# Patient Record
Sex: Female | Born: 1935
Health system: Southern US, Community
[De-identification: ages and names within clinical notes are randomized; demographics above are authoritative.]

## PROBLEM LIST (undated history)

## (undated) DIAGNOSIS — R011 Cardiac murmur, unspecified: Secondary | ICD-10-CM

## (undated) DIAGNOSIS — B019 Varicella without complication: Secondary | ICD-10-CM

## (undated) DIAGNOSIS — K269 Duodenal ulcer, unspecified as acute or chronic, without hemorrhage or perforation: Secondary | ICD-10-CM

## (undated) DIAGNOSIS — D5912 Cold autoimmune hemolytic anemia: Secondary | ICD-10-CM

## (undated) DIAGNOSIS — M1990 Unspecified osteoarthritis, unspecified site: Secondary | ICD-10-CM

## (undated) DIAGNOSIS — R9389 Abnormal findings on diagnostic imaging of other specified body structures: Secondary | ICD-10-CM

## (undated) DIAGNOSIS — D649 Anemia, unspecified: Secondary | ICD-10-CM

## (undated) DIAGNOSIS — E7800 Pure hypercholesterolemia, unspecified: Secondary | ICD-10-CM

## (undated) DIAGNOSIS — K2981 Duodenitis with bleeding: Secondary | ICD-10-CM

## (undated) DIAGNOSIS — T4145XA Adverse effect of unspecified anesthetic, initial encounter: Secondary | ICD-10-CM

## (undated) DIAGNOSIS — K219 Gastro-esophageal reflux disease without esophagitis: Secondary | ICD-10-CM

## (undated) DIAGNOSIS — J189 Pneumonia, unspecified organism: Secondary | ICD-10-CM

## (undated) DIAGNOSIS — R0600 Dyspnea, unspecified: Secondary | ICD-10-CM

## (undated) DIAGNOSIS — I10 Essential (primary) hypertension: Secondary | ICD-10-CM

## (undated) HISTORY — PX: ABDOMINAL HYSTERECTOMY: SHX81

## (undated) HISTORY — PX: JOINT REPLACEMENT: SHX530

## (undated) HISTORY — DX: Unspecified osteoarthritis, unspecified site: M19.90

## (undated) HISTORY — PX: BACK SURGERY: SHX140

## (undated) HISTORY — DX: Abnormal findings on diagnostic imaging of other specified body structures: R93.89

## (undated) HISTORY — PX: ROTATOR CUFF REPAIR: SHX139

## (undated) HISTORY — DX: Varicella without complication: B01.9

## (undated) HISTORY — PX: TOTAL HIP ARTHROPLASTY: SHX124

## (undated) HISTORY — DX: Cold autoimmune hemolytic anemia: D59.12

## (undated) HISTORY — DX: Pure hypercholesterolemia, unspecified: E78.00

## (undated) HISTORY — DX: Essential (primary) hypertension: I10

---
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
## 2001-10-08 ENCOUNTER — Encounter: Payer: Self-pay | Admitting: Neurosurgery

## 2001-10-08 ENCOUNTER — Ambulatory Visit (HOSPITAL_COMMUNITY): Admission: RE | Admit: 2001-10-08 | Discharge: 2001-10-08 | Payer: Self-pay | Admitting: Neurosurgery

## 2001-11-29 ENCOUNTER — Encounter: Payer: Self-pay | Admitting: Neurosurgery

## 2001-11-29 ENCOUNTER — Encounter: Admission: RE | Admit: 2001-11-29 | Discharge: 2001-11-29 | Payer: Self-pay | Admitting: Neurosurgery

## 2001-12-10 ENCOUNTER — Encounter: Payer: Self-pay | Admitting: Neurosurgery

## 2001-12-15 ENCOUNTER — Ambulatory Visit (HOSPITAL_COMMUNITY): Admission: RE | Admit: 2001-12-15 | Discharge: 2001-12-16 | Payer: Self-pay | Admitting: Neurosurgery

## 2001-12-15 ENCOUNTER — Encounter: Payer: Self-pay | Admitting: Neurosurgery

## 2002-04-05 ENCOUNTER — Encounter: Payer: Self-pay | Admitting: Orthopedic Surgery

## 2002-04-05 ENCOUNTER — Encounter: Admission: RE | Admit: 2002-04-05 | Discharge: 2002-04-05 | Payer: Self-pay | Admitting: Orthopedic Surgery

## 2002-05-26 ENCOUNTER — Encounter: Admission: RE | Admit: 2002-05-26 | Discharge: 2002-05-26 | Payer: Self-pay | Admitting: Internal Medicine

## 2002-05-26 ENCOUNTER — Encounter: Payer: Self-pay | Admitting: Internal Medicine

## 2002-07-12 ENCOUNTER — Encounter: Admission: RE | Admit: 2002-07-12 | Discharge: 2002-07-12 | Payer: Self-pay | Admitting: Orthopedic Surgery

## 2002-07-12 ENCOUNTER — Encounter: Payer: Self-pay | Admitting: Orthopedic Surgery

## 2002-09-05 ENCOUNTER — Encounter: Payer: Self-pay | Admitting: Orthopedic Surgery

## 2002-09-07 ENCOUNTER — Inpatient Hospital Stay (HOSPITAL_COMMUNITY): Admission: RE | Admit: 2002-09-07 | Discharge: 2002-09-10 | Payer: Self-pay | Admitting: Orthopedic Surgery

## 2002-09-08 ENCOUNTER — Encounter: Payer: Self-pay | Admitting: Orthopedic Surgery

## 2003-01-12 ENCOUNTER — Ambulatory Visit (HOSPITAL_BASED_OUTPATIENT_CLINIC_OR_DEPARTMENT_OTHER): Admission: RE | Admit: 2003-01-12 | Discharge: 2003-01-12 | Payer: Self-pay | Admitting: Orthopedic Surgery

## 2003-02-10 ENCOUNTER — Ambulatory Visit (HOSPITAL_BASED_OUTPATIENT_CLINIC_OR_DEPARTMENT_OTHER): Admission: RE | Admit: 2003-02-10 | Discharge: 2003-02-10 | Payer: Self-pay | Admitting: Orthopedic Surgery

## 2004-06-06 ENCOUNTER — Other Ambulatory Visit: Admission: RE | Admit: 2004-06-06 | Discharge: 2004-06-06 | Payer: Self-pay | Admitting: Internal Medicine

## 2004-09-03 ENCOUNTER — Encounter: Admission: RE | Admit: 2004-09-03 | Discharge: 2004-09-03 | Payer: Self-pay | Admitting: Internal Medicine

## 2004-10-14 ENCOUNTER — Inpatient Hospital Stay (HOSPITAL_COMMUNITY): Admission: RE | Admit: 2004-10-14 | Discharge: 2004-10-17 | Payer: Self-pay | Admitting: Orthopedic Surgery

## 2006-07-09 ENCOUNTER — Encounter: Admission: RE | Admit: 2006-07-09 | Discharge: 2006-07-09 | Payer: Self-pay | Admitting: Internal Medicine

## 2008-07-14 ENCOUNTER — Encounter: Admission: RE | Admit: 2008-07-14 | Discharge: 2008-07-14 | Payer: Self-pay | Admitting: Internal Medicine

## 2008-09-01 LAB — HM COLONOSCOPY

## 2009-08-10 ENCOUNTER — Encounter: Admission: RE | Admit: 2009-08-10 | Discharge: 2009-08-10 | Payer: Self-pay | Admitting: Internal Medicine

## 2009-09-01 HISTORY — PX: CATARACT EXTRACTION W/ INTRAOCULAR LENS  IMPLANT, BILATERAL: SHX1307

## 2010-08-01 LAB — HM MAMMOGRAPHY: HM Mammogram: NORMAL

## 2010-09-09 ENCOUNTER — Encounter
Admission: RE | Admit: 2010-09-09 | Discharge: 2010-09-09 | Payer: Self-pay | Source: Home / Self Care | Attending: Internal Medicine | Admitting: Internal Medicine

## 2010-09-26 LAB — HM MAMMOGRAPHY: HM Mammogram: NORMAL

## 2010-11-10 ENCOUNTER — Emergency Department: Payer: Self-pay | Admitting: Emergency Medicine

## 2010-12-19 ENCOUNTER — Other Ambulatory Visit (HOSPITAL_COMMUNITY): Payer: Self-pay

## 2010-12-26 ENCOUNTER — Inpatient Hospital Stay (HOSPITAL_COMMUNITY): Admission: RE | Admit: 2010-12-26 | Payer: Medicare Other | Source: Ambulatory Visit | Admitting: Neurosurgery

## 2011-01-17 NOTE — Op Note (Signed)
Robin Alexander, Robin Alexander                          ACCOUNT NO.:  [id number]   MEDICAL RECORD NO.:  [id number]                   PATIENT TYPE:  INP   LOCATION:  5028                                 FACILITY:  [facility]   PHYSICIAN:  Robert A. Thurston Hole, M.D.              DATE OF BIRTH:  1933/08/08   DATE OF PROCEDURE:  09/07/2002  DATE OF DISCHARGE:  09/10/2002                                 OPERATIVE REPORT   PREOPERATIVE DIAGNOSIS:  Right hip degenerative joint disease.   POSTOPERATIVE DIAGNOSIS:  Right hip degenerative joint disease.   PROCEDURE:  Right total hip replacement using Osteonix total hip system with  48-mm press-fit TSL acetabulum with 10 degree polyethylene liner and 2  locking screws. Femoral component #5 cemented Eon Plus cemented component  with plus 0 x 28 mm femoral head and #3 cement plug.   SURGEON:  Elana Alm. Thurston Hole, M.D.   ASSISTANT:  Julien Girt, P.A.-C.   ANESTHESIA:  General.   OPERATIVE TIME:  1-1/2 hours.   COMPLICATIONS:  None.   ESTIMATED BLOOD LOSS:  250 cc.   DESCRIPTION OF PROCEDURE:  The patient was brought  to the operating room on  September 07, 2002, and placed in the supine position on the operating table.  After an adequate level of general anesthesia was obtained she had a Foley  catheter placed under sterile conditions and received Ancef 1 gm IV  perioperatively for prophylaxis.   Her right hip was examined under anesthesia. She had flexion to 90,  extension to 0, internal and external rotation of 30 degrees. She had  approximately 1-cm shortening of the right leg compared to the left.   She was then turned in the right lateral up decubitus position and secured  on the bed with a Mark frame. Her right hip and leg were prepped using  sterile Duraprep and draped using sterile technique.   Originally through a 20-cm posterolateral greater trochanteric incision  initial exposure was made. The underlying subcutaneous tissues were  incised  in line with the skin incision. The iliotibial band and gluteus maximus  fascia was incised in line with the skin incision as well, revealing the  underlying sciatic nerve which was carefully protected. The short external  rotators of the hip and hip capsule were released off the femoral neck  insertion and tagged and the hip was posteriorly dislocated. The articular  surface on the femoral head was inspected and found to have grade 3 and 4  changes.   A 1.5 to 2-cm saw cut was then made above the lesser trochanter and the  appropriate amount of abduction and anteversion in the femoral head was  removed. Carefully retractors were placed around the acetabulum and  degenerative labrum was removed from around the acetabulum. The acetabulum  showed grade 3 and grade 4 chondromalacia as well.   At this point the end sequential acetabular reamers were used  to ream up to  a 48-mm size, and with a 48-mm trial and the appropriate amount of  anteversion and inclination there was found to be excellent press fit of  this. The trial was then removed and then the actual 48-mm TSL press-fit  acetabulum was hammered into position with the appropriate amount of  abduction and anteversion. It was further secured in place with 2 locking  screws, one 15 mm and one 20 mm in the 11 and 12 o'clock positions. A 10  degree polyethylene liner was then placed with the 10 degree lip in the  posterolateral position and hammered into position.   After this was done the proximal femur was exposed. The  sequential axial  reamers were used to ream up to a #5 size followed by broaches to a #5 size  and with the #5 broach in place and a +0 x 28 mm trial femoral head and  neck, the hip was reduced and taken through a range of motion and found to  be stable in 90 degrees of flexion up to 70 degrees of internal rotation as  well as in 30 degrees of adduction and also stable in abduction and external  rotation and  leg lengths were found to be equal. The trial component was  then removed. The femoral canal was measured for a cement plug and a #3  cement plug was found to be the satisfactory size and this was placed.   The femoral canal was then jet lavaged and irrigated with 3 liters of saline  solution. It was then filled with bone cement and then the #5 Eon stem was  placed down the femoral canal with an excellent fit with excess cement being  removed from around the edges.   After the cement hardened the +0 x 28 mm femoral head was hammered on the  femoral neck with an excellent Morse taper fit and then the hip was reduced.  It was found to be stable, leg length equal.   At this point it was felt that all the components were of excellent size,  fit and stability. The joint was further irrigated with antibiotic solution  and then the short external rotator of the hip and hip capsule were then  reattached to their femoral neck insertion through 2 drill holes in the  greater trochanter.   After this was done then the iliotibial band and gluteus maximus fascia was  closed with #1 Ethibond suture. The subcutaneous tissues were closed with 0  and 2-0 Vicryl. The skin was closed with skin staples. Sterile dressings  were applied.   A hip abduction pillow was placed. The patient was  turned supine, awakened,  extubated and taken to the recovery room in stable condition. Needle and  sponge counts were correct x2 at the end of the case. Leg lengths were  checked and these were found to be equal, rotation equal. Neurovascular  status was normal.                                               Molly Maduro A. Thurston Hole, M.D.    RAW/MEDQ  D:  11/21/2002  T:  11/21/2002  Job:  [id number]

## 2011-01-17 NOTE — Op Note (Signed)
[place]  Patient:    Robin Alexander, Robin Alexander Visit Number: [id number] MRN: [id number]          Service Type: DSU Location: [id number] Attending Physician:  Gerald Dexter Dictated by:   Reinaldo Meeker, M.D. Proc. Date: 12/15/01 Admit Date:  12/15/2001                             Operative Report  PREOPERATIVE DIAGNOSIS:  Spondylosis L4-5, bilateral.  POSTOPERATIVE DIAGNOSIS:  Spondylosis L4-5, bilateral.  OPERATION PERFORMED:  Bilateral L4-5 intralaminar laminotomies for decompression of L5 nerve roots bilaterally with the operating microscope.  SECONDARY PROCEDURE:  Microdissection of 5 nerve roots and L4-5 disk bilaterally.  SURGEON:  Reinaldo Meeker, M.D.  ASSISTANT:  Julio Sicks, M.D.  ANESTHESIA:  General.  DESCRIPTION OF PROCEDURE:  After being placed in the prone position, the patients back was prepped with Betadine and draped in the usual sterile fashion.  A localizing x-ray was taken prior to incision to identify the appropriate level.  A midline incision was made above the spinous process of L4 and L5.  Using Bovie cutting current, the incision was carried down to the spinous processes.  Subperiosteal dissection was then carried out along the spinous processes and lamina and the McCullough self-retaining retractor was placed for exposure.  A second x-ray showed approach to the appropriate level. Starting on the patients left side, a high speed drill was used to perform a laminotomy and by removing the inferior one half of the L4 lamina and medial one third of the facet joint and the superior one half of the L5 lamina. Residual bone and hypertrophic ligamentum flavum were removed in a piecemeal fashion.  Similar procedure was carried out on the patients right side once again removing the inferior one half of the L4 lamina and medial one third of the facet joint and the superior one half of the L5 lamina.  Once  again residual ligamentum flavum were removed in a piecemeal fashion.  The microscope was draped at this time and used for this part of the case.  Using microdissection technique starting on the patients right side, the lateral aspect of the thecal sac and L5 nerve root were identified.  This was identified and found not to be herniated or even bulging enough to be causing any compression.   The nerve root was followed further out its foramen to complete the decompression.  Similar evaluation on the patients left side once gain yielded no evidence of significant disk pathology.  Once again, the nerve root was followed out its foramen until it was thoroughly decompressed. At this point inspection in all directions for any evidence of residual compression and none could be identified.  Large amounts of irrigation were carried out.  Any bleeding was controlled with bipolar coagulation and Gelfoam.  The wound was then closed using interrupted Vicryl on the muscle, fascia, subcutaneous and subcuticular tissues, staples on the skin.  Sterile dressing was then applied and then the patient was extubated and taken to the recovery room in stable condition. Dictated by:   Reinaldo Meeker, M.D. Attending Physician:  Gerald Dexter DD:  12/15/01 TD:  12/15/01 Job: [id number]

## 2011-01-17 NOTE — Op Note (Signed)
[name], Robin Alexander                          ACCOUNT NO.:  [id number]   MEDICAL RECORD NO.:  [id number]                   PATIENT TYPE:  AMB   LOCATION:  DSC                                  FACILITY:  [facility]   PHYSICIAN:  Cindee Salt, M.D.                    DATE OF BIRTH:  [birth year]   DATE OF PROCEDURE:  01/12/2003  DATE OF DISCHARGE:                                 OPERATIVE REPORT   PREOPERATIVE DIAGNOSIS:  Carpal tunnel syndrome right hand.   POSTOPERATIVE DIAGNOSIS:  Carpal tunnel syndrome right hand.   OPERATION:  Decompression right median nerve.   SURGEON:  Cindee Salt, M.D.   ASSISTANT:  Carolyne Fiscal, R.N.   ANESTHESIA:  IV regional general.   INDICATIONS:  The patient is a 77 year old female with a history of carpal  tunnel syndrome.  EMG nerve conduction is positive and has not responded to  conservative treatment.   DESCRIPTION OF PROCEDURE:  The patient was brought to the operating room.  Forearm-based IV regional anesthetic was carried out without difficulty.  She had continued blood flow.  It was decided to proceed with a LMA.  This  was performed with her in the supine position.  She was prepped using  DuraPrep.  A tourniquet on the forearm was removed.  An upper arm tourniquet  was placed.  Petechia had formed.   The limb was exsanguinated with an Esmarch bandage.  Tourniquet placed high  and the arm was inflated to 250 mmHg.  A straight incision was made  longitudinally in the palm and carried down through subcutaneous tissue.  Bleeders were electrocauterized.  Palmar fascia was split.  The superficial  palmar arch was identified.  Flexor tendon to the ring and middle finger  identified to the ulnar side of the median nerve.  Carpal retinaculum was  incised with sharp dissection.  A right angle and Sewell retractor was  placed between the skin and forearm fascia.  The fascia was released.  Full  visualization was not possible as the Sewell retractor was removed  and two  right angle retractors were placed and the volar forearm fascia was released  without difficulty for about 3 cm proximal to the wrist crease.  It was then  explored and was found to be extremely hyperemic.  An hour-glass deformity  was present. No further lesions were identified.  The wound was irrigated.  The skin was closed with interrupted 5-0 nylon sutures.  Sterile compressive  dressing and splint was applied.  The patient tolerated the procedure well.  She was discharged home to return to the [facility] [place] in four  days on Vicodin and Keflex.  Cindee Salt, M.D.    GK/MEDQ  D:  01/12/2003  T:  01/12/2003  Job:  [id number]

## 2011-01-17 NOTE — Op Note (Signed)
NAMEMYRLE, WANEK              ACCOUNT NO.:  [id number]   MEDICAL RECORD NO.:  [id number]          PATIENT TYPE:  INP   LOCATION:  2899                         FACILITY:  [facility]   PHYSICIAN:  Elana Alm. Thurston Hole, M.D. DATE OF BIRTH:  March 29, 1933   DATE OF PROCEDURE:  10/14/2004  DATE OF DISCHARGE:                                 OPERATIVE REPORT   PREOPERATIVE DIAGNOSIS:  Left hip degenerative joint disease.   POSTOPERATIVE DIAGNOSIS:  Left hip degenerative joint disease.   PROCEDURE:  Left total hip replacement using Osteonics total hip set system  with acetabulum 46 mm PSL  Press-Fit acetabular with two locking screws and  10 degree polyethylene liner. Femoral component #5 HFX cemented femoral  component with -3 x 28 mm femoral head with 11 mm centralizer and #2 cement  plug.   SURGEON:  Elana Alm. Thurston Hole, M.D.   ASSISTANT:  Julien Girt, P.A.   ANESTHESIA:  General.   OPERATIVE TIME:  1 hour and 15 minutes.   ESTIMATED BLOOD LOSS:  300 mL.   COMPLICATIONS:  None.   DESCRIPTION OF PROCEDURE:  Ms. Sol was brought to operating room on  October 14, 2004, placed operative table in supine position. She received  Ancef 1 g IV preoperatively for prophylaxis. After being placed under  general anesthesia her left hip was examined. She had flexion to 90,  extension to 0, internal and external rotation of 20 degrees with  approximately one and one and a half inches of shortening in the left leg  compared to the right. She had a Foley catheter placed under sterile  conditions and was then turned in the left lateral decubitus position and  secured on the bed with the MARK frame. Her left hip and leg was then  prepped using sterile DuraPrep and draped using sterile technique.  Originally through a 15 cm posterolateral greater trochanteric incision  initial exposure was made.  The underlying subcutaneous tissues were incised  along with skin incision. The iliotibial band and  gluteus maximus fascia was  incised longitudinally revealing the underlying sciatic nerve which was  carefully protected. The short external rotators of the hip and hip capsule  were carefully released off the femoral neck insertion intact and then the  hip was posteriorly dislocated. She was found have grade III and IV DJD of  the femoral head. Femoral neck cut was made 1.5 cm above the lesser  trochanter and the appropriate amount of abduction and inclination and  anteversion. The acetabulum was then carefully exposed and found to have  grade III and IV DJD as well. Degenerative acetabular labral tissue was  removed and then sequential reaming was used up to a  46 mm size reaming  acetabulum in the appropriate manner anteversion, abduction and inclination.  A 46 mm trial was then placed found to be an excellent fit. It was then  removed and the actual 46 mm PSL acetabulum was then hammered into position  with the appropriate amount of anteversion, abduction and inclination. It  was further secured in place with two separate 20 mm locking screws, one  in  the 12 o'clock and one in the one o'clock position. After this was done,  then a 10 degree polyethylene liner was hammered into position with an  excellent fit with the 10 degree lip in the posterolateral position. At this  point the proximal femur was exposed. Sequential axial reamers were used to  ream up to #5 size followed by broaches to a #5 with a #5 broach in place  and a -3 x 28 mm femoral head. There was found to be excellent restoration  of normal alignment and normal leg lengths and excellent stability. The hip  was stable up to 70 to 80 degrees of internal rotation in both neutral and  30 degrees of adduction and also stable in abduction and external rotation  and leg lengths were equal.. At this point the femoral trials were then  removed. The cement plug was measured.  The #2 was found be the appropriate  size of the #2  cement plug was placed. The femoral canal was then jet lavage  irrigated with saline and then filled with bone cement in then the actual #5  HFX stem with 11 mm centralizer was then placed down the femoral canal and  hammered into position with an excellent fit with excess cement being  removed from around the edges. After the cement hardened, a -3 x 28 mm  femoral head was placed. The hip reduced, taken through a full range of  motion, found to be stable and leg lengths equal. At this point it was felt  that all components were of excellent size, fit and stability. The wound was  further irrigated with saline and then the short external rotators of the  hip and hip capsule reattached to the femoral neck insertion through two  drill holes on the greater trochanter. The iliotibial band and gluteus  maximus fascia was closed with #1 Ethibond sutures. Subcutaneous tissues  closed with 0 and 2-0 Vicryl, skin closed with skin staples. Sterile  dressings were applied. Hip abduction pillow placed. The patient then turned  supine,  checked for leg lengths equal.  Rotation equal pulses 2+ of  symmetric.  She was awakened, extubated, taken to recovery in stable  condition.  Needle and sponge counts correct x2 at the end of the case.      RAW/MEDQ  D:  10/14/2004  T:  10/14/2004  Job:  [id number]

## 2011-01-17 NOTE — Discharge Summary (Signed)
   NAMEHALSEY, Robin Alexander                          ACCOUNT NO.:  [id number]   MEDICAL RECORD NO.:  [id number]                   PATIENT TYPE:  INP   LOCATION:  5028                                 FACILITY:  [facility]   PHYSICIAN:  Robert A. Thurston Hole, M.D.              DATE OF BIRTH:  February 14, 1933   DATE OF ADMISSION:  09/07/2002  DATE OF DISCHARGE:  09/10/2002                                 DISCHARGE SUMMARY   ADMITTING DIAGNOSES:  1. Right hip degenerative joint disease.  2. Chronic anemia.  3. Hypertension.   DISCHARGE DIAGNOSES:  Right hip DJD, chronic anemia, hypertension.   HISTORY OF PRESENT ILLNESS:  The patient is a 76 year old female with a long-  standing history of right hip pain.  She has tried conservative treatment,  all without success.  She has pain at night, pain with rest, pain with  activity, unrelieved by conservative treatments.  She was told the options,  risks, benefits, and possible complications of her right total hip  replacement.   On 09/07/02, the patient underwent a right total hip by Dr. Thurston Hole.  She  tolerated the procedure well.  She was admitted postoperatively for physical  therapy, DVT prophylaxis, and pain control.   On postoperative day #1, the patient had low urinary output and was slightly  hypotensive at 101/41.  Her blood pressure medication was held.  She was  given a 500 cc bolus of normal saline.  Her hemoglobin was 8.2.  She was  transfused two units of packed red blood cells.   On postoperative day #2, she had some difficulty with spasm.  Temperature  was 101.2.  The surgical wound was well approximated.  Her hemoglobin was up  to 10.1.  She progressed better with physical therapy.   On postoperative day #3, the patient has done very well.  She has no  complaints of pain.  Occasional spasm.  The T-max was 98.4.  The surgical  wound was well approximated.  Her hemoglobin was 9.8.  Her INR was 1.4, and  she was metabolically stable.  She was  discharged to home in stable  condition, touchdown weightbearing.  Will see her back in the office in 7-10  days.   DIET:  Regular.   DISCHARGE MEDICATIONS:  1. Percocet 5/325, 1-2 q.4-6h. p.r.n. pain.  2. Robaxin 500 mg one q.4-6h. p.r.n. spasm.  3. Coumadin 2.5 mg one tablet daily.  4. Tequin 400 mg one p.o. daily x5 days.    FOLLOW UP:  She will follow up in the office in 7-10 days.  Call us with  temperature greater than 101, increased pain, increased drainage.     Kirstin Shepperson, P.A.                  Robert A. Thurston Hole, M.D.    KS/MEDQ  D:  11/16/2002  T:  11/18/2002  Job:  [id number]

## 2011-01-17 NOTE — Op Note (Signed)
   NAMELINDIA, GARMS                          ACCOUNT NO.:  [id number]   MEDICAL RECORD NO.:  [id number]                   PATIENT TYPE:  HAMB   LOCATION:                                       FACILITY:  [facility]   PHYSICIAN:  Cindee Salt, M.D.                    DATE OF BIRTH:  [birth year]   DATE OF PROCEDURE:  02/10/2003  DATE OF DISCHARGE:  02/10/2003                                 OPERATIVE REPORT   PREOPERATIVE DIAGNOSIS:  Left carpal tunnel.   POSTOPERATIVE DIAGNOSIS:  Left carpal tunnel.   PROCEDURE:  Decompression left median nerve.   SURGEON:  Cindee Salt, M.D.   ANESTHESIA:  Forearm based IV regional.   HISTORY:  The patient is a 77 year old female with a history of carpal  tunnel syndrome.  EMG nerve conduction is positive to the left arm not  responsive to conservative treatments.   DESCRIPTION OF PROCEDURE:  The patient was brought to the operating room and  forearm based IV regional anesthetic was carried out without difficulty.  She was prepped and draped using duraprep in the supine position with the  left arm free.  A longitudinal incision was made and promptly carried down  through the subcutaneous tissue.  Bleeders were electrocauterized, palmar  fascia was split, superficial palmar arch identified.  Flexor tendon of the  ring and middle finger were identified.  To the ulnar side of the median  nerve the carpal retinaculum was incised with sharp dissection.  Right angle  and __________ retractor between the skin and forearm fascia.  The fascia  was released and approximately 3 cm proximal to the wrist released under  direct vision.   The canal was explored, the synovial was markedly thickened.  No further  lesions were identified.  The motor branch was noted to be tented over  retinaculum.  This portion of the retinaculum was incised.  The wound was  irrigated.  The skin was closed with interrupted 5-0 Nylon sutures, sterile  compressive dressing and splint  was applied.  The patient tolerated the  procedure well and was taken to the recovery room for observation in  satisfactory condition.  She is discharged home.  She is to return to the  [facility] [place] in 1 week.  I would like to see her on Keflex.                                               Cindee Salt, M.D.    GK/MEDQ  D:  02/10/2003  T:  02/10/2003  Job:  [id number]

## 2011-01-17 NOTE — Discharge Summary (Signed)
Robin Alexander, Robin Alexander              ACCOUNT NO.:  [id number]   MEDICAL RECORD NO.:  [id number]          PATIENT TYPE:  INP   LOCATION:  5030                         FACILITY:  [facility]   PHYSICIAN:  Kirstin Shepperson, P.A.DATE OF BIRTH:  05-03-1933   DATE OF ADMISSION:  10/14/2004  DATE OF DISCHARGE:  10/17/2004                                 DISCHARGE SUMMARY   ADMISSION DIAGNOSES:  1.  End-stage degenerative joint disease left hip.  2.  Hypertension.  3.  High cholesterol.   DISCHARGE DIAGNOSES:  1.  End-stage degenerative joint disease left hip.  2.  Hypertension.  3.  High cholesterol.  4.  History of tobacco use.   HISTORY OF PRESENT ILLNESS:  The patient is a 77 year old white female with  a history of end-stage DJD of both hips. She had a right total hip  replacement in years past and has done well with this, comes in today with  end-stage DJD of her left hip that has failed conservative care including  antiinflammatories and articular cortisone injection. She understands the  risks, benefits and possible complications of a left total hip replacement  and is without question.   PROCEDURE:  On October 14, 2004, the patient underwent a left total hip  replacement  by Dr. Thurston Hole. She tolerated the procedure well.   Postoperative day one, she was weightbearing as tolerated, her surgical  wound was well approximated, she had a positive UA so it was repeated. Tmax  of 101.8, hemoglobin of 9.1. The surgical wound was well approximated.  Postoperative day two, patient doing well, up to the bathroom without  difficulty. Repeat UA was negative. Hemoglobin was 8.6, INR was 1.7. She was  metabolically stable except a sodium of 134. No shortness of breath, no  chest pain. She is progressing well with physical therapy. Postoperative day  three, patient doing well, no complaints. She progressed well with a rolling  walker. Her temperature was 100.4, hemoglobin was 8.4, her INR was  1.9. She  was metabolically stable, she had no shortness of breath, no chest pain, no  palpitations secondary to her low hemoglobin. She was discharged to home in  stable condition. Weightbearing as tolerated. She ambulated 180 feet.   DISCHARGE MEDICATIONS:  1.  Percocet 1-2 q.4-6 h p.r.n. pain.  2.  Robaxin 1 q.4-6 h p.r.n. muscle spasm.  3.  Avapro 150 mg 1 p.o. q.d.  4.  Vytoran 10/20, 1 p.o. q.d.  5.  Calcium 1200 mg a day.  6.  Zantac 150 mg, 1 tablet twice a day.  7.  Nu-Iron 150 mg, 1 tablet a day.  8.  Aspirin 81 mg, 1 tablet once a day.  9.  Coumadin 2.5 mg daily.  10. Colace 100 mg, 1 tablet twice a day.  11. Senokot S, 2 tablets before dinner.  12. Milk of magnesia 60 mL.   When she gets home, she has been instructed to keep her wound clean and dry  and change her dressing daily. She will call with increased pain, increased  redness, increased swelling, increased drainage or a temperature greater  than 101. She will followup with Dr. Thurston Hole on October 28, 2004.      KS/MEDQ  D:  01/08/2005  T:  01/08/2005  Job:  [id number]

## 2011-07-28 ENCOUNTER — Ambulatory Visit: Payer: BC Managed Care – PPO | Admitting: Internal Medicine

## 2011-07-31 ENCOUNTER — Ambulatory Visit (INDEPENDENT_AMBULATORY_CARE_PROVIDER_SITE_OTHER): Payer: Medicare Other | Admitting: Internal Medicine

## 2011-07-31 ENCOUNTER — Other Ambulatory Visit: Payer: Self-pay | Admitting: *Deleted

## 2011-07-31 ENCOUNTER — Encounter: Payer: Self-pay | Admitting: Internal Medicine

## 2011-07-31 VITALS — BP 170/62 | HR 85 | Temp 97.9°F | Wt 153.0 lb

## 2011-07-31 DIAGNOSIS — Z780 Asymptomatic menopausal state: Secondary | ICD-10-CM

## 2011-07-31 DIAGNOSIS — Z1231 Encounter for screening mammogram for malignant neoplasm of breast: Secondary | ICD-10-CM

## 2011-07-31 DIAGNOSIS — Z23 Encounter for immunization: Secondary | ICD-10-CM

## 2011-07-31 DIAGNOSIS — K219 Gastro-esophageal reflux disease without esophagitis: Secondary | ICD-10-CM

## 2011-07-31 DIAGNOSIS — I10 Essential (primary) hypertension: Secondary | ICD-10-CM

## 2011-07-31 DIAGNOSIS — E785 Hyperlipidemia, unspecified: Secondary | ICD-10-CM

## 2011-07-31 MED ORDER — BENAZEPRIL HCL 20 MG PO TABS: 20.0000 mg | ORAL_TABLET | Freq: Every day | ORAL | Status: DC

## 2011-07-31 MED ORDER — SIMVASTATIN 20 MG PO TABS: 20.0000 mg | ORAL_TABLET | Freq: Every day | ORAL | Status: DC

## 2011-07-31 MED ORDER — OMEPRAZOLE 40 MG PO CPDR: 40.0000 mg | DELAYED_RELEASE_CAPSULE | Freq: Every day | ORAL | Status: DC

## 2011-07-31 NOTE — Progress Notes (Signed)
Subjective:    Patient ID: Robin Alexander, female    DOB: 17-Apr-1933, 77 y.o.   MRN: [id number]  HPI 77YO female with h/o hypertension and hyperlipidemia presents to establish care. Notes that BP is generally well controlled at home.  Reports full compliance with meds. No chest pain, palpitations, headache. Reports recent renal function was normal. Reports full compliance with simvastatin. Denies any issues with myalgia.  Only complaint today is mid-epigastric pain secondary to GERD.  Has been taking zantac with no improvement. No other abdominal pain, no bloody stools or black/tarry stools. Has never had upper endoscopy.  Outpatient Encounter Prescriptions as of 07/31/2011  Medication Sig Dispense Refill  . acetaminophen (TYLENOL) 500 MG tablet Take 1,000 mg by mouth 2 (two) times daily.        Marland Kitchen aspirin EC 81 MG tablet Take 81 mg by mouth daily.        . benazepril (LOTENSIN) 20 MG tablet Take 1 tablet (20 mg total) by mouth daily.  90 tablet  1  . Calcium-Vitamin D 600-200 MG-UNIT per tablet Take 2 tablets by mouth daily.        . cholecalciferol (VITAMIN D) 1000 UNITS tablet Take 1,000 Units by mouth daily.        . diphenhydrAMINE (BENADRYL) 25 MG tablet Take 25 mg by mouth daily.        . fish oil-omega-3 fatty acids 1000 MG capsule Take 1,200 mg by mouth 2 (two) times daily.        . Multiple Vitamin (MULTIVITAMIN) capsule Take 1 capsule by mouth daily.        . ranitidine (ZANTAC) 150 MG capsule Take 150 mg by mouth 2 (two) times daily.        . simvastatin (ZOCOR) 20 MG tablet Take 1 tablet (20 mg total) by mouth at bedtime.  90 tablet  1  . vitamin C (ASCORBIC ACID) 500 MG tablet Take 500 mg by mouth daily.          Review of Systems  Constitutional: Negative for fever, chills, appetite change, fatigue and unexpected weight change.  HENT: Negative for ear pain, congestion, sore throat, trouble swallowing, neck pain, voice change and sinus pressure.   Eyes: Negative for visual  disturbance.  Respiratory: Negative for cough, shortness of breath, wheezing and stridor.   Cardiovascular: Negative for chest pain, palpitations and leg swelling.  Gastrointestinal: Positive for abdominal pain. Negative for nausea, vomiting, diarrhea, constipation, blood in stool, abdominal distention and anal bleeding.  Genitourinary: Negative for dysuria and flank pain.  Musculoskeletal: Negative for myalgias, arthralgias and gait problem.  Skin: Negative for color change and rash.  Neurological: Negative for dizziness and headaches.  Hematological: Negative for adenopathy. Does not bruise/bleed easily.  Psychiatric/Behavioral: Negative for suicidal ideas, sleep disturbance and dysphoric mood. The patient is not nervous/anxious.    BP 170/62  Pulse 85  Temp(Src) 97.9 F (36.6 C) (Oral)  Wt 153 lb (69.4 kg)  SpO2 95% Recheck 148/66    Objective:   Physical Exam  Constitutional: She is oriented to person, place, and time. She appears well-developed and well-nourished. No distress.  HENT:  Head: Normocephalic and atraumatic.  Right Ear: External ear normal.  Left Ear: External ear normal.  Nose: Nose normal.  Mouth/Throat: Oropharynx is clear and moist. No oropharyngeal exudate.  Eyes: Conjunctivae are normal. Pupils are equal, round, and reactive to light. Right eye exhibits no discharge. Left eye exhibits no discharge. No scleral icterus.  Neck: Normal  range of motion. Neck supple. No tracheal deviation present. No thyromegaly present.  Cardiovascular: Normal rate, regular rhythm, normal heart sounds and intact distal pulses.  Exam reveals no gallop and no friction rub.   No murmur heard. Pulmonary/Chest: Effort normal and breath sounds normal. No respiratory distress. She has no wheezes. She has no rales. She exhibits no tenderness.  Musculoskeletal: Normal range of motion. She exhibits no edema and no tenderness.  Lymphadenopathy:    She has no cervical adenopathy.    Neurological: She is alert and oriented to person, place, and time. No cranial nerve deficit. She exhibits normal muscle tone. Coordination normal.  Skin: Skin is warm and dry. No rash noted. She is not diaphoretic. No erythema. No pallor.  Psychiatric: She has a normal mood and affect. Her behavior is normal. Judgment and thought content normal.          Assessment & Plan:  1. GERD - Symptoms not controlled on Zantac. Will try changing to Prilosec.  If no improvement, will send testing for H. Pylori and set up endoscopy. Follow up 1 month.  2. Hypertension - BP fairly well controlled (on recheck) by pt report. Will continue current meds. Will review recent notes and labs from pt previous physician.  3. Hyperlipidemia - Will check recent LFTs and lipids which were drawn by previous physician. Will continue simvastatin.  4. Health maintenance - Will order mammogram and bone density testing. Flu shot today.

## 2011-08-04 NOTE — Progress Notes (Signed)
Addended by: Vernie Murders on: 08/04/2011 10:18 AM   Modules accepted: Orders

## 2011-09-10 ENCOUNTER — Ambulatory Visit: Payer: Medicare Other | Admitting: Internal Medicine

## 2011-09-25 ENCOUNTER — Encounter: Payer: Self-pay | Admitting: Internal Medicine

## 2011-09-25 ENCOUNTER — Ambulatory Visit (INDEPENDENT_AMBULATORY_CARE_PROVIDER_SITE_OTHER): Payer: Medicare Other | Admitting: Internal Medicine

## 2011-09-25 VITALS — BP 132/60 | HR 72 | Temp 97.7°F | Ht 60.0 in | Wt 152.0 lb

## 2011-09-25 DIAGNOSIS — J40 Bronchitis, not specified as acute or chronic: Secondary | ICD-10-CM

## 2011-09-25 DIAGNOSIS — K219 Gastro-esophageal reflux disease without esophagitis: Secondary | ICD-10-CM

## 2011-09-25 MED ORDER — AMOXICILLIN-POT CLAVULANATE 875-125 MG PO TABS: 1.0000 | ORAL_TABLET | Freq: Two times a day (BID) | ORAL | Status: AC

## 2011-09-25 MED ORDER — OMEPRAZOLE 40 MG PO CPDR: 40.0000 mg | DELAYED_RELEASE_CAPSULE | Freq: Every day | ORAL | Status: DC

## 2011-09-25 NOTE — Patient Instructions (Signed)
Goal weight 135-140.

## 2011-09-25 NOTE — Progress Notes (Signed)
Subjective:    Patient ID: Robin Alexander, female    DOB: 22-Nov-1932, 78 y.o.   MRN: [id number]  HPI 78 year old female with history of GERD presents for followup. At her last visit, we started omeprazole. She reports complete resolution of her symptoms with use of omeprazole. She has been taking this on a daily basis. She denies any abdominal pain, nausea, change in bowel habits.  She is concerned today about several week history of nasal congestion and cough productive of purulent sputum. She initially developed symptoms of fever, chills, malaise, rhinorrhea which she covers consistent with viral upper respiratory infection. Over the last couple of weeks her malaise and fever completely resolved, however her cough has persisted. She denies any shortness of breath. She denies any wheezing. She has been using over-the-counter cough and cold preparations with some improvement in her symptoms.  Outpatient Encounter Prescriptions as of 09/25/2011  Medication Sig Dispense Refill  . acetaminophen (TYLENOL) 500 MG tablet Take 1,000 mg by mouth 2 (two) times daily.        Marland Kitchen aspirin EC 81 MG tablet Take 81 mg by mouth daily.        . benazepril (LOTENSIN) 20 MG tablet Take 1 tablet (20 mg total) by mouth daily.  90 tablet  1  . Calcium-Vitamin D 600-200 MG-UNIT per tablet Take 2 tablets by mouth daily.        . cholecalciferol (VITAMIN D) 1000 UNITS tablet Take 1,000 Units by mouth daily.        . diphenhydrAMINE (BENADRYL) 25 MG tablet Take 25 mg by mouth daily.        . fish oil-omega-3 fatty acids 1000 MG capsule Take 1,200 mg by mouth 2 (two) times daily.        . Multiple Vitamin (MULTIVITAMIN) capsule Take 1 capsule by mouth daily.        Marland Kitchen omeprazole (PRILOSEC) 40 MG capsule Take 1 capsule (40 mg total) by mouth daily.  90 capsule  3  . simvastatin (ZOCOR) 20 MG tablet Take 1 tablet (20 mg total) by mouth at bedtime.  90 tablet  1  . vitamin C (ASCORBIC ACID) 500 MG tablet Take 500 mg by mouth  daily.        Marland Kitchen DISCONTD: omeprazole (PRILOSEC) 40 MG capsule Take 1 capsule (40 mg total) by mouth daily.  30 capsule  6  . amoxicillin-clavulanate (AUGMENTIN) 875-125 MG per tablet Take 1 tablet by mouth 2 (two) times daily.  20 tablet  0  . DISCONTD: ranitidine (ZANTAC) 150 MG capsule Take 150 mg by mouth 2 (two) times daily.          Review of Systems  Constitutional: Negative for fever, chills, appetite change, fatigue and unexpected weight change.  HENT: Positive for congestion and postnasal drip. Negative for ear pain, sore throat, trouble swallowing, neck pain, voice change and sinus pressure.   Eyes: Negative for visual disturbance.  Respiratory: Positive for cough. Negative for shortness of breath, wheezing and stridor.   Cardiovascular: Negative for chest pain, palpitations and leg swelling.  Gastrointestinal: Negative for nausea, vomiting, abdominal pain, diarrhea, constipation, blood in stool, abdominal distention and anal bleeding.  Genitourinary: Negative for dysuria and flank pain.  Musculoskeletal: Negative for myalgias, arthralgias and gait problem.  Skin: Negative for color change and rash.  Neurological: Negative for dizziness and headaches.  Hematological: Negative for adenopathy. Does not bruise/bleed easily.  Psychiatric/Behavioral: Negative for suicidal ideas, sleep disturbance and dysphoric mood. The  patient is not nervous/anxious.    BP 132/60  Pulse 72  Temp(Src) 97.7 F (36.5 C) (Oral)  Ht 5' (1.524 m)  Wt 152 lb (68.947 kg)  BMI 29.69 kg/m2  SpO2 93%     Objective:   Physical Exam  Constitutional: She is oriented to person, place, and time. She appears well-developed and well-nourished. No distress.  HENT:  Head: Normocephalic and atraumatic.  Right Ear: External ear normal.  Left Ear: External ear normal.  Nose: Nose normal.  Mouth/Throat: Oropharynx is clear and moist. No oropharyngeal exudate.  Eyes: Conjunctivae are normal. Pupils are equal,  round, and reactive to light. Right eye exhibits no discharge. Left eye exhibits no discharge. No scleral icterus.  Neck: Normal range of motion. Neck supple. No tracheal deviation present. No thyromegaly present.  Cardiovascular: Normal rate, regular rhythm, normal heart sounds and intact distal pulses.  Exam reveals no gallop and no friction rub.   No murmur heard. Pulmonary/Chest: Effort normal. No accessory muscle usage. Not tachypneic. No respiratory distress. She has decreased breath sounds in the left lower field. She has no wheezes. She has rhonchi in the left lower field. She has no rales. She exhibits no tenderness.  Abdominal: Soft. Bowel sounds are normal. She exhibits no distension and no mass. There is no tenderness. There is no rebound and no guarding.  Musculoskeletal: Normal range of motion. She exhibits no edema and no tenderness.  Lymphadenopathy:    She has no cervical adenopathy.  Neurological: She is alert and oriented to person, place, and time. No cranial nerve deficit. She exhibits normal muscle tone. Coordination normal.  Skin: Skin is warm and dry. No rash noted. She is not diaphoretic. No erythema. No pallor.  Psychiatric: She has a normal mood and affect. Her behavior is normal. Judgment and thought content normal.          Assessment & Plan:

## 2011-09-25 NOTE — Assessment & Plan Note (Signed)
Symptoms resolved with omeprazole. Will plan to continue. Patient will decrease frequency of omeprazole use to every other day. She will monitor closely for recurrent symptoms. She will call if any worsening abdominal pain or reflux symptoms. Followup in March 2013.

## 2011-09-25 NOTE — Assessment & Plan Note (Signed)
Symptoms and exam are most consistent with bronchitis. Will start Augmentin. Patient will call if any worsening shortness of breath, fever, or cough.

## 2011-09-30 ENCOUNTER — Ambulatory Visit
Admission: RE | Admit: 2011-09-30 | Discharge: 2011-09-30 | Disposition: A | Discharge status: Home / Self Care | Payer: Medicare Other | Source: Ambulatory Visit | Attending: Internal Medicine | Admitting: Internal Medicine

## 2011-09-30 DIAGNOSIS — Z1231 Encounter for screening mammogram for malignant neoplasm of breast: Secondary | ICD-10-CM

## 2011-09-30 DIAGNOSIS — Z780 Asymptomatic menopausal state: Secondary | ICD-10-CM

## 2011-09-30 LAB — HM MAMMOGRAPHY: HM Mammogram: NORMAL

## 2011-11-07 ENCOUNTER — Encounter: Payer: Self-pay | Admitting: Internal Medicine

## 2011-12-26 ENCOUNTER — Encounter: Payer: Self-pay | Admitting: Internal Medicine

## 2011-12-26 ENCOUNTER — Ambulatory Visit (INDEPENDENT_AMBULATORY_CARE_PROVIDER_SITE_OTHER): Payer: Medicare Other | Admitting: Internal Medicine

## 2011-12-26 VITALS — BP 150/75 | HR 68 | Temp 98.2°F | Ht <= 58 in | Wt 145.0 lb

## 2011-12-26 DIAGNOSIS — I10 Essential (primary) hypertension: Secondary | ICD-10-CM

## 2011-12-26 LAB — COMPREHENSIVE METABOLIC PANEL
ALT: 23 U/L (ref 0–35)
AST: 27 U/L (ref 0–37)
Albumin: 4.3 g/dL (ref 3.5–5.2)
Alkaline Phosphatase: 56 U/L (ref 39–117)
Potassium: 4.2 mEq/L (ref 3.5–5.1)
Sodium: 137 mEq/L (ref 135–145)
Total Protein: 7.1 g/dL (ref 6.0–8.3)

## 2011-12-26 LAB — LIPID PANEL
LDL Cholesterol: 78 mg/dL (ref 0–99)
Total CHOL/HDL Ratio: 3
VLDL: 19.6 mg/dL (ref 0.0–40.0)

## 2011-12-26 NOTE — Progress Notes (Signed)
Patient ID: AVIS TIRONE, female   DOB: 03/15/1933, 78 y.o.   MRN: [id number]  Subjective:    Robin Alexander is a 78 y.o. female who presents for Medicare Annual/Subsequent preventive examination. She is doing well and has no concerns today.  Preventive Screening-Counseling & Management  Tobacco History  Smoking status  . Former Smoker  . Types: Cigarettes  . Quit date: 09/01/1993  Smokeless tobacco  . Never Used  Comment: Quit 30 years ago      Current Problems (verified) Patient Active Problem List  Diagnoses  . GERD (gastroesophageal reflux disease)  . Hypertension  . Hyperlipidemia  . Bronchitis    Medications Prior to Visit Current Outpatient Prescriptions on File Prior to Visit  Medication Sig Dispense Refill  . acetaminophen (TYLENOL) 500 MG tablet Take 1,000 mg by mouth 2 (two) times daily.        Marland Kitchen aspirin EC 81 MG tablet Take 81 mg by mouth daily.        . benazepril (LOTENSIN) 20 MG tablet Take 1 tablet (20 mg total) by mouth daily.  90 tablet  1  . Calcium-Vitamin D 600-200 MG-UNIT per tablet Take 2 tablets by mouth daily.        . cholecalciferol (VITAMIN D) 1000 UNITS tablet Take 1,000 Units by mouth daily.        . diphenhydrAMINE (BENADRYL) 25 MG tablet Take 25 mg by mouth daily.        . fish oil-omega-3 fatty acids 1000 MG capsule Take 1,000 mg by mouth 2 (two) times daily.       . Multiple Vitamin (MULTIVITAMIN) capsule Take 1 capsule by mouth daily.        Marland Kitchen omeprazole (PRILOSEC) 40 MG capsule Take 1 capsule (40 mg total) by mouth daily.  90 capsule  3  . simvastatin (ZOCOR) 20 MG tablet Take 1 tablet (20 mg total) by mouth at bedtime.  90 tablet  1  . vitamin C (ASCORBIC ACID) 500 MG tablet Take 500 mg by mouth daily.          Current Medications (verified) Current Outpatient Prescriptions  Medication Sig Dispense Refill  . acetaminophen (TYLENOL) 500 MG tablet Take 1,000 mg by mouth 2 (two) times daily.        Marland Kitchen aspirin EC 81 MG tablet Take  81 mg by mouth daily.        . benazepril (LOTENSIN) 20 MG tablet Take 1 tablet (20 mg total) by mouth daily.  90 tablet  1  . Calcium-Vitamin D 600-200 MG-UNIT per tablet Take 2 tablets by mouth daily.        . cholecalciferol (VITAMIN D) 1000 UNITS tablet Take 1,000 Units by mouth daily.        . diphenhydrAMINE (BENADRYL) 25 MG tablet Take 25 mg by mouth daily.        . fish oil-omega-3 fatty acids 1000 MG capsule Take 1,000 mg by mouth 2 (two) times daily.       . Multiple Vitamin (MULTIVITAMIN) capsule Take 1 capsule by mouth daily.        Marland Kitchen omeprazole (PRILOSEC) 40 MG capsule Take 1 capsule (40 mg total) by mouth daily.  90 capsule  3  . simvastatin (ZOCOR) 20 MG tablet Take 1 tablet (20 mg total) by mouth at bedtime.  90 tablet  1  . vitamin C (ASCORBIC ACID) 500 MG tablet Take 500 mg by mouth daily.  Allergies (verified) Codeine   PAST HISTORY  Family History Family History  Problem Relation Age of Onset  . Arthritis Mother   . Heart disease Mother   . Arthritis Father   . Stroke Brother   . Heart disease Brother   . Cancer Neg Hx     Social History History  Substance Use Topics  . Smoking status: Former Smoker    Types: Cigarettes    Quit date: 09/01/1993  . Smokeless tobacco: Never Used   Comment: Quit 30 years ago  . Alcohol Use: Yes     Occasional wine     Are there smokers in your home (other than you)? No  Risk Factors Current exercise habits: Home exercise routine includes walking.  Dietary issues discussed: Healthy diet, increased fruits and veggies   Cardiac risk factors: advanced age (older than 54 for men, 48 for women).  Depression Screen (Note: if answer to either of the following is "Yes", a more complete depression screening is indicated)   Over the past two weeks, have you felt down, depressed or hopeless? No  Over the past two weeks, have you felt little interest or pleasure in doing things? No  Have you lost interest or pleasure  in daily life? No  Do you often feel hopeless? No  Do you cry easily over simple problems? No  Activities of Daily Living In your present state of health, do you have any difficulty performing the following activities?:  Driving? No Managing money?  No Feeding yourself? No Getting from bed to chair? No Climbing a flight of stairs? No Preparing food and eating?: No Bathing or showering? No Getting dressed: No Getting to the toilet? No Using the toilet:No Moving around from place to place: No In the past year have you fallen or had a near fall?:No   Are you sexually active?  No  Do you have more than one partner?  No  Hearing Difficulties: No Do you often ask people to speak up or repeat themselves? No Do you experience ringing or noises in your ears? No Do you have difficulty understanding soft or whispered voices? No   Do you feel that you have a problem with memory? No  Do you often misplace items? No  Do you feel safe at home?  Yes  Cognitive Testing  Alert? Yes  Normal Appearance?Yes  Oriented to person? Yes  Place? Yes   Time? Yes  Recall of three objects?  Yes  Can perform simple calculations? Yes  Displays appropriate judgment?Yes  Can read the correct time from a watch face?Yes   Advanced Directives have been discussed with the patient? Yes, HCPOA is Monica Martinez, son  List the Names of Other Physician/Practitioners you currently use: 1.  Dr. Gerlene Fee - Neurosurgeon in [place] any recent Medical Services you may have received from other than [facility] providers in the past year (date may be approximate).  Immunization History  Administered Date(s) Administered  . Influenza Split 07/31/2011  . Pneumococcal Polysaccharide 09/01/2009  . Zoster 07/30/2010    Screening Tests Health Maintenance  Topic Date Due  . Tetanus/tdap  11/28/1953  . Influenza Vaccine  06/01/2012  . Colonoscopy  09/01/2018  . Pneumococcal Polysaccharide Vaccine Age 15 And Over   Completed  . Zostavax  Completed    All answers were reviewed with the patient and necessary referrals were made:  Shelia Media, MD   12/26/2011   History reviewed: allergies, current medications, past family history, past medical  history, past social history, past surgical history and problem list  Review of Systems A comprehensive review of systems was negative.    Objective:      Body mass index is 30.31 kg/(m^2). BP 150/75  Pulse 68  Temp(Src) 98.2 F (36.8 C) (Oral)  Ht 4\' 10"  (1.473 m)  Wt 145 lb (65.772 kg)  BMI 30.31 kg/m2  SpO2 94%  BP 150/75  Pulse 68  Temp(Src) 98.2 F (36.8 C) (Oral)  Ht 4\' 10"  (1.473 m)  Wt 145 lb (65.772 kg)  BMI 30.31 kg/m2  SpO2 94%  General Appearance:    Alert, cooperative, no distress, appears stated age  Head:    Normocephalic, without obvious abnormality, atraumatic  Eyes:    PERRL, conjunctiva/corneas clear, EOM's intact, fundi    benign, both eyes  Ears:    Normal TM's and external ear canals, both ears  Nose:   Nares normal, septum midline, mucosa normal, no drainage    or sinus tenderness  Throat:   Lips, mucosa, and tongue normal; teeth and gums normal  Neck:   Supple, symmetrical, trachea midline, no adenopathy;    thyroid:  no enlargement/tenderness/nodules; no carotid   bruit or JVD  Back:     Symmetric, no curvature, ROM normal, no CVA tenderness  Lungs:     Clear to auscultation bilaterally, respirations unlabored  Chest Wall:    No tenderness or deformity   Heart:    Regular rate and rhythm, S1 and S2 normal, 2/6 systolic murmur, rub   or gallop  Breast Exam:    No tenderness, masses, or nipple abnormality  Abdomen:     Soft, non-tender, bowel sounds active all four quadrants,    no masses, no organomegaly     Rectal:    Normal tone, normal prostate, no masses or tenderness;   guaiac negative stool  Extremities:   Extremities normal, atraumatic, no cyanosis or edema  Pulses:   2+ and symmetric all  extremities  Skin:   Skin color, texture, turgor normal, no rashes or lesions  Lymph nodes:   Cervical, supraclavicular, and axillary nodes normal  Neurologic:   CNII-XII intact, normal strength, sensation and reflexes    throughout       Assessment:     78 year old female with hyperlipidemia and hypertension present for wellness exam. She is up-to-date on health maintenance screening. She is doing extremely well and is independent in her activities of daily living.     Plan:     During the course of the visit the patient was educated and counseled about appropriate screening and preventive services.   Patient Instructions (the written plan) was given to the patient.  Medicare Attestation I have personally reviewed: The patient's medical and social history Their use of alcohol, tobacco or illicit drugs Their current medications and supplements The patient's functional ability including ADLs,fall risks, home safety risks, cognitive, and hearing and visual impairment Diet and physical activities Evidence for depression or mood disorders  The patient's weight, height, BMI, and visual acuity have been recorded in the chart.  I have made referrals, counseling, and provided education to the patient based on review of the above and I have provided the patient with a written personalized care plan for preventive services.     Shelia Media, MD   12/26/2011

## 2011-12-29 ENCOUNTER — Telehealth: Payer: Self-pay | Admitting: *Deleted

## 2011-12-29 NOTE — Telephone Encounter (Signed)
Patient informed/SLS  

## 2011-12-29 NOTE — Telephone Encounter (Signed)
Message copied by Regis Bill on Mon Dec 29, 2011  9:04 AM ------      Message from: Ronna Polio A      Created: Fri Dec 26, 2011  5:14 PM       Labs are excellent.

## 2012-01-14 ENCOUNTER — Other Ambulatory Visit: Payer: Self-pay | Admitting: *Deleted

## 2012-01-14 DIAGNOSIS — I10 Essential (primary) hypertension: Secondary | ICD-10-CM

## 2012-01-14 DIAGNOSIS — E785 Hyperlipidemia, unspecified: Secondary | ICD-10-CM

## 2012-01-14 MED ORDER — SIMVASTATIN 20 MG PO TABS: 20.0000 mg | ORAL_TABLET | Freq: Every day | ORAL | Status: DC

## 2012-01-14 MED ORDER — BENAZEPRIL HCL 20 MG PO TABS: 20.0000 mg | ORAL_TABLET | Freq: Every day | ORAL | Status: DC

## 2012-01-14 NOTE — Telephone Encounter (Signed)
Request for simvastatin & benazepril to [facility]/SLS Done.

## 2012-06-29 ENCOUNTER — Ambulatory Visit (INDEPENDENT_AMBULATORY_CARE_PROVIDER_SITE_OTHER): Payer: Medicare Other | Admitting: Internal Medicine

## 2012-06-29 ENCOUNTER — Encounter: Payer: Self-pay | Admitting: *Deleted

## 2012-06-29 ENCOUNTER — Encounter: Payer: Self-pay | Admitting: Internal Medicine

## 2012-06-29 VITALS — BP 176/76 | HR 74 | Temp 98.4°F | Ht 63.0 in | Wt 150.5 lb

## 2012-06-29 DIAGNOSIS — Z23 Encounter for immunization: Secondary | ICD-10-CM

## 2012-06-29 DIAGNOSIS — I10 Essential (primary) hypertension: Secondary | ICD-10-CM

## 2012-06-29 DIAGNOSIS — E785 Hyperlipidemia, unspecified: Secondary | ICD-10-CM

## 2012-06-29 LAB — COMPREHENSIVE METABOLIC PANEL
ALT: 17 U/L (ref 0–35)
AST: 25 U/L (ref 0–37)
Albumin: 4.2 g/dL (ref 3.5–5.2)
Alkaline Phosphatase: 61 U/L (ref 39–117)
Calcium: 9.7 mg/dL (ref 8.4–10.5)
Chloride: 100 mEq/L (ref 96–112)
Creatinine, Ser: 1.1 mg/dL (ref 0.4–1.2)
Potassium: 5.1 mEq/L (ref 3.5–5.1)

## 2012-06-29 LAB — LIPID PANEL
HDL: 36.8 mg/dL — ABNORMAL LOW (ref >39.00)
LDL Cholesterol: 102 mg/dL — ABNORMAL HIGH (ref 0–99)
Total CHOL/HDL Ratio: 4

## 2012-06-29 LAB — MICROALBUMIN / CREATININE URINE RATIO: Microalb, Ur: 2.6 mg/dL — ABNORMAL HIGH (ref 0.0–1.9)

## 2012-06-29 MED ORDER — BENAZEPRIL HCL 20 MG PO TABS: 20.0000 mg | ORAL_TABLET | Freq: Every day | ORAL | Status: DC

## 2012-06-29 NOTE — Progress Notes (Signed)
Subjective:    Patient ID: Robin Alexander, female    DOB: [birth year], 78 y.o.   MRN: [id number]  HPI 78YO female with h/o hypertension and hyperlipidemia presents for follow up. Doing well. No new concerns today. BP well controlled when checked at home. No headache, chest pain, fatigue, dyspnea.  Walking on a regular basis with her husband.  Outpatient Encounter Prescriptions as of 06/29/2012  Medication Sig Dispense Refill  . acetaminophen (TYLENOL) 500 MG tablet Take 1,000 mg by mouth 2 (two) times daily.        Marland Kitchen aspirin EC 81 MG tablet Take 81 mg by mouth daily.        . benazepril (LOTENSIN) 20 MG tablet Take 1 tablet (20 mg total) by mouth daily.  90 tablet  4  . Calcium-Vitamin D 600-200 MG-UNIT per tablet Take 2 tablets by mouth daily.        . cholecalciferol (VITAMIN D) 1000 UNITS tablet Take 1,000 Units by mouth daily.        . diphenhydrAMINE (BENADRYL) 25 MG tablet Take 25 mg by mouth daily.        . fish oil-omega-3 fatty acids 1000 MG capsule Take 1,000 mg by mouth 2 (two) times daily.       . Multiple Vitamin (MULTIVITAMIN) capsule Take 1 capsule by mouth daily.        Marland Kitchen omeprazole (PRILOSEC) 40 MG capsule Take 1 capsule (40 mg total) by mouth daily.  90 capsule  3  . simvastatin (ZOCOR) 20 MG tablet Take 1 tablet (20 mg total) by mouth at bedtime.  90 tablet  1  . vitamin C (ASCORBIC ACID) 500 MG tablet Take 500 mg by mouth daily.        Marland Kitchen DISCONTD: benazepril (LOTENSIN) 20 MG tablet Take 1 tablet (20 mg total) by mouth daily.  90 tablet  1   BP 176/76  Pulse 74  Temp 98.4 F (36.9 C) (Oral)  Ht 5\' 3"  (1.6 m)  Wt 150 lb 8 oz (68.266 kg)  BMI 26.66 kg/m2  SpO2 98%  Review of Systems  Constitutional: Negative for fever, chills, appetite change, fatigue and unexpected weight change.  HENT: Negative for ear pain, congestion, sore throat, trouble swallowing, neck pain, voice change and sinus pressure.   Eyes: Negative for visual disturbance.  Respiratory: Negative for  cough, shortness of breath, wheezing and stridor.   Cardiovascular: Negative for chest pain, palpitations and leg swelling.  Gastrointestinal: Negative for nausea, vomiting, abdominal pain, diarrhea, constipation, blood in stool, abdominal distention and anal bleeding.  Genitourinary: Negative for dysuria and flank pain.  Musculoskeletal: Negative for myalgias, arthralgias and gait problem.  Skin: Negative for color change and rash.  Neurological: Negative for dizziness and headaches.  Hematological: Negative for adenopathy. Does not bruise/bleed easily.  Psychiatric/Behavioral: Negative for suicidal ideas, disturbed wake/sleep cycle and dysphoric mood. The patient is not nervous/anxious.        Objective:   Physical Exam  Constitutional: She is oriented to person, place, and time. She appears well-developed and well-nourished. No distress.  HENT:  Head: Normocephalic and atraumatic.  Right Ear: External ear normal.  Left Ear: External ear normal.  Nose: Nose normal.  Mouth/Throat: Oropharynx is clear and moist. No oropharyngeal exudate.  Eyes: Conjunctivae normal are normal. Pupils are equal, round, and reactive to light. Right eye exhibits no discharge. Left eye exhibits no discharge. No scleral icterus.  Neck: Normal range of motion. Neck supple. No tracheal deviation present. No  thyromegaly present.  Cardiovascular: Normal rate, regular rhythm, normal heart sounds and intact distal pulses.  Exam reveals no gallop and no friction rub.   No murmur heard. Pulmonary/Chest: Effort normal and breath sounds normal. No respiratory distress. She has no wheezes. She has no rales. She exhibits no tenderness.  Musculoskeletal: Normal range of motion. She exhibits no edema and no tenderness.  Lymphadenopathy:    She has no cervical adenopathy.  Neurological: She is alert and oriented to person, place, and time. No cranial nerve deficit. She exhibits normal muscle tone. Coordination normal.    Skin: Skin is warm and dry. No rash noted. She is not diaphoretic. No erythema. No pallor.  Psychiatric: She has a normal mood and affect. Her behavior is normal. Judgment and thought content normal.          Assessment & Plan:

## 2012-06-29 NOTE — Assessment & Plan Note (Signed)
BP elevated today, but pt reports good control at home. Will have her monitor and recheck at home 2-3 times per week. She will email with readings. Consider adding Amlodipine if BP consistently >140/90.  Renal function today showed dehydration. Will encourage increase in fluid intake. Follow up 6 months and prn.

## 2012-06-29 NOTE — Assessment & Plan Note (Signed)
Lipids fairly well controlled today. LFTs normal. Will continue simvastatin. Encouraged continued efforts at healthy diet and regular physical activity. Follow up 6 months and prn.

## 2012-09-10 ENCOUNTER — Other Ambulatory Visit: Payer: Self-pay | Admitting: *Deleted

## 2012-09-10 DIAGNOSIS — E785 Hyperlipidemia, unspecified: Secondary | ICD-10-CM

## 2012-09-10 MED ORDER — SIMVASTATIN 20 MG PO TABS: 20.0000 mg | ORAL_TABLET | Freq: Every day | ORAL | Status: DC

## 2012-12-29 ENCOUNTER — Encounter: Payer: Medicare Other | Admitting: Internal Medicine

## 2013-05-04 ENCOUNTER — Encounter: Payer: Self-pay | Admitting: Internal Medicine

## 2013-05-04 ENCOUNTER — Ambulatory Visit (INDEPENDENT_AMBULATORY_CARE_PROVIDER_SITE_OTHER): Payer: Medicare Other | Admitting: Internal Medicine

## 2013-05-04 ENCOUNTER — Telehealth: Payer: Self-pay | Admitting: *Deleted

## 2013-05-04 VITALS — BP 160/80 | HR 73 | Temp 98.2°F | Ht 58.5 in | Wt 151.0 lb

## 2013-05-04 DIAGNOSIS — Z1239 Encounter for other screening for malignant neoplasm of breast: Secondary | ICD-10-CM

## 2013-05-04 DIAGNOSIS — K219 Gastro-esophageal reflux disease without esophagitis: Secondary | ICD-10-CM

## 2013-05-04 DIAGNOSIS — I10 Essential (primary) hypertension: Secondary | ICD-10-CM

## 2013-05-04 DIAGNOSIS — M2550 Pain in unspecified joint: Secondary | ICD-10-CM | POA: Insufficient documentation

## 2013-05-04 DIAGNOSIS — Z0000 Encounter for general adult medical examination without abnormal findings: Secondary | ICD-10-CM

## 2013-05-04 DIAGNOSIS — Z23 Encounter for immunization: Secondary | ICD-10-CM

## 2013-05-04 DIAGNOSIS — I4510 Unspecified right bundle-branch block: Secondary | ICD-10-CM | POA: Insufficient documentation

## 2013-05-04 LAB — CBC WITH DIFFERENTIAL/PLATELET
Basophils Relative: 0.6 % (ref 0.0–3.0)
Eosinophils Relative: 3.6 % (ref 0.0–5.0)
HCT: 20.5 % — CL (ref 36.0–46.0)
Lymphs Abs: 2.9 10*3/uL (ref 0.7–4.0)
MCV: 94.5 fl (ref 78.0–100.0)
Monocytes Absolute: 0.7 10*3/uL (ref 0.1–1.0)
Neutro Abs: 4.8 10*3/uL (ref 1.4–7.7)
RBC: 2.17 Mil/uL — ABNORMAL LOW (ref 3.87–5.11)
WBC: 8.7 10*3/uL (ref 4.5–10.5)

## 2013-05-04 LAB — LIPID PANEL
Cholesterol: 190 mg/dL (ref 0–200)
LDL Cholesterol: 118 mg/dL — ABNORMAL HIGH (ref 0–99)
Triglycerides: 139 mg/dL (ref 0.0–149.0)

## 2013-05-04 LAB — COMPREHENSIVE METABOLIC PANEL
ALT: 21 U/L (ref 0–35)
AST: 19 U/L (ref 0–37)
Alkaline Phosphatase: 69 U/L (ref 39–117)
BUN: 26 mg/dL — ABNORMAL HIGH (ref 6–23)
CO2: 29 mEq/L (ref 19–32)
Glucose, Bld: 93 mg/dL (ref 70–99)
Potassium: 4.8 mEq/L (ref 3.5–5.1)
Sodium: 134 mEq/L — ABNORMAL LOW (ref 135–145)

## 2013-05-04 MED ORDER — OMEPRAZOLE 40 MG PO CPDR: 40.0000 mg | DELAYED_RELEASE_CAPSULE | Freq: Every day | ORAL | Status: DC

## 2013-05-04 NOTE — Assessment & Plan Note (Signed)
General medical exam including breast exam is normal today except as noted. Pap and pelvic deferred as patient is status post hysterectomy and has history of all normal Paps in the past. Health maintenance is up to date except for mammogram which has been ordered. Immunizations are up to date. Appropriate screening performed. Encouraged continued efforts at healthy diet and regular physical activity with walking.

## 2013-05-04 NOTE — Assessment & Plan Note (Signed)
BP Readings from Last 3 Encounters:  05/04/13 160/80  06/29/12 176/76  12/26/11 150/75   BP slightly elevated today, however pt reports better control at home. Will monitor. If persistently >140/90, will increase Benazepril. Will check renal function with labs today.

## 2013-05-04 NOTE — Telephone Encounter (Signed)
Please have pt get FOBT asap and repeat CBC with ferritin and TIBC. Follow up this week.

## 2013-05-04 NOTE — Telephone Encounter (Signed)
Patient informed to come pick this up, at front for pick up and appointment scheduled. Aware to have labs redrawn when she bring back iFOB

## 2013-05-04 NOTE — Assessment & Plan Note (Signed)
Symptoms of arthralgia which improves throughout the day and exam findings of ulnar deviation of fingers of both hands most consistent with RA. Will send ANA, ESR, CRP, RF with labs today. Encouraged continued use of Tylenol and Ibuprofen prn.

## 2013-05-04 NOTE — Telephone Encounter (Signed)
Kennyth Lose from the [facility] lab called with critical lab results on patient.  Hemoglobin 7 Hct 20.5

## 2013-05-04 NOTE — Progress Notes (Signed)
Subjective:    Patient ID: Robin Alexander, female    DOB: Jun 29, 1933, 79 y.o.   MRN: [id number]  HPI The patient is here for annual Medicare wellness examination and management of other chronic and acute problems.   The risk factors are reflected in the social history.  The roster of all physicians providing medical care to patient - is listed in the Snapshot section of the chart.  Activities of daily living:  The patient is 100% independent in all ADLs: dressing, toileting, feeding as well as independent mobility  Home safety : The patient has smoke detectors in the home. They wear seatbelts.  There are no firearms at home. There is no violence in the home.   There is no risks for hepatitis, STDs or HIV. There is a history of blood transfusion, after birth of her son. They have no travel history to infectious disease endemic areas of the world.  The patient has seen their dentist in the last six month. Dr. Jerelyn Scott They have seen their eye doctor in the last year.  Dr. Clydene Pugh  They have deferred audiologic testing in the last year.   They do not  have excessive sun exposure. Discussed the need for sun protection: hats, long sleeves and use of sunscreen if there is significant sun exposure. Dermatologist - Dr. Roseanne Kaufman  Diet: the importance of a healthy diet is discussed. They do have a healthy diet.  The benefits of regular aerobic exercise were discussed. She walks on a regular basis.  Depression screen: there are no signs or vegative symptoms of depression- irritability, change in appetite, anhedonia, sadness/tearfullness.  Cognitive assessment: the patient manages all their financial and personal affairs and is actively engaged. They could relate day,date,year and events.  HCPOA - Monica Martinez  The following portions of the patient's history were reviewed and updated as appropriate: allergies, current medications, past family history, past medical history,  past surgical  history, past social history  and problem list.  Visual acuity was not assessed per patient preference since she has regular follow up with her ophthalmologist. Hearing and body mass index were assessed and reviewed.   During the course of the visit the patient was educated and counseled about appropriate screening and preventive services including : fall prevention , diabetes screening, nutrition counseling, colorectal cancer screening, and recommended immunizations.    Patient is also concerned about progressive worsening of joint pain. She reports pain in "all the joints of her body "which is much worse in the morning and improves throughout the day. She also notes some stiffness in the joints of her hands. She has been taking Tylenol 1000 mg twice daily with some improvement. She also occasionally takes ibuprofen when pain is severe, typically 400 mg with some improvement. Both of her parents had arthritis of unknown type.   Outpatient Encounter Prescriptions as of 05/04/2013  Medication Sig Dispense Refill  . acetaminophen (TYLENOL) 500 MG tablet Take 1,000 mg by mouth 2 (two) times daily.       Marland Kitchen aspirin EC 81 MG tablet Take 81 mg by mouth daily.        . benazepril (LOTENSIN) 20 MG tablet Take 1 tablet (20 mg total) by mouth daily.  90 tablet  4  . Calcium-Vitamin D 600-200 MG-UNIT per tablet Take 2 tablets by mouth daily.        . cholecalciferol (VITAMIN D) 1000 UNITS tablet Take 1,000 Units by mouth daily.        Marland Kitchen  diphenhydrAMINE (BENADRYL) 25 MG tablet Take 25 mg by mouth daily.        . fish oil-omega-3 fatty acids 1000 MG capsule Take 1,000 mg by mouth 2 (two) times daily.       . Multiple Vitamin (MULTIVITAMIN) capsule Take 1 capsule by mouth daily.        Bernadette Hoit Sodium ([employer] STOOL SOFTENER/LAXATIVE PO) Take by mouth.      . simvastatin (ZOCOR) 20 MG tablet Take 1 tablet (20 mg total) by mouth at bedtime.  90 tablet  3  . vitamin C (ASCORBIC ACID) 500 MG tablet Take  500 mg by mouth daily.        Marland Kitchen omeprazole (PRILOSEC) 40 MG capsule Take 1 capsule (40 mg total) by mouth daily.  90 capsule  3   No facility-administered encounter medications on file as of 05/04/2013.   BP 160/80  Pulse 73  Temp(Src) 98.2 F (36.8 C) (Oral)  Ht 4' 10.5" (1.486 m)  Wt 151 lb (68.493 kg)  BMI 31.02 kg/m2  SpO2 95%   Review of Systems  Constitutional: Negative for fever, chills, appetite change, fatigue and unexpected weight change.  HENT: Negative for ear pain, congestion, sore throat, trouble swallowing, neck pain, voice change and sinus pressure.   Eyes: Negative for visual disturbance.  Respiratory: Negative for cough, shortness of breath, wheezing and stridor.   Cardiovascular: Negative for chest pain, palpitations and leg swelling.  Gastrointestinal: Negative for nausea, vomiting, abdominal pain, diarrhea, constipation, blood in stool, abdominal distention and anal bleeding.  Genitourinary: Negative for dysuria and flank pain.  Musculoskeletal: Positive for joint swelling and arthralgias. Negative for myalgias and gait problem.  Skin: Negative for color change and rash.  Neurological: Negative for dizziness and headaches.  Hematological: Negative for adenopathy. Does not bruise/bleed easily.  Psychiatric/Behavioral: Negative for suicidal ideas, sleep disturbance and dysphoric mood. The patient is not nervous/anxious.        Objective:   Physical Exam  Constitutional: She is oriented to person, place, and time. She appears well-developed and well-nourished. No distress.  HENT:  Head: Normocephalic and atraumatic.  Right Ear: External ear normal.  Left Ear: External ear normal.  Nose: Nose normal.  Mouth/Throat: Oropharynx is clear and moist. No oropharyngeal exudate.  Eyes: Conjunctivae are normal. Pupils are equal, round, and reactive to light. Right eye exhibits no discharge. Left eye exhibits no discharge. No scleral icterus.  Neck: Normal range of  motion. Neck supple. No tracheal deviation present. No thyromegaly present.  Cardiovascular: Normal rate, regular rhythm and intact distal pulses.  Exam reveals no gallop and no friction rub.   Murmur (systolic) heard. Pulmonary/Chest: Effort normal and breath sounds normal. No accessory muscle usage. Not tachypneic. No respiratory distress. She has no decreased breath sounds. She has no wheezes. She has no rhonchi. She has no rales. She exhibits no tenderness. Right breast exhibits no inverted nipple, no mass, no nipple discharge, no skin change and no tenderness. Left breast exhibits no inverted nipple, no mass, no nipple discharge, no skin change and no tenderness. Breasts are symmetrical.  Abdominal: Soft. Bowel sounds are normal. She exhibits no distension and no mass. There is no tenderness. There is no rebound and no guarding.  Musculoskeletal: She exhibits no edema and no tenderness.       Right hand: She exhibits decreased range of motion. She exhibits no tenderness and no bony tenderness.       Left hand: She exhibits decreased range of motion.  She exhibits no tenderness and no bony tenderness.  Ulnar deviation fingers bilateral hands  Lymphadenopathy:    She has no cervical adenopathy.  Neurological: She is alert and oriented to person, place, and time. No cranial nerve deficit. She exhibits normal muscle tone. Coordination normal.  Skin: Skin is warm and dry. No rash noted. She is not diaphoretic. No erythema. No pallor.  Psychiatric: She has a normal mood and affect. Her behavior is normal. Judgment and thought content normal.          Assessment & Plan:

## 2013-05-04 NOTE — Assessment & Plan Note (Signed)
RBBB noted on EKG. No previous EKG for comparison. Will try to obtain EKG from prior record for comparison.

## 2013-05-05 ENCOUNTER — Other Ambulatory Visit (INDEPENDENT_AMBULATORY_CARE_PROVIDER_SITE_OTHER): Payer: Medicare Other

## 2013-05-05 ENCOUNTER — Telehealth: Payer: Self-pay | Admitting: *Deleted

## 2013-05-05 DIAGNOSIS — D649 Anemia, unspecified: Secondary | ICD-10-CM

## 2013-05-05 LAB — CBC WITH DIFFERENTIAL/PLATELET
HCT: 34.8 % — ABNORMAL LOW (ref 36.0–46.0)
Hemoglobin: 12 g/dL (ref 12.0–15.0)
Lymphocytes Relative: 39 % (ref 12–46)
Lymphs Abs: 2.6 10*3/uL (ref 0.7–4.0)
MCHC: 34.5 g/dL (ref 30.0–36.0)
Monocytes Absolute: 0.9 10*3/uL (ref 0.1–1.0)
Monocytes Relative: 14 % — ABNORMAL HIGH (ref 3–12)
Neutro Abs: 2.8 10*3/uL (ref 1.7–7.7)
WBC: 6.5 10*3/uL (ref 4.0–10.5)

## 2013-05-05 LAB — ANTI-NUCLEAR AB-TITER (ANA TITER)

## 2013-05-05 NOTE — Telephone Encounter (Signed)
anemia

## 2013-05-05 NOTE — Telephone Encounter (Signed)
What dx for these labs CBC, ferritin and TIBC

## 2013-05-06 ENCOUNTER — Telehealth: Payer: Self-pay | Admitting: *Deleted

## 2013-05-06 DIAGNOSIS — R9431 Abnormal electrocardiogram [ECG] [EKG]: Secondary | ICD-10-CM

## 2013-05-06 LAB — IRON AND TIBC: UIBC: 284 ug/dL (ref 125–400)

## 2013-05-06 NOTE — Telephone Encounter (Signed)
Patient would like to know since her labs came back normal, does she still need to come in Tuesday of next week. She has never been seen by a cardiologist and is ok with being referred for the abnormal ECG.

## 2013-05-06 NOTE — Telephone Encounter (Signed)
We can cancel the follow up next week, but I would like to set her up for possible stress test given abnormal EKG. We should have follow up within 1 month.

## 2013-05-09 ENCOUNTER — Other Ambulatory Visit: Payer: Self-pay | Admitting: *Deleted

## 2013-05-09 ENCOUNTER — Other Ambulatory Visit (INDEPENDENT_AMBULATORY_CARE_PROVIDER_SITE_OTHER): Payer: Medicare Other

## 2013-05-09 DIAGNOSIS — Z1211 Encounter for screening for malignant neoplasm of colon: Secondary | ICD-10-CM

## 2013-05-09 NOTE — Telephone Encounter (Signed)
Patient spoke with Amber and her appt was cancelled.

## 2013-05-10 ENCOUNTER — Ambulatory Visit: Payer: Medicare Other | Admitting: Internal Medicine

## 2013-05-23 ENCOUNTER — Encounter: Payer: Self-pay | Admitting: Cardiovascular Disease

## 2013-05-23 ENCOUNTER — Ambulatory Visit (INDEPENDENT_AMBULATORY_CARE_PROVIDER_SITE_OTHER): Payer: Medicare Other | Admitting: Cardiovascular Disease

## 2013-05-23 VITALS — BP 178/77 | HR 78 | Ht <= 58 in | Wt 151.2 lb

## 2013-05-23 DIAGNOSIS — R0600 Dyspnea, unspecified: Secondary | ICD-10-CM

## 2013-05-23 DIAGNOSIS — I10 Essential (primary) hypertension: Secondary | ICD-10-CM

## 2013-05-23 DIAGNOSIS — I4510 Unspecified right bundle-branch block: Secondary | ICD-10-CM

## 2013-05-23 DIAGNOSIS — R9431 Abnormal electrocardiogram [ECG] [EKG]: Secondary | ICD-10-CM

## 2013-05-23 DIAGNOSIS — R011 Cardiac murmur, unspecified: Secondary | ICD-10-CM | POA: Insufficient documentation

## 2013-05-23 DIAGNOSIS — R0609 Other forms of dyspnea: Secondary | ICD-10-CM

## 2013-05-23 MED ORDER — AMLODIPINE BESY-BENAZEPRIL HCL 5-20 MG PO CAPS: 1.0000 | ORAL_CAPSULE | Freq: Every day | ORAL | Status: DC

## 2013-05-23 NOTE — Assessment & Plan Note (Signed)
The patient has a right bundle branch block of unknown duration. I explained to her the benign nature of this overall. Symptoms include mild exertional dyspnea without chest discomfort. I will obtain a treadmill stress test after controlling her blood pressure.

## 2013-05-23 NOTE — Patient Instructions (Addendum)
Make the following changes to your medications:  1. Stop Benazepril.  2. Start Benazepril/Amlodipine 20/5 mg once daily.   Your physician has requested that you have an echocardiogram. Echocardiography is a painless test that uses sound waves to create images of your heart. It provides your doctor with information about the size and shape of your heart and how well your heart's chambers and valves are working. This procedure takes approximately one hour. There are no restrictions for this procedure.  Your physician has requested that you have an exercise tolerance test. For further information please visit [email or web address] Please also follow instruction sheet, as given.   Follow up after tests.

## 2013-05-23 NOTE — Progress Notes (Signed)
HPI  This is a pleasant 79 year old female who was referred by Dr. Dan Humphreys for evaluation of an abnormal EKG. She has no previous cardiac history. She has known history of hypertension, hyperlipidemia and gastroesophageal reflux disease. She was noted recently to have right bundle branch block on her EKG. She denies any chest discomfort. She has mild exertional dyspnea without orthopnea or PND. No palpitations, syncope or presyncope. Blood pressure has been running high in the last few months. She is only on benazepril.  Allergies  Allergen Reactions  . Codeine Itching     Current Outpatient Prescriptions on File Prior to Visit  Medication Sig Dispense Refill  . acetaminophen (TYLENOL) 500 MG tablet Take 1,000 mg by mouth 2 (two) times daily.       Marland Kitchen aspirin EC 81 MG tablet Take 81 mg by mouth daily.        . Calcium-Vitamin D 600-200 MG-UNIT per tablet Take 2 tablets by mouth daily.        . cholecalciferol (VITAMIN D) 1000 UNITS tablet Take 1,000 Units by mouth daily.        . diphenhydrAMINE (BENADRYL) 25 MG tablet Take 25 mg by mouth daily.        . fish oil-omega-3 fatty acids 1000 MG capsule Take 1,000 mg by mouth 2 (two) times daily.       . Multiple Vitamin (MULTIVITAMIN) capsule Take 1 capsule by mouth daily.        Bernadette Hoit Sodium ([employer] STOOL SOFTENER/LAXATIVE PO) Take by mouth.      . simvastatin (ZOCOR) 20 MG tablet Take 1 tablet (20 mg total) by mouth at bedtime.  90 tablet  3  . vitamin C (ASCORBIC ACID) 500 MG tablet Take 500 mg by mouth daily.         No current facility-administered medications on file prior to visit.     Past Medical History  Diagnosis Date  . HTN (hypertension)   . Arthritis   . Chicken pox   . High cholesterol      Past Surgical History  Procedure Laterality Date  . Total hip arthroplasty  2005 & 2006    Bilateral, Andrew  . Appendectomy  1950  . Abdominal hysterectomy      in 50's  . Cesarean section      2      Family History  Problem Relation Age of Onset  . Arthritis Mother   . Heart disease Mother   . Arthritis Father   . Stroke Brother   . Heart disease Brother   . Cancer Neg Hx      History   Social History  . Marital Status: Married    Spouse Name: N/A    Number of Children: 2  . Years of Education: N/A   Occupational History  . Retired - Audiological scientist    Social History Main Topics  . Smoking status: Former Smoker    Types: Cigarettes    Quit date: 09/01/1993  . Smokeless tobacco: Never Used     Comment: Quit 30 years ago  . Alcohol Use: Yes     Comment: Occasional wine  . Drug Use: No  . Sexual Activity: Not on file   Other Topics Concern  . Not on file   Social History Narrative   Regular Exercise -  Active but no regular exercise routine   Daily Caffeine Use:  NO           ROS A 10  point  review of system was performed. It is negative other than what is mentioned in the history of present illness.  PHYSICAL EXAM   BP 178/77  Pulse 78  Ht 4\' 10"  (1.473 m)  Wt 151 lb 4 oz (68.607 kg)  BMI 31.62 kg/m2 Constitutional: She is oriented to person, place, and time. She appears well-developed and well-nourished. No distress.  HENT: No nasal discharge.  Head: Normocephalic and atraumatic.  Eyes: Pupils are equal and round. Right eye exhibits no discharge. Left eye exhibits no discharge.  Neck: Normal range of motion. Neck supple. No JVD present. No thyromegaly present.  Cardiovascular: Normal rate, regular rhythm, normal heart sounds. Exam reveals no gallop and no friction rub. There is a 2/6 systolic crescendo decrescendo murmur at the base of the heart which is mid peaking with preserved S2. The murmur is softer with Valsalva maneuver. Pulmonary/Chest: Effort normal and breath sounds normal. No stridor. No respiratory distress. She has no wheezes. She has no rales. She exhibits no tenderness.  Abdominal: Soft. Bowel sounds are normal. She exhibits no  distension. There is no tenderness. There is no rebound and no guarding.  Musculoskeletal: Normal range of motion. She exhibits no edema and no tenderness.  Neurological: She is alert and oriented to person, place, and time. Coordination normal.  Skin: Skin is warm and dry. No rash noted. She is not diaphoretic. No erythema. No pallor.  Psychiatric: She has a normal mood and affect. Her behavior is normal. Judgment and thought content normal.     EKG: Normal sinus rhythm with a right bundle branch block.   ASSESSMENT AND PLAN

## 2013-05-23 NOTE — Assessment & Plan Note (Signed)
The patient has a cardiac murmur suggestive of aortic stenosis which does not seem to be severe by physical exam. There has been no evaluation of this in the past. Thus, I recommend an echocardiogram.

## 2013-05-23 NOTE — Assessment & Plan Note (Signed)
Blood pressure is elevated today and has been running high in the last few months. Thus, I added amlodipine to benazepril for better control.

## 2013-06-02 ENCOUNTER — Encounter: Payer: Medicare Other | Admitting: Cardiovascular Disease

## 2013-06-07 ENCOUNTER — Other Ambulatory Visit (INDEPENDENT_AMBULATORY_CARE_PROVIDER_SITE_OTHER): Payer: Medicare Other

## 2013-06-07 ENCOUNTER — Other Ambulatory Visit: Payer: Self-pay

## 2013-06-07 DIAGNOSIS — R0609 Other forms of dyspnea: Secondary | ICD-10-CM

## 2013-06-07 DIAGNOSIS — R9431 Abnormal electrocardiogram [ECG] [EKG]: Secondary | ICD-10-CM

## 2013-06-07 DIAGNOSIS — R0600 Dyspnea, unspecified: Secondary | ICD-10-CM

## 2013-06-07 DIAGNOSIS — R011 Cardiac murmur, unspecified: Secondary | ICD-10-CM

## 2013-06-08 ENCOUNTER — Telehealth: Payer: Self-pay

## 2013-06-08 NOTE — Telephone Encounter (Signed)
Spoke w/ pt.  She is aware of results.  

## 2013-06-08 NOTE — Telephone Encounter (Signed)
Message copied by Marilynne Halsted on Wed Jun 08, 2013  9:57 AM ------      Message from: Lorine Bears A      Created: Tue Jun 07, 2013  5:34 PM       Inform patient that echo showed mild aortic valve stenosis which will need to be monitored. Otherwise, echo looked fine. ------

## 2013-06-13 ENCOUNTER — Encounter: Payer: Self-pay | Admitting: Cardiovascular Disease

## 2013-06-13 ENCOUNTER — Ambulatory Visit (INDEPENDENT_AMBULATORY_CARE_PROVIDER_SITE_OTHER): Payer: Medicare Other | Admitting: Cardiovascular Disease

## 2013-06-13 VITALS — BP 142/70 | HR 87 | Ht 59.0 in | Wt 151.2 lb

## 2013-06-13 DIAGNOSIS — R0600 Dyspnea, unspecified: Secondary | ICD-10-CM

## 2013-06-13 DIAGNOSIS — R0609 Other forms of dyspnea: Secondary | ICD-10-CM

## 2013-06-13 MED ORDER — AMLODIPINE BESY-BENAZEPRIL HCL 5-20 MG PO CAPS: 1.0000 | ORAL_CAPSULE | Freq: Every day | ORAL | Status: DC

## 2013-06-13 NOTE — Patient Instructions (Signed)
Your stress test is normal.   Your physician wants you to follow-up in: 12 months.  You will receive a reminder letter in the mail two months in advance. If you don't receive a letter, please call our office to schedule the follow-up appointment.

## 2013-06-13 NOTE — Procedures (Signed)
    Treadmill Stress test  Indication: Dyspnea and abnormal EKG  Baseline Data:  Resting EKG shows NSR with rate of 87 bpm, no significant ST or T wave changes. Right bundle branch block. Resting blood pressure of 142/70 mm Hg Stand bruce protocal was used.  Exercise Data:  Patient exercised for 3 min 30 sec,  Peak heart rate of 135 bpm.  This was 95 % of the maximum predicted heart rate. No symptoms of chest pain or lightheadedness were reported at peak stress or in recovery.  Peak Blood pressure recorded was 210/78 Maximal work level: 4.6 METs.  Heart rate at 3 minutes in recovery was 102 bpm. BP response: Hypertensive HR response: Normal  EKG with Exercise: Sinus tachycardia with 0.5 mm of upsloping ST depression in the inferior leads which is not diagnostic for ischemia.  FINAL IMPRESSION: Normal exercise stress test. No significant EKG changes concerning for ischemia. Average exercise tolerance.  Recommendation: Continue treatment of risk factors. Followup on a yearly basis due to mild aortic stenosis.

## 2013-06-16 ENCOUNTER — Telehealth: Payer: Self-pay | Admitting: Internal Medicine

## 2013-06-16 NOTE — Telephone Encounter (Signed)
Please read below, what would you recommend?

## 2013-06-16 NOTE — Telephone Encounter (Signed)
Pt called.  Advised to take OTC Tylenol.  Pt states she takes that now with no relief.  Appt made to see Raquel Monday 10/20.

## 2013-06-16 NOTE — Telephone Encounter (Signed)
She can use OTC tylenol. Or, we can see her in a visit to discuss prescription meds.

## 2013-06-16 NOTE — Telephone Encounter (Signed)
Is there anything that can be given for her sore joints.

## 2013-06-20 ENCOUNTER — Ambulatory Visit (INDEPENDENT_AMBULATORY_CARE_PROVIDER_SITE_OTHER): Payer: Medicare Other | Admitting: Adult Health

## 2013-06-20 ENCOUNTER — Encounter: Payer: Self-pay | Admitting: Adult Health

## 2013-06-20 VITALS — BP 140/60 | HR 81 | Temp 97.9°F | Resp 12 | Wt 151.5 lb

## 2013-06-20 DIAGNOSIS — M2550 Pain in unspecified joint: Secondary | ICD-10-CM

## 2013-06-20 MED ORDER — MELOXICAM 7.5 MG PO TABS: 7.5000 mg | ORAL_TABLET | Freq: Every day | ORAL | Status: DC

## 2013-06-20 NOTE — Progress Notes (Signed)
  Subjective:    Patient ID: Robin Alexander, female    DOB: 13-Jul-1933, 79 y.o.   MRN: [id number]  HPI  Robin Alexander is a very pleasant 79 year old female who presents to clinic with complaints of arthralgia. She has been having these symptoms for quite some time and was taking ibuprofen and Tylenol with some relief. These medications are no longer working for her. She has also been worked up for rheumatoid arthritis which was negative. Recent CBC was normal. Her creatinine level was also normal. Patient is requesting prescription medication to help with her arthralgia. She reports being a very active person and these pains are slowing her down.  Current Outpatient Prescriptions on File Prior to Visit  Medication Sig Dispense Refill  . acetaminophen (TYLENOL) 500 MG tablet Take 1,000 mg by mouth 2 (two) times daily.       Marland Kitchen amLODipine-benazepril (LOTREL) 5-20 MG per capsule Take 1 capsule by mouth daily.  90 capsule  3  . aspirin EC 81 MG tablet Take 81 mg by mouth daily.        . Calcium-Vitamin D 600-200 MG-UNIT per tablet Take 2 tablets by mouth daily.        . cholecalciferol (VITAMIN D) 1000 UNITS tablet Take 1,000 Units by mouth daily.        . diphenhydrAMINE (BENADRYL) 25 MG tablet Take 25 mg by mouth daily.        . fish oil-omega-3 fatty acids 1000 MG capsule Take 1,000 mg by mouth 2 (two) times daily.       . Multiple Vitamin (MULTIVITAMIN) capsule Take 1 capsule by mouth daily.        Bernadette Hoit Sodium ([employer] STOOL SOFTENER/LAXATIVE PO) Take by mouth.      . simvastatin (ZOCOR) 20 MG tablet Take 1 tablet (20 mg total) by mouth at bedtime.  90 tablet  3  . vitamin C (ASCORBIC ACID) 500 MG tablet Take 500 mg by mouth daily.         No current facility-administered medications on file prior to visit.     Review of Systems  Constitutional: Negative.   Gastrointestinal: Negative for nausea, abdominal pain, diarrhea and blood in stool.  Musculoskeletal: Positive for  arthralgias.       Objective:   Physical Exam  Constitutional: She is oriented to person, place, and time. She appears well-developed and well-nourished. No distress.  Cardiovascular: Normal rate and regular rhythm.   Pulmonary/Chest: Effort normal. No respiratory distress.  Musculoskeletal: She exhibits tenderness.  Tenderness with palpation at base of thumbs bilaterally. Heberden's and Bouchard's nodes present bilateral hands  Neurological: She is alert and oriented to person, place, and time.  Psychiatric: She has a normal mood and affect. Her behavior is normal. Judgment and thought content normal.          Assessment & Plan:

## 2013-06-20 NOTE — Patient Instructions (Signed)
  Start Mobic 7.5 mg daily. Avoid ibuprofen while taking this medication.  You may continue to take tylenol as needed. Do no exceed 3200 mg in a 24 hour period.

## 2013-06-20 NOTE — Assessment & Plan Note (Addendum)
Start Mobic 7.5 mg daily. Continue PPI. Take medication with food. Avoid ibuprofen or similar medications. May continue Tylenol not to exceed 3200 mg daily. Recent CBC and creatinine within normal limits

## 2013-08-15 ENCOUNTER — Other Ambulatory Visit: Payer: Self-pay | Admitting: Adult Health

## 2013-09-23 ENCOUNTER — Other Ambulatory Visit: Payer: Self-pay | Admitting: Internal Medicine

## 2013-10-11 ENCOUNTER — Other Ambulatory Visit: Payer: Self-pay | Admitting: Adult Health

## 2013-10-12 NOTE — Telephone Encounter (Signed)
Ok to refill 

## 2013-11-02 ENCOUNTER — Ambulatory Visit: Payer: Medicare Other | Admitting: Internal Medicine

## 2013-11-03 ENCOUNTER — Encounter: Payer: Self-pay | Admitting: Internal Medicine

## 2013-11-03 ENCOUNTER — Ambulatory Visit (INDEPENDENT_AMBULATORY_CARE_PROVIDER_SITE_OTHER): Payer: Medicare Other | Admitting: Internal Medicine

## 2013-11-03 VITALS — BP 122/78 | HR 70 | Temp 98.1°F | Wt 154.0 lb

## 2013-11-03 DIAGNOSIS — Z23 Encounter for immunization: Secondary | ICD-10-CM

## 2013-11-03 DIAGNOSIS — M2550 Pain in unspecified joint: Secondary | ICD-10-CM

## 2013-11-03 DIAGNOSIS — I350 Nonrheumatic aortic (valve) stenosis: Secondary | ICD-10-CM

## 2013-11-03 DIAGNOSIS — E785 Hyperlipidemia, unspecified: Secondary | ICD-10-CM

## 2013-11-03 DIAGNOSIS — I359 Nonrheumatic aortic valve disorder, unspecified: Secondary | ICD-10-CM

## 2013-11-03 DIAGNOSIS — I10 Essential (primary) hypertension: Secondary | ICD-10-CM

## 2013-11-03 LAB — COMPREHENSIVE METABOLIC PANEL
ALBUMIN: 4.1 g/dL (ref 3.5–5.2)
ALT: 22 U/L (ref 0–35)
AST: 23 U/L (ref 0–37)
Alkaline Phosphatase: 66 U/L (ref 39–117)
BUN: 27 mg/dL — AB (ref 6–23)
CHLORIDE: 102 meq/L (ref 96–112)
CO2: 27 meq/L (ref 19–32)
CREATININE: 1.2 mg/dL (ref 0.4–1.2)
Calcium: 9.6 mg/dL (ref 8.4–10.5)
GFR: 45.2 mL/min — AB (ref >60.00)
Glucose, Bld: 85 mg/dL (ref 70–99)
POTASSIUM: 5 meq/L (ref 3.5–5.1)
Sodium: 136 mEq/L (ref 135–145)
Total Bilirubin: 1 mg/dL (ref 0.3–1.2)
Total Protein: 7.2 g/dL (ref 6.0–8.3)

## 2013-11-03 MED ORDER — MELOXICAM 7.5 MG PO TABS: 7.5000 mg | ORAL_TABLET | Freq: Every day | ORAL | Status: DC

## 2013-11-03 NOTE — Assessment & Plan Note (Signed)
Will check lfts with labs today. Continue Simvastatin.

## 2013-11-03 NOTE — Progress Notes (Signed)
Pre visit review using our clinic review tool, if applicable. No additional management support is needed unless otherwise documented below in the visit note. 

## 2013-11-03 NOTE — Progress Notes (Signed)
Subjective:    Patient ID: Robin Alexander, female    DOB: 07/31/1933, 80 y.o.   MRN: [id number]  HPI 80YO female with HTN presents for follow up. Doing well. No concerns today except for occasional bilateral hip pain which is worse in colder weather. Takes Meloxicam with some improvement. No recent falls or injuries.   Review of Systems  Constitutional: Negative for fever, chills, appetite change, fatigue and unexpected weight change.  HENT: Negative for congestion, ear pain, sinus pressure, sore throat, trouble swallowing and voice change.   Eyes: Negative for visual disturbance.  Respiratory: Negative for cough, shortness of breath, wheezing and stridor.   Cardiovascular: Negative for chest pain, palpitations and leg swelling.  Gastrointestinal: Negative for nausea, vomiting, abdominal pain, diarrhea, constipation, blood in stool, abdominal distention and anal bleeding.  Genitourinary: Negative for dysuria and flank pain.  Musculoskeletal: Positive for arthralgias. Negative for gait problem, myalgias and neck pain.  Skin: Negative for color change and rash.  Neurological: Negative for dizziness and headaches.  Hematological: Negative for adenopathy. Does not bruise/bleed easily.  Psychiatric/Behavioral: Negative for suicidal ideas, sleep disturbance and dysphoric mood. The patient is not nervous/anxious.        Objective:    BP 122/78  Pulse 70  Temp(Src) 98.1 F (36.7 C) (Oral)  Wt 154 lb (69.854 kg)  SpO2 98% Physical Exam  Constitutional: She is oriented to person, place, and time. She appears well-developed and well-nourished. No distress.  HENT:  Head: Normocephalic and atraumatic.  Right Ear: External ear normal.  Left Ear: External ear normal.  Nose: Nose normal.  Mouth/Throat: Oropharynx is clear and moist. No oropharyngeal exudate.  Eyes: Conjunctivae are normal. Pupils are equal, round, and reactive to light. Right eye exhibits no discharge. Left eye exhibits  no discharge. No scleral icterus.  Neck: Normal range of motion. Neck supple. No tracheal deviation present. No thyromegaly present.  Cardiovascular: Normal rate, regular rhythm and intact distal pulses.  Exam reveals no gallop and no friction rub.   Murmur heard. Pulmonary/Chest: Effort normal and breath sounds normal. No accessory muscle usage. Not tachypneic. No respiratory distress. She has no decreased breath sounds. She has no wheezes. She has no rhonchi. She has no rales. She exhibits no tenderness.  Musculoskeletal: Normal range of motion. She exhibits no edema and no tenderness.  Lymphadenopathy:    She has no cervical adenopathy.  Neurological: She is alert and oriented to person, place, and time. No cranial nerve deficit. She exhibits normal muscle tone. Coordination normal.  Skin: Skin is warm and dry. No rash noted. She is not diaphoretic. No erythema. No pallor.  Psychiatric: She has a normal mood and affect. Her behavior is normal. Judgment and thought content normal.          Assessment & Plan:   Problem List Items Addressed This Visit   Arthralgia     Secondary to OA. Continue Meloxicam. Follow up prn if symptoms are not well controlled with medication.    Relevant Medications      meloxicam (MOBIC) tablet   Hyperlipidemia LDL goal < 100     Will check lfts with labs today. Continue Simvastatin.    Hypertension - Primary      BP Readings from Last 3 Encounters:  11/03/13 122/78  06/20/13 140/60  06/13/13 142/70   BP well controlled on Lotrel. Will continue.    Relevant Orders      Comp Met (CMET)      Microalbumin /  creatinine urine ratio    Other Visit Diagnoses   Need for prophylactic vaccination against Streptococcus pneumoniae (pneumococcus)        Relevant Orders       Pneumococcal conjugate vaccine 13-valent (Completed)        Return in about 6 months (around 05/06/2014) for Wellness Visit.

## 2013-11-03 NOTE — Assessment & Plan Note (Signed)
Secondary to OA. Continue Meloxicam. Follow up prn if symptoms are not well controlled with medication.

## 2013-11-03 NOTE — Assessment & Plan Note (Signed)
BP Readings from Last 3 Encounters:  11/03/13 122/78  06/20/13 140/60  06/13/13 142/70   BP well controlled on Lotrel. Will continue.

## 2013-11-04 ENCOUNTER — Encounter: Payer: Self-pay | Admitting: *Deleted

## 2013-11-04 ENCOUNTER — Telehealth: Payer: Self-pay | Admitting: Internal Medicine

## 2013-11-04 NOTE — Telephone Encounter (Signed)
Relevant patient education assigned to patient using Emmi. ° °

## 2013-12-27 ENCOUNTER — Ambulatory Visit: Payer: Self-pay | Admitting: Neurosurgery

## 2013-12-27 LAB — CREATININE, SERUM
CREATININE: 1.3 mg/dL (ref 0.60–1.30)
EGFR (African American): 45 — ABNORMAL LOW
EGFR (Non-African Amer.): 39 — ABNORMAL LOW

## 2014-01-09 ENCOUNTER — Other Ambulatory Visit: Payer: Self-pay | Admitting: Internal Medicine

## 2014-01-09 ENCOUNTER — Other Ambulatory Visit: Payer: Self-pay | Admitting: Cardiovascular Disease

## 2014-03-15 ENCOUNTER — Encounter: Payer: Self-pay | Admitting: Internal Medicine

## 2014-03-15 ENCOUNTER — Ambulatory Visit (INDEPENDENT_AMBULATORY_CARE_PROVIDER_SITE_OTHER): Payer: Medicare Other | Admitting: Internal Medicine

## 2014-03-15 ENCOUNTER — Telehealth: Payer: Self-pay | Admitting: *Deleted

## 2014-03-15 ENCOUNTER — Ambulatory Visit (INDEPENDENT_AMBULATORY_CARE_PROVIDER_SITE_OTHER)
Admission: RE | Admit: 2014-03-15 | Discharge: 2014-03-15 | Disposition: A | Discharge status: Home / Self Care | Payer: Medicare Other | Source: Ambulatory Visit | Attending: Internal Medicine | Admitting: Internal Medicine

## 2014-03-15 ENCOUNTER — Ambulatory Visit: Payer: Self-pay | Admitting: Internal Medicine

## 2014-03-15 VITALS — BP 130/60 | HR 88 | Temp 98.4°F | Ht <= 58 in | Wt 147.0 lb

## 2014-03-15 DIAGNOSIS — R0989 Other specified symptoms and signs involving the circulatory and respiratory systems: Secondary | ICD-10-CM

## 2014-03-15 DIAGNOSIS — R059 Cough, unspecified: Secondary | ICD-10-CM | POA: Insufficient documentation

## 2014-03-15 DIAGNOSIS — R509 Fever, unspecified: Secondary | ICD-10-CM

## 2014-03-15 DIAGNOSIS — R0609 Other forms of dyspnea: Secondary | ICD-10-CM

## 2014-03-15 DIAGNOSIS — R0600 Dyspnea, unspecified: Secondary | ICD-10-CM

## 2014-03-15 DIAGNOSIS — R05 Cough: Secondary | ICD-10-CM | POA: Insufficient documentation

## 2014-03-15 LAB — COMPREHENSIVE METABOLIC PANEL
ALBUMIN: 3.4 g/dL — AB (ref 3.5–5.2)
ALT: 17 U/L (ref 0–35)
AST: 24 U/L (ref 0–37)
Alkaline Phosphatase: 73 U/L (ref 39–117)
BUN: 27 mg/dL — AB (ref 6–23)
CALCIUM: 9.6 mg/dL (ref 8.4–10.5)
CHLORIDE: 99 meq/L (ref 96–112)
CO2: 28 meq/L (ref 19–32)
Creatinine, Ser: 1.1 mg/dL (ref 0.4–1.2)
GFR: 50.36 mL/min — AB (ref >60.00)
Glucose, Bld: 114 mg/dL — ABNORMAL HIGH (ref 70–99)
POTASSIUM: 4.4 meq/L (ref 3.5–5.1)
Sodium: 135 mEq/L (ref 135–145)
Total Bilirubin: 0.7 mg/dL (ref 0.2–1.2)
Total Protein: 7.4 g/dL (ref 6.0–8.3)

## 2014-03-15 LAB — CBC WITH DIFFERENTIAL/PLATELET
BASOS ABS: 0 10*3/uL (ref 0.0–0.1)
Basophils Relative: 0.5 % (ref 0.0–3.0)
EOS ABS: 0.4 10*3/uL (ref 0.0–0.7)
Eosinophils Relative: 6.1 % — ABNORMAL HIGH (ref 0.0–5.0)
HCT: 35 % — ABNORMAL LOW (ref 36.0–46.0)
Hemoglobin: 11.7 g/dL — ABNORMAL LOW (ref 12.0–15.0)
LYMPHS PCT: 15.9 % (ref 12.0–46.0)
Lymphs Abs: 1.1 10*3/uL (ref 0.7–4.0)
MCHC: 33.4 g/dL (ref 30.0–36.0)
MCV: 93.7 fl (ref 78.0–100.0)
Monocytes Absolute: 0.9 10*3/uL (ref 0.1–1.0)
Monocytes Relative: 12.4 % — ABNORMAL HIGH (ref 3.0–12.0)
NEUTROS PCT: 65.1 % (ref 43.0–77.0)
Neutro Abs: 4.6 10*3/uL (ref 1.4–7.7)
Platelets: 361 10*3/uL (ref 150.0–400.0)
RBC: 3.74 Mil/uL — ABNORMAL LOW (ref 3.87–5.11)
RDW: 13.3 % (ref 11.5–15.5)
WBC: 7.1 10*3/uL (ref 4.0–10.5)

## 2014-03-15 LAB — SEDIMENTATION RATE: SED RATE: 110 mm/h — AB (ref 0–22)

## 2014-03-15 LAB — C-REACTIVE PROTEIN: CRP: 18.9 mg/dL (ref 0.5–20.0)

## 2014-03-15 MED ORDER — LEVOFLOXACIN 500 MG PO TABS: 500.0000 mg | ORAL_TABLET | Freq: Every day | ORAL | Status: DC

## 2014-03-15 NOTE — Assessment & Plan Note (Signed)
Most consistent with bronchitis or early pneumonia. Will start Levaquin. Check CBC, CMP. Chest xray showed no acute opacity. Given mild hypoxia, will also get Chest CT to look for PE.

## 2014-03-15 NOTE — Telephone Encounter (Signed)
Spoke with pt advised of CT report as per MD.  Verbalized understanding.

## 2014-03-15 NOTE — Telephone Encounter (Signed)
Robin Alexander from [facility] CT called to report no evidence of Pulmonary Embolis, diffuse bilateral interstitial prominence although a component of this may be related to Chronic Interstitial Lung Disease and active process including pneumonitis could also present in this fashion.  Coronary Artery Disease Calcified 7mm Pulmonary nodule right middle lobe consistent with granuloma gallstones bilateral renal atherosclerotic vascular disease.

## 2014-03-15 NOTE — Telephone Encounter (Signed)
Can you please let pt know that the preliminary report on the CT showed no blood clots, however there does appear to be infection (bronchitis). I would recommend completing the antibiotics as we discussed.

## 2014-03-15 NOTE — Patient Instructions (Signed)
Labs today.  Chest xray today.  Start Levaquin 500mg  daily for 7 days.  Follow up on Friday or sooner if symptoms are not improving.

## 2014-03-15 NOTE — Assessment & Plan Note (Signed)
Chest xray normal. Given dyspnea, mild hypoxia, will get chest ct to evaluate for PE.

## 2014-03-15 NOTE — Progress Notes (Signed)
Pre visit review using our clinic review tool, if applicable. No additional management support is needed unless otherwise documented below in the visit note. 

## 2014-03-15 NOTE — Progress Notes (Signed)
Subjective:    Patient ID: Robin Alexander, female    DOB: November 25, 1932, 80 y.o.   MRN: [id number]  HPI 80YO female presents for acute visit.  Had two pain injections, ESI, for chronic low back pain. After last injection, had severe bilateral hip pain. Was evaluated by ortho for hip pain and diagnosed with bursitis. Treated with oral Prednisone. Now off Prednisone for 2 weeks. Has no appetite, has low grade temps at night. Chills at night Feels "awful." Occasional cough and dyspnea, no congestion. Continues to have bilateral hip pain. Continues to have chronic aching back pain.  Review of Systems  Constitutional: Positive for fever, chills and fatigue. Negative for appetite change and unexpected weight change.  HENT: Negative for congestion, postnasal drip, rhinorrhea, sinus pressure and sore throat.   Eyes: Negative for visual disturbance.  Respiratory: Positive for cough and shortness of breath.   Cardiovascular: Negative for chest pain and leg swelling.  Gastrointestinal: Negative for nausea, vomiting, abdominal pain, diarrhea and constipation.  Musculoskeletal: Positive for arthralgias, back pain and myalgias.  Skin: Negative for color change and rash.  Hematological: Negative for adenopathy. Does not bruise/bleed easily.  Psychiatric/Behavioral: Positive for sleep disturbance. Negative for dysphoric mood. The patient is not nervous/anxious.        Objective:    BP 130/60  Pulse 88  Temp(Src) 98.4 F (36.9 C) (Oral)  Ht _0  (1.473 m)  Wt 147 lb (66.679 kg)  BMI 30.73 kg/m2  SpO2 91% Physical Exam  Constitutional: She is oriented to person, place, and time. She appears well-developed and well-nourished. No distress.  HENT:  Head: Normocephalic and atraumatic.  Right Ear: External ear normal.  Left Ear: External ear normal.  Nose: Nose normal.  Mouth/Throat: Oropharynx is clear and moist. No oropharyngeal exudate.  Eyes: Conjunctivae are normal. Pupils are equal,  round, and reactive to light. Right eye exhibits no discharge. Left eye exhibits no discharge. No scleral icterus.  Neck: Normal range of motion. Neck supple. No tracheal deviation present. No thyromegaly present.  Cardiovascular: Normal rate, regular rhythm and intact distal pulses.  Exam reveals no gallop and no friction rub.   Murmur heard. Pulmonary/Chest: No accessory muscle usage. Not tachypneic. No respiratory distress. She has no decreased breath sounds. She has no wheezes. She has rhonchi in the right lower field, the left middle field and the left lower field. She has no rales. She exhibits no tenderness.  Musculoskeletal: Normal range of motion. She exhibits no edema and no tenderness.  Lymphadenopathy:    She has no cervical adenopathy.  Neurological: She is alert and oriented to person, place, and time. No cranial nerve deficit. She exhibits normal muscle tone. Coordination normal.  Skin: Skin is warm and dry. No rash noted. She is not diaphoretic. No erythema. No pallor.  Psychiatric: She has a normal mood and affect. Her behavior is normal. Judgment and thought content normal.          Assessment & Plan:   Problem List Items Addressed This Visit     Unprioritized   Cough     Most consistent with bronchitis or early pneumonia. Will start Levaquin. Check CBC, CMP. Chest xray showed no acute opacity. Given mild hypoxia, will also get Chest CT to look for PE.    Relevant Medications      levofloxacin (LEVAQUIN) tablet   Dyspnea     Chest xray normal. Given dyspnea, mild hypoxia, will get chest ct to evaluate for PE.  Relevant Orders      CT Angio Chest W/Cm &/Or Wo Cm   Fever, unspecified - Primary     Recent fever, chills, cough most consistent with bronchitis or early pneumonia. CXR today showed no focal opacities. Will start Levaquin. Will check CBC, CMP, blood culture today. Follow up in 2 days.    Relevant Orders      Comprehensive metabolic panel      CBC  w/Diff      Sed Rate (ESR)      C-reactive protein      POCT Urinalysis Dipstick      Blood culture (routine single)      DG Chest 2 View (Completed)       Return in about 2 days (around 03/17/2014).

## 2014-03-15 NOTE — Assessment & Plan Note (Signed)
Recent fever, chills, cough most consistent with bronchitis or early pneumonia. CXR today showed no focal opacities. Will start Levaquin. Will check CBC, CMP, blood culture today. Follow up in 2 days.

## 2014-03-17 ENCOUNTER — Telehealth: Payer: Self-pay | Admitting: Internal Medicine

## 2014-03-17 ENCOUNTER — Ambulatory Visit (INDEPENDENT_AMBULATORY_CARE_PROVIDER_SITE_OTHER): Payer: Medicare Other | Admitting: Internal Medicine

## 2014-03-17 ENCOUNTER — Encounter: Payer: Self-pay | Admitting: Internal Medicine

## 2014-03-17 VITALS — BP 112/58 | HR 91 | Temp 98.6°F | Wt 145.5 lb

## 2014-03-17 DIAGNOSIS — J209 Acute bronchitis, unspecified: Secondary | ICD-10-CM

## 2014-03-17 DIAGNOSIS — G4700 Insomnia, unspecified: Secondary | ICD-10-CM

## 2014-03-17 MED ORDER — PREDNISONE 10 MG PO TABS: ORAL_TABLET | ORAL | Status: DC

## 2014-03-17 MED ORDER — ALPRAZOLAM 0.25 MG PO TABS: 0.2500 mg | ORAL_TABLET | Freq: Every evening | ORAL | Status: DC | PRN

## 2014-03-17 NOTE — Patient Instructions (Signed)
Continue Levaquin.  Start Prednisone taper.  We will set up an evaluation with pulmonary medicine.  Please avoid cleaning products such as Clorox.  Follow up in 3 weeks.

## 2014-03-17 NOTE — Progress Notes (Signed)
   Subjective:    Patient ID: Robin Alexander, female    DOB: 30-Dec-1932, 80 y.o.   MRN: [id number]  HPI 80YO female presents for follow up. Recently treated for bronchitis on 7/15 with Levaquin. CXR was normal. CT chest showed no acute infiltrate and no PE. Findings suggested pneumonitis.  Feeling better. Shortness of breath has improved. Cough is better. Had subjective fever last night. Continues to feel tired. Continues on Levaquin.  No previous h/o asthma. Former smoker, quit over 30 years ago. Does acknowledge spraying her bathrooms/kitchens with Clorox at least weekly.  Review of Systems  Constitutional: Positive for fever and fatigue. Negative for chills, appetite change and unexpected weight change.  Eyes: Negative for visual disturbance.  Respiratory: Positive for cough and shortness of breath. Negative for chest tightness and wheezing.   Cardiovascular: Negative for chest pain, palpitations and leg swelling.  Gastrointestinal: Negative for abdominal pain.  Skin: Negative for color change and rash.  Hematological: Negative for adenopathy. Does not bruise/bleed easily.  Psychiatric/Behavioral: Negative for dysphoric mood. The patient is not nervous/anxious.        Objective:    BP 112/58  Pulse 91  Temp(Src) 98.6 F (37 C) (Oral)  Wt 145 lb 8 oz (65.998 kg)  SpO2 84% Physical Exam  Constitutional: She is oriented to person, place, and time. She appears well-developed and well-nourished. No distress.  HENT:  Head: Normocephalic and atraumatic.  Right Ear: External ear normal.  Left Ear: External ear normal.  Nose: Nose normal.  Mouth/Throat: Oropharynx is clear and moist. No oropharyngeal exudate.  Eyes: Conjunctivae are normal. Pupils are equal, round, and reactive to light. Right eye exhibits no discharge. Left eye exhibits no discharge. No scleral icterus.  Neck: Normal range of motion. Neck supple. No tracheal deviation present. No thyromegaly present.    Cardiovascular: Normal rate, regular rhythm, normal heart sounds and intact distal pulses.  Exam reveals no gallop and no friction rub.   No murmur heard. Pulmonary/Chest: Effort normal. No accessory muscle usage. Not tachypneic. No respiratory distress. She has no decreased breath sounds. She has wheezes (few scattered). She has rhonchi. She has no rales. She exhibits no tenderness.  Musculoskeletal: Normal range of motion. She exhibits no edema and no tenderness.  Lymphadenopathy:    She has no cervical adenopathy.  Neurological: She is alert and oriented to person, place, and time. No cranial nerve deficit. She exhibits normal muscle tone. Coordination normal.  Skin: Skin is warm and dry. No rash noted. She is not diaphoretic. No erythema. No pallor.  Psychiatric: She has a normal mood and affect. Her behavior is normal. Judgment and thought content normal.          Assessment & Plan:   Problem List Items Addressed This Visit   None       No Follow-up on file.

## 2014-03-17 NOTE — Progress Notes (Signed)
Pre visit review using our clinic review tool, if applicable. No additional management support is needed unless otherwise documented below in the visit note. 

## 2014-03-17 NOTE — Telephone Encounter (Signed)
Pt needs 3 week for follow up (43min) appt. Please advise where to add pt/msn

## 2014-03-19 DIAGNOSIS — J209 Acute bronchitis, unspecified: Secondary | ICD-10-CM | POA: Insufficient documentation

## 2014-03-19 NOTE — Assessment & Plan Note (Signed)
Recent symptoms and exam most consistent with acute bronchitis. Symptoms are improving with Levaquin. Will add Prednisone taper given wheezing noted on exam today. Findings on CT suggested diffuse pneumonitis. Will set up pulmonary evaluation. Question if PFTs might be helpful to rule out any underlying lung disease. Encouraged her to stop using Clorox for cleaning.

## 2014-03-20 ENCOUNTER — Encounter: Payer: Self-pay | Admitting: Pulmonary Disease

## 2014-03-20 ENCOUNTER — Ambulatory Visit (INDEPENDENT_AMBULATORY_CARE_PROVIDER_SITE_OTHER): Payer: Medicare Other | Admitting: Pulmonary Disease

## 2014-03-20 VITALS — BP 138/76 | HR 91 | Ht 60.0 in | Wt 149.0 lb

## 2014-03-20 DIAGNOSIS — R9389 Abnormal findings on diagnostic imaging of other specified body structures: Secondary | ICD-10-CM | POA: Insufficient documentation

## 2014-03-20 DIAGNOSIS — J209 Acute bronchitis, unspecified: Secondary | ICD-10-CM

## 2014-03-20 HISTORY — DX: Abnormal findings on diagnostic imaging of other specified body structures: R93.89

## 2014-03-20 NOTE — Progress Notes (Signed)
Subjective:    Patient ID: Robin Alexander, female    DOB: 1932-12-12, 80 y.o.   MRN: [id number]  HPI  This is a 80 year old female with no significant pulmonary history in the past he comes to my clinic today for a patient of an abnormal CT chest performed a week ago. She tells me that she smoked three quarters of a pack of cigarettes daily for about 30 years and quit in 1985. Despite that she has never been given a pulmonary diagnosis in the past. She had a normal childhood without respiratory illnesses. She says they're the years she has had would be expected in terms of seasonal colds and coughs but she has never had a persistent pulmonary problem.  In late late June she was evaluated for back pain and underwent an epidural injection. After the second injection she said she had immediate pain in her hips and legs in situ at her orthopedic surgeon for further evaluation. She was told that she had possible trochanteric bursitis and was treated with prednisone. After completing a course of prednisone she developed fatigue, fever, and chills. She did not have respiratory problems. This symptom persisted and she went to go see her primary care physician. There, she was found to have a low oxygen saturation and she was sent for a chest x-ray. This was normal. She then was sent for a CT scan of the chest which showed bilateral air trapping and scattered mild groundglass.  After that CT scan she was started on prednisone and Levaquin on 03/18/2014. She's been taking the medicine since then and she says that her energy level has significantly improved. Throughout this illness she has had some thick sinus congestion and mucus but she has not had any respiratory complaints. She denies shortness of breath or cough or chest congestion.  Past Medical History  Diagnosis Date  . HTN (hypertension)   . Arthritis   . Chicken pox   . High cholesterol      Family History  Problem Relation Age of Onset  .  Arthritis Mother   . Heart disease Mother   . Arthritis Father   . Stroke Brother   . Heart disease Brother   . Cancer Neg Hx      History   Social History  . Marital Status: Married    Spouse Name: N/A    Number of Children: 2  . Years of Education: N/A   Occupational History  . Retired - Press photographer    Social History Main Topics  . Smoking status: Former Smoker -- 0.75 packs/day for 20 years    Types: Cigarettes    Quit date: 09/02/1983  . Smokeless tobacco: Never Used  . Alcohol Use: Yes     Comment: Occasional wine  . Drug Use: No  . Sexual Activity: Not on file   Other Topics Concern  . Not on file   Social History Narrative   Regular Exercise -  Active but no regular exercise routine   Daily Caffeine Use:  NO           Allergies  Allergen Reactions  . Latex     hives  . Codeine Itching     Outpatient Prescriptions Prior to Visit  Medication Sig Dispense Refill  . acetaminophen (TYLENOL) 500 MG tablet Take 1,000 mg by mouth 2 (two) times daily.       Marland Kitchen ALPRAZolam (XANAX) 0.25 MG tablet Take 1 tablet (0.25 mg total) by mouth at bedtime as  needed for anxiety or sleep.  30 tablet  0  . amLODipine-benazepril (LOTREL) 5-20 MG per capsule Take 1 capsule by mouth daily.  90 capsule  3  . aspirin EC 81 MG tablet Take 81 mg by mouth daily.        . cholecalciferol (VITAMIN D) 1000 UNITS tablet Take 1,000 Units by mouth daily.        . diphenhydrAMINE (BENADRYL) 25 MG tablet Take 25 mg by mouth daily.        . fish oil-omega-3 fatty acids 1000 MG capsule Take 1,000 mg by mouth 2 (two) times daily.       Marland Kitchen levofloxacin (LEVAQUIN) 500 MG tablet Take 1 tablet (500 mg total) by mouth daily.  7 tablet  0  . meloxicam (MOBIC) 7.5 MG tablet Take 1 tablet (7.5 mg total) by mouth daily.  90 tablet  3  . Multiple Vitamin (MULTIVITAMIN) capsule Take 1 capsule by mouth daily.        Marland Kitchen omeprazole (PRILOSEC) 40 MG capsule Take 1 capsule by mouth  daily  90 capsule  1  .  predniSONE (DELTASONE) 10 MG tablet Take 60mg  by mouth on day 1, then taper by 10mg  daily until gone  21 tablet  0  . Sennosides-Docusate Sodium ([employer] STOOL SOFTENER/LAXATIVE PO) Take by mouth 3 (three) times daily.       . simvastatin (ZOCOR) 20 MG tablet Take 1 tablet by mouth at  bedtime  90 tablet  1  . vitamin C (ASCORBIC ACID) 500 MG tablet Take 500 mg by mouth daily.         No facility-administered medications prior to visit.      Review of Systems  Constitutional: Negative for fever and unexpected weight change.  HENT: Negative for congestion, dental problem, ear pain, nosebleeds, postnasal drip, rhinorrhea, sinus pressure, sneezing, sore throat and trouble swallowing.   Eyes: Negative for redness and itching.  Respiratory: Negative for cough, chest tightness, shortness of breath and wheezing.   Cardiovascular: Negative for palpitations and leg swelling.  Gastrointestinal: Negative for nausea and vomiting.  Genitourinary: Negative for dysuria.  Musculoskeletal: Negative for joint swelling.  Skin: Negative for rash.  Neurological: Negative for headaches.  Hematological: Does not bruise/bleed easily.  Psychiatric/Behavioral: Negative for dysphoric mood. The patient is not nervous/anxious.        Objective:   Physical Exam Filed Vitals:   03/20/14 1351  BP: 138/76  Pulse: 91  Height: 5' (1.524 m)  Weight: 149 lb (67.586 kg)  SpO2: 95%  RA  Ambulated 500 feet on RA and O2 saturation stayed between 96-97% the entire walk  Gen: well appearing, no acute distress HEENT: NCAT, PERRL, EOMi, OP clear, neck supple without masses PULM: Crackles in bases bilaterally CV: RRR, loud systolic murmur, no JVD AB: BS+, soft, nontender, no hsm Ext: warm, no edema, no clubbing, no cyanosis Derm: no rash or skin breakdown Neuro: A&Ox4, CN II-XII intact, strength 5/5 in all 4 extremities  2015 CT chest > Bilateral ground glass opacities with air trapping more significant in the upper  lobe, no pleural effusion, no lymphadenopathy, RML 6 mm calcified nodule      Assessment & Plan:   Abnormal CT scan, chest I have personally reviewed the images from the CT scan from last week. There are very mild groundglass opacities in a patchy distribution in the upper lobes more than the lower lobes with clear air trapping. The differential diagnosis for a CT scan like this  with her recent fatigue and fever is quite broad. The most likely etiologies include viral versus atypical bacterial pulmonary infections. Hypersensitivity pneumonitis and less likely an eosinophilic process could also be considered but both of these are much less likely.  At this point because she is improving I don't recommend that we make any changes. I have explained to her the natural history of pneumonia and later noted that she should continue to improve over the next 3-4 weeks. I will see her in 4 weeks and get a chest x-ray at that point. I explained to her that if her symptoms worsen then I need to see her sooner than that for repeat chest imaging and office visit.  Plan: -Continue Levaquin and prednisone as prescribed by Dr. walker -Followup with me in 4 weeks with a chest x-ray   Updated Medication List Outpatient Encounter Prescriptions as of 03/20/2014  Medication Sig  . acetaminophen (TYLENOL) 500 MG tablet Take 1,000 mg by mouth 2 (two) times daily.   Marland Kitchen ALPRAZolam (XANAX) 0.25 MG tablet Take 1 tablet (0.25 mg total) by mouth at bedtime as needed for anxiety or sleep.  Marland Kitchen amLODipine-benazepril (LOTREL) 5-20 MG per capsule Take 1 capsule by mouth daily.  Marland Kitchen aspirin EC 81 MG tablet Take 81 mg by mouth daily.    . cholecalciferol (VITAMIN D) 1000 UNITS tablet Take 1,000 Units by mouth daily.    . diphenhydrAMINE (BENADRYL) 25 MG tablet Take 25 mg by mouth daily.    . fish oil-omega-3 fatty acids 1000 MG capsule Take 1,000 mg by mouth 2 (two) times daily.   Marland Kitchen levofloxacin (LEVAQUIN) 500 MG tablet Take 1  tablet (500 mg total) by mouth daily.  . meloxicam (MOBIC) 7.5 MG tablet Take 1 tablet (7.5 mg total) by mouth daily.  . Multiple Vitamin (MULTIVITAMIN) capsule Take 1 capsule by mouth daily.    Marland Kitchen omeprazole (PRILOSEC) 40 MG capsule Take 1 capsule by mouth  daily  . predniSONE (DELTASONE) 10 MG tablet Take 60mg  by mouth on day 1, then taper by 10mg  daily until gone  . Sennosides-Docusate Sodium ([employer] STOOL SOFTENER/LAXATIVE PO) Take by mouth 3 (three) times daily.   . simvastatin (ZOCOR) 20 MG tablet Take 1 tablet by mouth at  bedtime  . vitamin C (ASCORBIC ACID) 500 MG tablet Take 500 mg by mouth daily.

## 2014-03-20 NOTE — Patient Instructions (Signed)
WE will arrange a Chest X-ray for 4 week from now and see you after that Let us know if your symptoms worsen before then

## 2014-03-20 NOTE — Assessment & Plan Note (Signed)
I have personally reviewed the images from the CT scan from last week. There are very mild groundglass opacities in a patchy distribution in the upper lobes more than the lower lobes with clear air trapping. The differential diagnosis for a CT scan like this with her recent fatigue and fever is quite broad. The most likely etiologies include viral versus atypical bacterial pulmonary infections. Hypersensitivity pneumonitis and less likely an eosinophilic process could also be considered but both of these are much less likely.  At this point because she is improving I don't recommend that we make any changes. I have explained to her the natural history of pneumonia and later noted that she should continue to improve over the next 3-4 weeks. I will see her in 4 weeks and get a chest x-ray at that point. I explained to her that if her symptoms worsen then I need to see her sooner than that for repeat chest imaging and office visit.  Plan: -Continue Levaquin and prednisone as prescribed by Dr. walker -Followup with me in 4 weeks with a chest x-ray

## 2014-03-21 LAB — CULTURE, BLOOD (SINGLE): ORGANISM ID, BACTERIA: NO GROWTH

## 2014-03-23 ENCOUNTER — Encounter: Payer: Self-pay | Admitting: Internal Medicine

## 2014-04-10 ENCOUNTER — Telehealth: Payer: Self-pay | Admitting: Pulmonary Disease

## 2014-04-10 DIAGNOSIS — J209 Acute bronchitis, unspecified: Secondary | ICD-10-CM

## 2014-04-10 NOTE — Telephone Encounter (Signed)
Called and spoke to pt. Pt stated she needed a cxr before her next appt. Pt saw BQ on 7/20 and informed pt that she will need an xray before her next appt. Advised pt that we will send her down to get a cxr right before her appt and to come 10-15 minutes before app time. Pt verbalized understanding and denied any further questions or concerns at this time. CXR order has already been placed. Nothing further needed.

## 2014-04-17 ENCOUNTER — Ambulatory Visit: Payer: Medicare Other | Admitting: Pulmonary Disease

## 2014-04-20 ENCOUNTER — Ambulatory Visit (INDEPENDENT_AMBULATORY_CARE_PROVIDER_SITE_OTHER): Payer: Medicare Other | Admitting: Internal Medicine

## 2014-04-20 ENCOUNTER — Encounter: Payer: Self-pay | Admitting: Internal Medicine

## 2014-04-20 VITALS — BP 112/60 | HR 88 | Temp 98.7°F | Ht <= 58 in | Wt 147.8 lb

## 2014-04-20 DIAGNOSIS — J209 Acute bronchitis, unspecified: Secondary | ICD-10-CM

## 2014-04-20 NOTE — Assessment & Plan Note (Signed)
Symptoms have completely resolved. Pt will follow up and have CXR and pulmonary evaluation as scheduled. Follow up for Wellness Visit in 1 month.

## 2014-04-20 NOTE — Progress Notes (Signed)
   Subjective:    Patient ID: Robin Alexander, female    DOB: December 23, 1932, 80 y.o.   MRN: [id number]  HPI 80YO female presents for follow up of acute bronchitis. Completed Prednisone and Levaquin. Feeling better. No cough. No dyspnea.  No new concerns today.  Review of Systems  Constitutional: Negative for fever, chills and unexpected weight change.  HENT: Negative for congestion, ear discharge, ear pain, facial swelling, hearing loss, mouth sores, nosebleeds, postnasal drip, rhinorrhea, sinus pressure, sneezing, sore throat, tinnitus, trouble swallowing and voice change.   Eyes: Negative for pain, discharge, redness and visual disturbance.  Respiratory: Negative for cough, chest tightness, shortness of breath, wheezing and stridor.   Cardiovascular: Negative for chest pain, palpitations and leg swelling.  Musculoskeletal: Negative for arthralgias, myalgias, neck pain and neck stiffness.  Skin: Negative for color change and rash.  Neurological: Negative for dizziness, weakness, light-headedness and headaches.  Hematological: Negative for adenopathy.       Objective:    BP 112/60  Pulse 88  Temp(Src) 98.7 F (37.1 C) (Oral)  Ht 4\' 10"  (1.473 m)  Wt 147 lb 12 oz (67.019 kg)  BMI 30.89 kg/m2  SpO2 96% Physical Exam  Constitutional: She is oriented to person, place, and time. She appears well-developed and well-nourished. No distress.  HENT:  Head: Normocephalic and atraumatic.  Right Ear: External ear normal.  Left Ear: External ear normal.  Nose: Nose normal.  Mouth/Throat: Oropharynx is clear and moist. No oropharyngeal exudate.  Eyes: Conjunctivae are normal. Pupils are equal, round, and reactive to light. Right eye exhibits no discharge. Left eye exhibits no discharge. No scleral icterus.  Neck: Normal range of motion. Neck supple. No tracheal deviation present. No thyromegaly present.  Cardiovascular: Normal rate, regular rhythm, normal heart sounds and intact distal  pulses.  Exam reveals no gallop and no friction rub.   No murmur heard. Pulmonary/Chest: Effort normal and breath sounds normal. No accessory muscle usage. Not tachypneic. No respiratory distress. She has no decreased breath sounds. She has no wheezes. She has no rhonchi. She has no rales. She exhibits no tenderness.  Musculoskeletal: Normal range of motion. She exhibits no edema and no tenderness.  Lymphadenopathy:    She has no cervical adenopathy.  Neurological: She is alert and oriented to person, place, and time. No cranial nerve deficit. She exhibits normal muscle tone. Coordination normal.  Skin: Skin is warm and dry. No rash noted. She is not diaphoretic. No erythema. No pallor.  Psychiatric: She has a normal mood and affect. Her behavior is normal. Judgment and thought content normal.          Assessment & Plan:   Problem List Items Addressed This Visit     Unprioritized   Acute bronchitis - Primary     Symptoms have completely resolved. Pt will follow up and have CXR and pulmonary evaluation as scheduled. Follow up for Wellness Visit in 1 month.        Return in about 3 months (around 07/21/2014) for Recheck.

## 2014-04-20 NOTE — Patient Instructions (Signed)
Follow up as scheduled with Dr. Lake Bells.

## 2014-04-20 NOTE — Progress Notes (Signed)
Pre visit review using our clinic review tool, if applicable. No additional management support is needed unless otherwise documented below in the visit note. 

## 2014-04-27 ENCOUNTER — Other Ambulatory Visit (HOSPITAL_COMMUNITY): Payer: Self-pay | Admitting: Orthopedic Surgery

## 2014-04-27 DIAGNOSIS — Z96649 Presence of unspecified artificial hip joint: Principal | ICD-10-CM

## 2014-04-27 DIAGNOSIS — T84038A Mechanical loosening of other internal prosthetic joint, initial encounter: Secondary | ICD-10-CM

## 2014-04-27 DIAGNOSIS — M8430XA Stress fracture, unspecified site, initial encounter for fracture: Secondary | ICD-10-CM

## 2014-04-27 DIAGNOSIS — M79606 Pain in leg, unspecified: Secondary | ICD-10-CM

## 2014-05-09 ENCOUNTER — Other Ambulatory Visit (HOSPITAL_COMMUNITY): Payer: Self-pay | Admitting: Orthopedic Surgery

## 2014-05-09 ENCOUNTER — Encounter (HOSPITAL_COMMUNITY)
Admission: RE | Admit: 2014-05-09 | Discharge: 2014-05-09 | Disposition: A | Discharge status: Home / Self Care | Payer: Medicare Other | Source: Ambulatory Visit | Attending: Orthopedic Surgery | Admitting: Orthopedic Surgery

## 2014-05-09 ENCOUNTER — Encounter (HOSPITAL_COMMUNITY)
Admission: RE | Admit: 2014-05-09 | Discharge: 2014-05-09 | Disposition: A | Discharge status: Home / Self Care | Payer: Medicare Other | Source: Ambulatory Visit | Attending: Diagnostic Radiology | Admitting: Diagnostic Radiology

## 2014-05-09 ENCOUNTER — Ambulatory Visit (HOSPITAL_COMMUNITY)
Admission: RE | Admit: 2014-05-09 | Discharge: 2014-05-09 | Disposition: A | Discharge status: Home / Self Care | Payer: Medicare Other | Source: Ambulatory Visit | Attending: Orthopedic Surgery | Admitting: Orthopedic Surgery

## 2014-05-09 ENCOUNTER — Inpatient Hospital Stay
Admission: RE | Admit: 2014-05-09 | Discharge: 2014-05-09 | Disposition: A | Discharge status: Home / Self Care | Payer: Self-pay | Source: Ambulatory Visit | Attending: Orthopedic Surgery | Admitting: Orthopedic Surgery

## 2014-05-09 DIAGNOSIS — Y831 Surgical operation with implant of artificial internal device as the cause of abnormal reaction of the patient, or of later complication, without mention of misadventure at the time of the procedure: Secondary | ICD-10-CM | POA: Diagnosis not present

## 2014-05-09 DIAGNOSIS — M79606 Pain in leg, unspecified: Secondary | ICD-10-CM

## 2014-05-09 DIAGNOSIS — T84039A Mechanical loosening of unspecified internal prosthetic joint, initial encounter: Secondary | ICD-10-CM | POA: Insufficient documentation

## 2014-05-09 DIAGNOSIS — Z96649 Presence of unspecified artificial hip joint: Secondary | ICD-10-CM | POA: Insufficient documentation

## 2014-05-09 DIAGNOSIS — T84038A Mechanical loosening of other internal prosthetic joint, initial encounter: Secondary | ICD-10-CM

## 2014-05-09 DIAGNOSIS — Y929 Unspecified place or not applicable: Secondary | ICD-10-CM | POA: Insufficient documentation

## 2014-05-09 DIAGNOSIS — R52 Pain, unspecified: Secondary | ICD-10-CM

## 2014-05-09 DIAGNOSIS — R948 Abnormal results of function studies of other organs and systems: Secondary | ICD-10-CM | POA: Insufficient documentation

## 2014-05-09 DIAGNOSIS — M8430XA Stress fracture, unspecified site, initial encounter for fracture: Secondary | ICD-10-CM

## 2014-05-09 DIAGNOSIS — M79609 Pain in unspecified limb: Secondary | ICD-10-CM | POA: Insufficient documentation

## 2014-05-09 MED ORDER — TECHNETIUM TC 99M MEDRONATE IV KIT
26.0000 | PACK | Freq: Once | INTRAVENOUS | Status: AC | PRN
  Administered 2014-05-09: 26 via INTRAVENOUS

## 2014-05-15 ENCOUNTER — Encounter: Payer: Self-pay | Admitting: Internal Medicine

## 2014-05-15 ENCOUNTER — Ambulatory Visit (INDEPENDENT_AMBULATORY_CARE_PROVIDER_SITE_OTHER): Payer: Medicare Other | Admitting: Internal Medicine

## 2014-05-15 VITALS — BP 110/62 | HR 77 | Temp 98.2°F | Ht 58.5 in | Wt 148.5 lb

## 2014-05-15 DIAGNOSIS — Z0000 Encounter for general adult medical examination without abnormal findings: Secondary | ICD-10-CM

## 2014-05-15 DIAGNOSIS — E785 Hyperlipidemia, unspecified: Secondary | ICD-10-CM

## 2014-05-15 DIAGNOSIS — H6122 Impacted cerumen, left ear: Secondary | ICD-10-CM

## 2014-05-15 DIAGNOSIS — Z23 Encounter for immunization: Secondary | ICD-10-CM

## 2014-05-15 DIAGNOSIS — I10 Essential (primary) hypertension: Secondary | ICD-10-CM

## 2014-05-15 DIAGNOSIS — H6120 Impacted cerumen, unspecified ear: Secondary | ICD-10-CM

## 2014-05-15 DIAGNOSIS — Z780 Asymptomatic menopausal state: Secondary | ICD-10-CM

## 2014-05-15 LAB — MICROALBUMIN / CREATININE URINE RATIO
Creatinine,U: 187.6 mg/dL
MICROALB/CREAT RATIO: 2.1 mg/g (ref 0.0–30.0)
Microalb, Ur: 4 mg/dL — ABNORMAL HIGH (ref 0.0–1.9)

## 2014-05-15 LAB — CBC WITH DIFFERENTIAL/PLATELET
BASOS ABS: 0 10*3/uL (ref 0.0–0.1)
Basophils Relative: 0.6 % (ref 0.0–3.0)
Eosinophils Absolute: 0.3 10*3/uL (ref 0.0–0.7)
Eosinophils Relative: 3 % (ref 0.0–5.0)
HEMATOCRIT: 34.6 % — AB (ref 36.0–46.0)
Hemoglobin: 11.6 g/dL — ABNORMAL LOW (ref 12.0–15.0)
LYMPHS ABS: 2.8 10*3/uL (ref 0.7–4.0)
Lymphocytes Relative: 33.5 % (ref 12.0–46.0)
MCHC: 33.6 g/dL (ref 30.0–36.0)
MCV: 95.3 fl (ref 78.0–100.0)
MONOS PCT: 9.8 % (ref 3.0–12.0)
Monocytes Absolute: 0.8 10*3/uL (ref 0.1–1.0)
Neutro Abs: 4.5 10*3/uL (ref 1.4–7.7)
Neutrophils Relative %: 53.1 % (ref 43.0–77.0)
Platelets: 233 10*3/uL (ref 150.0–400.0)
RBC: 3.63 Mil/uL — AB (ref 3.87–5.11)
RDW: 13.3 % (ref 11.5–15.5)
WBC: 8.4 10*3/uL (ref 4.0–10.5)

## 2014-05-15 LAB — COMPREHENSIVE METABOLIC PANEL
ALT: 19 U/L (ref 0–35)
AST: 29 U/L (ref 0–37)
Albumin: 4.1 g/dL (ref 3.5–5.2)
Alkaline Phosphatase: 69 U/L (ref 39–117)
BILIRUBIN TOTAL: 0.6 mg/dL (ref 0.2–1.2)
BUN: 22 mg/dL (ref 6–23)
CALCIUM: 9.7 mg/dL (ref 8.4–10.5)
CO2: 27 mEq/L (ref 19–32)
CREATININE: 1.1 mg/dL (ref 0.4–1.2)
Chloride: 100 mEq/L (ref 96–112)
GFR: 51.4 mL/min — ABNORMAL LOW (ref >60.00)
Glucose, Bld: 91 mg/dL (ref 70–99)
Potassium: 4.3 mEq/L (ref 3.5–5.1)
Sodium: 136 mEq/L (ref 135–145)
Total Protein: 7.1 g/dL (ref 6.0–8.3)

## 2014-05-15 LAB — LIPID PANEL
CHOLESTEROL: 145 mg/dL (ref 0–200)
HDL: 37.6 mg/dL — AB (ref >39.00)
LDL Cholesterol: 86 mg/dL (ref 0–99)
NONHDL: 107.4
TRIGLYCERIDES: 107 mg/dL (ref 0.0–149.0)
Total CHOL/HDL Ratio: 4
VLDL: 21.4 mg/dL (ref 0.0–40.0)

## 2014-05-15 LAB — VITAMIN D 25 HYDROXY (VIT D DEFICIENCY, FRACTURES): VITD: 51.15 ng/mL (ref 30.00–100.00)

## 2014-05-15 NOTE — Progress Notes (Signed)
Pre visit review using our clinic review tool, if applicable. No additional management support is needed unless otherwise documented below in the visit note. 

## 2014-05-15 NOTE — Addendum Note (Signed)
Addended by: Vernetta Honey on: 05/15/2014 11:15 AM   Modules accepted: Orders

## 2014-05-15 NOTE — Assessment & Plan Note (Addendum)
General medical exam including breast exam is normal today except as noted. Pap and pelvic deferred as patient is status post hysterectomy and has history of all normal Paps in the past. Health maintenance is up to date except for mammogram and bone density which have been ordered. Immunizations are up to date, except for flu vaccine which was given today. Appropriate screening performed. Encouraged continued efforts at healthy diet and regular physical activity with walking.

## 2014-05-15 NOTE — Progress Notes (Signed)
Subjective:    Patient ID: Robin Alexander, female    DOB: 01/04/1933, 80 y.o.   MRN: [id number]  HPI The patient is here for annual Medicare wellness examination and management of other chronic and acute problems.   The risk factors are reflected in the social history.  The roster of all physicians providing medical care to patient - is listed in the Snapshot section of the chart.  Activities of daily living:  The patient is 100% independent in all ADLs: dressing, toileting, feeding as well as independent mobility. Lives with husband in free-standing home. 3 stories. Dog in home.  Home safety : The patient has smoke detectors in the home. They wear seatbelts.  There are no firearms at home. There is no violence in the home.   There is no risks for hepatitis, STDs or HIV. There is a history of blood transfusion, after birth of her son. They have no travel history to infectious disease endemic areas of the world.  The patient has seen their dentist in the last six month. Dr. Ronita Hipps They have seen their eye doctor in the last year.  Dr. Ellin Mayhew  They have deferred audiologic testing in the last year.   They do not  have excessive sun exposure. Discussed the need for sun protection: hats, long sleeves and use of sunscreen if there is significant sun exposure. Dermatologist - Dr. Kellie Moor Neurosurgeon - Dr. Hal Neer  Diet: the importance of a healthy diet is discussed. They do have a healthy diet.  The benefits of regular aerobic exercise were discussed. She walks on a regular basis.  Depression screen: there are no signs or vegative symptoms of depression- irritability, change in appetite, anhedonia, sadness/tearfullness.  Cognitive assessment: the patient manages all their financial and personal affairs and is actively engaged. They could relate day,date,year and events.  HCPOA - Charlton Amor  The following portions of the patient's history were reviewed and updated as  appropriate: allergies, current medications, past family history, past medical history,  past surgical history, past social history  and problem list.  Visual acuity was not assessed per patient preference since she has regular follow up with her ophthalmologist. Hearing and body mass index were assessed and reviewed.   During the course of the visit the patient was educated and counseled about appropriate screening and preventive services including : fall prevention , diabetes screening, nutrition counseling, colorectal cancer screening, and recommended immunizations.      Review of Systems  Constitutional: Negative for fever, chills, appetite change, fatigue and unexpected weight change.  Eyes: Negative for visual disturbance.  Respiratory: Negative for shortness of breath.   Cardiovascular: Negative for chest pain and leg swelling.  Gastrointestinal: Negative for nausea, vomiting, abdominal pain, diarrhea and constipation.  Musculoskeletal: Negative for arthralgias and myalgias.  Skin: Negative for color change and rash.  Hematological: Negative for adenopathy. Does not bruise/bleed easily.  Psychiatric/Behavioral: Negative for sleep disturbance and dysphoric mood. The patient is not nervous/anxious.        Objective:    BP 110/62  Pulse 77  Temp(Src) 98.2 F (36.8 C) (Oral)  Ht 4' 10.5" (1.486 m)  Wt 148 lb 8 oz (67.359 kg)  BMI 30.50 kg/m2  SpO2 97% Physical Exam  Constitutional: She is oriented to person, place, and time. She appears well-developed and well-nourished. No distress.  HENT:  Head: Normocephalic and atraumatic.  Right Ear: External ear normal.  Left Ear: External ear normal.  Nose: Nose normal.  Mouth/Throat: Oropharynx  is clear and moist. No oropharyngeal exudate.  Eyes: Conjunctivae are normal. Pupils are equal, round, and reactive to light. Right eye exhibits no discharge. Left eye exhibits no discharge. No scleral icterus.  Neck: Normal range of motion.  Neck supple. No tracheal deviation present. No thyromegaly present.  Cardiovascular: Normal rate, regular rhythm, normal heart sounds and intact distal pulses.  Exam reveals no gallop and no friction rub.   No murmur heard. Pulmonary/Chest: Effort normal and breath sounds normal. No accessory muscle usage. Not tachypneic. No respiratory distress. She has no decreased breath sounds. She has no wheezes. She has no rales. She exhibits no tenderness. Right breast exhibits no inverted nipple, no mass, no nipple discharge, no skin change and no tenderness. Left breast exhibits no inverted nipple, no mass, no nipple discharge, no skin change and no tenderness. Breasts are symmetrical.  Abdominal: Soft. Bowel sounds are normal. She exhibits no distension and no mass. There is no tenderness. There is no rebound and no guarding.  Musculoskeletal: Normal range of motion. She exhibits no edema and no tenderness.  Lymphadenopathy:    She has no cervical adenopathy.  Neurological: She is alert and oriented to person, place, and time. No cranial nerve deficit. She exhibits normal muscle tone. Coordination normal.  Skin: Skin is warm and dry. No rash noted. She is not diaphoretic. No erythema. No pallor.  Psychiatric: She has a normal mood and affect. Her behavior is normal. Judgment and thought content normal.          Assessment & Plan:   Problem List Items Addressed This Visit     Unprioritized   Medicare annual wellness visit, subsequent - Primary     General medical exam including breast exam is normal today except as noted. Pap and pelvic deferred as patient is status post hysterectomy and has history of all normal Paps in the past. Health maintenance is up to date except for mammogram and bone density which have been ordered. Immunizations are up to date, except for flu vaccine which was given today. Appropriate screening performed. Encouraged continued efforts at healthy diet and regular physical  activity with walking.      Relevant Orders      MM Digital Screening      CBC with Differential      Comprehensive metabolic panel      Lipid panel      Microalbumin / creatinine urine ratio      Vit D  25 hydroxy (rtn osteoporosis monitoring)    Other Visit Diagnoses   Postmenopausal estrogen deficiency        Relevant Orders       DG Bone Density        Return in about 6 months (around 11/13/2014) for Recheck.

## 2014-05-15 NOTE — Patient Instructions (Signed)

## 2014-05-29 ENCOUNTER — Other Ambulatory Visit: Payer: Self-pay | Admitting: Cardiovascular Disease

## 2014-06-15 ENCOUNTER — Encounter: Payer: Self-pay | Admitting: Cardiovascular Disease

## 2014-06-15 ENCOUNTER — Ambulatory Visit (INDEPENDENT_AMBULATORY_CARE_PROVIDER_SITE_OTHER): Payer: Medicare Other | Admitting: Cardiovascular Disease

## 2014-06-15 VITALS — BP 140/70 | HR 76 | Ht 60.0 in | Wt 151.5 lb

## 2014-06-15 DIAGNOSIS — I4510 Unspecified right bundle-branch block: Secondary | ICD-10-CM

## 2014-06-15 DIAGNOSIS — I10 Essential (primary) hypertension: Secondary | ICD-10-CM

## 2014-06-15 DIAGNOSIS — E785 Hyperlipidemia, unspecified: Secondary | ICD-10-CM

## 2014-06-15 DIAGNOSIS — I350 Nonrheumatic aortic (valve) stenosis: Secondary | ICD-10-CM

## 2014-06-15 NOTE — Patient Instructions (Signed)
Continue same medications.   Your physician wants you to follow-up in: 1 year.  You will receive a reminder letter in the mail two months in advance. If you don't receive a letter, please call our office to schedule the follow-up appointment.  Your next appointment will be scheduled in our new office located at :  [facility]  [address], [place]  [place], [place] [address]

## 2014-06-15 NOTE — Progress Notes (Signed)
HPI  This is a pleasant 80 year old female who is here today for a follow up visit regarding mild aortic stenosis.  She has known history of hypertension, hyperlipidemia and gastroesophageal reflux disease. She has right bundle branch block on her EKG.  Echo in 06/2013 showed: Normal LV systolic function, mild LVH, mild aortic stenosis and regurgitation, mild mitral regurgitation and mildly dilated left atrium .  treadmill stress test in October of 2014 showed no evidence of ischemia. She has been stable from a cardiac standpoint and denies any chest pain or worsening dyspnea.   Allergies  Allergen Reactions  . Latex     hives  . Codeine Itching     Current Outpatient Prescriptions on File Prior to Visit  Medication Sig Dispense Refill  . amLODipine-benazepril (LOTREL) 5-20 MG per capsule Take 1 capsule by mouth  daily  90 capsule  1  . aspirin EC 81 MG tablet Take 81 mg by mouth daily.        . cholecalciferol (VITAMIN D) 1000 UNITS tablet Take 1,000 Units by mouth daily.        . diphenhydrAMINE (BENADRYL) 25 MG tablet Take 25 mg by mouth daily.        . fish oil-omega-3 fatty acids 1000 MG capsule Take 1,000 mg by mouth 2 (two) times daily.       . Multiple Vitamin (MULTIVITAMIN) capsule Take 1 capsule by mouth daily.        Marland Kitchen omeprazole (PRILOSEC) 40 MG capsule Take 1 capsule by mouth  daily  90 capsule  1  . Sennosides-Docusate Sodium ([employer] STOOL SOFTENER/LAXATIVE PO) Take by mouth 3 (three) times daily.       . simvastatin (ZOCOR) 20 MG tablet Take 1 tablet by mouth at  bedtime  90 tablet  1  . vitamin C (ASCORBIC ACID) 500 MG tablet Take 500 mg by mouth daily.         No current facility-administered medications on file prior to visit.     Past Medical History  Diagnosis Date  . HTN (hypertension)   . Arthritis   . Chicken pox   . High cholesterol      Past Surgical History  Procedure Laterality Date  . Total hip arthroplasty  2005 & 2006    Bilateral,  [place]  . Appendectomy  1950  . Abdominal hysterectomy      in 50's  . Cesarean section      2     Family History  Problem Relation Age of Onset  . Arthritis Mother   . Heart disease Mother   . Arthritis Father   . Stroke Brother   . Heart disease Brother   . Cancer Neg Hx      History   Social History  . Marital Status: Married    Spouse Name: N/A    Number of Children: 2  . Years of Education: N/A   Occupational History  . Retired - Press photographer    Social History Main Topics  . Smoking status: Former Smoker -- 0.75 packs/day for 20 years    Types: Cigarettes    Quit date: 09/02/1983  . Smokeless tobacco: Never Used  . Alcohol Use: Yes     Comment: Occasional wine  . Drug Use: No  . Sexual Activity: Not on file   Other Topics Concern  . Not on file   Social History Narrative   Regular Exercise -  Active but no regular exercise routine  Daily Caffeine Use:  NO           ROS A 10 point  review of system was performed. It is negative other than what is mentioned in the history of present illness.  PHYSICAL EXAM   BP 140/70  Pulse 76  Ht 5' (1.524 m)  Wt 151 lb 8 oz (68.72 kg)  BMI 29.59 kg/m2 Constitutional: She is oriented to person, place, and time. She appears well-developed and well-nourished. No distress.  HENT: No nasal discharge.  Head: Normocephalic and atraumatic.  Eyes: Pupils are equal and round. Right eye exhibits no discharge. Left eye exhibits no discharge.  Neck: Normal range of motion. Neck supple. No JVD present. No thyromegaly present.  Cardiovascular: Normal rate, regular rhythm, normal heart sounds. Exam reveals no gallop and no friction rub. There is a 2/6 systolic crescendo decrescendo murmur at the base of the heart which is mid peaking with preserved S2. The murmur is softer with Valsalva maneuver. Pulmonary/Chest: Effort normal and breath sounds normal. No stridor. No respiratory distress. She has no wheezes. She has no  rales. She exhibits no tenderness.  Abdominal: Soft. Bowel sounds are normal. She exhibits no distension. There is no tenderness. There is no rebound and no guarding.  Musculoskeletal: Normal range of motion. She exhibits no edema and no tenderness.  Neurological: She is alert and oriented to person, place, and time. Coordination normal.  Skin: Skin is warm and dry. No rash noted. She is not diaphoretic. No erythema. No pallor.  Psychiatric: She has a normal mood and affect. Her behavior is normal. Judgment and thought content normal.     EKG: Normal sinus rhythm with a right bundle branch block.   ASSESSMENT AND PLAN

## 2014-06-16 NOTE — Assessment & Plan Note (Signed)
Continue treatment with simvastatin with a target LDL of less than 100 especially that recent CT scan showed coronary calcifications.

## 2014-06-16 NOTE — Assessment & Plan Note (Signed)
Blood pressure is controlled on current medications. 

## 2014-06-16 NOTE — Assessment & Plan Note (Signed)
She is stable from a cardiac standpoint with no symptoms suggestive of angina, heart failure or arrhythmia. Continue observation and consider repeat echocardiogram next year.

## 2014-06-23 ENCOUNTER — Ambulatory Visit (INDEPENDENT_AMBULATORY_CARE_PROVIDER_SITE_OTHER): Payer: Medicare Other | Admitting: Family Medicine

## 2014-06-23 ENCOUNTER — Ambulatory Visit (INDEPENDENT_AMBULATORY_CARE_PROVIDER_SITE_OTHER)
Admission: RE | Admit: 2014-06-23 | Discharge: 2014-06-23 | Disposition: A | Discharge status: Home / Self Care | Payer: Medicare Other | Source: Ambulatory Visit | Attending: Family Medicine | Admitting: Family Medicine

## 2014-06-23 ENCOUNTER — Encounter: Payer: Self-pay | Admitting: Family Medicine

## 2014-06-23 VITALS — BP 130/76 | HR 78 | Ht 60.0 in | Wt 152.0 lb

## 2014-06-23 DIAGNOSIS — M25562 Pain in left knee: Secondary | ICD-10-CM

## 2014-06-23 DIAGNOSIS — M2550 Pain in unspecified joint: Secondary | ICD-10-CM

## 2014-06-23 DIAGNOSIS — M7062 Trochanteric bursitis, left hip: Secondary | ICD-10-CM

## 2014-06-23 DIAGNOSIS — M5416 Radiculopathy, lumbar region: Secondary | ICD-10-CM

## 2014-06-23 DIAGNOSIS — M25561 Pain in right knee: Secondary | ICD-10-CM

## 2014-06-23 DIAGNOSIS — M7061 Trochanteric bursitis, right hip: Secondary | ICD-10-CM

## 2014-06-23 MED ORDER — DICLOFENAC SODIUM 2 % TD SOLN: TRANSDERMAL | Status: DC

## 2014-06-23 NOTE — Assessment & Plan Note (Addendum)
Patient's arthralgias is likely secondary to osteoarthritic findings. Patient does have a history of having bilateral hip replacements. This meets patient does have osteophytic findings at baseline. Reviewing patient's previous MRI of her lumbar spine in 2003 patient also has severe osteophytic changes. That was try to treat patient's bilateral leg pain as lumbar radiculopathy. Likely this is secondary to spinal stenosis. We requested the new MRI. Patient at this point would like to try conservative therapy and will try over-the-counter natural supplementations, home exercises, icing protocol. We'll get x-rays of patient's knees to further evaluate. Patient is going to try these interventions and come back and see me again in 3 weeks for further evaluation.

## 2014-06-23 NOTE — Assessment & Plan Note (Signed)
Trace ordered today. Home exercises given. We discussed the possibility of a brace which patient declined. Patient continues to have discomfort we will consider injections. Topical anti-inflammatories were prescribed. She'll followup in 3 weeks.

## 2014-06-23 NOTE — Patient Instructions (Signed)
Good to meet you.  Ice 20 minutes 2 times daily. Usually after activity and before bed. Exercises 3 times a week. Alternate back and knee exercises.  Try the pennsaid twice daily as needed and call the insurance.  Heel lift in left shoe always.  Xrays downstairs today.  Take tylenol 650 mg three times a day is the best evidence based medicine we have for arthritis.  Glucosamine sulfate 750mg  twice a day is a supplement that has been shown to help moderate to severe arthritis. Vitamin D 2000 IU daily Fish oil 2 grams daily.  Tumeric 500mg  twice daily.  Capsaicin topically up to four times a day may also help with pain. Cortisone injections are an option if these interventions do not seem to make a difference or need more relief.  If cortisone injections do not help, there are different types of shots that may help but they take longer to take effect.  We can discuss this at follow up.  It's important that you continue to stay active. Controlling your weight is important.  Consider physical therapy to strengthen muscles around the joint that hurts to take pressure off of the joint itself. Shoe inserts with good arch support may be helpful.  [employer] orthotics at [employer] could help.  Water aerobics and cycling with low resistance are the best two types of exercise for arthritis. Come back and see me in 2-3 weeks.

## 2014-06-23 NOTE — Assessment & Plan Note (Signed)
Likely secondary to weakness due to decreasing ambulation. Discussed icing protocol discussed topical anti-inflammatories. Patient continues to have pain in 3 weeks we'll do a corticosteroid injection.

## 2014-06-23 NOTE — Progress Notes (Signed)
[name] [address], [place] [address] Phone: [phone] Subjective:    I'm seeing this patient by the request  of:  Rica Mast, MD   CC: Hips and knee pain.   UDJ:[id number] Robin Alexander is a 80 y.o. female coming in with complaint of hip and knee pain. Patient does have bilateral hip arthroplasties back in 2005 and 2006. Patient states she continued to have actually radiation down both of her legs. Patient states it happens on the outside of her hips bilaterally right greater than left and goes all the way down to the anterior aspect of her knees. Patient states that she does have some mild back pain and has been treated with epidural steroid injections with the last one being less than one month ago without any significant improvement and actually made the legs burned more frequently. Patient denies any significant numbness but states that from time to time her legs feel heavy. Patient is very active and finds it difficult to do certain activities such as a lot of walking because his pain becomes so severe that she has to stop certain activities. Patient states that the pain patient tries to attempt not to take any over-the-counter medications whenever possible. States that icing is somewhat helpful. Rates the severity of 8/10.     Past medical history, social, surgical and family history all reviewed in electronic medical record.   Review of Systems: No headache, visual changes, nausea, vomiting, diarrhea, constipation, dizziness, abdominal pain, skin rash, fevers, chills, night sweats, weight loss, swollen lymph nodes, body aches, joint swelling, muscle aches, chest pain, shortness of breath, mood changes.   Objective Blood pressure 130/76, pulse 78, height 5' (1.524 m), weight 152 lb (68.947 kg), SpO2 99.00%.  General: No apparent distress alert and oriented x3 mood and affect normal, dressed appropriately.  HEENT: Pupils equal,  extraocular movements intact  Respiratory: Patient's speak in full sentences and does not appear short of breath  Cardiovascular: No lower extremity edema, non tender, no erythema  Skin: Warm dry intact with no signs of infection or rash on extremities or on axial skeleton.  Abdomen: Soft nontender  Neuro: Cranial nerves II through XII are intact, neurovascularly intact in all extremities with 2+ DTRs and 2+ pulses.  Lymph: No lymphadenopathy of posterior or anterior cervical chain or axillae bilaterally.  Gait normal with good balance and coordination.  MSK:  Non tender with full range of motion and good stability and symmetric strength and tone of shoulders, elbows, wrist bilaterally. Hip: Bilateral hip replacements with good range of motion. Neurovascularly intact distally. Patient does have some mild greater trochanteric bursitis tenderness bilaterally right greater than left. Knee: Bilaterally Normal to inspection with no erythema or effusion or obvious bony abnormalities. Severe tenderness to palpation of the medial joint lines bilaterally.Marland Kitchen ROM full in flexion and extension and lower leg rotation. Ligaments with solid consistent endpoints including ACL, PCL, LCL, MCL. Negative Mcmurray's, Apley's, and Thessalonian tests. Mild painful patellar compression. Patellar glide without crepitus. Patellar and quadriceps tendons unremarkable. Hamstring and quadriceps strength is normal.  Back exam shows the patient does have decreased flexion and extension by approximately 10 in each plane and limited range of motion with rotation. Patient is minimally tender to palpation in the paraspinal musculature bilaterally but negative straight leg test bilaterally. Deep tendon reflexes are 2+ and symmetric.  .   Impression and Recommendations:     This case required medical decision making of moderate  complexity.

## 2014-06-29 ENCOUNTER — Ambulatory Visit: Payer: Self-pay | Admitting: Internal Medicine

## 2014-06-29 LAB — HM DEXA SCAN: HM DEXA SCAN: NORMAL

## 2014-06-29 LAB — HM MAMMOGRAPHY: HM MAMMO: NEGATIVE

## 2014-06-30 ENCOUNTER — Telehealth: Payer: Self-pay | Admitting: Internal Medicine

## 2014-06-30 ENCOUNTER — Encounter: Payer: Self-pay | Admitting: *Deleted

## 2014-06-30 NOTE — Telephone Encounter (Signed)
Notified pt. 

## 2014-06-30 NOTE — Telephone Encounter (Signed)
Bone density testing was normal with T-score -0.9.

## 2014-07-03 ENCOUNTER — Other Ambulatory Visit: Payer: Self-pay | Admitting: Internal Medicine

## 2014-07-03 MED ORDER — SIMVASTATIN 20 MG PO TABS: ORAL_TABLET | ORAL | Status: DC

## 2014-07-03 NOTE — Telephone Encounter (Signed)
simvastatin (ZOCOR) 20 MG tablet  

## 2014-07-03 NOTE — Telephone Encounter (Signed)
Rx sent to pharmacy by escript  

## 2014-07-05 ENCOUNTER — Telehealth: Payer: Self-pay | Admitting: Family Medicine

## 2014-07-05 NOTE — Telephone Encounter (Signed)
Received records, 5 pages, from [facility], sent to Dr. Tamala Julian. 07/05/14/ss

## 2014-07-06 ENCOUNTER — Encounter: Payer: Self-pay | Admitting: Family Medicine

## 2014-07-06 ENCOUNTER — Ambulatory Visit (INDEPENDENT_AMBULATORY_CARE_PROVIDER_SITE_OTHER): Payer: Medicare Other | Admitting: Family Medicine

## 2014-07-06 VITALS — BP 142/64 | HR 69 | Ht 60.0 in | Wt 150.0 lb

## 2014-07-06 DIAGNOSIS — M25561 Pain in right knee: Secondary | ICD-10-CM

## 2014-07-06 DIAGNOSIS — M5416 Radiculopathy, lumbar region: Secondary | ICD-10-CM

## 2014-07-06 DIAGNOSIS — M7062 Trochanteric bursitis, left hip: Secondary | ICD-10-CM

## 2014-07-06 DIAGNOSIS — M25562 Pain in left knee: Secondary | ICD-10-CM

## 2014-07-06 DIAGNOSIS — M7061 Trochanteric bursitis, right hip: Secondary | ICD-10-CM

## 2014-07-06 MED ORDER — ALLOPURINOL 100 MG PO TABS: 100.0000 mg | ORAL_TABLET | Freq: Every day | ORAL | Status: DC

## 2014-07-06 NOTE — Assessment & Plan Note (Signed)
No significant change the patient is not stating any significant worsening. Patient is now sleeping on her back at night which is an improvement. Encourage the monitor. We can always consider injections if necessary.

## 2014-07-06 NOTE — Assessment & Plan Note (Signed)
Patient bilateral seems to moderate arthritis as well as CPPD Started on low-dose allopurinol and likely will not titrate up. Patient encouraged to continue the home exercises as well as the icing protocol. Patient will increase her activity as tolerated. I will of the patient come back in 4-6 weeks for further evaluation and treatment.

## 2014-07-06 NOTE — Patient Instructions (Signed)
Good to see you You are doing great overall New exercises 3 times a week Ice is still your friend Continue the other vitamins Start allopurinol 1 time daily to help with the CPPD.  See me again in 4-6 weeks.

## 2014-07-06 NOTE — Progress Notes (Signed)
  [name] [address] [place], [place] [address] Phone: [phone] Subjective:    ICC: Hips and knee pain.   [id number] Robin Alexander is a 80 y.o. female coming in with complaint of hip and knee pain. Patient does have bilateral hip arthroplasties back in 2005 and 2006.  Patient was seen previously and did have osteoarthritic changes. Patient did have x-rays of the knees that showed moderate osteophytic changes but also CPPD of the knee. Patient was started on a home exercise program, icing protocol, and over-the-counter medications. Patient states that she is feeling approximately 50% better. Patient has been able to increase her activity and has noticed less pain overall. Still when she does significant amount walking at one time she can have some discomfort in the knees medial mostly.patient states though that now she is resting more comfortably at night which is making her very happy.     Past medical history, social, surgical and family history all reviewed in electronic medical record.   Review of Systems: No headache, visual changes, nausea, vomiting, diarrhea, constipation, dizziness, abdominal pain, skin rash, fevers, chills, night sweats, weight loss, swollen lymph nodes, body aches, joint swelling, muscle aches, chest pain, shortness of breath, mood changes.   Objective Blood pressure 142/64, pulse 69, height 5' (1.524 m), weight 150 lb (68.04 kg), SpO2 97 %.  General: No apparent distress alert and oriented x3 mood and affect normal, dressed appropriately.  HEENT: Pupils equal, extraocular movements intact  Respiratory: Patient's speak in full sentences and does not appear short of breath  Cardiovascular: No lower extremity edema, non tender, no erythema  Skin: Warm dry intact with no signs of infection or rash on extremities or on axial skeleton.  Abdomen: Soft nontender  Neuro: Cranial nerves II through XII are intact, neurovascularly  intact in all extremities with 2+ DTRs and 2+ pulses.  Lymph: No lymphadenopathy of posterior or anterior cervical chain or axillae bilaterally.  Gait normal with good balance and coordination.  MSK:  Non tender with full range of motion and good stability and symmetric strength and tone of shoulders, elbows, wrist bilaterally. Hip: Bilateral hip replacements with good range of motion. Neurovascularly intact distally. Patient does have some mild greater trochanteric bursitis tenderness bilaterally right greater than left.mild tenderness now over the distal iliotibial band as well as. Knee: Bilaterally Normal to inspection with no erythema or effusion or obvious bony abnormalities. Moderate tenderness still over the medial joint lines bilaterally ROM full in flexion and extension and lower leg rotation. Ligaments with solid consistent endpoints including ACL, PCL, LCL, MCL. Negative Mcmurray's, Apley's, and Thessalonian tests. Mild painful patellar compression. Patellar glide without crepitus. Patellar and quadriceps tendons unremarkable. Hamstring and quadriceps strength is normal.  Back exam shows the patient does have decreased flexion and extension by approximately 10 in each plane and limited range of motion with rotation. Patient is minimally tender to palpation in the paraspinal musculature bilaterally but negative straight leg test bilaterally. Deep tendon reflexes are 2+ and symmetric. No significant change .   Impression and Recommendations:     This case required medical decision making of moderate complexity.

## 2014-07-06 NOTE — Assessment & Plan Note (Signed)
Patient is using the topical anti-inflammatory and has noticed improvement. Patient does not have any side effects. Patient was given new exercises today for more strengthening exercises. Patient is doing better overall and I think she will continue to. Patient and will follow-up with me again in 4-6 weeks

## 2014-07-07 ENCOUNTER — Telehealth: Payer: Self-pay | Admitting: Family Medicine

## 2014-07-07 ENCOUNTER — Ambulatory Visit: Payer: Medicare Other | Admitting: Family Medicine

## 2014-07-07 NOTE — Telephone Encounter (Signed)
Spoke with Robin Alexander, per dr Tamala Julian i advised her to try the allopurinol through the weekend &  If still having problems, give [place] a call on Monday. Robin Alexander understood.

## 2014-07-07 NOTE — Telephone Encounter (Signed)
Patient states Dr. Tamala Julian gave her a new med to take.  She could not sleep last night and is requesting a call back in regards to if she should continue with the med.

## 2014-07-14 ENCOUNTER — Encounter: Payer: Self-pay | Admitting: Internal Medicine

## 2014-07-22 ENCOUNTER — Other Ambulatory Visit: Payer: Self-pay | Admitting: Internal Medicine

## 2014-07-24 NOTE — Telephone Encounter (Signed)
Spoke with pt, she states she is taking this medication.  She asks if it needs to be managed by Cardiology or if you will manage this med?

## 2014-07-24 NOTE — Telephone Encounter (Signed)
Medication not listed on pts current med list.  Advise refill

## 2014-07-24 NOTE — Telephone Encounter (Signed)
Can you please confirm with pt that she is taking this medication? Was it started by cardiology?

## 2014-08-03 ENCOUNTER — Ambulatory Visit (INDEPENDENT_AMBULATORY_CARE_PROVIDER_SITE_OTHER): Payer: Medicare Other | Admitting: Family Medicine

## 2014-08-03 ENCOUNTER — Encounter: Payer: Self-pay | Admitting: Family Medicine

## 2014-08-03 VITALS — BP 132/62 | HR 75 | Ht 60.0 in | Wt 152.0 lb

## 2014-08-03 DIAGNOSIS — M25561 Pain in right knee: Secondary | ICD-10-CM

## 2014-08-03 DIAGNOSIS — M25562 Pain in left knee: Secondary | ICD-10-CM

## 2014-08-03 MED ORDER — ALLOPURINOL 100 MG PO TABS: 200.0000 mg | ORAL_TABLET | Freq: Every day | ORAL | Status: DC

## 2014-08-03 NOTE — Assessment & Plan Note (Signed)
Moderate arthritis with CPPD. Patient is responding to allopurinol. I do think 200 mg will continue to be safe and effective. Patient will increase. New prescription given. Patient also given an knee brace today. Patient will try to continue the exercises as well as the icing protocol. Patient otherwise is doing significantly well. Patient will follow-up with me again in 4-6 weeks for further evaluation and treatment.  Spent greater than 25 minutes with patient face-to-face and had greater than 50% of counseling including as described above in assessment and plan.

## 2014-08-03 NOTE — Progress Notes (Signed)
  [name] [address] [place], [place] [address] Phone: [phone] Subjective:    ICC: Hips and knee pain.   [id number] Robin Alexander is a 80 y.o. female coming in with complaint of hip and knee pain. Patient does have bilateral hip arthroplasties back in 2005 and 2006.  Patient was seen previously and did have osteoarthritic changes. Patient did have x-rays of the knees that showed moderate osteophytic changes but also CPPD of the knee. Patient was started on a home exercise program, icing protocol, and over-the-counter medications. Patient continues to improve overall. Still having mild aching knee pain being the worse problem. Patient states certain days can be horrible but most a seem to be better than they were. Patient's states she can usually do all her daily activities to walk fairly well. Patient states though that with walking her legs do seem to get somewhat fatigued. Patient has been though having more good days than bad days. Denies any nighttime awakening..     Past medical history, social, surgical and family history all reviewed in electronic medical record.   Review of Systems: No headache, visual changes, nausea, vomiting, diarrhea, constipation, dizziness, abdominal pain, skin rash, fevers, chills, night sweats, weight loss, swollen lymph nodes, body aches, joint swelling, muscle aches, chest pain, shortness of breath, mood changes.   Objective Blood pressure 132/62, pulse 75, height 5' (1.524 m), weight 152 lb (68.947 kg), SpO2 97 %.  General: No apparent distress alert and oriented x3 mood and affect normal, dressed appropriately.  HEENT: Pupils equal, extraocular movements intact  Respiratory: Patient's speak in full sentences and does not appear short of breath  Cardiovascular: No lower extremity edema, non tender, no erythema  Skin: Warm dry intact with no signs of infection or rash on extremities or on axial skeleton.    Abdomen: Soft nontender  Neuro: Cranial nerves II through XII are intact, neurovascularly intact in all extremities with 2+ DTRs and 2+ pulses.  Lymph: No lymphadenopathy of posterior or anterior cervical chain or axillae bilaterally.  Gait normal with good balance and coordination.  MSK:  Non tender with full range of motion and good stability and symmetric strength and tone of shoulders, elbows, wrist bilaterally. Hip: Bilateral hip replacements with good range of motion. Neurovascularly intact distally. Patient does have some mild greater trochanteric bursitis tenderness bilaterally right greater than left.mild tenderness now over the distal iliotibial band as well as. Knee: Bilaterally Normal to inspection with no erythema or effusion or obvious bony abnormalities. Moderate tenderness still over the medial joint lines bilaterally ROM full in flexion and extension and lower leg rotation. Ligaments with solid consistent endpoints including ACL, PCL, LCL, MCL. Negative Mcmurray's, Apley's, and Thessalonian tests. Mild painful patellar compression. Patellar glide without crepitus. Patellar and quadriceps tendons unremarkable. Hamstring and quadriceps strength is normal. No significant change from previous exam  .   Impression and Recommendations:     This case required medical decision making of moderate complexity.

## 2014-08-03 NOTE — Patient Instructions (Signed)
Good to see you Ice is still your friend Try the knee brace and see if it helps Increase allopurinol 200mg  daily.  Continue the vitamins See me again in 4-6 weeks.

## 2014-08-31 ENCOUNTER — Encounter: Payer: Self-pay | Admitting: Family Medicine

## 2014-08-31 ENCOUNTER — Ambulatory Visit (INDEPENDENT_AMBULATORY_CARE_PROVIDER_SITE_OTHER): Payer: Medicare Other | Admitting: Family Medicine

## 2014-08-31 VITALS — BP 136/76 | HR 72 | Temp 98.2°F | Ht 60.0 in | Wt 152.5 lb

## 2014-08-31 DIAGNOSIS — M25562 Pain in left knee: Secondary | ICD-10-CM

## 2014-08-31 DIAGNOSIS — M25561 Pain in right knee: Secondary | ICD-10-CM

## 2014-08-31 NOTE — Progress Notes (Signed)
Pre visit review using our clinic review tool, if applicable. No additional management support is needed unless otherwise documented below in the visit note. 

## 2014-08-31 NOTE — Assessment & Plan Note (Signed)
Patient is doing remarkably well with the nutritional supplementations. We discussed continuing the allopurinol to help with the CPPD. Patient will not titrate the medication up. Patient will actually try to titrate down as long as she continues to do well. We discussed the patient has any more fatiguing or more back pain that we may need to look further into her spinal stenosis. Patient is going to return to the gym and hopefully she will not have any exacerbation. Otherwise patient will see me again in 8-12 weeks for regular intervals to make sure continuing to improve.  Spent greater than 25 minutes with patient face-to-face and had greater than 50% of counseling including as described above in assessment and plan.

## 2014-08-31 NOTE — Patient Instructions (Addendum)
Good to see you as always.  Ice is your friend Keep doing what you are doing.  You guys are some of my favorites.  Continue the vitamins and the allopurinol See me again when you need me! Happy New Year!

## 2014-08-31 NOTE — Progress Notes (Signed)
[name] [place], [place] [address] Phone: [phone] Subjective:    CC: Hips and knee pain.   [id number] Robin Alexander is a 80 y.o. female coming in with complaint of hip and knee pain. Patient does have bilateral hip arthroplasties back in 2005 and 2006.  Patient was seen previously and did have osteoarthritic changes. Patient did have x-rays of the knees that showed moderate osteophytic changes but also CPPD of the knee. Patient was started on a home exercise program, icing protocol, and over-the-counter medications. Patient continues to improve overall.  Patient at last exam had made some mild improvement. Still is having fatigue with walking. Patient does have history of spinal stenosis on the previous MRI of the lumbar spine. Patient states now she is doing better. Patient is encouraged by the way she is feeling and is going to start increasing her activity after the first of the year. Patient is very happy with the results. Patient is able to even walk at a mall without having to stop. Patient has not been unable to do this for years. Patient denies any new symptoms. Patient states that she is resting more comfortably. No side effects from the medications. Happy with the dose of the allopurinol.     Past medical history, social, surgical and family history all reviewed in electronic medical record.   Review of Systems: No headache, visual changes, nausea, vomiting, diarrhea, constipation, dizziness, abdominal pain, skin rash, fevers, chills, night sweats, weight loss, swollen lymph nodes, body aches, joint swelling, muscle aches, chest pain, shortness of breath, mood changes.   Objective Blood pressure 136/76, pulse 72, temperature 98.2 F (36.8 C), temperature source Oral, height 5' (1.524 m), weight 152 lb 8 oz (69.174 kg), SpO2 95 %.  General: No apparent distress alert and oriented x3 mood and affect normal, dressed appropriately.   HEENT: Pupils equal, extraocular movements intact  Respiratory: Patient's speak in full sentences and does not appear short of breath  Cardiovascular: No lower extremity edema, non tender, no erythema  Skin: Warm dry intact with no signs of infection or rash on extremities or on axial skeleton.  Abdomen: Soft nontender  Neuro: Cranial nerves II through XII are intact, neurovascularly intact in all extremities with 2+ DTRs and 2+ pulses.  Lymph: No lymphadenopathy of posterior or anterior cervical chain or axillae bilaterally.  Gait normal with good balance and coordination.  MSK:  Non tender with full range of motion and good stability and symmetric strength and tone of shoulders, elbows, wrist bilaterally. Hip: Bilateral hip replacements with good range of motion. Neurovascularly intact distally. Patient does have some mild greater trochanteric bursitis tenderness bilaterally right greater than left.mild tenderness now over the distal iliotibial band as well as. Back exam shows the patient does not have positive straight leg test. Full strength of the legs. Neurovascular intact distally. Patient does have limited range of motion of at least 10 in all planes. Knee: Bilaterally Normal to inspection with no erythema or effusion or obvious bony abnormalities. Moderate tenderness still over the medial joint lines bilaterally ROM full in flexion and extension and lower leg rotation. Ligaments with solid consistent endpoints including ACL, PCL, LCL, MCL. Negative Mcmurray's, Apley's, and Thessalonian tests. Mild painful patellar compression. Patellar glide without crepitus. Patellar and quadriceps tendons unremarkable. Hamstring and quadriceps strength is normal. No significant change from previous exam  .   Impression and Recommendations:     This case required medical decision  making of moderate complexity.

## 2014-09-01 DIAGNOSIS — T8859XA Other complications of anesthesia, initial encounter: Secondary | ICD-10-CM

## 2014-09-01 HISTORY — DX: Other complications of anesthesia, initial encounter: T88.59XA

## 2014-09-25 ENCOUNTER — Encounter: Payer: Self-pay | Admitting: Family Medicine

## 2014-09-25 ENCOUNTER — Other Ambulatory Visit (INDEPENDENT_AMBULATORY_CARE_PROVIDER_SITE_OTHER): Payer: Medicare Other

## 2014-09-25 ENCOUNTER — Ambulatory Visit (INDEPENDENT_AMBULATORY_CARE_PROVIDER_SITE_OTHER): Payer: Medicare Other | Admitting: Family Medicine

## 2014-09-25 VITALS — BP 118/70 | HR 78 | Ht 60.0 in | Wt 153.0 lb

## 2014-09-25 DIAGNOSIS — M25551 Pain in right hip: Secondary | ICD-10-CM

## 2014-09-25 DIAGNOSIS — M25552 Pain in left hip: Secondary | ICD-10-CM

## 2014-09-25 DIAGNOSIS — M7062 Trochanteric bursitis, left hip: Secondary | ICD-10-CM | POA: Diagnosis not present

## 2014-09-25 DIAGNOSIS — M5416 Radiculopathy, lumbar region: Secondary | ICD-10-CM

## 2014-09-25 DIAGNOSIS — M7061 Trochanteric bursitis, right hip: Secondary | ICD-10-CM

## 2014-09-25 MED ORDER — GABAPENTIN 100 MG PO CAPS: 100.0000 mg | ORAL_CAPSULE | Freq: Every day | ORAL | Status: DC

## 2014-09-25 NOTE — Progress Notes (Signed)
[name] [place], [place] [address] Phone: [phone] Subjective:    CC: Hips and knee pain follow-up.   [id number] Robin Alexander is a 81 y.o. female coming in with complaint of hip and knee pain. Patient does have bilateral hip arthroplasties back in 2005 and 2006.  Patient was seen previously and did have osteoarthritic changes. Patient did have x-rays of the knees that showed moderate osteophytic changes but also CPPD of the knee. Patient was started on a home exercise program, icing protocol, and over-the-counter medications. Patient continues to improve overall. She states that her knee seems to be doing significantly better. Patient at last exam had made some mild improvement. Still is having fatigue with walking. Patient does have history of spinal stenosis on the previous MRI of the lumbar spine. Patient feels that her back is doing well.  Patient is complaining mostly of lateral hip pain again. Patient states it hurts more when she actually is laying on the sides. Patient states that it is more tender to palpation as well when she touches him. Worse when she is getting up from sitting. Patient denies any groin pain. Denies any numbness or tingling. Denies any radiation the pain. Patient states that this feels somewhat different than her back pain.     Past medical history, social, surgical and family history all reviewed in electronic medical record.   Review of Systems: No headache, visual changes, nausea, vomiting, diarrhea, constipation, dizziness, abdominal pain, skin rash, fevers, chills, night sweats, weight loss, swollen lymph nodes, body aches, joint swelling, muscle aches, chest pain, shortness of breath, mood changes.   Objective Blood pressure 118/70, pulse 78, height 5' (1.524 m), weight 153 lb (69.4 kg), SpO2 97 %.  General: No apparent distress alert and oriented x3 mood and affect normal, dressed appropriately.    HEENT: Pupils equal, extraocular movements intact  Respiratory: Patient's speak in full sentences and does not appear short of breath  Cardiovascular: No lower extremity edema, non tender, no erythema  Skin: Warm dry intact with no signs of infection or rash on extremities or on axial skeleton.  Abdomen: Soft nontender  Neuro: Cranial nerves II through XII are intact, neurovascularly intact in all extremities with 2+ DTRs and 2+ pulses.  Lymph: No lymphadenopathy of posterior or anterior cervical chain or axillae bilaterally.  Gait normal with good balance and coordination.  MSK:  Non tender with full range of motion and good stability and symmetric strength and tone of shoulders, elbows, wrist bilaterally. Hip: Bilateral hip replacements with good range of motion. Neurovascularly intact distally. Patient does have some mild greater trochanteric bursitis tenderness bilaterally right greater than lef Back exam shows the patient does not have positive straight leg test. Full strength of the legs. Neurovascular intact distally. Patient does have limited range of motion of at least 10 in all planes. Knee: Bilaterally Normal to inspection with no erythema or effusion or obvious bony abnormalities. Moderate tenderness still over the medial joint lines bilaterally ROM full in flexion and extension and lower leg rotation. Ligaments with solid consistent endpoints including ACL, PCL, LCL, MCL. Negative Mcmurray's, Apley's, and Thessalonian tests. Mild painful patellar compression. Patellar glide without crepitus. Patellar and quadriceps tendons unremarkable. Hamstring and quadriceps strength is normal. No significant change from previous exam  MSK US performed of: Bilaterally This study was ordered, performed, and interpreted by Charlann Boxer D.O.  Hip: Trochanteric bursa with significant hypoechoic changes and swelling patient also has  some chronic scar tissue formation overlying patient's carpal  plasty Femoral neck appears unremarkable without increased power doppler signal along Cortex.  IMPRESSION:  Greater trochanter bursitis bilaterally   Procedure: Real-time Ultrasound Guided Injection of right greater trochanteric bursitis secondary to patient's body habitus Device: GE Logiq E  Ultrasound guided injection is preferred based studies that show increased duration, increased effect, greater accuracy, decreased procedural pain, increased response rate, and decreased cost with ultrasound guided versus blind injection.  Verbal informed consent obtained.  Time-out conducted.  Noted no overlying erythema, induration, or other signs of local infection.  Skin prepped in a sterile fashion.  Local anesthesia: Topical Ethyl chloride.  With sterile technique and under real time ultrasound guidance:  Greater trochanteric area was visualized and patient's bursa was noted. A 22-gauge 3 inch needle was inserted and 4 cc of 0.5% Marcaine and 1 cc of Kenalog 40 mg/dL was injected. Pictures taken Completed without difficulty  Pain immediately resolved suggesting accurate placement of the medication.  Advised to call if fevers/chills, erythema, induration, drainage, or persistent bleeding.  Images permanently stored and available for review in the ultrasound unit.  Impression: Technically successful ultrasound guided injection.   Procedure: Real-time Ultrasound Guided Injection of left greater trochanteric bursitis secondary to patient's body habitus Device: GE Logiq E  Ultrasound guided injection is preferred based studies that show increased duration, increased effect, greater accuracy, decreased procedural pain, increased response rate, and decreased cost with ultrasound guided versus blind injection.  Verbal informed consent obtained.  Time-out conducted.  Noted no overlying erythema, induration, or other signs of local infection.  Skin prepped in a sterile fashion.  Local anesthesia: Topical  Ethyl chloride.  With sterile technique and under real time ultrasound guidance:  Greater trochanteric area was visualized and patient's bursa was noted. A 22-gauge 3 inch needle was inserted and 4 cc of 0.5% Marcaine and 1 cc of Kenalog 40 mg/dL was injected. Pictures taken Completed without difficulty  Pain immediately resolved suggesting accurate placement of the medication.  Advised to call if fevers/chills, erythema, induration, drainage, or persistent bleeding.  Images permanently stored and available for review in the ultrasound unit.  Impression: Technically successful ultrasound guided injection.   .   Impression and Recommendations:     This case required medical decision making of moderate complexity.

## 2014-09-25 NOTE — Assessment & Plan Note (Signed)
Patient was started on gabapentin. Patient was to avoid any surgical intervention or any other epidural steroid injection at this time. I do feel that likely some of her bilateral hip pain is secondary to the spinal stenosis. Patient though did respond fairly well to the injections today. We will see how patient does with the different changes. Patient is going out of town and then will come back to see me in quick succession.

## 2014-09-25 NOTE — Patient Instructions (Signed)
Good to see you Ice in 6 hours Keep up with the exercises Conitnue to be active.  Continue the vitamins Gabapentin at night 100mg  at night Have a great trip!  Call me if you need me.

## 2014-09-25 NOTE — Assessment & Plan Note (Signed)
Patient was given in the injections today. We did stay significantly superficial secondary to patient having hip replacements. We want to avoid any type of infectious etiology. Patient tolerated the procedure very well. I do think that likely the underlying problem is still the lumbar radiculopathy the patient wanted to try for diagnostic and therapeutic purposes. Patient did have a very good response to the injections. We will see how patient responds. If patient continues to have difficulty we may want to consider an epidural the patient has had difficulty with the epidural steroid injections previously. Patient was started on gabapentin as well for the potential for the lumbar radiculopathy.

## 2014-10-12 ENCOUNTER — Ambulatory Visit: Payer: Medicare Other | Admitting: Family Medicine

## 2014-11-07 ENCOUNTER — Other Ambulatory Visit: Payer: Self-pay | Admitting: Internal Medicine

## 2014-11-17 ENCOUNTER — Encounter: Payer: Self-pay | Admitting: Family Medicine

## 2014-11-17 ENCOUNTER — Ambulatory Visit (INDEPENDENT_AMBULATORY_CARE_PROVIDER_SITE_OTHER): Payer: Medicare Other | Admitting: Family Medicine

## 2014-11-17 ENCOUNTER — Other Ambulatory Visit (INDEPENDENT_AMBULATORY_CARE_PROVIDER_SITE_OTHER): Payer: Medicare Other

## 2014-11-17 VITALS — BP 138/74 | HR 72 | Ht 60.0 in | Wt 153.0 lb

## 2014-11-17 DIAGNOSIS — M7061 Trochanteric bursitis, right hip: Secondary | ICD-10-CM | POA: Diagnosis not present

## 2014-11-17 DIAGNOSIS — M25552 Pain in left hip: Secondary | ICD-10-CM

## 2014-11-17 DIAGNOSIS — M7062 Trochanteric bursitis, left hip: Secondary | ICD-10-CM

## 2014-11-17 NOTE — Assessment & Plan Note (Signed)
Patient was given a repeat injection today. We discussed home exercises and icing protocol. We discussed the possibility of formal physical therapy which patient declined. Patient will continue with the same medications at this time and was given a trial of topical anti-inflammatories again. We discussed the importance of hip abductor strengthening. Patient and will come back and see me again in 3-4 weeks. If continuing have pain we do need to consider more of the lumbar radiculopathy and potentially different medications that could help with a nerve modulator.

## 2014-11-17 NOTE — Progress Notes (Signed)
  [facility] [place], [place] [address] Phone: [phone] Subjective:    CC: Hips and knee pain follow-up.   [id number] Robin Alexander is a 81 y.o. female coming in with complaint of hip and knee pain. Patient does have bilateral hip arthroplasties back in 2005 and 2006.  Patient was having bilateral hip pain. Patient did have a superficial bursitis it seemed on ultrasound and patient was given injections bilaterally. Patient did respond very well on the right side and is having no pain but continues to have some discomfort on the left side. This injection 2 months ago..     Past medical history, social, surgical and family history all reviewed in electronic medical record.   Review of Systems: No headache, visual changes, nausea, vomiting, diarrhea, constipation, dizziness, abdominal pain, skin rash, fevers, chills, night sweats, weight loss, swollen lymph nodes, body aches, joint swelling, muscle aches, chest pain, shortness of breath, mood changes.   Objective Blood pressure 138/74, pulse 72, height 5' (1.524 m), weight 153 lb (69.4 kg), SpO2 96 %.  General: No apparent distress alert and oriented x3 mood and affect normal, dressed appropriately.  HEENT: Pupils equal, extraocular movements intact  Respiratory: Patient's speak in full sentences and does not appear short of breath  Cardiovascular: No lower extremity edema, non tender, no erythema  Skin: Warm dry intact with no signs of infection or rash on extremities or on axial skeleton.  Abdomen: Soft nontender  Neuro: Cranial nerves II through XII are intact, neurovascularly intact in all extremities with 2+ DTRs and 2+ pulses.  Lymph: No lymphadenopathy of posterior or anterior cervical chain or axillae bilaterally.  Gait normal with good balance and coordination.  MSK:  Non tender with full range of motion and good stability and symmetric strength and tone of shoulders, elbows,  wrist bilaterally. Hip: Bilateral hip replacements with good range of motion. Neurovascularly intact distally. Patient does have some mild greater trochanteric bursitis tenderness only a left-sided     Procedure: Real-time Ultrasound Guided Injection of left greater trochanteric bursitis secondary to patient's body habitus Device: GE Logiq E  Ultrasound guided injection is preferred based studies that show increased duration, increased effect, greater accuracy, decreased procedural pain, increased response rate, and decreased cost with ultrasound guided versus blind injection.  Verbal informed consent obtained.  Time-out conducted.  Noted no overlying erythema, induration, or other signs of local infection.  Skin prepped in a sterile fashion.  Local anesthesia: Topical Ethyl chloride.  With sterile technique and under real time ultrasound guidance:  Greater trochanteric area was visualized and patient's bursa was noted. A 22-gauge 3 inch needle was inserted and 4 cc of 0.5% Marcaine and 1 cc of Kenalog 40 mg/dL was injected. Pictures taken Completed without difficulty  Pain immediately resolved suggesting accurate placement of the medication.  Advised to call if fevers/chills, erythema, induration, drainage, or persistent bleeding.  Images permanently stored and available for review in the ultrasound unit.  Impression: Technically successful ultrasound guided injection.   .   Impression and Recommendations:     This case required medical decision making of moderate complexity.

## 2014-11-17 NOTE — Patient Instructions (Addendum)
Good to see you Ice 20 minutes 2 times daily. Usually after activity and before bed. Continue the exercises and keep pushing yourself We tried another injection today Continue the heel lift  Continue the vitamins.  If not perfect in 3 weeks we would need to consider if this is more your back.

## 2014-11-17 NOTE — Progress Notes (Signed)
Pre visit review using our clinic review tool, if applicable. No additional management support is needed unless otherwise documented below in the visit note. 

## 2014-12-06 ENCOUNTER — Other Ambulatory Visit: Payer: Self-pay | Admitting: Internal Medicine

## 2014-12-06 NOTE — Telephone Encounter (Signed)
Last visit 05/15/14 ok refill?

## 2014-12-12 ENCOUNTER — Encounter: Payer: Self-pay | Admitting: Family Medicine

## 2014-12-12 ENCOUNTER — Ambulatory Visit (INDEPENDENT_AMBULATORY_CARE_PROVIDER_SITE_OTHER): Payer: Medicare Other | Admitting: Family Medicine

## 2014-12-12 ENCOUNTER — Ambulatory Visit (INDEPENDENT_AMBULATORY_CARE_PROVIDER_SITE_OTHER)
Admission: RE | Admit: 2014-12-12 | Discharge: 2014-12-12 | Disposition: A | Discharge status: Home / Self Care | Payer: Medicare Other | Source: Ambulatory Visit | Attending: Family Medicine | Admitting: Family Medicine

## 2014-12-12 VITALS — BP 140/82 | HR 73

## 2014-12-12 DIAGNOSIS — Z96642 Presence of left artificial hip joint: Secondary | ICD-10-CM | POA: Diagnosis not present

## 2014-12-12 DIAGNOSIS — M25552 Pain in left hip: Secondary | ICD-10-CM

## 2014-12-12 DIAGNOSIS — Z471 Aftercare following joint replacement surgery: Secondary | ICD-10-CM | POA: Diagnosis not present

## 2014-12-12 DIAGNOSIS — M5416 Radiculopathy, lumbar region: Secondary | ICD-10-CM | POA: Diagnosis not present

## 2014-12-12 DIAGNOSIS — M25551 Pain in right hip: Secondary | ICD-10-CM | POA: Diagnosis not present

## 2014-12-12 DIAGNOSIS — M79652 Pain in left thigh: Secondary | ICD-10-CM | POA: Diagnosis not present

## 2014-12-12 MED ORDER — PREDNISONE 20 MG PO TABS: 40.0000 mg | ORAL_TABLET | Freq: Every day | ORAL | Status: DC

## 2014-12-12 MED ORDER — GABAPENTIN 100 MG PO CAPS: 100.0000 mg | ORAL_CAPSULE | Freq: Every day | ORAL | Status: DC

## 2014-12-12 NOTE — Assessment & Plan Note (Signed)
Unfortunately believe the patient's pain is secondary to more of a lumbar radiculopathy. I do think that she is having worsening symptoms. Patient has had a history of surgery previously. Patient did not respond well to epidural steroid injections and patient would like to avoid this. Patient was given gabapentin to take at night at a low dose. Patient will continually 100 to maybe 200 mg daily. Patient will get x-rays of her hip and femur for further evaluation make sure no stress fracture or occult fracture is contributing. Patient also given prednisone and a very low dose if this is from her back. Patient will follow-up again in 10-14 days for further evaluation and treatment.

## 2014-12-12 NOTE — Progress Notes (Signed)
  [facility] [place], [place] [address] Phone: [phone] Subjective:    CC: Hips and knee pain follow-up.   [id number] Robin Alexander is a 81 y.o. female coming in with complaint of hip and knee pain. Patient does have bilateral hip arthroplasties back in 2005 and 2006.  Patient was having bilateral hip pain. Patient was seen previously and was given shot in the greater trochanteric area. Patient states hip pain is better but more left leg pain.  Seems to be more anterior now. Hurts with walking. Patient states that unfortunately that this seems to be some weakness. Patient states can keep her up at night.  Patient is finding it difficult to ambulate secondary to the pain.    Past medical history, social, surgical and family history all reviewed in electronic medical record.   Review of Systems: No headache, visual changes, nausea, vomiting, diarrhea, constipation, dizziness, abdominal pain, skin rash, fevers, chills, night sweats, weight loss, swollen lymph nodes, body aches, joint swelling, muscle aches, chest pain, shortness of breath, mood changes.   Objective Blood pressure 140/82, pulse 73, SpO2 97 %.  General: No apparent distress alert and oriented x3 mood and affect normal, dressed appropriately.  HEENT: Pupils equal, extraocular movements intact  Respiratory: Patient's speak in full sentences and does not appear short of breath  Cardiovascular: No lower extremity edema, non tender, no erythema  Skin: Warm dry intact with no signs of infection or rash on extremities or on axial skeleton.  Abdomen: Soft nontender  Neuro: Cranial nerves II through XII are intact, neurovascularly intact in all extremities with 2+ DTRs and 2+ pulses.  Lymph: No lymphadenopathy of posterior or anterior cervical chain or axillae bilaterally.  Gait normal with good balance and coordination.  MSK:  Non tender with full range of motion and good stability  and symmetric strength and tone of shoulders, elbows, wrist bilaterally. Hip: Bilateral hip replacements with good range of motion. Neurovascularly intact distally. Patient does have some mild greater trochanteric bursitis tenderness only a left-sided  Patient does have a positive fulcrum test. Mild pain with internal rotation to hip. Positive straight leg test on the left. Tender over the spine and previously.   .   Impression and Recommendations:     This case required medical decision making of moderate complexity.

## 2014-12-12 NOTE — Patient Instructions (Signed)
Good to see you Continue icing Consider a compression sleeve I think it is your back giving you trouble.  Gabapentin 100mg  at night Increase vitamin D 4000 IU daily Prednisone daily for next 5 days After the prednisone try new medicine if needed.  See me again in 10-14 days/.

## 2014-12-19 ENCOUNTER — Other Ambulatory Visit: Payer: Self-pay | Admitting: Cardiovascular Disease

## 2014-12-19 ENCOUNTER — Telehealth: Payer: Self-pay | Admitting: *Deleted

## 2014-12-19 ENCOUNTER — Other Ambulatory Visit: Payer: Self-pay | Admitting: *Deleted

## 2014-12-19 MED ORDER — AMLODIPINE BESY-BENAZEPRIL HCL 5-20 MG PO CAPS: ORAL_CAPSULE | ORAL | Status: DC

## 2014-12-19 NOTE — Telephone Encounter (Signed)
°  1. Which medications need to be refilled? Amlodipine Besy-Benazepril HCl   2. Which pharmacy is medication to be sent to? [facility] in [place]   3. Do they need a 30 day or 90 day supply? 30   4. Would they like a call back once the medication has been sent to the pharmacy? No

## 2014-12-26 ENCOUNTER — Other Ambulatory Visit: Payer: Self-pay | Admitting: *Deleted

## 2014-12-26 ENCOUNTER — Ambulatory Visit (INDEPENDENT_AMBULATORY_CARE_PROVIDER_SITE_OTHER): Payer: Medicare Other | Admitting: Family Medicine

## 2014-12-26 ENCOUNTER — Encounter: Payer: Self-pay | Admitting: Family Medicine

## 2014-12-26 VITALS — BP 126/70 | HR 75 | Ht 60.0 in | Wt 153.0 lb

## 2014-12-26 DIAGNOSIS — M5416 Radiculopathy, lumbar region: Secondary | ICD-10-CM

## 2014-12-26 NOTE — Progress Notes (Signed)
Pre visit review using our clinic review tool, if applicable. No additional management support is needed unless otherwise documented below in the visit note. 

## 2014-12-26 NOTE — Progress Notes (Signed)
  [name] [place], [place] [address] Phone: [phone] Subjective:    CC: Hips and knee pain follow-up.   EXH:Robin Alexander is a 81 y.o. female coming in with complaint of hip and knee pain. Patient does have bilateral hip arthroplasties back in 2005 and 2006.  Patient was having bilateral hip pain. Patient was seen previously and was given shot in the greater trochanteric area. Patient was seen and unfortunately was having more of a lumbar radiculopathy. Patient was turned on gabapentin as well as prednisone. Patient has been adamant that she would like to avoid an epidural steroid injection due to worsening of symptoms after one previously. Patient states she is only made a very small improvement. Patient states that it still is going down the lateral and anterior aspects of the thigh. Seems to be been giving her some mild weakness. States that this is starting to affect her daily activities as well. Patient states that the pain is even waking her up at night and she has been sleeping sometimes in a rocking chair.    patient does have an MRI from April 2015 and did have 8 mm of anterior listhesis of L4 on L5 at that time. Patient also had circumferential disc bulge with superimposed disc extrusion in the midline with severe facet and ligamentum flavum hypertrophy. Patient has been seen by kritzer previously but would like a different opinion. Patient also does give history of a epidural that did not make improvement and if anything made some things worse. Past medical history, social, surgical and family history all reviewed in electronic medical record.   Review of Systems: No headache, visual changes, nausea, vomiting, diarrhea, constipation, dizziness, abdominal pain, skin rash, fevers, chills, night sweats, weight loss, swollen lymph nodes, body aches, joint swelling, muscle aches, chest pain, shortness of breath, mood changes.    Objective Blood pressure 126/70, pulse 75, height 5' (1.524 m), weight 153 lb (69.4 kg), SpO2 97 %.  General: No apparent distress alert and oriented x3 mood and affect normal, dressed appropriately.  HEENT: Pupils equal, extraocular movements intact  Respiratory: Patient's speak in full sentences and does not appear short of breath  Cardiovascular: No lower extremity edema, non tender, no erythema  Skin: Warm dry intact with no signs of infection or rash on extremities or on axial skeleton.  Abdomen: Soft nontender  Neuro: Cranial nerves II through XII are intact, neurovascularly intact in all extremities with 2+ DTRs and 2+ pulses.  Lymph: No lymphadenopathy of posterior or anterior cervical chain or axillae bilaterally.  Gait normal with good balance and coordination.  MSK:  Non tender with full range of motion and good stability and symmetric strength and tone of shoulders, elbows, wrist bilaterally. Hip: Bilateral hip replacements with good range of motion. Neurovascularly intact distally. Patient does have some mild greater trochanteric bursitis tenderness only a left-sided  Patient does have a positive fulcrum test. Mild pain with internal rotation to hip. Positive straight leg test on the left. Patient only has 30 of flexion and 10 of extension without any severe back pain with radicular symptoms. Tender over the spine and previously.   .   Impression and Recommendations:     This case required medical decision making of moderate complexity.

## 2014-12-26 NOTE — Patient Instructions (Signed)
Good to see you I am sorry you are still hurting Conitnue what you are doing Increase the gabapentin to 200mg  at night Dr. Lynann Bologna will be calling you.  [phone]  [place]  Call me with any questions or concerns.

## 2014-12-26 NOTE — Assessment & Plan Note (Signed)
Discussed with patient at this time. With patient's past medical history and exam I do believe that patient's pain is more of a lumbar radiculopathy. I do believe that L3, L4 as well as L5 likely contributing to patient's discomfort on the left side. Patient does have enough pathology there to warrant further evaluation by a specialist. Patient will be referred at this time. Patient has seen a back surgeon previously but would like a second opinion. We discussed with patient about the possibility of an epidural steroid injection but she wanted to wait to see what the specialist side. I discussed with her that this could be diagnostic as well as therapeutic and she is open to an idea of another epidural even though she had a poor result previously greater than a year ago. Referred to [facility].   Spent  25 minutes with patient face-to-face and had greater than 50% of counseling including as described above in assessment and plan.

## 2014-12-27 ENCOUNTER — Other Ambulatory Visit: Payer: Self-pay | Admitting: Internal Medicine

## 2015-01-05 DIAGNOSIS — M5416 Radiculopathy, lumbar region: Secondary | ICD-10-CM | POA: Diagnosis not present

## 2015-01-09 ENCOUNTER — Other Ambulatory Visit (HOSPITAL_COMMUNITY): Payer: Self-pay | Admitting: Orthopedic Surgery

## 2015-01-09 DIAGNOSIS — M545 Low back pain: Secondary | ICD-10-CM

## 2015-01-19 ENCOUNTER — Ambulatory Visit (HOSPITAL_COMMUNITY)
Admission: RE | Admit: 2015-01-19 | Discharge: 2015-01-19 | Disposition: A | Discharge status: Home / Self Care | Payer: Medicare Other | Source: Ambulatory Visit | Attending: Orthopedic Surgery | Admitting: Orthopedic Surgery

## 2015-01-19 DIAGNOSIS — M4806 Spinal stenosis, lumbar region: Secondary | ICD-10-CM | POA: Diagnosis not present

## 2015-01-19 DIAGNOSIS — M545 Low back pain: Secondary | ICD-10-CM | POA: Diagnosis not present

## 2015-01-19 DIAGNOSIS — M5136 Other intervertebral disc degeneration, lumbar region: Secondary | ICD-10-CM | POA: Diagnosis not present

## 2015-01-23 DIAGNOSIS — M5416 Radiculopathy, lumbar region: Secondary | ICD-10-CM | POA: Diagnosis not present

## 2015-01-26 DIAGNOSIS — M5416 Radiculopathy, lumbar region: Secondary | ICD-10-CM | POA: Diagnosis not present

## 2015-02-01 DIAGNOSIS — M5416 Radiculopathy, lumbar region: Secondary | ICD-10-CM | POA: Diagnosis not present

## 2015-02-16 DIAGNOSIS — M5416 Radiculopathy, lumbar region: Secondary | ICD-10-CM | POA: Diagnosis not present

## 2015-02-19 ENCOUNTER — Other Ambulatory Visit: Payer: Self-pay | Admitting: Orthopedic Surgery

## 2015-02-23 ENCOUNTER — Encounter (HOSPITAL_COMMUNITY)
Admission: RE | Admit: 2015-02-23 | Discharge: 2015-02-23 | Disposition: A | Discharge status: Home / Self Care | Payer: Medicare Other | Source: Ambulatory Visit | Attending: Orthopedic Surgery | Admitting: Orthopedic Surgery

## 2015-02-23 ENCOUNTER — Encounter (HOSPITAL_COMMUNITY): Payer: Self-pay

## 2015-02-23 ENCOUNTER — Ambulatory Visit (HOSPITAL_COMMUNITY)
Admission: RE | Admit: 2015-02-23 | Discharge: 2015-02-23 | Disposition: A | Discharge status: Home / Self Care | Payer: Medicare Other | Source: Ambulatory Visit | Attending: Orthopedic Surgery | Admitting: Orthopedic Surgery

## 2015-02-23 DIAGNOSIS — Z01812 Encounter for preprocedural laboratory examination: Secondary | ICD-10-CM | POA: Diagnosis not present

## 2015-02-23 DIAGNOSIS — K8020 Calculus of gallbladder without cholecystitis without obstruction: Secondary | ICD-10-CM | POA: Diagnosis not present

## 2015-02-23 DIAGNOSIS — I517 Cardiomegaly: Secondary | ICD-10-CM | POA: Insufficient documentation

## 2015-02-23 DIAGNOSIS — I700 Atherosclerosis of aorta: Secondary | ICD-10-CM | POA: Diagnosis not present

## 2015-02-23 DIAGNOSIS — M5410 Radiculopathy, site unspecified: Secondary | ICD-10-CM | POA: Diagnosis not present

## 2015-02-23 DIAGNOSIS — Z0183 Encounter for blood typing: Secondary | ICD-10-CM | POA: Insufficient documentation

## 2015-02-23 DIAGNOSIS — Z01818 Encounter for other preprocedural examination: Secondary | ICD-10-CM | POA: Diagnosis not present

## 2015-02-23 HISTORY — DX: Gastro-esophageal reflux disease without esophagitis: K21.9

## 2015-02-23 HISTORY — DX: Pneumonia, unspecified organism: J18.9

## 2015-02-23 HISTORY — DX: Cardiac murmur, unspecified: R01.1

## 2015-02-23 LAB — CBC WITH DIFFERENTIAL/PLATELET
Basophils Absolute: 0 10*3/uL (ref 0.0–0.1)
Basophils Relative: 0 % (ref 0–1)
Eosinophils Absolute: 0 10*3/uL (ref 0.0–0.7)
Eosinophils Relative: 0 % (ref 0–5)
HCT: 32.5 % — ABNORMAL LOW (ref 36.0–46.0)
HEMOGLOBIN: 10.7 g/dL — AB (ref 12.0–15.0)
LYMPHS ABS: 2.3 10*3/uL (ref 0.7–4.0)
LYMPHS PCT: 28 % (ref 12–46)
MCH: 29.5 pg (ref 26.0–34.0)
MCHC: 32.9 g/dL (ref 30.0–36.0)
MCV: 89.5 fL (ref 78.0–100.0)
Monocytes Absolute: 0.9 10*3/uL (ref 0.1–1.0)
Monocytes Relative: 10 % (ref 3–12)
NEUTROS PCT: 62 % (ref 43–77)
Neutro Abs: 5.2 10*3/uL (ref 1.7–7.7)
PLATELETS: 222 10*3/uL (ref 150–400)
RBC: 3.63 MIL/uL — AB (ref 3.87–5.11)
RDW: 13.5 % (ref 11.5–15.5)
WBC: 8.3 10*3/uL (ref 4.0–10.5)

## 2015-02-23 LAB — COMPREHENSIVE METABOLIC PANEL
ALK PHOS: 72 U/L (ref 38–126)
ALT: 20 U/L (ref 14–54)
ANION GAP: 9 (ref 5–15)
AST: 23 U/L (ref 15–41)
Albumin: 4.5 g/dL (ref 3.5–5.0)
BILIRUBIN TOTAL: 0.5 mg/dL (ref 0.3–1.2)
BUN: 25 mg/dL — ABNORMAL HIGH (ref 6–20)
CHLORIDE: 101 mmol/L (ref 101–111)
CO2: 28 mmol/L (ref 22–32)
Calcium: 10.1 mg/dL (ref 8.9–10.3)
Creatinine, Ser: 1.04 mg/dL — ABNORMAL HIGH (ref 0.44–1.00)
GFR calc Af Amer: 57 mL/min — ABNORMAL LOW (ref >60)
GFR, EST NON AFRICAN AMERICAN: 49 mL/min — AB (ref >60)
GLUCOSE: 114 mg/dL — AB (ref 65–99)
Potassium: 4.4 mmol/L (ref 3.5–5.1)
Sodium: 138 mmol/L (ref 135–145)
TOTAL PROTEIN: 7.2 g/dL (ref 6.5–8.1)

## 2015-02-23 LAB — URINALYSIS, ROUTINE W REFLEX MICROSCOPIC
Bilirubin Urine: NEGATIVE
Glucose, UA: NEGATIVE mg/dL
Hgb urine dipstick: NEGATIVE
Ketones, ur: NEGATIVE mg/dL
Nitrite: NEGATIVE
PROTEIN: NEGATIVE mg/dL
SPECIFIC GRAVITY, URINE: 1.009 (ref 1.005–1.030)
Urobilinogen, UA: 0.2 mg/dL (ref 0.0–1.0)
pH: 6 (ref 5.0–8.0)

## 2015-02-23 LAB — ABO/RH: ABO/RH(D): A POS

## 2015-02-23 LAB — URINE MICROSCOPIC-ADD ON

## 2015-02-23 LAB — PROTIME-INR
INR: 1 (ref 0.00–1.49)
Prothrombin Time: 13.4 seconds (ref 11.6–15.2)

## 2015-02-23 LAB — SURGICAL PCR SCREEN
MRSA, PCR: NEGATIVE
Staphylococcus aureus: NEGATIVE

## 2015-02-23 LAB — TYPE AND SCREEN
ABO/RH(D): A POS
Antibody Screen: NEGATIVE

## 2015-02-23 LAB — APTT: aPTT: 31 seconds (ref 24–37)

## 2015-02-23 NOTE — Pre-Procedure Instructions (Signed)
CORRA KAINE  02/23/2015      [facility] SERVICE - [place], [address] [place] [address] Phone: [phone] Fax: [phone]  [id number] - [place], [address] [place] [address] Phone: [phone] Fax: [place] [address] Phone: [phone] Fax: [phone]    Your procedure is scheduled on  Wednesday  02/28/15  Report to [facility] Admitting at 1000 A.M.  Call this number if you have problems the morning of surgery:  [phone]   Remember:  Do not eat food or drink liquids after midnight.  Take these medicines the morning of surgery with A SIP OF WATER   Tylenol if needed, allopurinol, gabapentin, omeprazole (stop aspirin, omega fish oil, multivitamin, ibuprofen/advil/motrin, diclofenac )   Do not wear jewelry, make-up or nail polish.  Do not wear lotions, powders, or perfumes.  You may wear deodorant.  Do not shave 48 hours prior to surgery.  Men may shave face and neck.  Do not bring valuables to the hospital.  [facility] is not responsible for any belongings or valuables.  Contacts, dentures or bridgework may not be worn into surgery.  Leave your suitcase in the car.  After surgery it may be brought to your room.  For patients admitted to the hospital, discharge time will be determined by your treatment team.  Patients discharged the day of surgery will not be allowed to drive home.   Name and phone number of your driver:   Special instructions:  [facility] - Preparing for Surgery  Before surgery, you can play an important role.  Because skin is not sterile, your skin needs to be as free of germs as possible.  You can reduce the number of germs on you skin by washing with CHG (chlorahexidine gluconate) soap before surgery.  CHG is an antiseptic cleaner which  kills germs and bonds with the skin to continue killing germs even after washing.  Please DO NOT use if you have an allergy to CHG or antibacterial soaps.  If your skin becomes reddened/irritated stop using the CHG and inform your nurse when you arrive at Short Stay.  Do not shave (including legs and underarms) for at least 48 hours prior to the first CHG shower.  You may shave your face.  Please follow these instructions carefully:   1.  Shower with CHG Soap the night before surgery and the                                morning of Surgery.  2.  If you choose to wash your hair, wash your hair first as usual with your       normal shampoo.  3.  After you shampoo, rinse your hair and body thoroughly to remove the                      Shampoo.  4.  Use CHG as you would any other liquid soap.  You can apply chg directly       to the skin and wash gently with scrungie or a clean washcloth.  5.  Apply the CHG Soap to your body ONLY FROM THE NECK DOWN.  Do not use on open wounds or open sores.  Avoid contact with your eyes,       ears, mouth and genitals (private parts).  Wash genitals (private parts)       with your normal soap.  6.  Wash thoroughly, paying special attention to the area where your surgery        will be performed.  7.  Thoroughly rinse your body with warm water from the neck down.  8.  DO NOT shower/wash with your normal soap after using and rinsing off       the CHG Soap.  9.  Pat yourself dry with a clean towel.            10.  Wear clean pajamas.            11.  Place clean sheets on your bed the night of your first shower and do not        sleep with pets.  Day of Surgery  Do not apply any lotions/deoderants the morning of surgery.  Please wear clean clothes to the hospital/surgery center.    Please read over the following fact sheets that you were given. Pain Booklet, Coughing and Deep Breathing, Blood Transfusion Information, MRSA Information and Surgical Site  Infection Prevention

## 2015-02-28 ENCOUNTER — Inpatient Hospital Stay (HOSPITAL_COMMUNITY): Payer: Medicare Other | Admitting: Anesthesiology

## 2015-02-28 ENCOUNTER — Inpatient Hospital Stay (HOSPITAL_COMMUNITY): Payer: Medicare Other

## 2015-02-28 ENCOUNTER — Encounter (HOSPITAL_COMMUNITY)
Admission: RE | Disposition: A | Discharge status: Home / Self Care | Payer: Self-pay | Source: Ambulatory Visit | Attending: Orthopedic Surgery

## 2015-02-28 ENCOUNTER — Encounter (HOSPITAL_COMMUNITY): Payer: Self-pay | Admitting: *Deleted

## 2015-02-28 ENCOUNTER — Inpatient Hospital Stay (HOSPITAL_COMMUNITY)
Admission: RE | Admit: 2015-02-28 | Discharge: 2015-03-01 | DRG: 460 | Disposition: A | Discharge status: Home / Self Care | Payer: Medicare Other | Source: Ambulatory Visit | Attending: Orthopedic Surgery | Admitting: Orthopedic Surgery

## 2015-02-28 DIAGNOSIS — I10 Essential (primary) hypertension: Secondary | ICD-10-CM | POA: Diagnosis not present

## 2015-02-28 DIAGNOSIS — K219 Gastro-esophageal reflux disease without esophagitis: Secondary | ICD-10-CM | POA: Diagnosis present

## 2015-02-28 DIAGNOSIS — M4806 Spinal stenosis, lumbar region: Principal | ICD-10-CM | POA: Diagnosis present

## 2015-02-28 DIAGNOSIS — Z7982 Long term (current) use of aspirin: Secondary | ICD-10-CM

## 2015-02-28 DIAGNOSIS — Z419 Encounter for procedure for purposes other than remedying health state, unspecified: Secondary | ICD-10-CM

## 2015-02-28 DIAGNOSIS — Z01818 Encounter for other preprocedural examination: Secondary | ICD-10-CM

## 2015-02-28 DIAGNOSIS — M5410 Radiculopathy, site unspecified: Secondary | ICD-10-CM | POA: Diagnosis present

## 2015-02-28 DIAGNOSIS — M5416 Radiculopathy, lumbar region: Secondary | ICD-10-CM | POA: Diagnosis present

## 2015-02-28 DIAGNOSIS — M4316 Spondylolisthesis, lumbar region: Secondary | ICD-10-CM | POA: Diagnosis present

## 2015-02-28 DIAGNOSIS — M79605 Pain in left leg: Secondary | ICD-10-CM | POA: Diagnosis present

## 2015-02-28 DIAGNOSIS — M1990 Unspecified osteoarthritis, unspecified site: Secondary | ICD-10-CM | POA: Diagnosis not present

## 2015-02-28 DIAGNOSIS — Z96643 Presence of artificial hip joint, bilateral: Secondary | ICD-10-CM | POA: Diagnosis not present

## 2015-02-28 DIAGNOSIS — Z981 Arthrodesis status: Secondary | ICD-10-CM | POA: Diagnosis not present

## 2015-02-28 DIAGNOSIS — Z87891 Personal history of nicotine dependence: Secondary | ICD-10-CM | POA: Diagnosis not present

## 2015-02-28 HISTORY — PX: ANTERIOR LAT LUMBAR FUSION: SHX1168

## 2015-02-28 SURGERY — ANTERIOR LATERAL LUMBAR FUSION 1 LEVEL
Anesthesia: General | Site: Spine Lumbar | Laterality: Right

## 2015-02-28 MED ORDER — ONDANSETRON HCL 4 MG/2ML IJ SOLN
INTRAMUSCULAR | Status: DC | PRN
  Administered 2015-02-28: 4 mg via INTRAVENOUS

## 2015-02-28 MED ORDER — MEPERIDINE HCL 25 MG/ML IJ SOLN: 6.2500 mg | INTRAMUSCULAR | Status: DC | PRN

## 2015-02-28 MED ORDER — CEFAZOLIN SODIUM 1-5 GM-% IV SOLN
1.0000 g | Freq: Three times a day (TID) | INTRAVENOUS | Status: AC
  Administered 2015-03-01 (×2): 1 g via INTRAVENOUS
  Filled 2015-02-28 (×2): qty 50

## 2015-02-28 MED ORDER — SODIUM CHLORIDE 0.9 % IJ SOLN: 3.0000 mL | Freq: Two times a day (BID) | INTRAMUSCULAR | Status: DC

## 2015-02-28 MED ORDER — SUCCINYLCHOLINE CHLORIDE 20 MG/ML IJ SOLN
INTRAMUSCULAR | Status: DC | PRN
  Administered 2015-02-28: 40 mg via INTRAVENOUS

## 2015-02-28 MED ORDER — PANTOPRAZOLE SODIUM 40 MG PO TBEC
80.0000 mg | DELAYED_RELEASE_TABLET | Freq: Every day | ORAL | Status: DC
  Administered 2015-03-01: 80 mg via ORAL
  Filled 2015-02-28 (×2): qty 2

## 2015-02-28 MED ORDER — SENNA 8.6 MG PO TABS
1.0000 | ORAL_TABLET | Freq: Two times a day (BID) | ORAL | Status: DC
  Administered 2015-03-01 (×2): 8.6 mg via ORAL
  Filled 2015-02-28 (×3): qty 1

## 2015-02-28 MED ORDER — GABAPENTIN 100 MG PO CAPS
100.0000 mg | ORAL_CAPSULE | Freq: Every day | ORAL | Status: DC
  Administered 2015-03-01: 100 mg via ORAL
  Filled 2015-02-28 (×2): qty 1

## 2015-02-28 MED ORDER — BUPIVACAINE-EPINEPHRINE 0.25% -1:200000 IJ SOLN
INTRAMUSCULAR | Status: DC | PRN
  Administered 2015-02-28: 4 mL

## 2015-02-28 MED ORDER — SIMVASTATIN 20 MG PO TABS
20.0000 mg | ORAL_TABLET | Freq: Every day | ORAL | Status: DC
  Filled 2015-02-28: qty 1

## 2015-02-28 MED ORDER — LACTATED RINGERS IV SOLN
INTRAVENOUS | Status: DC | PRN
  Administered 2015-02-28 (×2): via INTRAVENOUS

## 2015-02-28 MED ORDER — ALUM & MAG HYDROXIDE-SIMETH 200-200-20 MG/5ML PO SUSP: 30.0000 mL | Freq: Four times a day (QID) | ORAL | Status: DC | PRN

## 2015-02-28 MED ORDER — 0.9 % SODIUM CHLORIDE (POUR BTL) OPTIME
TOPICAL | Status: DC | PRN
  Administered 2015-02-28: 1000 mL

## 2015-02-28 MED ORDER — PROPOFOL 10 MG/ML IV BOLUS
INTRAVENOUS | Status: DC | PRN
  Administered 2015-02-28: 140 mg via INTRAVENOUS

## 2015-02-28 MED ORDER — DIAZEPAM 5 MG PO TABS
ORAL_TABLET | ORAL | Status: AC
  Administered 2015-02-28: 5 mg via ORAL
  Filled 2015-02-28: qty 1

## 2015-02-28 MED ORDER — PROMETHAZINE HCL 25 MG/ML IJ SOLN: 6.2500 mg | INTRAMUSCULAR | Status: DC | PRN

## 2015-02-28 MED ORDER — BISACODYL 5 MG PO TBEC: 5.0000 mg | DELAYED_RELEASE_TABLET | Freq: Every day | ORAL | Status: DC | PRN

## 2015-02-28 MED ORDER — VITAMIN C 500 MG PO TABS
500.0000 mg | ORAL_TABLET | Freq: Every day | ORAL | Status: DC
  Administered 2015-03-01: 500 mg via ORAL
  Filled 2015-02-28: qty 1

## 2015-02-28 MED ORDER — POVIDONE-IODINE 7.5 % EX SOLN
Freq: Once | CUTANEOUS | Status: DC
  Filled 2015-02-28: qty 118

## 2015-02-28 MED ORDER — MENTHOL 3 MG MT LOZG: 1.0000 | LOZENGE | OROMUCOSAL | Status: DC | PRN

## 2015-02-28 MED ORDER — FLEET ENEMA 7-19 GM/118ML RE ENEM: 1.0000 | ENEMA | Freq: Once | RECTAL | Status: AC | PRN

## 2015-02-28 MED ORDER — BENAZEPRIL HCL 20 MG PO TABS
20.0000 mg | ORAL_TABLET | Freq: Every day | ORAL | Status: DC
  Administered 2015-03-01: 20 mg via ORAL
  Filled 2015-02-28: qty 1

## 2015-02-28 MED ORDER — SODIUM CHLORIDE 0.9 % IV SOLN: 250.0000 mL | INTRAVENOUS | Status: DC

## 2015-02-28 MED ORDER — FENTANYL CITRATE (PF) 250 MCG/5ML IJ SOLN
INTRAMUSCULAR | Status: AC
  Filled 2015-02-28: qty 5

## 2015-02-28 MED ORDER — BUPIVACAINE-EPINEPHRINE (PF) 0.25% -1:200000 IJ SOLN
INTRAMUSCULAR | Status: AC
  Filled 2015-02-28: qty 30

## 2015-02-28 MED ORDER — VITAMIN D3 25 MCG (1000 UNIT) PO TABS
1000.0000 [IU] | ORAL_TABLET | Freq: Every day | ORAL | Status: DC
  Administered 2015-03-01: 1000 [IU] via ORAL
  Filled 2015-02-28: qty 1

## 2015-02-28 MED ORDER — AMLODIPINE BESY-BENAZEPRIL HCL 5-20 MG PO CAPS: 1.0000 | ORAL_CAPSULE | Freq: Every day | ORAL | Status: DC

## 2015-02-28 MED ORDER — OXYCODONE-ACETAMINOPHEN 5-325 MG PO TABS
ORAL_TABLET | ORAL | Status: AC
  Administered 2015-02-28: 2 via ORAL
  Filled 2015-02-28: qty 2

## 2015-02-28 MED ORDER — SODIUM CHLORIDE 0.9 % IJ SOLN: 3.0000 mL | INTRAMUSCULAR | Status: DC | PRN

## 2015-02-28 MED ORDER — HYDROMORPHONE HCL 1 MG/ML IJ SOLN
INTRAMUSCULAR | Status: AC
Start: 2015-02-28 — End: 2015-02-28
  Administered 2015-02-28: 0.5 mg via INTRAVENOUS
  Filled 2015-02-28: qty 1

## 2015-02-28 MED ORDER — DIPHENHYDRAMINE HCL 25 MG PO CAPS: 25.0000 mg | ORAL_CAPSULE | Freq: Every day | ORAL | Status: DC | PRN

## 2015-02-28 MED ORDER — CEFAZOLIN SODIUM-DEXTROSE 2-3 GM-% IV SOLR
2.0000 g | INTRAVENOUS | Status: AC
  Administered 2015-02-28: 2 g via INTRAVENOUS

## 2015-02-28 MED ORDER — ZOLPIDEM TARTRATE 5 MG PO TABS: 5.0000 mg | ORAL_TABLET | Freq: Every evening | ORAL | Status: DC | PRN

## 2015-02-28 MED ORDER — ACETAMINOPHEN 650 MG RE SUPP
650.0000 mg | RECTAL | Status: DC | PRN
Start: 2015-02-28 — End: 2015-03-01

## 2015-02-28 MED ORDER — LIDOCAINE HCL 4 % MT SOLN
OROMUCOSAL | Status: DC | PRN
  Administered 2015-02-28: 4 mL via TOPICAL

## 2015-02-28 MED ORDER — AMLODIPINE BESYLATE 5 MG PO TABS
5.0000 mg | ORAL_TABLET | Freq: Every day | ORAL | Status: DC
  Administered 2015-03-01: 5 mg via ORAL
  Filled 2015-02-28: qty 1

## 2015-02-28 MED ORDER — ONDANSETRON HCL 4 MG/2ML IJ SOLN: 4.0000 mg | INTRAMUSCULAR | Status: DC | PRN

## 2015-02-28 MED ORDER — POLYETHYLENE GLYCOL 3350 17 G PO PACK
17.0000 g | PACK | Freq: Every day | ORAL | Status: DC | PRN
Start: 2015-02-28 — End: 2015-03-01
  Filled 2015-02-28: qty 1

## 2015-02-28 MED ORDER — LIDOCAINE HCL (CARDIAC) 20 MG/ML IV SOLN
INTRAVENOUS | Status: DC | PRN
  Administered 2015-02-28: 60 mg via INTRAVENOUS

## 2015-02-28 MED ORDER — ACETAMINOPHEN 325 MG PO TABS: 650.0000 mg | ORAL_TABLET | ORAL | Status: DC | PRN

## 2015-02-28 MED ORDER — HYDROMORPHONE HCL 1 MG/ML IJ SOLN
0.2500 mg | INTRAMUSCULAR | Status: DC | PRN
  Administered 2015-02-28 (×4): 0.5 mg via INTRAVENOUS

## 2015-02-28 MED ORDER — LACTATED RINGERS IV SOLN
INTRAVENOUS | Status: DC
  Administered 2015-02-28: 11:00:00 via INTRAVENOUS

## 2015-02-28 MED ORDER — MORPHINE SULFATE 2 MG/ML IJ SOLN
1.0000 mg | INTRAMUSCULAR | Status: DC | PRN
  Administered 2015-03-01 (×2): 2 mg via INTRAVENOUS
  Filled 2015-02-28 (×2): qty 1

## 2015-02-28 MED ORDER — PHENYLEPHRINE HCL 10 MG/ML IJ SOLN
10.0000 mg | INTRAVENOUS | Status: DC | PRN
  Administered 2015-02-28: 20 ug/min via INTRAVENOUS

## 2015-02-28 MED ORDER — GELATIN ABSORBABLE MT POWD
OROMUCOSAL | Status: DC | PRN
  Administered 2015-02-28: 16:00:00 via TOPICAL

## 2015-02-28 MED ORDER — PHENOL 1.4 % MT LIQD: 1.0000 | OROMUCOSAL | Status: DC | PRN

## 2015-02-28 MED ORDER — THROMBIN 20000 UNITS EX SOLR
CUTANEOUS | Status: AC
  Filled 2015-02-28: qty 20000

## 2015-02-28 MED ORDER — ALLOPURINOL 100 MG PO TABS
100.0000 mg | ORAL_TABLET | Freq: Two times a day (BID) | ORAL | Status: DC
  Administered 2015-03-01 (×2): 100 mg via ORAL
  Filled 2015-02-28 (×3): qty 1

## 2015-02-28 MED ORDER — DIAZEPAM 5 MG PO TABS
5.0000 mg | ORAL_TABLET | Freq: Four times a day (QID) | ORAL | Status: DC | PRN
  Administered 2015-02-28: 5 mg via ORAL

## 2015-02-28 MED ORDER — SODIUM CHLORIDE 0.9 % IV SOLN: INTRAVENOUS | Status: DC

## 2015-02-28 MED ORDER — FENTANYL CITRATE (PF) 100 MCG/2ML IJ SOLN
INTRAMUSCULAR | Status: DC | PRN
  Administered 2015-02-28: 50 ug via INTRAVENOUS
  Administered 2015-02-28: 200 ug via INTRAVENOUS
  Administered 2015-02-28 (×2): 150 ug via INTRAVENOUS

## 2015-02-28 MED ORDER — OXYCODONE-ACETAMINOPHEN 5-325 MG PO TABS
1.0000 | ORAL_TABLET | ORAL | Status: DC | PRN
  Administered 2015-02-28: 2 via ORAL
  Administered 2015-03-01 (×2): 1 via ORAL
  Administered 2015-03-01: 2 via ORAL
  Administered 2015-03-01 (×2): 1 via ORAL
  Filled 2015-02-28: qty 1
  Filled 2015-02-28: qty 2
  Filled 2015-02-28 (×3): qty 1

## 2015-02-28 SURGICAL SUPPLY — 101 items
BENZOIN TINCTURE PRP APPL 2/3 (GAUZE/BANDAGES/DRESSINGS) ×8 IMPLANT
BLADE SURG 10 STRL SS (BLADE) ×4 IMPLANT
BLADE SURG ROTATE 9660 (MISCELLANEOUS) IMPLANT
BOLT DECADE 5.5X40 (Bolt) ×6 IMPLANT
BOLT DECADE 5.5X40MM (Bolt) ×2 IMPLANT
BUR PRESCISION 1.7 ELITE (BURR) IMPLANT
BUR ROUND PRECISION 4.0 (BURR) IMPLANT
BUR ROUND PRECISION 4.0MM (BURR)
BUR SABER RD CUTTING 3.0 (BURR) IMPLANT
BUR SABER RD CUTTING 3.0MM (BURR)
CARTRIDGE OIL MAESTRO DRILL (MISCELLANEOUS) ×2 IMPLANT
CLOSURE STERI-STRIP 1/2X4 (GAUZE/BANDAGES/DRESSINGS) ×2
CLOSURE WOUND 1/2 X4 (GAUZE/BANDAGES/DRESSINGS) ×2
CLSR STERI-STRIP ANTIMIC 1/2X4 (GAUZE/BANDAGES/DRESSINGS) ×6 IMPLANT
CONT SPEC STER OR (MISCELLANEOUS) ×4 IMPLANT
COVER BACK TABLE 80X110 HD (DRAPES) ×4 IMPLANT
COVER MAYO STAND STRL (DRAPES) ×8 IMPLANT
COVER SURGICAL LIGHT HANDLE (MISCELLANEOUS) ×4 IMPLANT
DIFFUSER DRILL AIR PNEUMATIC (MISCELLANEOUS) ×4 IMPLANT
DRAIN CHANNEL 15F RND FF W/TCR (WOUND CARE) IMPLANT
DRAPE C-ARM 42X72 X-RAY (DRAPES) ×4 IMPLANT
DRAPE C-ARMOR (DRAPES) ×4 IMPLANT
DRAPE POUCH INSTRU U-SHP 10X18 (DRAPES) ×4 IMPLANT
DRAPE SURG 17X23 STRL (DRAPES) ×16 IMPLANT
DURAPREP 26ML APPLICATOR (WOUND CARE) ×4 IMPLANT
ELECT BLADE 4.0 EZ CLEAN MEGAD (MISCELLANEOUS) ×4
ELECT BLADE 6.5 EXT (BLADE) ×4 IMPLANT
ELECT CAUTERY BLADE 6.4 (BLADE) ×4 IMPLANT
ELECT REM PT RETURN 9FT ADLT (ELECTROSURGICAL) ×4
ELECTRODE BLDE 4.0 EZ CLN MEGD (MISCELLANEOUS) ×2 IMPLANT
ELECTRODE REM PT RTRN 9FT ADLT (ELECTROSURGICAL) ×2 IMPLANT
EVACUATOR SILICONE 100CC (DRAIN) IMPLANT
GAUZE SPONGE 4X4 12PLY STRL (GAUZE/BANDAGES/DRESSINGS) ×8 IMPLANT
GAUZE SPONGE 4X4 16PLY XRAY LF (GAUZE/BANDAGES/DRESSINGS) ×16 IMPLANT
GLOVE BIO SURGEON STRL SZ7 (GLOVE) ×8 IMPLANT
GLOVE BIO SURGEON STRL SZ8 (GLOVE) ×4 IMPLANT
GLOVE BIOGEL PI IND STRL 7.0 (GLOVE) ×2 IMPLANT
GLOVE BIOGEL PI IND STRL 8 (GLOVE) ×2 IMPLANT
GLOVE BIOGEL PI INDICATOR 7.0 (GLOVE) ×2
GLOVE BIOGEL PI INDICATOR 8 (GLOVE) ×2
GOWN STRL REUS W/ TWL LRG LVL3 (GOWN DISPOSABLE) ×4 IMPLANT
GOWN STRL REUS W/ TWL XL LVL3 (GOWN DISPOSABLE) ×4 IMPLANT
GOWN STRL REUS W/TWL LRG LVL3 (GOWN DISPOSABLE) ×4
GOWN STRL REUS W/TWL XL LVL3 (GOWN DISPOSABLE) ×4
GUIDEWIRE BLUNT VIPER II 1.45 (WIRE) ×4 IMPLANT
GUIDEWIRE SHARP VIPER II (WIRE) ×12 IMPLANT
IMPLANT COROENT XL 10X45MM (Orthopedic Implant) ×4 IMPLANT
IV CATH 14GX2 1/4 (CATHETERS) ×4 IMPLANT
KIT BASIN OR (CUSTOM PROCEDURE TRAY) ×4 IMPLANT
KIT DILATOR XLIF 5 (KITS) ×2 IMPLANT
KIT NEEDLE NVM5 EMG ELECT (KITS) ×2 IMPLANT
KIT NEEDLE NVM5 EMG ELECTRODE (KITS) ×2
KIT POSITION SURG JACKSON T1 (MISCELLANEOUS) ×4 IMPLANT
KIT ROOM TURNOVER OR (KITS) ×4 IMPLANT
KIT SURGICAL ACCESS MAXCESS 4 (KITS) ×4 IMPLANT
KIT XLIF (KITS) ×2
MARKER SKIN DUAL TIP RULER LAB (MISCELLANEOUS) ×4 IMPLANT
MIX DBX 10CC 35% BONE (Bone Implant) ×4 IMPLANT
NDL SAFETY ECLIPSE 18X1.5 (NEEDLE) ×2 IMPLANT
NEEDLE 22X1 1/2 (OR ONLY) (NEEDLE) ×4 IMPLANT
NEEDLE BONE MARROW 8GX6 FENEST (NEEDLE) IMPLANT
NEEDLE HYPO 18GX1.5 SHARP (NEEDLE) ×2
NEEDLE HYPO 25GX1X1/2 BEV (NEEDLE) ×4 IMPLANT
NEEDLE JAMSHIDI VIPER (NEEDLE) ×12 IMPLANT
NEEDLE SPNL 18GX3.5 QUINCKE PK (NEEDLE) ×8 IMPLANT
NS IRRIG 1000ML POUR BTL (IV SOLUTION) ×8 IMPLANT
OIL CARTRIDGE MAESTRO DRILL (MISCELLANEOUS) ×4
PACK LAMINECTOMY ORTHO (CUSTOM PROCEDURE TRAY) ×4 IMPLANT
PACK UNIVERSAL I (CUSTOM PROCEDURE TRAY) ×4 IMPLANT
PAD ARMBOARD 7.5X6 YLW CONV (MISCELLANEOUS) ×8 IMPLANT
PATTIES SURGICAL .5 X1 (DISPOSABLE) ×4 IMPLANT
PATTIES SURGICAL .5X1.5 (GAUZE/BANDAGES/DRESSINGS) ×4 IMPLANT
PLATE 2H DECADE 8MM (Plate) ×4 IMPLANT
ROD VIPER2 5.5X45 PRE LARDOSED (Rod) ×4 IMPLANT
SCREW SET SINGLE INNER MIS (Screw) ×8 IMPLANT
SCREW VIPER X-TAB 6.0X40 (Screw) ×4 IMPLANT
SPONGE INTESTINAL PEANUT (DISPOSABLE) ×12 IMPLANT
SPONGE LAP 4X18 X RAY DECT (DISPOSABLE) ×4 IMPLANT
SPONGE SURGIFOAM ABS GEL 100 (HEMOSTASIS) ×4 IMPLANT
STAPLER VISISTAT 35W (STAPLE) ×4 IMPLANT
STRIP CLOSURE SKIN 1/2X4 (GAUZE/BANDAGES/DRESSINGS) ×6 IMPLANT
SURGIFLO TRUKIT (HEMOSTASIS) IMPLANT
SUT MNCRL AB 4-0 PS2 18 (SUTURE) ×8 IMPLANT
SUT VIC AB 0 CT1 18XCR BRD 8 (SUTURE) ×2 IMPLANT
SUT VIC AB 0 CT1 8-18 (SUTURE) ×2
SUT VIC AB 1 CT1 18XCR BRD 8 (SUTURE) ×4 IMPLANT
SUT VIC AB 1 CT1 8-18 (SUTURE) ×4
SUT VIC AB 2-0 CT2 18 VCP726D (SUTURE) ×4 IMPLANT
SYR 20CC LL (SYRINGE) ×4 IMPLANT
SYR BULB IRRIGATION 50ML (SYRINGE) ×4 IMPLANT
SYR CONTROL 10ML LL (SYRINGE) ×8 IMPLANT
SYR TB 1ML LUER SLIP (SYRINGE) ×4 IMPLANT
TABS EXTENSION VIPER 6X35 (Neuro Prosthesis/Implant) ×2 IMPLANT
TAP CANN VIPER2 DL 5.0 (TAP) ×4 IMPLANT
TOWEL OR 17X24 6PK STRL BLUE (TOWEL DISPOSABLE) ×4 IMPLANT
TOWEL OR 17X26 10 PK STRL BLUE (TOWEL DISPOSABLE) ×4 IMPLANT
TRAY FOLEY CATH 16FR SILVER (SET/KITS/TRAYS/PACK) ×4 IMPLANT
TRAY FOLEY CATH 16FRSI W/METER (SET/KITS/TRAYS/PACK) ×4 IMPLANT
WATER STERILE IRR 1000ML POUR (IV SOLUTION) ×4 IMPLANT
X-TABS VIPER 6X35 (Neuro Prosthesis/Implant) ×4 IMPLANT
YANKAUER SUCT BULB TIP NO VENT (SUCTIONS) ×4 IMPLANT

## 2015-02-28 NOTE — Progress Notes (Signed)
Spouse and son  At bedside/ alerted to fact patient remains sedated and not able to be transferred to [facility] at this time/ patient falls asleep seconds after stimulated by name call, does follow commands briefly and is able to clear throat and maintain airway/ room air sats ~92 / famil;y will leave for a meal and return therafter, have been given PACU telephone number if they have concerns during dinner and I have assured them staff would call cell number of son Mazzy Santarelli [phone] ) when relocated to floor.

## 2015-02-28 NOTE — H&P (Signed)
PREOPERATIVE H&P  Chief Complaint: left leg pain  HPI: Robin Alexander is a 81 y.o. female who presents with ongoing pain in the left leg  MRI reveals severe NF stenosis on the left at L3/4  Patient has failed multiple forms of conservative care and continues to have pain (see office notes for additional details regarding the patient's full course of treatment)  Past Medical History  Diagnosis Date  . HTN (hypertension)   . Arthritis   . Chicken pox   . High cholesterol   . Heart murmur   . Pneumonia     spring 2015  . GERD (gastroesophageal reflux disease)    Past Surgical History  Procedure Laterality Date  . Total hip arthroplasty  2005 & 2006    Bilateral, [place]  . Appendectomy  1950  . Abdominal hysterectomy      in 50's  . Cesarean section      2  . Cataract extraction w/ intraocular lens  implant, bilateral    . Rotator cuff repair      right shoulder   10 YRS   History   Social History  . Marital Status: Married    Spouse Name: N/A  . Number of Children: 2  . Years of Education: N/A   Occupational History  . Retired - Press photographer    Social History Main Topics  . Smoking status: Former Smoker -- 0.75 packs/day for 20 years    Types: Cigarettes    Quit date: 09/02/1983  . Smokeless tobacco: Never Used  . Alcohol Use: 0.0 oz/week    0 Standard drinks or equivalent per week     Comment: Occasional wine  . Drug Use: No  . Sexual Activity: Not on file   Other Topics Concern  . None   Social History Narrative   Regular Exercise -  Active but no regular exercise routine   Daily Caffeine Use:  NO         Family History  Problem Relation Age of Onset  . Arthritis Mother   . Heart disease Mother   . Arthritis Father   . Stroke Brother   . Heart disease Brother   . Cancer Neg Hx    Allergies  Allergen Reactions  . Latex     hives  . Codeine Itching   Prior to Admission medications   Medication Sig Start Date End Date Taking?  Authorizing Provider  acetaminophen (TYLENOL) 500 MG tablet Take 500 mg by mouth every 8 (eight) hours as needed for mild pain or moderate pain.   Yes Historical Provider, MD  allopurinol (ZYLOPRIM) 100 MG tablet Take 2 tablets (200 mg total) by mouth daily. Patient taking differently: Take 100 mg by mouth 2 (two) times daily.  08/03/14  Yes Lyndal Pulley, DO  amLODipine-benazepril (LOTREL) 5-20 MG per capsule Take 1 capsule by mouth  daily 12/19/14  Yes Minna Merritts, MD  aspirin EC 81 MG tablet Take 81 mg by mouth daily.     Yes Historical Provider, MD  cholecalciferol (VITAMIN D) 1000 UNITS tablet Take 1,000 Units by mouth daily.     Yes Historical Provider, MD  Diclofenac Sodium 2 % SOLN Apply twice daily. Patient taking differently: Apply 1 application topically 2 (two) times daily as needed. Apply twice daily. 06/23/14  Yes Lyndal Pulley, DO  diphenhydrAMINE (BENADRYL) 25 MG tablet Take 25 mg by mouth daily as needed for allergies.    Yes Historical Provider, MD  gabapentin (NEURONTIN) 100 MG capsule Take 1 capsule (100 mg total) by mouth at bedtime. 12/12/14  Yes Lyndal Pulley, DO  glucosamine-chondroitin 500-400 MG tablet Take 1 tablet by mouth daily.   Yes Historical Provider, MD  HYDROcodone-acetaminophen (NORCO/VICODIN) 5-325 MG per tablet Take 1 tablet by mouth every 6 (six) hours as needed. 01/23/15  Yes Historical Provider, MD  ibuprofen (ADVIL,MOTRIN) 200 MG tablet Take 400 mg by mouth every 8 (eight) hours as needed for mild pain or moderate pain.   Yes Historical Provider, MD  Multiple Vitamin (MULTIVITAMIN) capsule Take 1 capsule by mouth daily.     Yes Historical Provider, MD  Omega-3 Fatty Acids (FISH OIL) 1200 MG CAPS Take 1 capsule by mouth 2 (two) times daily.    Yes Historical Provider, MD  omeprazole (PRILOSEC) 40 MG capsule Take 1 capsule by mouth  daily 11/07/14  Yes Jackolyn Confer, MD  Sennosides-Docusate Sodium ([employer] STOOL SOFTENER/LAXATIVE PO) Take 1 capsule by  mouth daily as needed.    Yes Historical Provider, MD  simvastatin (ZOCOR) 20 MG tablet Take 1 tablet by mouth at  bedtime 12/27/14  Yes Jackolyn Confer, MD  Turmeric 500 MG CAPS Take 1 capsule by mouth daily.   Yes Historical Provider, MD  vitamin C (ASCORBIC ACID) 500 MG tablet Take 500 mg by mouth daily.     Yes Historical Provider, MD     All other systems have been reviewed and were otherwise negative with the exception of those mentioned in the HPI and as above.  Physical Exam: Filed Vitals:   02/28/15 1017  BP: 192/67  Pulse: 82  Temp: 98 F (36.7 C)  Resp: 20    General: Alert, no acute distress Cardiovascular: No pedal edema Respiratory: No cyanosis, no use of accessory musculature Skin: No lesions in the area of chief complaint Neurologic: Sensation intact distally Psychiatric: Patient is competent for consent with normal mood and affect Lymphatic: No axillary or cervical lymphadenopathy   Assessment/Plan: Radiculopathy Plan for Procedure(s): ANTERIOR LATERAL LUMBAR FUSION 1 LEVEL POSTERIOR LUMBAR FUSION 1 LEVEL   Sinclair Ship, MD 02/28/2015 2:16 PM

## 2015-02-28 NOTE — Anesthesia Procedure Notes (Signed)
Procedure Name: Intubation Date/Time: 02/28/2015 3:03 PM Performed by: Izora Gala Pre-anesthesia Checklist: Patient identified, Emergency Drugs available, Suction available and Patient being monitored Patient Re-evaluated:Patient Re-evaluated prior to inductionOxygen Delivery Method: Circle system utilized Preoxygenation: Pre-oxygenation with 100% oxygen Intubation Type: IV induction Ventilation: Mask ventilation without difficulty Laryngoscope Size: Miller and 3 Grade View: Grade I Tube type: Oral Tube size: 7.5 mm Number of attempts: 1 Airway Equipment and Method: Stylet and LTA kit utilized Placement Confirmation: ETT inserted through vocal cords under direct vision,  positive ETCO2,  CO2 detector and breath sounds checked- equal and bilateral Secured at: 22 cm Tube secured with: Tape Dental Injury: Teeth and Oropharynx as per pre-operative assessment

## 2015-02-28 NOTE — Transfer of Care (Signed)
Immediate Anesthesia Transfer of Care Note  Patient: Robin Alexander  Procedure(s) Performed: Procedure(s) with comments: ANTERIOR LATERAL LUMBAR FUSION 1 LEVEL (Right) - Right sided lumbar 3-4 lateral interbody fusion POSTERIOR LUMBAR FUSION 1 LEVEL (N/A) - Lumbar 3-4 Posterior spinal fusion with instrumentation and allograft  Patient Location: PACU  Anesthesia Type:General  Level of Consciousness: awake, oriented and patient cooperative  Airway & Oxygen Therapy: Patient Spontanous Breathing and Patient connected to face mask oxygen  Post-op Assessment: Report given to RN, Post -op Vital signs reviewed and stable and Patient moving all extremities  Post vital signs: Reviewed and stable  Last Vitals:  Filed Vitals:   02/28/15 1017  BP: 192/67  Pulse: 82  Temp: 36.7 C  Resp: 20    Complications: No apparent anesthesia complications

## 2015-02-28 NOTE — Progress Notes (Signed)
Dr Oletta Lamas at bedside to assess d/t profound drowsiness/ determines no need to give narcan or flumazenil/ pt responds to commands appropriately and answers questions appropriately despite profound sleepiness / will monitor and continue to stimulate her breathe deeply and cough

## 2015-02-28 NOTE — Progress Notes (Signed)
Pt instructed on how to use incentive spirometer and able to use properly.

## 2015-02-28 NOTE — Anesthesia Preprocedure Evaluation (Addendum)
Anesthesia Evaluation  Patient identified by MRN, date of birth, ID band Patient awake    Reviewed: Allergy & Precautions, NPO status , Patient's Chart, lab work & pertinent test results  Airway Mallampati: II  TM Distance: >3 FB Neck ROM: Full    Dental no notable dental hx.    Pulmonary neg pulmonary ROS, pneumonia -, former smoker,  breath sounds clear to auscultation  Pulmonary exam normal       Cardiovascular hypertension, Pt. on medications negative cardio ROS Normal cardiovascular exam+ Valvular Problems/Murmurs Rhythm:Regular Rate:Normal     Neuro/Psych negative psych ROS   GI/Hepatic Neg liver ROS, GERD-  ,  Endo/Other  negative endocrine ROS  Renal/GU negative Renal ROS     Musculoskeletal  (+) Arthritis -,   Abdominal   Peds  Hematology negative hematology ROS (+)   Anesthesia Other Findings   Reproductive/Obstetrics negative OB ROS                            Anesthesia Physical Anesthesia Plan  ASA: III  Anesthesia Plan: General   Post-op Pain Management:    Induction: Intravenous  Airway Management Planned: Oral ETT  Additional Equipment: None  Intra-op Plan:   Post-operative Plan: Extubation in OR  Informed Consent: I have reviewed the patients History and Physical, chart, labs and discussed the procedure including the risks, benefits and alternatives for the proposed anesthesia with the patient or authorized representative who has indicated his/her understanding and acceptance.   Dental advisory given  Plan Discussed with: CRNA and Surgeon  Anesthesia Plan Comments:        Anesthesia Quick Evaluation

## 2015-03-01 ENCOUNTER — Encounter (HOSPITAL_COMMUNITY): Payer: Self-pay | Admitting: Orthopedic Surgery

## 2015-03-01 NOTE — Progress Notes (Signed)
Patient voided a total of 200cc post foley removal. MD aware and ok with patient discharging. Discussed discharge instructions and medications with patient and husband. Both verbalized understanding with all questions answered. VSS. Pt discharged home with husband  [facility]

## 2015-03-01 NOTE — Op Note (Signed)
Robin Alexander, Robin Alexander              ACCOUNT NO.:  [id number]  MEDICAL RECORD NO.:  [id number]  LOCATION:  3S10C                        FACILITY:  [facility]  PHYSICIAN:  Phylliss Bob, MD      DATE OF BIRTH:  1933/05/26  DATE OF PROCEDURE:  02/28/2015                              OPERATIVE REPORT   PREOPERATIVE DIAGNOSES: 1. Severe left-sided neural foraminal stenosis, L3-4. 2. Segmental collapse across the L3-4 interspace. 3. Grade 1 spondylolisthesis, L3L4.  POSTOPERATIVE DIAGNOSES: 1. Severe left-sided neural foraminal stenosis, L3-4. 2. Segmental collapse across the L3-4 interspace. 3. Grade 1 spondylolisthesis, L3-4.  PROCEDURES: 1. Anterior lumbar interbody fusion via a right-sided lateral     interbody approach, L3-4. 2. Insertion of interbody device x1 (10 x 45 mm NuVasive     intervertebral spacer). 3. Placement of anterior instrumentation, L3-4. 4. Intraoperative use of fluoroscopy. 5. Use of morselized allograft-DBX mix. 6. Placement of posterior instrumentation L3, L4 (6 x 40 and 6 x 35 mm     pedicle screws). 7. Posterior spinal fusion, L3-4.  SURGEON:  Phylliss Bob, MD  ASSISTANT:  Pricilla Holm, PA-C.  ANESTHESIA:  General endotracheal anesthesia.  COMPLICATIONS:  None.  DISPOSITION:  Stable.  ESTIMATED BLOOD LOSS:  Minimal.  INDICATIONS FOR SURGERY:  Briefly, Robin Alexander is a very pleasant 81- year-old female, who did present to me with ongoing and rather debilitating pain in the left anterior thigh.  The patient's pain was very much consistent with left L3 radiculopathy.  The patient's MRI did clearly reveal neuroforaminal stenosis on the left at L3-4.  Of note, the patient is status post lumbar decompressive procedure.  Radiographs revealed a substantial segmental collapse and spondylolisthesis across L3-4.  The patient did fail multiple forms of conservative care, and we did discuss proceeding with the procedure reflected above.  DESCRIPTION OF  PROCEDURE:  On February 28, 2015, the patient was brought to surgery and general endotracheal anesthesia was administered. and antibiotics were given.  The patient was placed in the lateral decubitus position with the right side up.  An axillary roll was placed under the patient's left axilla.  The hips and knees were flexed and all bony prominences were meticulously padded.  The bed was appropriately positioned.  In order to obtain a perfect AP and lateral fluoroscopic images.  I then secured the patient to the bed using extensive tape. The region of the right flank was then prepped and draped and a time-out procedure was performed.  A right-sided transverse incision was made in line with the L3-4 intervertebral space.  The external and internal oblique musculature was dissected.  The transversalis fascia was entered and the retroperitoneal space was identified and the peritoneum was retracted anteriorly.  The psoas muscle was readily noted.  I then advanced the initial dilator through the psoas muscle which was docked over the L3-4 intervertebral space.  I did use triggered EMG while advancing the initial dilator to ensure that it was not in the vicinity of any neurologic structures, which it was not.  I then sequentially dilated and a self-retaining retractor was advanced over the dilators and secured to the bed using a rigid arm.  The retractor was then  slightly distracted.  I was very pleased with the resting position of the retractor.  There was no neurologic structures noted in the vicinity of the exposure, which was confirmed using monopolar EMG testing.  I then performed a standard diskectomy.  The entire intervertebral disk was removed from the right anulus to the left.  I then advanced a series of intervertebral spacer trials, and I did feel that a 10 mm spacer would be the most appropriate fit.  After preparing the endplates, a 10 x 45 x 18 mm NuVasive spacer was packed with the  DBX mix and tamped across the intervertebral space.  I was very pleased with the restoration of the foraminal height on the left side, which I did feel did provide adequate decompression of the exiting L3 nerve.  I then chose the appropriate-sized anterior lumbar plate, which was placed over the right side of the L3 and L4 vertebral bodies.  The 40 mm screws were advanced through the plate, 1 in each vertebral body at L3 and at L4. The screws were locked to the plate using the locking mechanism.  The plate was then locked as well.  I was very pleased with the final AP and lateral fluoroscopic images.  The wound was then copiously irrigated and closed in a series of layers using #1 Vicryl, followed by 2-0 Vicryl, and followed by 3-0 Monocryl.  I then turned my attention towards the left posterolateral gutter.  A right paramedian incision was made 1 cm lateral to the pedicles.  Using a cerebellar retractor, I did expose the posterolateral gutter at L3 and at L4.  The transverse processes were decorticated and DBX mix was packed into the posterolateral gutter to help aid in the success of the fusion.  I then advanced Jamshidi needles over the L3 and L4 pedicles.  Guidewires were placed and a 5 mm tap was advanced over the guidewires.  I did use triggered EMG to test each of the taps, and there was no tap that tested below 20 milliamps.  I then placed a 6 x 40 mm screw at L3 and a 6 x 35 mm screw at L4.  A 45 mm rod was secured over the heads of the screws and caps were placed followed by a final locking procedure.  I was very pleased with the final AP and lateral fluoroscopic images.  I did liberally use fluoroscopy for both the anterior and posterior aspects of the procedure.  The wound was then copiously irrigated and closed in layers as previously described. Benzoin and Steri-Strips were then applied followed by sterile dressing over both incisions.  The patient was then transferred to a  hospital bed and awoken from general endotracheal anesthesia in stable condition.  Of note, there was no abnormal sustained EMG activity noted throughout the surgery.  Pricilla Holm was my assistant throughout surgery, and did aid in retraction, suctioning, and closure.     Phylliss Bob, MD     MD/MEDQ  D:  02/28/2015  T:  03/01/2015  Job:  [id number]  cc:   Ronette Deter, MD Hulan Saas, DO

## 2015-03-01 NOTE — Progress Notes (Addendum)
UR COMPLETED  

## 2015-03-01 NOTE — Evaluation (Signed)
Occupational Therapy Evaluation Patient Details Name: Robin Alexander MRN: [id number] DOB: Jul 23, 1933 Today's Date: 03/01/2015    History of Present Illness s/p L3/4 lateral'posterior fusion   Clinical Impression   Pt admitted with above.  She is doing very well first day post op.  She does require assist for LB ADLs as she is unable to access feet without bending.  Discussed options and she plans to have spouse assist her.  All education completed.  She requires min guard to supervision with functional mobility.  No further OT needs identified.     Follow Up Recommendations  No OT follow up;Supervision/Assistance - 24 hour    Equipment Recommendations  None recommended by OT    Recommendations for Other Services       Precautions / Restrictions Precautions Precautions: Back;Fall Precaution Booklet Issued: Yes (comment) Restrictions Weight Bearing Restrictions: No      Mobility Bed Mobility               General bed mobility comments: in chair  Transfers Overall transfer level: Needs assistance Equipment used: Rolling walker (2 wheeled) Transfers: Sit to/from Stand Sit to Stand: Min assist         General transfer comment: cues for hand placement and steadying assist needed once up initially.      Balance Overall balance assessment: Needs assistance Sitting-balance support: Feet supported Sitting balance-Leahy Scale: Good     Standing balance support: During functional activity Standing balance-Leahy Scale: Fair Standing balance comment: can stand statically without UE support.                             ADL Overall ADL's : Needs assistance/impaired Eating/Feeding: Independent;Sitting   Grooming: Wash/dry hands;Wash/dry face;Oral care;Brushing hair;Supervision/safety;Standing Grooming Details (indicate cue type and reason): Pt instructed in safe technique for brushing teeth - avoid bending  Upper Body Bathing: Set up;Sitting   Lower  Body Bathing: Moderate assistance;Sit to/from stand Lower Body Bathing Details (indicate cue type and reason): Discussed use of LH sponge, but pt states spouse will assist her.  Upper Body Dressing : Set up;Sitting   Lower Body Dressing: Moderate assistance;Sit to/from stand Lower Body Dressing Details (indicate cue type and reason): Pt is unable to cross ankles over knees to access feet.  Discussed options.  Pt and spouse report that spouse will assist her.  Toilet Transfer: Supervision/safety;Ambulation;Comfort height toilet;RW   Toileting- Clothing Manipulation and Hygiene: Supervision/safety;Sit to/from stand   Tub/ Shower Transfer: Min guard;Walk-in shower;Ambulation;Rolling walker Tub/Shower Transfer Details (indicate cue type and reason): Pt reports MD instructed her to wait 5 days to shower.  Anticipate pt will be able to stand mod I to shower by that time.   Discussed using 3in1 commode with pt if she feels she needs to use a seat  Functional mobility during ADLs: Min guard;Rolling walker General ADL Comments: Pt able to state 3/3 precautions.  Reviewed safety with IADL.   Also discussed use of reacher and where to purchase if pt does not have one at home      Vision     Perception     Praxis      Pertinent Vitals/Pain Pain Assessment: 0-10 Pain Score: 3  Pain Location: LBP Pain Descriptors / Indicators: Aching Pain Intervention(s): Monitored during session     Hand Dominance Right   Extremity/Trunk Assessment Upper Extremity Assessment Upper Extremity Assessment: Overall WFL for tasks assessed   Lower Extremity Assessment Lower Extremity  Assessment: Defer to PT evaluation   Cervical / Trunk Assessment Cervical / Trunk Assessment: Normal   Communication Communication Communication: No difficulties   Cognition Arousal/Alertness: Awake/alert Behavior During Therapy: WFL for tasks assessed/performed Overall Cognitive Status: Within Functional Limits for tasks  assessed                     General Comments       Exercises Exercises: General Lower Extremity     Shoulder Instructions      Home Living Family/patient expects to be discharged to:: Private residence Living Arrangements: Spouse/significant other;Other relatives Available Help at Discharge: Family;Available 24 hours/day Type of Home: House Home Access: Stairs to enter [employer] of Steps: 2 Entrance Stairs-Rails: Right;Left;Can reach both Home Layout: One level     Bathroom Shower/Tub: Occupational psychologist: Handicapped height     Home Equipment: Cane - single point;Bedside commode;Other (comment) (bed rail on bed)   Additional Comments: Pt report she thinks she has a reacher, but is not sure.       Prior Functioning/Environment Level of Independence: Independent             OT Diagnosis: Generalized weakness;Acute pain   OT Problem List:     OT Treatment/Interventions:      OT Goals(Current goals can be found in the care plan section) Acute Rehab OT Goals Patient Stated Goal: to go home OT Goal Formulation: All assessment and education complete, DC therapy  OT Frequency:     Barriers to D/C:            Co-evaluation              End of Session Equipment Utilized During Treatment: Rolling walker Nurse Communication: Mobility status  Activity Tolerance: Patient tolerated treatment well Patient left: in chair;with call bell/phone within reach;with family/visitor present   Time: 1004-1027 OT Time Calculation (min): 23 min Charges:  OT General Charges $OT Visit: 1 Procedure OT Evaluation $Initial OT Evaluation Tier I: 1 Procedure OT Treatments $Self Care/Home Management : 8-22 mins G-Codes:    Tarri Guilfoil M Mar 22, 2015, 10:38 AM

## 2015-03-01 NOTE — Progress Notes (Signed)
    Patient doing well Patient denies left or right leg pain Minimal back pain   Physical Exam: Filed Vitals:   03/01/15 0600  BP: 151/60  Pulse: 76  Temp:   Resp: 10   Patient looks very comfortable Dressing in place NVI  POD #1 s/p L3/4 lateral'posterior fusion, doing very well with minimal pain, patient very motivated to go home  - up with PT this AM, encourage ambulation - Percocet for pain, Valium for muscle spasms - likely d/c home today

## 2015-03-01 NOTE — Evaluation (Signed)
Physical Therapy Evaluation Patient Details Name: Robin Alexander MRN: [id number] DOB: [birth year] Today's Date: 03/01/2015   History of Present Illness  s/p L3/4 lateral'posterior fusion  Clinical Impression  Pt admitted with above diagnosis. Pt currently with functional limitations due to the deficits listed below (see PT Problem List). Pt able to ambulate with RW without difficulty.  Will have 24 hour care at home by husband.  Will benefit from home health PT initially as well.   Needs RW.   Pt will benefit from skilled PT to increase their independence and safety with mobility to allow discharge to the venue listed below.      Follow Up Recommendations Home health PT;Supervision/Assistance - 24 hour    Equipment Recommendations  Rolling walker with 5" wheels    Recommendations for Other Services       Precautions / Restrictions Precautions Precautions: Back;Fall Precaution Booklet Issued: Yes (comment) Restrictions Weight Bearing Restrictions: No      Mobility  Bed Mobility               General bed mobility comments: in chair  Transfers Overall transfer level: Needs assistance Equipment used: Rolling walker (2 wheeled) Transfers: Sit to/from Stand Sit to Stand: Min assist         General transfer comment: cues for hand placement and steadying assist needed once up initially.    Ambulation/Gait Ambulation/Gait assistance: Min assist;+2 safety/equipment Ambulation Distance (Feet): 200 Feet Assistive device: Rolling walker (2 wheeled) Gait Pattern/deviations: Step-through pattern;Decreased stride length   Gait velocity interpretation: Below normal speed for age/gender General Gait Details: Pt ambulated with RW with good sequencing of steps and RW with some assist with steering RW.    Stairs            Wheelchair Mobility    Modified Rankin (Stroke Patients Only)       Balance Overall balance assessment: Needs assistance;History of Falls          Standing balance support: Bilateral upper extremity supported;During functional activity Standing balance-Leahy Scale: Fair Standing balance comment: can stand statically without UE support.                              Pertinent Vitals/Pain Pain Assessment: 0-10 Pain Score: 5  Pain Location: right LE and low back Pain Descriptors / Indicators: Aching;Sore Pain Intervention(s): Limited activity within patient's tolerance;Monitored during session;Repositioned;Premedicated before session  VSSwith HR 78 bpm, 94% O2 on RA.     Home Living Family/patient expects to be discharged to:: Private residence Living Arrangements: Spouse/significant other;Other relatives Available Help at Discharge: Family;Available 24 hours/day Type of Home: House Home Access: Stairs to enter Entrance Stairs-Rails: Right;Left;Can reach both Entrance Stairs-Number of Steps: 2 Home Layout: One level Home Equipment: Cane - single point;Bedside commode;Other (comment) (bed rail on bed)      Prior Function Level of Independence: Independent               Hand Dominance        Extremity/Trunk Assessment   Upper Extremity Assessment: Defer to OT evaluation           Lower Extremity Assessment: Generalized weakness      Cervical / Trunk Assessment: Normal  Communication   Communication: No difficulties  Cognition Arousal/Alertness: Awake/alert Behavior During Therapy: WFL for tasks assessed/performed Overall Cognitive Status: Within Functional Limits for tasks assessed  General Comments      Exercises General Exercises - Lower Extremity Ankle Circles/Pumps: AROM;Both;5 reps;Seated Long Arc Quad: AROM;Both;5 reps;Seated      Assessment/Plan    PT Assessment Patient needs continued PT services  PT Diagnosis Generalized weakness;Acute pain   PT Problem List Decreased activity tolerance;Decreased balance;Decreased mobility;Decreased  knowledge of use of DME;Decreased safety awareness;Decreased knowledge of precautions;Pain  PT Treatment Interventions DME instruction;Gait training;Stair training;Functional mobility training;Therapeutic activities;Therapeutic exercise;Balance training;Patient/family education   PT Goals (Current goals can be found in the Care Plan section) Acute Rehab PT Goals Patient Stated Goal: to go home PT Goal Formulation: With patient Time For Goal Achievement: 03/08/15 Potential to Achieve Goals: Good    Frequency Min 5X/week   Barriers to discharge        Co-evaluation               End of Session Equipment Utilized During Treatment: Gait belt Activity Tolerance: Patient limited by fatigue;Patient limited by pain Patient left: in chair;with call bell/phone within reach;with family/visitor present Nurse Communication: Mobility status         Time: 4360-6770 PT Time Calculation (min) (ACUTE ONLY): 23 min   Charges:   PT Evaluation $Initial PT Evaluation Tier I: 1 Procedure PT Treatments $Gait Training: 8-22 mins   PT G CodesDenice Paradise 03/29/2015, 10:27 AM [facility] [phone] (pager)

## 2015-03-02 NOTE — Anesthesia Postprocedure Evaluation (Signed)
Anesthesia Post Note  Patient: Robin Alexander  Procedure(s) Performed: Procedure(s) (LRB): ANTERIOR LATERAL LUMBAR FUSION 1 LEVEL (Right) POSTERIOR LUMBAR FUSION 1 LEVEL (N/A)  Anesthesia type: General  Patient location: PACU  Post pain: Pain level controlled and Adequate analgesia  Post assessment: Post-op Vital signs reviewed, Patient's Cardiovascular Status Stable, Respiratory Function Stable, Patent Airway and Pain level controlled  Last Vitals:  Filed Vitals:   03/01/15 1519  BP: 126/51  Pulse: 75  Temp: 36.8 C  Resp: 16    Post vital signs: Reviewed and stable  Level of consciousness: awake, alert  and oriented  Complications: No apparent anesthesia complications

## 2015-03-03 DIAGNOSIS — M9953 Intervertebral disc stenosis of neural canal of lumbar region: Secondary | ICD-10-CM | POA: Diagnosis not present

## 2015-03-03 DIAGNOSIS — Z4789 Encounter for other orthopedic aftercare: Secondary | ICD-10-CM | POA: Diagnosis not present

## 2015-03-03 DIAGNOSIS — M4316 Spondylolisthesis, lumbar region: Secondary | ICD-10-CM | POA: Diagnosis not present

## 2015-03-03 DIAGNOSIS — I10 Essential (primary) hypertension: Secondary | ICD-10-CM | POA: Diagnosis not present

## 2015-03-03 DIAGNOSIS — M1990 Unspecified osteoarthritis, unspecified site: Secondary | ICD-10-CM | POA: Diagnosis not present

## 2015-03-03 DIAGNOSIS — K219 Gastro-esophageal reflux disease without esophagitis: Secondary | ICD-10-CM | POA: Diagnosis not present

## 2015-03-05 DIAGNOSIS — M1990 Unspecified osteoarthritis, unspecified site: Secondary | ICD-10-CM | POA: Diagnosis not present

## 2015-03-05 DIAGNOSIS — Z4789 Encounter for other orthopedic aftercare: Secondary | ICD-10-CM | POA: Diagnosis not present

## 2015-03-05 DIAGNOSIS — K219 Gastro-esophageal reflux disease without esophagitis: Secondary | ICD-10-CM | POA: Diagnosis not present

## 2015-03-05 DIAGNOSIS — M9953 Intervertebral disc stenosis of neural canal of lumbar region: Secondary | ICD-10-CM | POA: Diagnosis not present

## 2015-03-05 DIAGNOSIS — I10 Essential (primary) hypertension: Secondary | ICD-10-CM | POA: Diagnosis not present

## 2015-03-05 DIAGNOSIS — M4316 Spondylolisthesis, lumbar region: Secondary | ICD-10-CM | POA: Diagnosis not present

## 2015-03-07 DIAGNOSIS — M4316 Spondylolisthesis, lumbar region: Secondary | ICD-10-CM | POA: Diagnosis not present

## 2015-03-07 DIAGNOSIS — M1990 Unspecified osteoarthritis, unspecified site: Secondary | ICD-10-CM | POA: Diagnosis not present

## 2015-03-07 DIAGNOSIS — M9953 Intervertebral disc stenosis of neural canal of lumbar region: Secondary | ICD-10-CM | POA: Diagnosis not present

## 2015-03-07 DIAGNOSIS — K219 Gastro-esophageal reflux disease without esophagitis: Secondary | ICD-10-CM | POA: Diagnosis not present

## 2015-03-07 DIAGNOSIS — Z4789 Encounter for other orthopedic aftercare: Secondary | ICD-10-CM | POA: Diagnosis not present

## 2015-03-07 DIAGNOSIS — I10 Essential (primary) hypertension: Secondary | ICD-10-CM | POA: Diagnosis not present

## 2015-03-08 NOTE — Discharge Summary (Signed)
Patient ID: Robin Alexander MRN: [id number] DOB/AGE: 1933/07/10 81 y.o.  Admit date: 02/28/2015 Discharge date: 03/01/2015  Admission Diagnoses:  Active Problems:   Radiculopathy   Discharge Diagnoses:  Same  Past Medical History  Diagnosis Date  . HTN (hypertension)   . Arthritis   . Chicken pox   . High cholesterol   . Heart murmur   . Pneumonia     spring 2015  . GERD (gastroesophageal reflux disease)     Surgeries: Procedure(s): ANTERIOR LATERAL LUMBAR FUSION 1 LEVEL L3-4 POSTERIOR LUMBAR FUSION 1 LEVEL L3-4 on 02/28/2015   Consultants:  None  Discharged Condition: Improved  Hospital Course: Robin Alexander is an 81 y.o. female who was admitted 02/28/2015 for operative treatment of radiculopathy. Patient has severe unremitting pain that affects sleep, daily activities, and work/hobbies. After pre-op clearance the patient was taken to the operating room on 02/28/2015 and underwent  Procedure(s): ANTERIOR LATERAL LUMBAR FUSION 1 LEVEL L3-4 POSTERIOR LUMBAR FUSION 1 LEVEL L3-4.    Patient was given perioperative antibiotics:  Anti-infectives    Start     Dose/Rate Route Frequency Ordered Stop   02/28/15 2359  ceFAZolin (ANCEF) IVPB 1 g/50 mL premix     1 g 100 mL/hr over 30 Minutes Intravenous Every 8 hours 02/28/15 2315 03/01/15 0849   02/28/15 1047  ceFAZolin (ANCEF) IVPB 2 g/50 mL premix     2 g 100 mL/hr over 30 Minutes Intravenous On call to O.R. 02/28/15 1047 02/28/15 1540       Patient was given sequential compression devices, early ambulation to prevent DVT.  Patient benefited maximally from hospital stay and there were no complications.    Recent vital signs: BP 126/51 mmHg  Pulse 75  Temp(Src) 98.3 F (36.8 C) (Oral)  Resp 16  Ht 5' (1.524 m)  Wt 71.4 kg (157 lb 6.5 oz)  BMI 30.74 kg/m2  SpO2 92%  Discharge Medications:     Medication List    STOP taking these medications        acetaminophen 500 MG tablet  Commonly known as:   TYLENOL      TAKE these medications        allopurinol 100 MG tablet  Commonly known as:  ZYLOPRIM  Take 2 tablets (200 mg total) by mouth daily.     amLODipine-benazepril 5-20 MG per capsule  Commonly known as:  LOTREL  Take 1 capsule by mouth  daily     aspirin EC 81 MG tablet  Take 81 mg by mouth daily.     cholecalciferol 1000 UNITS tablet  Commonly known as:  VITAMIN D  Take 1,000 Units by mouth daily.     [employer] STOOL SOFTENER/LAXATIVE PO  Take 1 capsule by mouth daily as needed.     diphenhydrAMINE 25 MG tablet  Commonly known as:  BENADRYL  Take 25 mg by mouth daily as needed for allergies.     gabapentin 100 MG capsule  Commonly known as:  NEURONTIN  Take 1 capsule (100 mg total) by mouth at bedtime.     glucosamine-chondroitin 500-400 MG tablet  Take 1 tablet by mouth daily.     multivitamin capsule  Take 1 capsule by mouth daily.     omeprazole 40 MG capsule  Commonly known as:  PRILOSEC  Take 1 capsule by mouth  daily     simvastatin 20 MG tablet  Commonly known as:  ZOCOR  Take 1 tablet by mouth at  bedtime  Turmeric 500 MG Caps  Take 1 capsule by mouth daily.     vitamin C 500 MG tablet  Commonly known as:  ASCORBIC ACID  Take 500 mg by mouth daily.        Diagnostic Studies: Dg Chest 2 View  02/23/2015   CLINICAL DATA:  81 year old female preoperative study for spine surgery. Initial encounter.  EXAM: CHEST  2 VIEW  COMPARISON:  Chest CTA 03/15/2014 and earlier.  FINDINGS: Stable lung volumes. Stable cardiomegaly and mediastinal contours. Diffuse calcified atherosclerosis of the aorta. Visualized tracheal air column is within normal limits. No pneumothorax, pulmonary edema, pleural effusion or confluent pulmonary opacity. Stable small calcified granulomas in the right lung. No acute osseous abnormality identified. Cholelithiasis re- identified on the lateral view.  IMPRESSION: 1.  No acute cardiopulmonary abnormality. 2. Cardiomegaly and  extensive aortic calcified atherosclerosis. 3. Cholelithiasis.   Electronically Signed   By: Genevie Ann M.D.   On: 02/23/2015 14:17   Dg Lumbar Spine 2-3 Views  02/28/2015   CLINICAL DATA:  Fixation at L4-5.  EXAM: LUMBAR SPINE - 2-3 VIEW; DG C-ARM GT 120 MIN  COMPARISON:  MRI of 01/19/2015  FINDINGS: Four intraoperative views. Aortic atherosclerosis. Lateral screw fixation at a single lumbar level. Difficult to localize on the lateral views, secondary to artifact.  IMPRESSION: Intraoperative imaging of lumbar spine fixation.  Aortic atherosclerosis.   Electronically Signed   By: Abigail Miyamoto M.D.   On: 02/28/2015 18:19   Dg C-arm Gt 120 Min  02/28/2015   CLINICAL DATA:  Fixation at L4-5.  EXAM: LUMBAR SPINE - 2-3 VIEW; DG C-ARM GT 120 MIN  COMPARISON:  MRI of 01/19/2015  FINDINGS: Four intraoperative views. Aortic atherosclerosis. Lateral screw fixation at a single lumbar level. Difficult to localize on the lateral views, secondary to artifact.  IMPRESSION: Intraoperative imaging of lumbar spine fixation.  Aortic atherosclerosis.   Electronically Signed   By: Abigail Miyamoto M.D.   On: 02/28/2015 18:19    Disposition: 01-Home or Self Care   POD #1 s/p L3/4 lateral'posterior fusion, doing very well with minimal pain, patient very motivated to go home  -Written scripts for pain signed and in chart -D/C instructions sheet printed and in chart -D/C today  -F/U in office 2 weeks   Signed: Justice Britain 03/08/2015, 12:32 PM

## 2015-03-12 DIAGNOSIS — Z4789 Encounter for other orthopedic aftercare: Secondary | ICD-10-CM | POA: Diagnosis not present

## 2015-03-12 DIAGNOSIS — M4316 Spondylolisthesis, lumbar region: Secondary | ICD-10-CM | POA: Diagnosis not present

## 2015-03-12 DIAGNOSIS — K219 Gastro-esophageal reflux disease without esophagitis: Secondary | ICD-10-CM | POA: Diagnosis not present

## 2015-03-12 DIAGNOSIS — M1990 Unspecified osteoarthritis, unspecified site: Secondary | ICD-10-CM | POA: Diagnosis not present

## 2015-03-12 DIAGNOSIS — I10 Essential (primary) hypertension: Secondary | ICD-10-CM | POA: Diagnosis not present

## 2015-03-12 DIAGNOSIS — M9953 Intervertebral disc stenosis of neural canal of lumbar region: Secondary | ICD-10-CM | POA: Diagnosis not present

## 2015-03-14 DIAGNOSIS — M9953 Intervertebral disc stenosis of neural canal of lumbar region: Secondary | ICD-10-CM | POA: Diagnosis not present

## 2015-03-14 DIAGNOSIS — K219 Gastro-esophageal reflux disease without esophagitis: Secondary | ICD-10-CM | POA: Diagnosis not present

## 2015-03-14 DIAGNOSIS — Z4789 Encounter for other orthopedic aftercare: Secondary | ICD-10-CM | POA: Diagnosis not present

## 2015-03-14 DIAGNOSIS — M4316 Spondylolisthesis, lumbar region: Secondary | ICD-10-CM | POA: Diagnosis not present

## 2015-03-14 DIAGNOSIS — I10 Essential (primary) hypertension: Secondary | ICD-10-CM | POA: Diagnosis not present

## 2015-03-14 DIAGNOSIS — M1990 Unspecified osteoarthritis, unspecified site: Secondary | ICD-10-CM | POA: Diagnosis not present

## 2015-03-21 DIAGNOSIS — M4316 Spondylolisthesis, lumbar region: Secondary | ICD-10-CM | POA: Diagnosis not present

## 2015-03-22 DIAGNOSIS — M1990 Unspecified osteoarthritis, unspecified site: Secondary | ICD-10-CM | POA: Diagnosis not present

## 2015-03-22 DIAGNOSIS — K219 Gastro-esophageal reflux disease without esophagitis: Secondary | ICD-10-CM | POA: Diagnosis not present

## 2015-03-22 DIAGNOSIS — I10 Essential (primary) hypertension: Secondary | ICD-10-CM | POA: Diagnosis not present

## 2015-03-22 DIAGNOSIS — M4316 Spondylolisthesis, lumbar region: Secondary | ICD-10-CM | POA: Diagnosis not present

## 2015-03-22 DIAGNOSIS — M9953 Intervertebral disc stenosis of neural canal of lumbar region: Secondary | ICD-10-CM | POA: Diagnosis not present

## 2015-03-22 DIAGNOSIS — Z4789 Encounter for other orthopedic aftercare: Secondary | ICD-10-CM | POA: Diagnosis not present

## 2015-03-26 ENCOUNTER — Other Ambulatory Visit: Payer: Self-pay | Admitting: *Deleted

## 2015-03-26 MED ORDER — ALLOPURINOL 100 MG PO TABS: 100.0000 mg | ORAL_TABLET | Freq: Two times a day (BID) | ORAL | Status: DC

## 2015-03-26 NOTE — Telephone Encounter (Signed)
Refill done.  

## 2015-04-04 ENCOUNTER — Other Ambulatory Visit: Payer: Self-pay | Admitting: *Deleted

## 2015-04-04 MED ORDER — GABAPENTIN 100 MG PO CAPS: 100.0000 mg | ORAL_CAPSULE | Freq: Every day | ORAL | Status: DC

## 2015-04-04 NOTE — Telephone Encounter (Signed)
Refill done.  

## 2015-04-19 ENCOUNTER — Telehealth: Payer: Self-pay

## 2015-04-19 ENCOUNTER — Telehealth: Payer: Self-pay | Admitting: Internal Medicine

## 2015-04-19 NOTE — Telephone Encounter (Signed)
Pt wants to sch a wellness visit appt. I don't see the date when pt saw Dr. Gilford Rile I do see orders from Dr. Gilford Rile. Let me know if it's okay to sch pt for a wellness appt. Thank You!

## 2015-04-19 NOTE — Telephone Encounter (Signed)
Denisa,   Can you schedule this?  On your schedule if pertainent? thanks

## 2015-04-19 NOTE — Telephone Encounter (Signed)
Fine to schedule her for a Wellness visit. Needs to be after 9/15.

## 2015-04-19 NOTE — Telephone Encounter (Signed)
AWV scheduled with Health Coach.

## 2015-04-20 DIAGNOSIS — Z9889 Other specified postprocedural states: Secondary | ICD-10-CM | POA: Diagnosis not present

## 2015-05-22 ENCOUNTER — Ambulatory Visit: Payer: Medicare Other

## 2015-05-22 DIAGNOSIS — Z9889 Other specified postprocedural states: Secondary | ICD-10-CM | POA: Diagnosis not present

## 2015-05-23 ENCOUNTER — Ambulatory Visit (INDEPENDENT_AMBULATORY_CARE_PROVIDER_SITE_OTHER): Payer: Medicare Other

## 2015-05-23 VITALS — BP 136/64 | HR 68 | Temp 97.4°F | Resp 14 | Ht 58.5 in | Wt 154.4 lb

## 2015-05-23 DIAGNOSIS — Z0000 Encounter for general adult medical examination without abnormal findings: Secondary | ICD-10-CM

## 2015-05-23 DIAGNOSIS — Z23 Encounter for immunization: Secondary | ICD-10-CM | POA: Diagnosis not present

## 2015-05-23 DIAGNOSIS — E785 Hyperlipidemia, unspecified: Secondary | ICD-10-CM

## 2015-05-23 LAB — LIPID PANEL
Cholesterol: 124 mg/dL (ref 0–200)
HDL: 35.1 mg/dL — AB (ref >39.00)
LDL Cholesterol: 63 mg/dL (ref 0–99)
NonHDL: 88.92
Total CHOL/HDL Ratio: 4
Triglycerides: 132 mg/dL (ref 0.0–149.0)
VLDL: 26.4 mg/dL (ref 0.0–40.0)

## 2015-05-23 NOTE — Progress Notes (Signed)
Subjective:   Robin Alexander is a 81 y.o. female who presents for Medicare Annual (Subsequent) preventive examination.  Review of Systems:  No ROS.  Medicare Wellness Visit. Cardiac Risk Factors include: advanced age (>32men, >11 women)     Objective:    The goal of the wellness visit is to assist the patient how to close the gaps in care and create a preventative care plan for the patient. This was a routine visit for [facility].  Vitals: BP 136/64 mmHg  Pulse 68  Temp(Src) 97.4 F (36.3 C) (Oral)  Resp 14  Ht 4' 10.5" (1.486 m)  Wt 154 lb 6.4 oz (70.035 kg)  BMI 31.72 kg/m2  SpO2 97%  Tobacco History  Smoking status  . Former Smoker -- 0.75 packs/day for 20 years  . Types: Cigarettes  . Quit date: 09/02/1983  Smokeless tobacco  . Never Used     Counseling given: Not Answered   Past Medical History  Diagnosis Date  . HTN (hypertension)   . Arthritis   . Chicken pox   . High cholesterol   . Heart murmur   . Pneumonia     spring 2015  . GERD (gastroesophageal reflux disease)    Past Surgical History  Procedure Laterality Date  . Total hip arthroplasty  2005 & 2006    Bilateral, [place]  . Appendectomy  1950  . Abdominal hysterectomy      in 50's  . Cesarean section      4  . Cataract extraction w/ intraocular lens  implant, bilateral    . Rotator cuff repair      right shoulder   10 YRS  . Anterior lat lumbar fusion Right 02/28/2015    Procedure: ANTERIOR LATERAL LUMBAR FUSION 1 LEVEL;  Surgeon: Phylliss Bob, MD;  Location: Day;  Service: Orthopedics;  Laterality: Right;  Right sided lumbar 3-4 lateral interbody fusion   Family History  Problem Relation Age of Onset  . Arthritis Mother   . Heart disease Mother   . Arthritis Father   . Stroke Brother   . Heart disease Brother   . Cancer Neg Hx    History  Sexual Activity  . Sexual Activity: Yes  . Birth Control/ Protection: Coitus interruptus    Outpatient Encounter Prescriptions as of  05/23/2015  Medication Sig  . allopurinol (ZYLOPRIM) 100 MG tablet Take 1 tablet (100 mg total) by mouth 2 (two) times daily.  Marland Kitchen amLODipine-benazepril (LOTREL) 5-20 MG per capsule Take 1 capsule by mouth  daily  . aspirin EC 81 MG tablet Take 81 mg by mouth daily.    . cholecalciferol (VITAMIN D) 1000 UNITS tablet Take 1,000 Units by mouth daily.    . diphenhydrAMINE (BENADRYL) 25 MG tablet Take 25 mg by mouth daily as needed for allergies.   Marland Kitchen gabapentin (NEURONTIN) 100 MG capsule Take 1 capsule (100 mg total) by mouth at bedtime.  Marland Kitchen glucosamine-chondroitin 500-400 MG tablet Take 1 tablet by mouth daily.  . Multiple Vitamin (MULTIVITAMIN) capsule Take 1 capsule by mouth daily.    Marland Kitchen omeprazole (PRILOSEC) 40 MG capsule Take 1 capsule by mouth  daily  . Sennosides-Docusate Sodium ([facility] STOOL SOFTENER/LAXATIVE PO) Take 1 capsule by mouth daily as needed.   . simvastatin (ZOCOR) 20 MG tablet Take 1 tablet by mouth at  bedtime  . Turmeric 500 MG CAPS Take 1 capsule by mouth daily.  . vitamin C (ASCORBIC ACID) 500 MG tablet Take 500 mg by mouth daily.  No facility-administered encounter medications on file as of 05/23/2015.    Activities of Daily Living In your present state of health, do you have any difficulty performing the following activities: 05/23/2015 02/28/2015  Hearing? N -  Vision? N -  Difficulty concentrating or making decisions? N -  Walking or climbing stairs? Y -  Dressing or bathing? N -  Doing errands, shopping? N N  Preparing Food and eating ? N -  Using the Toilet? N -  In the past six months, have you accidently leaked urine? N -  Do you have problems with loss of bowel control? N -  Managing your Medications? N -  Managing your Finances? N -  Housekeeping or managing your Housekeeping? N -    Patient Care Team: Jackolyn Confer, MD as PCP - General (Internal Medicine)    Assessment:    Bone Density/Risk for Osteoporosis reviewed; taking Calcium as  prescribed.  Taking medications without issues; no barriers identified.    High Dose Influenza vaccine administered.  TDAP administered.  Safety issues reviewed; smoke detectors in the home. Firearms in a locked case in the home. Wears seatbelts when driving or riding with others. No violence in the home.  The patient was oriented x 3; appropriate in dress and manner and no objective failures at ADL's or IADL's.   Ophthalmologist- Followed by [facility], Dr. Enriqueta Shutter. Upcoming appointment 08/2015.   Hearing Screening complete-FAIL highest frequency of 4000Hz .  No complaints of difficulty hearing.  Educational material provided.  End of life planning was discussed; Advanced care planning discussed; plans to return copy of HCPOA and Living Will.  Patient concerns: Back pain sometimes makes it hard to climb stairs.  Recent surgery of ANTERIOR LAT LUMBAR FUSION.  Pt to start physical therapy this month.  Falls prevention educational material provided.   Exercise Activities and Dietary recommendations Current Exercise Habits:: The patient does not participate in regular exercise at present  Goals    . Increase physical activity     Re-start going to the [employer]) with husband 3 times weekly for 30-60 minute sessions.  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       Fall Risk Fall Risk  05/23/2015 05/15/2014 05/04/2013  Falls in the past year? No No No   Depression Screen PHQ 2/9 Scores 05/23/2015 05/15/2014 05/04/2013  PHQ - 2 Score 0 0 0     Cognitive Testing MMSE - Mini Mental State Exam 05/23/2015  Orientation to time 5  Orientation to Place 5  Registration 3  Attention/ Calculation 5  Recall 3  Language- name 2 objects 2  Language- repeat 1  Language- follow 3 step command 3  Language- read & follow direction 1  Write a sentence 1  Copy design 1  Total score 30    Immunization History   Administered Date(s) Administered  . Influenza Split 07/31/2011, 06/29/2012  . Influenza, High Dose Seasonal PF 05/23/2015  . Influenza,inj,Quad PF,36+ Mos 05/04/2013, 05/15/2014  . Pneumococcal Conjugate-13 11/03/2013  . Pneumococcal Polysaccharide-23 09/01/2009  . Tdap 05/23/2015  . Zoster 07/30/2010   Screening Tests Health Maintenance  Topic Date Due  . INFLUENZA VACCINE  04/01/2016  . COLONOSCOPY  09/01/2018  . TETANUS/TDAP  05/22/2025  . DEXA SCAN  Completed  . ZOSTAVAX  Completed  . PNA vac Low Risk Adult  Completed      Plan:    During the course  of the visit the patient was educated and counseled about the following appropriate screening and preventive services:   Vaccines to include Pneumoccal, Influenza, Hepatitis B, Td, Zostavax, HCV  Electrocardiogram  Cardiovascular Disease  Colorectal cancer screening  Bone density screening  Diabetes screening  Glaucoma screening  Mammogram  Nutrition counseling   Patient Instructions (the written plan) was given to the patient.   Varney Biles, LPN  [id number]

## 2015-05-23 NOTE — Progress Notes (Signed)
Annual Wellness Visit as completed by Health Coach was reviewed in full.  

## 2015-05-23 NOTE — Patient Instructions (Addendum)
Robin Alexander,  Thank you for taking time to come for your Medicare Wellness Visit.  I appreciate your ongoing commitment to your health goals. Please review the following plan we discussed and let me know if I can assist you in the future.  Please bring a copy of HCPOA to be scanned into your record.   Fat and Cholesterol Control Diet Fat and cholesterol levels in your blood and organs are influenced by your diet. High levels of fat and cholesterol may lead to diseases of the heart, small and large blood vessels, gallbladder, liver, and pancreas. CONTROLLING FAT AND CHOLESTEROL WITH DIET Although exercise and lifestyle factors are important, your diet is key. That is because certain foods are known to raise cholesterol and others to lower it. The goal is to balance foods for their effect on cholesterol and more importantly, to replace saturated and trans fat with other types of fat, such as monounsaturated fat, polyunsaturated fat, and omega-3 fatty acids. On average, a person should consume no more than 15 to 17 g of saturated fat daily. Saturated and trans fats are considered "bad" fats, and they will raise LDL cholesterol. Saturated fats are primarily found in animal products such as meats, butter, and cream. However, that does not mean you need to give up all your favorite foods. Today, there are good tasting, low-fat, low-cholesterol substitutes for most of the things you like to eat. Choose low-fat or nonfat alternatives. Choose round or loin cuts of red meat. These types of cuts are lowest in fat and cholesterol. Chicken (without the skin), fish, veal, and ground [place] breast are great choices. Eliminate fatty meats, such as hot dogs and salami. Even shellfish have little or no saturated fat. Have a 3 oz (85 g) portion when you eat lean meat, poultry, or fish. Trans fats are also called "partially hydrogenated oils." They are oils that have been scientifically manipulated so that they are solid  at room temperature resulting in a longer shelf life and improved taste and texture of foods in which they are added. Trans fats are found in stick margarine, some tub margarines, cookies, crackers, and baked goods.  When baking and cooking, oils are a great substitute for butter. The monounsaturated oils are especially beneficial since it is believed they lower LDL and raise HDL. The oils you should avoid entirely are saturated tropical oils, such as coconut and palm.  Remember to eat a lot from food groups that are naturally free of saturated and trans fat, including fish, fruit, vegetables, beans, grains (barley, rice, couscous, bulgur wheat), and pasta (without cream sauces).  IDENTIFYING FOODS THAT LOWER FAT AND CHOLESTEROL  Soluble fiber may lower your cholesterol. This type of fiber is found in fruits such as apples, vegetables such as broccoli, potatoes, and carrots, legumes such as beans, peas, and lentils, and grains such as barley. Foods fortified with plant sterols (phytosterol) may also lower cholesterol. You should eat at least 2 g per day of these foods for a cholesterol lowering effect.  Read package labels to identify low-saturated fats, trans fat free, and low-fat foods at the supermarket. Select cheeses that have only 2 to 3 g saturated fat per ounce. Use a heart-healthy tub margarine that is free of trans fats or partially hydrogenated oil. When buying baked goods (cookies, crackers), avoid partially hydrogenated oils. Breads and muffins should be made from whole grains (whole-wheat or whole oat flour, instead of "flour" or "enriched flour"). Buy non-creamy canned soups with reduced salt  and no added fats.  FOOD PREPARATION TECHNIQUES  Never deep-fry. If you must fry, either stir-fry, which uses very little fat, or use non-stick cooking sprays. When possible, broil, bake, or roast meats, and steam vegetables. Instead of putting butter or margarine on vegetables, use lemon and herbs,  applesauce, and cinnamon (for squash and sweet potatoes). Use nonfat yogurt, salsa, and low-fat dressings for salads.  LOW-SATURATED FAT / LOW-FAT FOOD SUBSTITUTES Meats / Saturated Fat (g)  Avoid: Steak, marbled (3 oz/85 g) / 11 g  Choose: Steak, lean (3 oz/85 g) / 4 g  Avoid: Hamburger (3 oz/85 g) / 7 g  Choose: Hamburger, lean (3 oz/85 g) / 5 g  Avoid: Ham (3 oz/85 g) / 6 g  Choose: Ham, lean cut (3 oz/85 g) / 2.4 g  Avoid: Chicken, with skin, dark meat (3 oz/85 g) / 4 g  Choose: Chicken, skin removed, dark meat (3 oz/85 g) / 2 g  Avoid: Chicken, with skin, light meat (3 oz/85 g) / 2.5 g  Choose: Chicken, skin removed, light meat (3 oz/85 g) / 1 g Dairy / Saturated Fat (g)  Avoid: Whole milk (1 cup) / 5 g  Choose: Low-fat milk, 2% (1 cup) / 3 g  Choose: Low-fat milk, 1% (1 cup) / 1.5 g  Choose: Skim milk (1 cup) / 0.3 g  Avoid: Hard cheese (1 oz/28 g) / 6 g  Choose: Skim milk cheese (1 oz/28 g) / 2 to 3 g  Avoid: Cottage cheese, 4% fat (1 cup) / 6.5 g  Choose: Low-fat cottage cheese, 1% fat (1 cup) / 1.5 g  Avoid: Ice cream (1 cup) / 9 g  Choose: Sherbet (1 cup) / 2.5 g  Choose: Nonfat frozen yogurt (1 cup) / 0.3 g  Choose: Frozen fruit bar / trace  Avoid: Whipped cream (1 tbs) / 3.5 g  Choose: Nondairy whipped topping (1 tbs) / 1 g Condiments / Saturated Fat (g)  Avoid: Mayonnaise (1 tbs) / 2 g  Choose: Low-fat mayonnaise (1 tbs) / 1 g  Avoid: Butter (1 tbs) / 7 g  Choose: Extra light margarine (1 tbs) / 1 g  Avoid: Coconut oil (1 tbs) / 11.8 g  Choose: Olive oil (1 tbs) / 1.8 g  Choose: Corn oil (1 tbs) / 1.7 g  Choose: Safflower oil (1 tbs) / 1.2 g  Choose: Sunflower oil (1 tbs) / 1.4 g  Choose: Soybean oil (1 tbs) / 2.4 g  Choose: Canola oil (1 tbs) / 1 g Document Released: 08/18/2005 Document Revised: 12/13/2012 Document Reviewed: 11/16/2013 [employer] Patient Information 2015 [place], [place]. This information is not intended to  replace advice given to you by your health care provider. Make sure you discuss any questions you have with your health care provider.  Fall Prevention and Home Safety Falls cause injuries and can affect all age groups. It is possible to prevent falls.  HOW TO PREVENT FALLS  Wear shoes with rubber soles that do not have an opening for your toes.  Keep the inside and outside of your house well lit.  Use night lights throughout your home.  Remove clutter from floors.  Clean up floor spills.  Remove throw rugs or fasten them to the floor with carpet tape.  Do not place electrical cords across pathways.  Put grab bars by your tub, shower, and toilet. Do not use towel bars as grab bars.  Put handrails on both sides of the stairway. Fix loose handrails.  Do not climb on stools or stepladders, if possible.  Do not wax your floors.  Repair uneven or unsafe sidewalks, walkways, or stairs.  Keep items you use a lot within reach.  Be aware of pets.  Keep emergency numbers next to the telephone.  Put smoke detectors in your home and near bedrooms. Ask your doctor what other things you can do to prevent falls. Document Released: 06/14/2009 Document Revised: 02/17/2012 Document Reviewed: 11/18/2011 [facility] Patient Information 2015 [place], [place]. This information is not intended to replace advice given to you by your health care provider. Make sure you discuss any questions you have with your health care provider.  Hearing Loss A hearing loss is sometimes called deafness. Hearing loss may be partial or total. CAUSES Hearing loss may be caused by:  Wax in the ear canal.  Infection of the ear canal.  Infection of the middle ear.  Trauma to the ear or surrounding area.  Fluid in the middle ear.  A hole in the eardrum (perforated eardrum).  Exposure to loud sounds or music.  Problems with the hearing nerve.  Certain medications. Hearing loss without wax, infection, or a  history of injury may mean that the nerve is involved. Hearing loss with severe dizziness, nausea and vomiting or ringing in the ear may suggest a hearing nerve irritation or problems in the middle or inner ear. If hearing loss is untreated, there is a greater likelihood for residual or permanent hearing loss. DIAGNOSIS A hearing test (audiometry) assesses hearing loss. The audiometry test needs to be performed by a hearing specialist (audiologist). TREATMENT Treatment for recent onset of hearing loss may include:  Ear wax removal.  Medications that kill germs (antibiotics).  Cortisone medications.  Prompt follow up with the appropriate specialist. Return of hearing depends on the cause of your hearing loss, so proper medical follow-up is important. Some hearing loss may not be reversible, and a caregiver should discuss care and treatment options with you. SEEK MEDICAL CARE IF:   You have a severe headache, dizziness, or changes in vision.  You have new or increased weakness.  You develop repeated vomiting or other serious medical problems.  You have a fever. Document Released: 08/18/2005 Document Revised: 11/10/2011 Document Reviewed: 12/13/2009 [facility] Patient Information 2015 [place], [place]. This information is not intended to replace advice given to you by your health care provider. Make sure you discuss any questions you have with your health care provider.

## 2015-06-11 DIAGNOSIS — M545 Low back pain: Secondary | ICD-10-CM | POA: Diagnosis not present

## 2015-06-14 DIAGNOSIS — M545 Low back pain: Secondary | ICD-10-CM | POA: Diagnosis not present

## 2015-06-18 DIAGNOSIS — M545 Low back pain: Secondary | ICD-10-CM | POA: Diagnosis not present

## 2015-06-21 ENCOUNTER — Ambulatory Visit: Payer: Medicare Other | Admitting: Cardiovascular Disease

## 2015-06-29 DIAGNOSIS — M545 Low back pain: Secondary | ICD-10-CM | POA: Diagnosis not present

## 2015-07-02 DIAGNOSIS — M545 Low back pain: Secondary | ICD-10-CM | POA: Diagnosis not present

## 2015-07-12 ENCOUNTER — Other Ambulatory Visit: Payer: Self-pay

## 2015-07-12 MED ORDER — OMEPRAZOLE 40 MG PO CPDR: 40.0000 mg | DELAYED_RELEASE_CAPSULE | Freq: Every day | ORAL | Status: DC

## 2015-07-12 MED ORDER — SIMVASTATIN 20 MG PO TABS: 20.0000 mg | ORAL_TABLET | Freq: Every morning | ORAL | Status: DC

## 2015-07-16 ENCOUNTER — Telehealth: Payer: Self-pay | Admitting: Internal Medicine

## 2015-07-16 NOTE — Telephone Encounter (Signed)
Fine to call in 30 day supply to local pharmacy

## 2015-07-16 NOTE — Telephone Encounter (Signed)
Rx called in as directed.   

## 2015-07-16 NOTE — Telephone Encounter (Signed)
Please advise 

## 2015-07-16 NOTE — Telephone Encounter (Signed)
The patient is completely out of her simvastatin (ZOCOR) 20 MG tablet. She is aware that a prescription has been sent to her mail order, but she would like some called to [facility] in [place] until she receives her mail order .

## 2015-07-19 ENCOUNTER — Ambulatory Visit (INDEPENDENT_AMBULATORY_CARE_PROVIDER_SITE_OTHER): Payer: Medicare Other | Admitting: Cardiovascular Disease

## 2015-07-19 ENCOUNTER — Encounter: Payer: Self-pay | Admitting: Cardiovascular Disease

## 2015-07-19 VITALS — BP 128/56 | HR 79 | Ht <= 58 in | Wt 148.0 lb

## 2015-07-19 DIAGNOSIS — I10 Essential (primary) hypertension: Secondary | ICD-10-CM | POA: Diagnosis not present

## 2015-07-19 DIAGNOSIS — I4510 Unspecified right bundle-branch block: Secondary | ICD-10-CM | POA: Diagnosis not present

## 2015-07-19 DIAGNOSIS — E785 Hyperlipidemia, unspecified: Secondary | ICD-10-CM | POA: Diagnosis not present

## 2015-07-19 DIAGNOSIS — I350 Nonrheumatic aortic (valve) stenosis: Secondary | ICD-10-CM

## 2015-07-19 NOTE — Assessment & Plan Note (Signed)
Blood pressure is well controlled on current medications. 

## 2015-07-19 NOTE — Assessment & Plan Note (Signed)
Lab Results  Component Value Date   CHOL 124 05/23/2015   HDL 35.10* 05/23/2015   LDLCALC 63 05/23/2015   TRIG 132.0 05/23/2015   CHOLHDL 4 05/23/2015   lipid profile was optimal on current dose of simvastatin.

## 2015-07-19 NOTE — Patient Instructions (Addendum)
Medication Instructions:  Your physician recommends that you continue on your current medications as directed. Please refer to the Current Medication list given to you today.   Labwork: none  Testing/Procedures: Your physician has requested that you have an echocardiogram. Echocardiography is a painless test that uses sound waves to create images of your heart. It provides your doctor with information about the size and shape of your heart and how well your heart's chambers and valves are working. This procedure takes approximately one hour. There are no restrictions for this procedure.    Follow-Up: Your physician wants you to follow-up in: one year with Dr. Arida.  You will receive a reminder letter in the mail two months in advance. If you don't receive a letter, please call our office to schedule the follow-up appointment.   Any Other Special Instructions Will Be Listed Below (If Applicable).     If you need a refill on your cardiac medications before your next appointment, please call your pharmacy.  Echocardiogram An echocardiogram, or echocardiography, uses sound waves (ultrasound) to produce an image of your heart. The echocardiogram is simple, painless, obtained within a short period of time, and offers valuable information to your health care provider. The images from an echocardiogram can provide information such as:  Evidence of coronary artery disease (CAD).  Heart size.  Heart muscle function.  Heart valve function.  Aneurysm detection.  Evidence of a past heart attack.  Fluid buildup around the heart.  Heart muscle thickening.  Assess heart valve function. LET YOUR HEALTH CARE PROVIDER KNOW ABOUT:  Any allergies you have.  All medicines you are taking, including vitamins, herbs, eye drops, creams, and over-the-counter medicines.  Previous problems you or members of your family have had with the use of anesthetics.  Any blood disorders you  have.  Previous surgeries you have had.  Medical conditions you have.  Possibility of pregnancy, if this applies. BEFORE THE PROCEDURE  No special preparation is needed. Eat and drink normally.  PROCEDURE   In order to produce an image of your heart, gel will be applied to your chest and a wand-like tool (transducer) will be moved over your chest. The gel will help transmit the sound waves from the transducer. The sound waves will harmlessly bounce off your heart to allow the heart images to be captured in real-time motion. These images will then be recorded.  You may need an IV to receive a medicine that improves the quality of the pictures. AFTER THE PROCEDURE You may return to your normal schedule including diet, activities, and medicines, unless your health care provider tells you otherwise.   This information is not intended to replace advice given to you by your health care provider. Make sure you discuss any questions you have with your health care provider.   Document Released: 08/15/2000 Document Revised: 09/08/2014 Document Reviewed: 04/25/2013 Else[employer] Interactive Patient Education 2016 [employer]  

## 2015-07-19 NOTE — Progress Notes (Signed)
HPI  This is a pleasant 81 year old female who is here today for a follow up visit regarding mild aortic stenosis.  She has known history of hypertension, hyperlipidemia and gastroesophageal reflux disease. She has right bundle branch block on her EKG.  Echo in 06/2013 showed: Normal LV systolic function, mild LVH, mild aortic stenosis and regurgitation, mild mitral regurgitation and mildly dilated left atrium . mean aortic valve gradient was 13 mmHg   treadmill stress test in October of 2014 showed no evidence of ischemia. She has been stable from a cardiac standpoint and denies any chest pain or worsening dyspnea. She underwent back surgery the summertime with no complications.   Allergies  Allergen Reactions  . Latex     hives  . Codeine Itching     Current Outpatient Prescriptions on File Prior to Visit  Medication Sig Dispense Refill  . allopurinol (ZYLOPRIM) 100 MG tablet Take 1 tablet (100 mg total) by mouth 2 (two) times daily. 180 tablet 1  . amLODipine-benazepril (LOTREL) 5-20 MG per capsule Take 1 capsule by mouth  daily 90 capsule 3  . aspirin EC 81 MG tablet Take 81 mg by mouth daily.      . cholecalciferol (VITAMIN D) 1000 UNITS tablet Take 1,000 Units by mouth daily.      . diphenhydrAMINE (BENADRYL) 25 MG tablet Take 25 mg by mouth daily as needed for allergies.     Marland Kitchen glucosamine-chondroitin 500-400 MG tablet Take 1 tablet by mouth daily.    . Multiple Vitamin (MULTIVITAMIN) capsule Take 1 capsule by mouth daily.      Marland Kitchen omeprazole (PRILOSEC) 40 MG capsule Take 1 capsule (40 mg total) by mouth daily. 90 capsule 2  . Sennosides-Docusate Sodium ([employer] STOOL SOFTENER/LAXATIVE PO) Take 1 capsule by mouth daily as needed.     . simvastatin (ZOCOR) 20 MG tablet Take 1 tablet (20 mg total) by mouth every morning. 90 tablet 1  . Turmeric 500 MG CAPS Take 1 capsule by mouth daily.    . vitamin C (ASCORBIC ACID) 500 MG tablet Take 500 mg by mouth daily.       No current  facility-administered medications on file prior to visit.     Past Medical History  Diagnosis Date  . HTN (hypertension)   . Arthritis   . Chicken pox   . High cholesterol   . Heart murmur   . Pneumonia     spring 2015  . GERD (gastroesophageal reflux disease)      Past Surgical History  Procedure Laterality Date  . Total hip arthroplasty  2005 & 2006    Bilateral, [place]  . Appendectomy  1950  . Abdominal hysterectomy      in 50's  . Cesarean section      2  . Cataract extraction w/ intraocular lens  implant, bilateral    . Rotator cuff repair      right shoulder   10 YRS  . Anterior lat lumbar fusion Right 02/28/2015    Procedure: ANTERIOR LATERAL LUMBAR FUSION 1 LEVEL;  Surgeon: Phylliss Bob, MD;  Location: [facility];  Service: Orthopedics;  Laterality: Right;  Right sided lumbar 3-4 lateral interbody fusion     Family History  Problem Relation Age of Onset  . Arthritis Mother   . Heart disease Mother   . Arthritis Father   . Stroke Brother   . Heart disease Brother   . Cancer Neg Hx      Social History  Social History  . Marital Status: Married    Spouse Name: N/A  . Number of Children: 2  . Years of Education: N/A   Occupational History  . Retired - Press photographer    Social History Main Topics  . Smoking status: Former Smoker -- 0.75 packs/day for 20 years    Types: Cigarettes    Quit date: 09/02/1983  . Smokeless tobacco: Never Used  . Alcohol Use: No     Comment: Occasional wine  . Drug Use: No  . Sexual Activity: Yes    Birth Control/ Protection: Coitus interruptus   Other Topics Concern  . Not on file   Social History Narrative   Regular Exercise -  Active but no regular exercise routine   Daily Caffeine Use:  NO           ROS A 10 point  review of system was performed. It is negative other than what is mentioned in the history of present illness.  PHYSICAL EXAM   BP 128/56 mmHg  Pulse 79  Ht 4\' 9"  (1.448 m)  Wt 148 lb  (67.132 kg)  BMI 32.02 kg/m2 Constitutional: She is oriented to person, place, and time. She appears well-developed and well-nourished. No distress.  HENT: No nasal discharge.  Head: Normocephalic and atraumatic.  Eyes: Pupils are equal and round. Right eye exhibits no discharge. Left eye exhibits no discharge.  Neck: Normal range of motion. Neck supple. No JVD present. No thyromegaly present.  Cardiovascular: Normal rate, regular rhythm, normal heart sounds. Exam reveals no gallop and no friction rub. There is a 2/6 systolic crescendo decrescendo murmur at the base of the heart which is mid peaking with preserved S2. Pulmonary/Chest: Effort normal and breath sounds normal. No stridor. No respiratory distress. She has no wheezes. She has no rales. She exhibits no tenderness.  Abdominal: Soft. Bowel sounds are normal. She exhibits no distension. There is no tenderness. There is no rebound and no guarding.  Musculoskeletal: Normal range of motion. She exhibits no edema and no tenderness.  Neurological: She is alert and oriented to person, place, and time. Coordination normal.  Skin: Skin is warm and dry. No rash noted. She is not diaphoretic. No erythema. No pallor.  Psychiatric: She has a normal mood and affect. Her behavior is normal. Judgment and thought content normal.     EKG: Normal sinus rhythm with a right bundle branch block.   ASSESSMENT AND PLAN

## 2015-07-19 NOTE — Assessment & Plan Note (Signed)
She appears to be stable from a cardiac standpoint. Physical exam is suggestive of moderate aortic stenosis . Most recent echocardiogram was more than 2 years ago. I requested a follow-up echo.

## 2015-07-20 ENCOUNTER — Encounter: Payer: Self-pay | Admitting: Internal Medicine

## 2015-07-20 ENCOUNTER — Ambulatory Visit (INDEPENDENT_AMBULATORY_CARE_PROVIDER_SITE_OTHER): Payer: Medicare Other | Admitting: Internal Medicine

## 2015-07-20 VITALS — BP 119/55 | HR 71 | Temp 97.8°F | Ht 58.5 in | Wt 152.1 lb

## 2015-07-20 DIAGNOSIS — I10 Essential (primary) hypertension: Secondary | ICD-10-CM

## 2015-07-20 DIAGNOSIS — E785 Hyperlipidemia, unspecified: Secondary | ICD-10-CM

## 2015-07-20 DIAGNOSIS — M5416 Radiculopathy, lumbar region: Secondary | ICD-10-CM | POA: Diagnosis not present

## 2015-07-20 DIAGNOSIS — I350 Nonrheumatic aortic (valve) stenosis: Secondary | ICD-10-CM

## 2015-07-20 DIAGNOSIS — N3000 Acute cystitis without hematuria: Secondary | ICD-10-CM | POA: Diagnosis not present

## 2015-07-20 LAB — POCT URINALYSIS DIPSTICK
Bilirubin, UA: NEGATIVE
Glucose, UA: NEGATIVE
Nitrite, UA: POSITIVE
Protein, UA: 30
Spec Grav, UA: 1.02
Urobilinogen, UA: 0.2
pH, UA: 5.5

## 2015-07-20 LAB — COMPREHENSIVE METABOLIC PANEL
ALK PHOS: 89 U/L (ref 39–117)
ALT: 13 U/L (ref 0–35)
AST: 18 U/L (ref 0–37)
Albumin: 4.5 g/dL (ref 3.5–5.2)
BILIRUBIN TOTAL: 0.4 mg/dL (ref 0.2–1.2)
BUN: 33 mg/dL — ABNORMAL HIGH (ref 6–23)
CALCIUM: 9.6 mg/dL (ref 8.4–10.5)
CO2: 30 mEq/L (ref 19–32)
Chloride: 100 mEq/L (ref 96–112)
Creatinine, Ser: 1.23 mg/dL — ABNORMAL HIGH (ref 0.40–1.20)
GFR: 44.58 mL/min — ABNORMAL LOW (ref >60.00)
GLUCOSE: 77 mg/dL (ref 70–99)
Potassium: 4.6 mEq/L (ref 3.5–5.1)
Sodium: 139 mEq/L (ref 135–145)
TOTAL PROTEIN: 7 g/dL (ref 6.0–8.3)

## 2015-07-20 MED ORDER — CIPROFLOXACIN HCL 500 MG PO TABS: 500.0000 mg | ORAL_TABLET | Freq: Two times a day (BID) | ORAL | Status: DC

## 2015-07-20 NOTE — Assessment & Plan Note (Signed)
Symptoms c/w UTI. Will start Cipro. Send urine for culture. Follow up if symptoms are not improving.

## 2015-07-20 NOTE — Assessment & Plan Note (Signed)
Reviewed notes from Cardiology. Follow up ECHO pending.

## 2015-07-20 NOTE — Patient Instructions (Signed)
Start Cipro for urinary tract infection.  Labs today.

## 2015-07-20 NOTE — Progress Notes (Signed)
Subjective:    Patient ID: Robin Alexander, female    DOB: 07/26/1933, 81 y.o.   MRN: [id number]  HPI  81YO female presents for follow up.  Concerned about possible kidney infection. Notes some urinary frequency and burning with urination x 2 weeks. No blood in urine. Not taking anything for this.  Had back surgery in June with lumbar fusion. Pain has completely resolved.  Compliant with medications. No CP, dyspnea, palpitations. Recently seen by cardiology and scheduled for cardiac ECHO to follow up Aortic Stenosis.  Wt Readings from Last 3 Encounters:  07/20/15 152 lb 2 oz (69.003 kg)  07/19/15 148 lb (67.132 kg)  05/23/15 154 lb 6.4 oz (70.035 kg)   BP Readings from Last 3 Encounters:  07/20/15 119/55  07/19/15 128/56  05/23/15 136/64    Past Medical History  Diagnosis Date  . HTN (hypertension)   . Arthritis   . Chicken pox   . High cholesterol   . Heart murmur   . Pneumonia     spring 2015  . GERD (gastroesophageal reflux disease)    Family History  Problem Relation Age of Onset  . Arthritis Mother   . Heart disease Mother   . Arthritis Father   . Stroke Brother   . Heart disease Brother   . Cancer Neg Hx    Past Surgical History  Procedure Laterality Date  . Total hip arthroplasty  2005 & 2006    Bilateral, [place]  . Appendectomy  1950  . Abdominal hysterectomy      in 50's  . Cesarean section      2  . Cataract extraction w/ intraocular lens  implant, bilateral    . Rotator cuff repair      right shoulder   10 YRS  . Anterior lat lumbar fusion Right 02/28/2015    Procedure: ANTERIOR LATERAL LUMBAR FUSION 1 LEVEL;  Surgeon: Phylliss Bob, MD;  Location: [place];  Service: Orthopedics;  Laterality: Right;  Right sided lumbar 3-4 lateral interbody fusion   Social History   Social History  . Marital Status: Married    Spouse Name: N/A  . Number of Children: 2  . Years of Education: N/A   Occupational History  . Retired - Press photographer     Social History Main Topics  . Smoking status: Former Smoker -- 0.75 packs/day for 20 years    Types: Cigarettes    Quit date: 09/02/1983  . Smokeless tobacco: Never Used  . Alcohol Use: No     Comment: Occasional wine  . Drug Use: No  . Sexual Activity: Yes    Birth Control/ Protection: Coitus interruptus   Other Topics Concern  . None   Social History Narrative   Regular Exercise -  Active but no regular exercise routine   Daily Caffeine Use:  NO          Review of Systems  Constitutional: Negative for fever, chills, appetite change, fatigue and unexpected weight change.  Eyes: Negative for visual disturbance.  Respiratory: Negative for shortness of breath.   Cardiovascular: Negative for chest pain and leg swelling.  Gastrointestinal: Negative for nausea, vomiting, abdominal pain, diarrhea and constipation.  Genitourinary: Positive for dysuria, urgency and frequency. Negative for hematuria, flank pain and decreased urine volume.  Musculoskeletal: Negative for myalgias and arthralgias.  Skin: Negative for color change and rash.  Hematological: Negative for adenopathy. Does not bruise/bleed easily.  Psychiatric/Behavioral: Negative for sleep disturbance and dysphoric mood. The patient is not  nervous/anxious.        Objective:    BP 119/55 mmHg  Pulse 71  Temp(Src) 97.8 F (36.6 C) (Oral)  Ht 4' 10.5" (1.486 m)  Wt 152 lb 2 oz (69.003 kg)  BMI 31.25 kg/m2  SpO2 97% Physical Exam  Constitutional: She is oriented to person, place, and time. She appears well-developed and well-nourished. No distress.  HENT:  Head: Normocephalic and atraumatic.  Right Ear: External ear normal.  Left Ear: External ear normal.  Nose: Nose normal.  Mouth/Throat: Oropharynx is clear and moist. No oropharyngeal exudate.  Eyes: Conjunctivae are normal. Pupils are equal, round, and reactive to light. Right eye exhibits no discharge. Left eye exhibits no discharge. No scleral icterus.   Neck: Normal range of motion. Neck supple. No tracheal deviation present. No thyromegaly present.  Cardiovascular: Normal rate, regular rhythm and intact distal pulses.  Exam reveals no gallop and no friction rub.   Murmur heard. Pulmonary/Chest: Effort normal and breath sounds normal. No respiratory distress. She has no wheezes. She has no rales. She exhibits no tenderness.  Musculoskeletal: Normal range of motion. She exhibits no edema or tenderness.  Lymphadenopathy:    She has no cervical adenopathy.  Neurological: She is alert and oriented to person, place, and time. No cranial nerve deficit. She exhibits normal muscle tone. Coordination normal.  Skin: Skin is warm and dry. No rash noted. She is not diaphoretic. No erythema. No pallor.  Psychiatric: She has a normal mood and affect. Her behavior is normal. Judgment and thought content normal.          Assessment & Plan:   Problem List Items Addressed This Visit      Unprioritized   Acute cystitis without hematuria    Symptoms c/w UTI. Will start Cipro. Send urine for culture. Follow up if symptoms are not improving.      Relevant Medications   ciprofloxacin (CIPRO) 500 MG tablet   Other Relevant Orders   POCT Urinalysis Dipstick (Completed)   CULTURE, URINE COMPREHENSIVE   Aortic stenosis, mild    Reviewed notes from Cardiology. Follow up ECHO pending.      Hyperlipidemia    Recent lipids well controlled. Continue Simvastatin.      Hypertension - Primary    BP Readings from Last 3 Encounters:  07/20/15 119/55  07/19/15 128/56  05/23/15 136/64   BP well controlled. Renal function with labs. Continue current medication.      Relevant Orders   Comprehensive metabolic panel   Lumbar radiculopathy, chronic    Symptoms of pain much improved after lumbar fusion. Reviewed operative notes. Follow up prn.          Return in about 6 months (around 01/17/2016) for Recheck.

## 2015-07-20 NOTE — Assessment & Plan Note (Signed)
Symptoms of pain much improved after lumbar fusion. Reviewed operative notes. Follow up prn.

## 2015-07-20 NOTE — Assessment & Plan Note (Signed)
Recent lipids well controlled. Continue Simvastatin.

## 2015-07-20 NOTE — Progress Notes (Signed)
Pre visit review using our clinic review tool, if applicable. No additional management support is needed unless otherwise documented below in the visit note. 

## 2015-07-20 NOTE — Assessment & Plan Note (Signed)
BP Readings from Last 3 Encounters:  07/20/15 119/55  07/19/15 128/56  05/23/15 136/64   BP well controlled. Renal function with labs. Continue current medication.

## 2015-07-23 LAB — CULTURE, URINE COMPREHENSIVE

## 2015-08-10 ENCOUNTER — Ambulatory Visit (INDEPENDENT_AMBULATORY_CARE_PROVIDER_SITE_OTHER): Payer: Medicare Other

## 2015-08-10 ENCOUNTER — Other Ambulatory Visit: Payer: Self-pay

## 2015-08-10 DIAGNOSIS — I350 Nonrheumatic aortic (valve) stenosis: Secondary | ICD-10-CM | POA: Diagnosis not present

## 2015-08-13 ENCOUNTER — Other Ambulatory Visit: Payer: Self-pay

## 2015-08-13 ENCOUNTER — Other Ambulatory Visit: Payer: Self-pay | Admitting: Family Medicine

## 2015-08-13 DIAGNOSIS — I350 Nonrheumatic aortic (valve) stenosis: Secondary | ICD-10-CM

## 2015-08-13 NOTE — Telephone Encounter (Signed)
Refill done.  

## 2015-09-12 ENCOUNTER — Other Ambulatory Visit: Payer: Self-pay | Admitting: Family Medicine

## 2015-09-12 NOTE — Telephone Encounter (Signed)
Refill done.  

## 2015-09-21 DIAGNOSIS — Z961 Presence of intraocular lens: Secondary | ICD-10-CM | POA: Diagnosis not present

## 2015-11-01 DIAGNOSIS — D229 Melanocytic nevi, unspecified: Secondary | ICD-10-CM | POA: Diagnosis not present

## 2015-11-01 DIAGNOSIS — L814 Other melanin hyperpigmentation: Secondary | ICD-10-CM | POA: Diagnosis not present

## 2015-11-01 DIAGNOSIS — Z1283 Encounter for screening for malignant neoplasm of skin: Secondary | ICD-10-CM | POA: Diagnosis not present

## 2015-11-01 DIAGNOSIS — L821 Other seborrheic keratosis: Secondary | ICD-10-CM | POA: Diagnosis not present

## 2015-11-20 ENCOUNTER — Other Ambulatory Visit: Payer: Self-pay | Admitting: Cardiovascular Disease

## 2015-11-20 ENCOUNTER — Other Ambulatory Visit: Payer: Self-pay | Admitting: Internal Medicine

## 2015-12-04 DIAGNOSIS — M25552 Pain in left hip: Secondary | ICD-10-CM | POA: Diagnosis not present

## 2015-12-17 ENCOUNTER — Other Ambulatory Visit: Payer: Self-pay | Admitting: Internal Medicine

## 2015-12-19 ENCOUNTER — Ambulatory Visit
Admission: RE | Admit: 2015-12-19 | Discharge: 2015-12-19 | Disposition: A | Discharge status: Home / Self Care | Payer: Medicare Other | Source: Ambulatory Visit | Attending: Orthopedic Surgery | Admitting: Orthopedic Surgery

## 2015-12-19 ENCOUNTER — Other Ambulatory Visit: Payer: Self-pay | Admitting: Orthopedic Surgery

## 2015-12-19 DIAGNOSIS — M25552 Pain in left hip: Secondary | ICD-10-CM | POA: Diagnosis not present

## 2015-12-19 DIAGNOSIS — Z96653 Presence of artificial knee joint, bilateral: Secondary | ICD-10-CM | POA: Diagnosis not present

## 2016-01-17 ENCOUNTER — Ambulatory Visit (INDEPENDENT_AMBULATORY_CARE_PROVIDER_SITE_OTHER): Payer: Medicare Other | Admitting: Internal Medicine

## 2016-01-17 ENCOUNTER — Encounter: Payer: Self-pay | Admitting: Internal Medicine

## 2016-01-17 VITALS — BP 130/64 | HR 74 | Temp 98.3°F | Wt 149.4 lb

## 2016-01-17 DIAGNOSIS — I10 Essential (primary) hypertension: Secondary | ICD-10-CM

## 2016-01-17 DIAGNOSIS — E785 Hyperlipidemia, unspecified: Secondary | ICD-10-CM | POA: Diagnosis not present

## 2016-01-17 DIAGNOSIS — K219 Gastro-esophageal reflux disease without esophagitis: Secondary | ICD-10-CM

## 2016-01-17 LAB — COMPREHENSIVE METABOLIC PANEL
ALT: 17 U/L (ref 0–35)
AST: 22 U/L (ref 0–37)
Albumin: 4.4 g/dL (ref 3.5–5.2)
Alkaline Phosphatase: 79 U/L (ref 39–117)
BILIRUBIN TOTAL: 0.5 mg/dL (ref 0.2–1.2)
BUN: 30 mg/dL — ABNORMAL HIGH (ref 6–23)
CO2: 28 mEq/L (ref 19–32)
Calcium: 9.8 mg/dL (ref 8.4–10.5)
Chloride: 103 mEq/L (ref 96–112)
Creatinine, Ser: 1.25 mg/dL — ABNORMAL HIGH (ref 0.40–1.20)
GFR: 43.7 mL/min — AB (ref >60.00)
Glucose, Bld: 90 mg/dL (ref 70–99)
POTASSIUM: 5 meq/L (ref 3.5–5.1)
Sodium: 138 mEq/L (ref 135–145)
Total Protein: 6.9 g/dL (ref 6.0–8.3)

## 2016-01-17 MED ORDER — PANTOPRAZOLE SODIUM 40 MG PO TBEC: 40.0000 mg | DELAYED_RELEASE_TABLET | Freq: Two times a day (BID) | ORAL | Status: DC

## 2016-01-17 MED ORDER — MELOXICAM 15 MG PO TABS: 15.0000 mg | ORAL_TABLET | Freq: Every day | ORAL | Status: DC

## 2016-01-17 NOTE — Assessment & Plan Note (Signed)
BP Readings from Last 3 Encounters:  01/17/16 130/64  07/20/15 119/55  07/19/15 128/56   BP well controlled. Renal function with labs. Continue current medication.

## 2016-01-17 NOTE — Assessment & Plan Note (Signed)
Will check LFTS with labs. Continue Simvastatin.

## 2016-01-17 NOTE — Patient Instructions (Signed)
Stop Omeprazole.  Start Pantoprazole twice daily.  Please let us know if your symptoms are not improving.  Labs today.

## 2016-01-17 NOTE — Progress Notes (Signed)
Pre visit review using our clinic review tool, if applicable. No additional management support is needed unless otherwise documented below in the visit note. 

## 2016-01-17 NOTE — Progress Notes (Signed)
Subjective:    Patient ID: Robin Alexander, female    DOB: 1933/01/25, 82 y.o.   MRN: [id number]  HPI  82YO female presents for follow up.  Last seen in 07/2015 for hypertension. Compliant with medications. No CP, dyspnea.  Having some worsening symptoms of acid reflux. Worse at night. Feels that fluid burns her throat.  No NV. No change in appetite. No clear food triggers for symptoms.  Wt Readings from Last 3 Encounters:  01/17/16 149 lb 6.4 oz (67.767 kg)  07/20/15 152 lb 2 oz (69.003 kg)  07/19/15 148 lb (67.132 kg)   BP Readings from Last 3 Encounters:  01/17/16 130/64  07/20/15 119/55  07/19/15 128/56    Past Medical History  Diagnosis Date  . HTN (hypertension)   . Arthritis   . Chicken pox   . High cholesterol   . Heart murmur   . Pneumonia     spring 2015  . GERD (gastroesophageal reflux disease)    Family History  Problem Relation Age of Onset  . Arthritis Mother   . Heart disease Mother   . Arthritis Father   . Stroke Brother   . Heart disease Brother   . Cancer Neg Hx    Past Surgical History  Procedure Laterality Date  . Total hip arthroplasty  2005 & 2006    Bilateral, [place]  . Appendectomy  1950  . Abdominal hysterectomy      in 50's  . Cesarean section      2  . Cataract extraction w/ intraocular lens  implant, bilateral    . Rotator cuff repair      right shoulder   10 YRS  . Anterior lat lumbar fusion Right 02/28/2015    Procedure: ANTERIOR LATERAL LUMBAR FUSION 1 LEVEL;  Surgeon: Phylliss Bob, MD;  Location: [place];  Service: Orthopedics;  Laterality: Right;  Right sided lumbar 3-4 lateral interbody fusion   Social History   Social History  . Marital Status: Married    Spouse Name: N/A  . Number of Children: 2  . Years of Education: N/A   Occupational History  . Retired - Press photographer    Social History Main Topics  . Smoking status: Former Smoker -- 0.75 packs/day for 20 years    Types: Cigarettes    Quit date:  09/02/1983  . Smokeless tobacco: Never Used  . Alcohol Use: No     Comment: Occasional wine  . Drug Use: No  . Sexual Activity: Yes    Birth Control/ Protection: Coitus interruptus   Other Topics Concern  . None   Social History Narrative   Regular Exercise -  Active but no regular exercise routine   Daily Caffeine Use:  NO          Review of Systems  Constitutional: Negative for fever, chills, appetite change, fatigue and unexpected weight change.  HENT: Positive for sore throat (with reflux symptoms). Negative for congestion, postnasal drip, rhinorrhea and sinus pressure.   Eyes: Negative for visual disturbance.  Respiratory: Negative for shortness of breath.   Cardiovascular: Negative for chest pain and leg swelling.  Gastrointestinal: Negative for nausea, vomiting, abdominal pain, diarrhea and constipation.  Musculoskeletal: Negative for myalgias and arthralgias.  Skin: Negative for color change and rash.  Hematological: Negative for adenopathy. Does not bruise/bleed easily.  Psychiatric/Behavioral: Negative for suicidal ideas, sleep disturbance and dysphoric mood. The patient is not nervous/anxious.        Objective:    BP 130/64  mmHg  Pulse 74  Temp(Src) 98.3 F (36.8 C) (Oral)  Wt 149 lb 6.4 oz (67.767 kg) Physical Exam  Constitutional: She is oriented to person, place, and time. She appears well-developed and well-nourished. No distress.  HENT:  Head: Normocephalic and atraumatic.  Right Ear: External ear normal.  Left Ear: External ear normal.  Nose: Nose normal.  Mouth/Throat: Oropharynx is clear and moist. No oropharyngeal exudate.  Eyes: Conjunctivae are normal. Pupils are equal, round, and reactive to light. Right eye exhibits no discharge. Left eye exhibits no discharge. No scleral icterus.  Neck: Normal range of motion. Neck supple. No tracheal deviation present. No thyromegaly present.  Cardiovascular: Normal rate, regular rhythm and intact distal  pulses.  Exam reveals no gallop and no friction rub.   Murmur heard.  Systolic murmur is present  Pulmonary/Chest: Effort normal and breath sounds normal. No respiratory distress. She has no wheezes. She has no rales. She exhibits no tenderness.  Musculoskeletal: Normal range of motion. She exhibits no edema or tenderness.  Lymphadenopathy:    She has no cervical adenopathy.  Neurological: She is alert and oriented to person, place, and time. No cranial nerve deficit. She exhibits normal muscle tone. Coordination normal.  Skin: Skin is warm and dry. No rash noted. She is not diaphoretic. No erythema. No pallor.  Psychiatric: She has a normal mood and affect. Her behavior is normal. Judgment and thought content normal.          Assessment & Plan:   Problem List Items Addressed This Visit      Unprioritized   GERD (gastroesophageal reflux disease) - Primary    Worsening reflux symptoms. Will change Omeprazole to Pantoprazole bid.  Follow up recheck in 4 weeks and prn. Discussed referral to GI for endoscopy if symptoms persistent.      Relevant Medications   pantoprazole (PROTONIX) 40 MG tablet   Hyperlipidemia    Will check LFTS with labs. Continue Simvastatin.      Hypertension    BP Readings from Last 3 Encounters:  01/17/16 130/64  07/20/15 119/55  07/19/15 128/56   BP well controlled. Renal function with labs. Continue current medication.      Relevant Orders   Comp Met (CMET)       Return in about 4 weeks (around 02/14/2016) for Physical.  Ronette Deter, MD Internal [facility]

## 2016-01-17 NOTE — Assessment & Plan Note (Signed)
Worsening reflux symptoms. Will change Omeprazole to Pantoprazole bid.  Follow up recheck in 4 weeks and prn. Discussed referral to GI for endoscopy if symptoms persistent.

## 2016-02-04 DIAGNOSIS — M546 Pain in thoracic spine: Secondary | ICD-10-CM | POA: Diagnosis not present

## 2016-02-04 DIAGNOSIS — M545 Low back pain: Secondary | ICD-10-CM | POA: Diagnosis not present

## 2016-02-04 DIAGNOSIS — S32010A Wedge compression fracture of first lumbar vertebra, initial encounter for closed fracture: Secondary | ICD-10-CM | POA: Diagnosis not present

## 2016-03-09 ENCOUNTER — Other Ambulatory Visit: Payer: Self-pay | Admitting: Family Medicine

## 2016-03-10 NOTE — Telephone Encounter (Signed)
Refill done.  Pt needs an appt for future refills.  

## 2016-03-11 ENCOUNTER — Encounter: Payer: Self-pay | Admitting: Internal Medicine

## 2016-03-11 ENCOUNTER — Ambulatory Visit (INDEPENDENT_AMBULATORY_CARE_PROVIDER_SITE_OTHER): Payer: Medicare Other | Admitting: Internal Medicine

## 2016-03-11 VITALS — BP 132/50 | HR 70 | Ht 60.0 in | Wt 152.8 lb

## 2016-03-11 DIAGNOSIS — Z0000 Encounter for general adult medical examination without abnormal findings: Secondary | ICD-10-CM | POA: Diagnosis not present

## 2016-03-11 LAB — COMPREHENSIVE METABOLIC PANEL
ALK PHOS: 83 U/L (ref 39–117)
ALT: 14 U/L (ref 0–35)
AST: 24 U/L (ref 0–37)
Albumin: 4.7 g/dL (ref 3.5–5.2)
BUN: 26 mg/dL — ABNORMAL HIGH (ref 6–23)
CO2: 31 mEq/L (ref 19–32)
CREATININE: 1.14 mg/dL (ref 0.40–1.20)
Calcium: 10.6 mg/dL — ABNORMAL HIGH (ref 8.4–10.5)
Chloride: 100 mEq/L (ref 96–112)
GFR: 48.59 mL/min — AB (ref >60.00)
Glucose, Bld: 88 mg/dL (ref 70–99)
Potassium: 4.5 mEq/L (ref 3.5–5.1)
Sodium: 139 mEq/L (ref 135–145)
TOTAL PROTEIN: 7.5 g/dL (ref 6.0–8.3)
Total Bilirubin: 0.5 mg/dL (ref 0.2–1.2)

## 2016-03-11 LAB — LIPID PANEL
Cholesterol: 144 mg/dL (ref 0–200)
HDL: 45.9 mg/dL (ref >39.00)
LDL Cholesterol: 78 mg/dL (ref 0–99)
NONHDL: 98.4
Total CHOL/HDL Ratio: 3
Triglycerides: 100 mg/dL (ref 0.0–149.0)
VLDL: 20 mg/dL (ref 0.0–40.0)

## 2016-03-11 MED ORDER — AMLODIPINE BESY-BENAZEPRIL HCL 5-20 MG PO CAPS: ORAL_CAPSULE | ORAL | Status: DC

## 2016-03-11 MED ORDER — OMEPRAZOLE 40 MG PO CPDR: 40.0000 mg | DELAYED_RELEASE_CAPSULE | Freq: Every day | ORAL | Status: DC

## 2016-03-11 NOTE — Progress Notes (Signed)
Subjective:    Patient ID: Robin Alexander, female    DOB: 04/26/1933, 82 y.o.   MRN: [id number]  HPI  82YO female presents for physical exam  Had a fall four weeks ago. Was knocked over by ice cream machine at [employer]. Hit in chest. Landed on bottom. Seen by ortho and has MRI hips ordered for follow up.  Aside from this, feeling well.   Wt Readings from Last 3 Encounters:  03/11/16 152 lb 12.8 oz (69.31 kg)  01/17/16 149 lb 6.4 oz (67.767 kg)  07/20/15 152 lb 2 oz (69.003 kg)   BP Readings from Last 3 Encounters:  03/11/16 132/50  01/17/16 130/64  07/20/15 119/55    Past Medical History  Diagnosis Date  . HTN (hypertension)   . Arthritis   . Chicken pox   . High cholesterol   . Heart murmur   . Pneumonia     spring 2015  . GERD (gastroesophageal reflux disease)    Family History  Problem Relation Age of Onset  . Arthritis Mother   . Heart disease Mother   . Arthritis Father   . Stroke Brother   . Heart disease Brother   . Cancer Neg Hx    Past Surgical History  Procedure Laterality Date  . Total hip arthroplasty  2005 & 2006    Bilateral, [place]  . Appendectomy  1950  . Abdominal hysterectomy      in 50's  . Cesarean section      2  . Cataract extraction w/ intraocular lens  implant, bilateral    . Rotator cuff repair      right shoulder   10 YRS  . Anterior lat lumbar fusion Right 02/28/2015    Procedure: ANTERIOR LATERAL LUMBAR FUSION 1 LEVEL;  Surgeon: Phylliss Bob, MD;  Location: [place];  Service: Orthopedics;  Laterality: Right;  Right sided lumbar 3-4 lateral interbody fusion   Social History   Social History  . Marital Status: Married    Spouse Name: N/A  . Number of Children: 2  . Years of Education: N/A   Occupational History  . Retired - Press photographer    Social History Main Topics  . Smoking status: Former Smoker -- 0.75 packs/day for 20 years    Types: Cigarettes    Quit date: 09/02/1983  . Smokeless tobacco: Never Used  .  Alcohol Use: No     Comment: Occasional wine  . Drug Use: No  . Sexual Activity: Yes    Birth Control/ Protection: Coitus interruptus   Other Topics Concern  . None   Social History Narrative   Regular Exercise -  Active but no regular exercise routine   Daily Caffeine Use:  NO          Review of Systems  Constitutional: Negative for fever, chills, appetite change, fatigue and unexpected weight change.  HENT: Negative for congestion.   Eyes: Negative for visual disturbance.  Respiratory: Negative for cough, chest tightness and shortness of breath.   Cardiovascular: Negative for chest pain and leg swelling.  Gastrointestinal: Negative for nausea, vomiting, abdominal pain, diarrhea and constipation.  Musculoskeletal: Positive for myalgias, back pain and arthralgias.  Skin: Negative for color change and rash.  Neurological: Negative for weakness.  Hematological: Negative for adenopathy. Does not bruise/bleed easily.  Psychiatric/Behavioral: Negative for suicidal ideas and dysphoric mood. The patient is not nervous/anxious.        Objective:    BP 132/50 mmHg  Pulse 70  Ht 5' (1.524 m)  Wt 152 lb 12.8 oz (69.31 kg)  BMI 29.84 kg/m2  SpO2 98% Physical Exam  Constitutional: She is oriented to person, place, and time. She appears well-developed and well-nourished. No distress.  HENT:  Head: Normocephalic and atraumatic.  Right Ear: External ear normal.  Left Ear: External ear normal.  Nose: Nose normal.  Mouth/Throat: Oropharynx is clear and moist. No oropharyngeal exudate.  Eyes: Conjunctivae are normal. Pupils are equal, round, and reactive to light. Right eye exhibits no discharge. Left eye exhibits no discharge. No scleral icterus.  Neck: Normal range of motion. Neck supple. No tracheal deviation present. No thyromegaly present.  Cardiovascular: Normal rate, regular rhythm, normal heart sounds and intact distal pulses.  Exam reveals no gallop and no friction rub.   No  murmur heard. Pulmonary/Chest: Effort normal and breath sounds normal. No accessory muscle usage. No tachypnea. No respiratory distress. She has no decreased breath sounds. She has no wheezes. She has no rales. She exhibits no tenderness. Right breast exhibits no inverted nipple, no mass, no nipple discharge, no skin change and no tenderness. Left breast exhibits no inverted nipple, no mass, no nipple discharge, no skin change and no tenderness. Breasts are symmetrical.  Abdominal: Soft. Bowel sounds are normal. She exhibits no distension and no mass. There is no tenderness. There is no rebound and no guarding.  Musculoskeletal: Normal range of motion. She exhibits no edema or tenderness.  Lymphadenopathy:    She has no cervical adenopathy.  Neurological: She is alert and oriented to person, place, and time. No cranial nerve deficit. She exhibits normal muscle tone. Coordination normal.  Skin: Skin is warm and dry. No rash noted. She is not diaphoretic. No erythema. No pallor.  Psychiatric: She has a normal mood and affect. Her behavior is normal. Judgment and thought content normal.          Assessment & Plan:   Problem List Items Addressed This Visit      Unprioritized   Routine general medical examination at a health care facility - Primary    General medical exam normal today including breast exam. PAP and pelvic deferred given age and s/p hysterectomy. Colonoscopy UTD. Mammogram ordered. Bone density UTD. Labs ordered. Encouraged healthy diet and exercise. Immunizations UTD.      Relevant Orders   MM Digital Screening   Comprehensive metabolic panel   Lipid panel       Return in about 4 weeks (around 04/08/2016) for New Patient.  Ronette Deter, MD Internal Medicine [facility]

## 2016-03-11 NOTE — Patient Instructions (Signed)
Health Maintenance, Female Adopting a healthy lifestyle and getting preventive care can go a long way to promote health and wellness. Talk with your health care provider about what schedule of regular examinations is right for you. This is a good chance for you to check in with your provider about disease prevention and staying healthy. In between checkups, there are plenty of things you can do on your own. Experts have done a lot of research about which lifestyle changes and preventive measures are most likely to keep you healthy. Ask your health care provider for more information. WEIGHT AND DIET  Eat a healthy diet  Be sure to include plenty of vegetables, fruits, low-fat dairy products, and lean protein.  Do not eat a lot of foods high in solid fats, added sugars, or salt.  Get regular exercise. This is one of the most important things you can do for your health.  Most adults should exercise for at least 150 minutes each week. The exercise should increase your heart rate and make you sweat (moderate-intensity exercise).  Most adults should also do strengthening exercises at least twice a week. This is in addition to the moderate-intensity exercise.  Maintain a healthy weight  Body mass index (BMI) is a measurement that can be used to identify possible weight problems. It estimates body fat based on height and weight. Your health care provider can help determine your BMI and help you achieve or maintain a healthy weight.  For females 20 years of age and older:   A BMI below 18.5 is considered underweight.  A BMI of 18.5 to 24.9 is normal.  A BMI of 25 to 29.9 is considered overweight.  A BMI of 30 and above is considered obese.  Watch levels of cholesterol and blood lipids  You should start having your blood tested for lipids and cholesterol at 80 years of age, then have this test every 5 years.  You may need to have your cholesterol levels checked more often if:  Your lipid  or cholesterol levels are high.  You are older than 80 years of age.  You are at high risk for heart disease.  CANCER SCREENING   Lung Cancer  Lung cancer screening is recommended for adults 55-80 years old who are at high risk for lung cancer because of a history of smoking.  A yearly low-dose CT scan of the lungs is recommended for people who:  Currently smoke.  Have quit within the past 15 years.  Have at least a 30-pack-year history of smoking. A pack year is smoking an average of one pack of cigarettes a day for 1 year.  Yearly screening should continue until it has been 15 years since you quit.  Yearly screening should stop if you develop a health problem that would prevent you from having lung cancer treatment.  Breast Cancer  Practice breast self-awareness. This means understanding how your breasts normally appear and feel.  It also means doing regular breast self-exams. Let your health care provider know about any changes, no matter how small.  If you are in your 20s or 30s, you should have a clinical breast exam (CBE) by a health care provider every 1-3 years as part of a regular health exam.  If you are 40 or older, have a CBE every year. Also consider having a breast X-ray (mammogram) every year.  If you have a family history of breast cancer, talk to your health care provider about genetic screening.  If you   are at high risk for breast cancer, talk to your health care provider about having an MRI and a mammogram every year.  Breast cancer gene (BRCA) assessment is recommended for women who have family members with BRCA-related cancers. BRCA-related cancers include:  Breast.  Ovarian.  Tubal.  Peritoneal cancers.  Results of the assessment will determine the need for genetic counseling and BRCA1 and BRCA2 testing. Cervical Cancer Your health care provider may recommend that you be screened regularly for cancer of the pelvic organs (ovaries, uterus, and  vagina). This screening involves a pelvic examination, including checking for microscopic changes to the surface of your cervix (Pap test). You may be encouraged to have this screening done every 3 years, beginning at age 21.  For women ages 30-65, health care providers may recommend pelvic exams and Pap testing every 3 years, or they may recommend the Pap and pelvic exam, combined with testing for human papilloma virus (HPV), every 5 years. Some types of HPV increase your risk of cervical cancer. Testing for HPV may also be done on women of any age with unclear Pap test results.  Other health care providers may not recommend any screening for nonpregnant women who are considered low risk for pelvic cancer and who do not have symptoms. Ask your health care provider if a screening pelvic exam is right for you.  If you have had past treatment for cervical cancer or a condition that could lead to cancer, you need Pap tests and screening for cancer for at least 20 years after your treatment. If Pap tests have been discontinued, your risk factors (such as having a new sexual partner) need to be reassessed to determine if screening should resume. Some women have medical problems that increase the chance of getting cervical cancer. In these cases, your health care provider may recommend more frequent screening and Pap tests. Colorectal Cancer  This type of cancer can be detected and often prevented.  Routine colorectal cancer screening usually begins at 80 years of age and continues through 80 years of age.  Your health care provider may recommend screening at an earlier age if you have risk factors for colon cancer.  Your health care provider may also recommend using home test kits to check for hidden blood in the stool.  A small camera at the end of a tube can be used to examine your colon directly (sigmoidoscopy or colonoscopy). This is done to check for the earliest forms of colorectal  cancer.  Routine screening usually begins at age 50.  Direct examination of the colon should be repeated every 5-10 years through 80 years of age. However, you may need to be screened more often if early forms of precancerous polyps or small growths are found. Skin Cancer  Check your skin from head to toe regularly.  Tell your health care provider about any new moles or changes in moles, especially if there is a change in a mole's shape or color.  Also tell your health care provider if you have a mole that is larger than the size of a pencil eraser.  Always use sunscreen. Apply sunscreen liberally and repeatedly throughout the day.  Protect yourself by wearing long sleeves, pants, a wide-brimmed hat, and sunglasses whenever you are outside. HEART DISEASE, DIABETES, AND HIGH BLOOD PRESSURE   High blood pressure causes heart disease and increases the risk of stroke. High blood pressure is more likely to develop in:  People who have blood pressure in the high end   of the normal range (130-139/85-89 mm Hg).  People who are overweight or obese.  People who are African American.  If you are 38-23 years of age, have your blood pressure checked every 3-5 years. If you are 61 years of age or older, have your blood pressure checked every year. You should have your blood pressure measured twice--once when you are at a hospital or clinic, and once when you are not at a hospital or clinic. Record the average of the two measurements. To check your blood pressure when you are not at a hospital or clinic, you can use:  An automated blood pressure machine at a pharmacy.  A home blood pressure monitor.  If you are between 45 years and 39 years old, ask your health care provider if you should take aspirin to prevent strokes.  Have regular diabetes screenings. This involves taking a blood sample to check your fasting blood sugar level.  If you are at a normal weight and have a low risk for diabetes,  have this test once every three years after 80 years of age.  If you are overweight and have a high risk for diabetes, consider being tested at a younger age or more often. PREVENTING INFECTION  Hepatitis B  If you have a higher risk for hepatitis B, you should be screened for this virus. You are considered at high risk for hepatitis B if:  You were born in a country where hepatitis B is common. Ask your health care provider which countries are considered high risk.  Your parents were born in a high-risk country, and you have not been immunized against hepatitis B (hepatitis B vaccine).  You have HIV or AIDS.  You use needles to inject street drugs.  You live with someone who has hepatitis B.  You have had sex with someone who has hepatitis B.  You get hemodialysis treatment.  You take certain medicines for conditions, including cancer, organ transplantation, and autoimmune conditions. Hepatitis C  Blood testing is recommended for:  Everyone born from [birth year] through 1963.  Anyone with known risk factors for hepatitis C. Sexually transmitted infections (STIs)  You should be screened for sexually transmitted infections (STIs) including gonorrhea and chlamydia if:  You are sexually active and are younger than 80 years of age.  You are older than 80 years of age and your health care provider tells you that you are at risk for this type of infection.  Your sexual activity has changed since you were last screened and you are at an increased risk for chlamydia or gonorrhea. Ask your health care provider if you are at risk.  If you do not have HIV, but are at risk, it may be recommended that you take a prescription medicine daily to prevent HIV infection. This is called pre-exposure prophylaxis (PrEP). You are considered at risk if:  You are sexually active and do not regularly use condoms or know the HIV status of your partner(s).  You take drugs by injection.  You are sexually  active with a partner who has HIV. Talk with your health care provider about whether you are at high risk of being infected with HIV. If you choose to begin PrEP, you should first be tested for HIV. You should then be tested every 3 months for as long as you are taking PrEP.  PREGNANCY   If you are premenopausal and you may become pregnant, ask your health care provider about preconception counseling.  If you may  become pregnant, take 400 to 800 micrograms (mcg) of folic acid every day.  If you want to prevent pregnancy, talk to your health care provider about birth control (contraception). OSTEOPOROSIS AND MENOPAUSE   Osteoporosis is a disease in which the bones lose minerals and strength with aging. This can result in serious bone fractures. Your risk for osteoporosis can be identified using a bone density scan.  If you are 61 years of age or older, or if you are at risk for osteoporosis and fractures, ask your health care provider if you should be screened.  Ask your health care provider whether you should take a calcium or vitamin D supplement to lower your risk for osteoporosis.  Menopause may have certain physical symptoms and risks.  Hormone replacement therapy may reduce some of these symptoms and risks. Talk to your health care provider about whether hormone replacement therapy is right for you.  HOME CARE INSTRUCTIONS   Schedule regular health, dental, and eye exams.  Stay current with your immunizations.   Do not use any tobacco products including cigarettes, chewing tobacco, or electronic cigarettes.  If you are pregnant, do not drink alcohol.  If you are breastfeeding, limit how much and how often you drink alcohol.  Limit alcohol intake to no more than 1 drink per day for nonpregnant women. One drink equals 12 ounces of beer, 5 ounces of wine, or 1 ounces of hard liquor.  Do not use street drugs.  Do not share needles.  Ask your health care provider for help if  you need support or information about quitting drugs.  Tell your health care provider if you often feel depressed.  Tell your health care provider if you have ever been abused or do not feel safe at home.   This information is not intended to replace advice given to you by your health care provider. Make sure you discuss any questions you have with your health care provider.   Document Released: 03/03/2011 Document Revised: 09/08/2014 Document Reviewed: 07/20/2013 Elsevier Interactive Patient Education [employer].

## 2016-03-11 NOTE — Progress Notes (Signed)
Pre visit review using our clinic review tool, if applicable. No additional management support is needed unless otherwise documented below in the visit note. 

## 2016-03-11 NOTE — Assessment & Plan Note (Signed)
General medical exam normal today including breast exam. PAP and pelvic deferred given age and s/p hysterectomy. Colonoscopy UTD. Mammogram ordered. Bone density UTD. Labs ordered. Encouraged healthy diet and exercise. Immunizations UTD.

## 2016-03-13 DIAGNOSIS — M545 Low back pain: Secondary | ICD-10-CM | POA: Diagnosis not present

## 2016-03-24 DIAGNOSIS — M545 Low back pain: Secondary | ICD-10-CM | POA: Diagnosis not present

## 2016-03-24 DIAGNOSIS — M7061 Trochanteric bursitis, right hip: Secondary | ICD-10-CM | POA: Diagnosis not present

## 2016-04-10 ENCOUNTER — Telehealth: Payer: Self-pay | Admitting: *Deleted

## 2016-04-10 NOTE — Telephone Encounter (Signed)
Patient has physical therapy, after therapy pt fell in the parking lot injuring her left foot. She can walk on the foot however she has swelling, she's scheduled

## 2016-04-11 ENCOUNTER — Ambulatory Visit (INDEPENDENT_AMBULATORY_CARE_PROVIDER_SITE_OTHER): Payer: Medicare Other

## 2016-04-11 ENCOUNTER — Ambulatory Visit: Payer: Medicare Other | Admitting: Family Medicine

## 2016-04-11 ENCOUNTER — Ambulatory Visit (INDEPENDENT_AMBULATORY_CARE_PROVIDER_SITE_OTHER): Payer: Medicare Other | Admitting: Family Medicine

## 2016-04-11 VITALS — BP 134/58 | HR 68 | Temp 97.9°F | Resp 12 | Wt 150.0 lb

## 2016-04-11 DIAGNOSIS — S92302A Fracture of unspecified metatarsal bone(s), left foot, initial encounter for closed fracture: Secondary | ICD-10-CM | POA: Diagnosis not present

## 2016-04-11 DIAGNOSIS — M25572 Pain in left ankle and joints of left foot: Secondary | ICD-10-CM

## 2016-04-11 DIAGNOSIS — S92309A Fracture of unspecified metatarsal bone(s), unspecified foot, initial encounter for closed fracture: Secondary | ICD-10-CM | POA: Insufficient documentation

## 2016-04-11 NOTE — Assessment & Plan Note (Signed)
Patient with evidence metatarsal fracture on x-ray imaging in the office. Confirmed by radiology read. She was set up with a orthopedic office visit this afternoon at [facility] and [facility] orthopedics. We attempted to give her crutches today though we did not have any that were appropriate for her height. No obvious tenderness or bony abnormalities on exam of her knee or left hand and wrist. Likely soft tissue injuries. She will see the orthopedic office for further management.

## 2016-04-11 NOTE — Patient Instructions (Signed)
Nice to meet you. We'll get an x-ray for left foot and ankle to evaluate for broken bone. Provided to with a prescription for a boot to wear if desired. If you have a broken bone will heavy pick this up. If you do not he may consider this to help rest the ankle.

## 2016-04-11 NOTE — Progress Notes (Signed)
Pre visit review using our clinic review tool, if applicable. No additional management support is needed unless otherwise documented below in the visit note. 

## 2016-04-11 NOTE — Progress Notes (Signed)
Tommi Rumps, MD Phone: [phone]  Robin Alexander is a 82 y.o. female who presents today for same-day visit.  Patient notes she was coming out of physical therapy yesterday when she caught an edge on the side work and inverted her left foot. Notes she had immediate discomfort in the lateral aspect of her left foot. Had bruising. She also landed on her right knee and left hand. there was no pain in her left hand. There is an abrasion on her right knee that is somewhat painful. She was able to walk on her foot after this occurred. She had no head injury. She had no preceding symptoms prior to the fall. Reports it was tripping over a curb.  ROS see history of present illness  Objective  Physical Exam Vitals:   04/11/16 1338  BP: (!) 134/58  Pulse: 68  Resp: 12  Temp: 97.9 F (36.6 C)    BP Readings from Last 3 Encounters:  04/11/16 (!) 134/58  03/11/16 (!) 132/50  01/17/16 130/64   Wt Readings from Last 3 Encounters:  04/11/16 150 lb (68 kg)  03/11/16 152 lb 12.8 oz (69.3 kg)  01/17/16 149 lb 6.4 oz (67.8 kg)    Physical Exam  Constitutional: No distress.  Cardiovascular: Normal rate, regular rhythm and normal heart sounds.   Pulmonary/Chest: Effort normal and breath sounds normal.  Musculoskeletal:  Left foot with bruising and swelling along the lateral aspect in inferior ankle, there is tenderness of the fifth head of the metatarsal, no tenderness along the length of the fifth metatarsal, no navicular tenderness, lateral malleoli tenderness, or medial malleolar tenderness, foot is warm and well perfused with 2+ DP pulses Right foot with no tenderness or bruising, 2+ DP pulses right foot Right knee with abrasion over the patella, no bony tenderness or joint line tenderness, no ligamentous laxity, negative McMurray's left hand with no tenderness, there is an abrasion on the palmar side of the thumb that is not bleeding, there is bruising over the dorsal aspect of the  ulnar side of the hand and wrist, there is no bony tenderness or bony defects, there is no anatomic snuffbox tenderness on the left, 2+ radial pulse on the left  Neurological: She is alert.  5 out of 5 strength left grip, bilateral plantar flexion, dorsiflexion, inversion, and eversion, sensation to light touch intact bilateral feet and hands  Skin: Skin is warm and dry. She is not diaphoretic.     Assessment/Plan: Please see individual problem list.  Metatarsal fracture Patient with evidence metatarsal fracture on x-ray imaging in the office. Confirmed by radiology read. She was set up with a orthopedic office visit this afternoon at [facility] and [facility]oped[facility]. We attempted to give her crutches today though we did not have any that were appropriate for her height. No obvious tenderness or bony abnormalities on exam of her knee or left hand and wrist. Likely soft tissue injuries. She will see the orthopedic office for further management.   Orders Placed This Encounter  Procedures  . DG Foot Complete Left    Standing Status:   Future    Number of Occurrences:   1    Standing Expiration Date:   06/11/2017    Order Specific Question:   Reason for Exam (SYMPTOM  OR DIAGNOSIS REQUIRED)    Answer:   ankle inversion, TTP left 5th MTP head    Order Specific Question:   Preferred imaging location?    Answer:   [facility]  .  DG Ankle Complete Left    Standing Status:   Future    Number of Occurrences:   1    Standing Expiration Date:   06/11/2017    Order Specific Question:   Reason for Exam (SYMPTOM  OR DIAGNOSIS REQUIRED)    Answer:   ankle inversion, TTP left 5th MTP head    Order Specific Question:   Preferred imaging location?    Answer:   [facility]  . Ambulatory referral to Orthopedic Surgery    Referral Priority:   Routine    Referral Type:   Surgical    Referral Reason:   Specialty Services Required    Requested Specialty:   Orthopedic Surgery     Number of Visits Requested:   1    Tommi Rumps, MD [place]

## 2016-04-24 ENCOUNTER — Other Ambulatory Visit: Payer: Self-pay | Admitting: Family Medicine

## 2016-04-24 NOTE — Telephone Encounter (Signed)
Refill done.  

## 2016-05-02 ENCOUNTER — Ambulatory Visit (INDEPENDENT_AMBULATORY_CARE_PROVIDER_SITE_OTHER): Payer: Medicare Other | Admitting: Family Medicine

## 2016-05-02 ENCOUNTER — Encounter: Payer: Self-pay | Admitting: Family Medicine

## 2016-05-02 DIAGNOSIS — K219 Gastro-esophageal reflux disease without esophagitis: Secondary | ICD-10-CM

## 2016-05-02 DIAGNOSIS — I10 Essential (primary) hypertension: Secondary | ICD-10-CM

## 2016-05-02 DIAGNOSIS — Z23 Encounter for immunization: Secondary | ICD-10-CM

## 2016-05-02 DIAGNOSIS — S92302A Fracture of unspecified metatarsal bone(s), left foot, initial encounter for closed fracture: Secondary | ICD-10-CM | POA: Diagnosis not present

## 2016-05-02 MED ORDER — PANTOPRAZOLE SODIUM 40 MG PO TBEC: 40.0000 mg | DELAYED_RELEASE_TABLET | Freq: Two times a day (BID) | ORAL | 0 refills | Status: DC

## 2016-05-02 NOTE — Assessment & Plan Note (Signed)
At goal. Continue current medications. 

## 2016-05-02 NOTE — Patient Instructions (Signed)
Nice to see you. We will start you on protonix for your reflux. You can stop the prilosec. Stop the glucosamine and vitamin c. Keep your follow-up with orthopedics.

## 2016-05-02 NOTE — Progress Notes (Signed)
Pre visit review using our clinic review tool, if applicable. No additional management support is needed unless otherwise documented below in the visit note. 

## 2016-05-02 NOTE — Assessment & Plan Note (Signed)
Not well-controlled. She didn't try the Protonix as previously prescribed. We will trial this for a month and if no improvement will refer to GI.

## 2016-05-02 NOTE — Progress Notes (Signed)
  Tommi Rumps, MD Phone: [phone]  Robin Alexander is a 82 y.o. female who presents today for follow-up.  GERD: Patient notes daily issues with this. Burning and sour taste. Takes Prilosec. Notes no specific foods other than maybe hotdogs and tomatoes. No blood in her stool. No abdominal pain.  HYPERTENSION  Disease Monitoring  Home BP Monitoring 130s over 60s Chest pain-     no Dyspnea- no Medications  Compliance-   taking amlodipine, benazepril.   Edema- no  Left foot metatarsal fracture: doing well. No pain. No swelling. No pain medications. Saw ortho and has been in a boot. Follows up with ortho next week.   Patient wanted to know what vitamins she could stop taking. She is taking glucosamine and vitamin C which she does not know if these do anything for her.   PMH: Former smoker   ROS see history of present illness  Objective  Physical Exam Vitals:   05/02/16 0804  BP: 138/66  Pulse: 74  Temp: 97.9 F (36.6 C)    BP Readings from Last 3 Encounters:  05/02/16 138/66  04/11/16 (!) 134/58  03/11/16 (!) 132/50   Wt Readings from Last 3 Encounters:  05/02/16 153 lb 3.2 oz (69.5 kg)  04/11/16 150 lb (68 kg)  03/11/16 152 lb 12.8 oz (69.3 kg)    Physical Exam  Constitutional: No distress.  HENT:  Head: Normocephalic and atraumatic.  Cardiovascular: Normal rate and regular rhythm.   Murmur (2/6 systolic) heard. Pulmonary/Chest: Effort normal and breath sounds normal.  Musculoskeletal:  Boot in place on left foot, on examination of her left foot with the boot off there is no tenderness or swelling, 2+ DP pulses and left foot  Neurological: She is alert.  Skin: Skin is warm and dry. She is not diaphoretic.     Assessment/Plan: Please see individual problem list.  Hypertension At goal. Continue current medications.  GERD (gastroesophageal reflux disease) Not well-controlled. She didn't try the Protonix as previously prescribed. We will trial this for  a month and if no improvement will refer to GI.  Metatarsal fracture Doing well. She is in a boot. She'll continue to wear this until she follows up with orthopedics.  Patient will discontinue the vitamin C and glucosamine as I'm unsure if she benefits from these vitamins.  No orders of the defined types were placed in this encounter.   Meds ordered this encounter  Medications  . pantoprazole (PROTONIX) 40 MG tablet    Sig: Take 1 tablet (40 mg total) by mouth 2 (two) times daily before a meal.    Dispense:  60 tablet    Refill:  0    Tommi Rumps, MD [place]

## 2016-05-02 NOTE — Assessment & Plan Note (Signed)
Doing well. She is in a boot. She'll continue to wear this until she follows up with orthopedics.

## 2016-05-09 ENCOUNTER — Other Ambulatory Visit: Payer: Self-pay | Admitting: Family Medicine

## 2016-05-09 ENCOUNTER — Ambulatory Visit: Payer: Medicare Other

## 2016-05-23 ENCOUNTER — Ambulatory Visit (INDEPENDENT_AMBULATORY_CARE_PROVIDER_SITE_OTHER): Payer: Medicare Other

## 2016-05-23 VITALS — BP 120/68 | HR 71 | Temp 97.7°F | Resp 14 | Ht 59.5 in | Wt 151.8 lb

## 2016-05-23 DIAGNOSIS — Z0000 Encounter for general adult medical examination without abnormal findings: Secondary | ICD-10-CM | POA: Diagnosis not present

## 2016-05-23 NOTE — Progress Notes (Addendum)
Subjective:   Robin Alexander is a 82 y.o. female who presents for Medicare Annual (Subsequent) preventive examination.  Review of Systems:  No ROS.  Medicare Wellness Visit. Cardiac Risk Factors include: advanced age (>51men, >36 women);hypertension     Objective:     Vitals: BP 120/68 (BP Location: Left Arm, Patient Position: Sitting, Cuff Size: Normal)   Pulse 71   Temp 97.7 F (36.5 C) (Oral)   Resp 14   Ht 4' 11.5" (1.511 m)   Wt 151 lb 12.8 oz (68.9 kg)   SpO2 98%   BMI 30.15 kg/m   Body mass index is 30.15 kg/m.   Tobacco History  Smoking Status  . Former Smoker  . Packs/day: 0.75  . Years: 20.00  . Types: Cigarettes  . Quit date: 09/02/1983  Smokeless Tobacco  . Never Used     Counseling given: Not Answered   Past Medical History:  Diagnosis Date  . Arthritis   . Chicken pox   . GERD (gastroesophageal reflux disease)   . Heart murmur   . High cholesterol   . HTN (hypertension)   . Pneumonia    spring 2015   Past Surgical History:  Procedure Laterality Date  . ABDOMINAL HYSTERECTOMY     in 50's  . ANTERIOR LAT LUMBAR FUSION Right 02/28/2015   Procedure: ANTERIOR LATERAL LUMBAR FUSION 1 LEVEL;  Surgeon: Phylliss Bob, MD;  Location: [facility];  Service: Orthopedics;  Laterality: Right;  Right sided lumbar 3-4 lateral interbody fusion  . APPENDECTOMY  1950  . CATARACT EXTRACTION W/ INTRAOCULAR LENS  IMPLANT, BILATERAL    . CESAREAN SECTION     2  . ROTATOR CUFF REPAIR     right shoulder   10 YRS  . TOTAL HIP ARTHROPLASTY  2005 & 2006   Bilateral, [place]   Family History  Problem Relation Age of Onset  . Arthritis Mother   . Heart disease Mother   . Arthritis Father   . Stroke Brother   . Heart disease Brother   . Cancer Neg Hx    History  Sexual Activity  . Sexual activity: Yes  . Birth control/ protection: Coitus interruptus    Outpatient Encounter Prescriptions as of 05/23/2016  Medication Sig  . allopurinol (ZYLOPRIM) 100 MG  tablet TAKE 1 TABLET BY MOUTH 2  TIMES DAILY. NEED  APPOINTMENT FOR FUTURE  REFILLS  . amLODipine-benazepril (LOTREL) 5-20 MG capsule Take 1 capsule by mouth  daily  . aspirin EC 81 MG tablet Take 81 mg by mouth daily.    . cholecalciferol (VITAMIN D) 1000 UNITS tablet Take 1,000 Units by mouth daily.    Marland Kitchen gabapentin (NEURONTIN) 100 MG capsule Take 1 capsule by mouth at  bedtime  . meloxicam (MOBIC) 15 MG tablet Take 1 tablet (15 mg total) by mouth daily.  . Multiple Vitamin (MULTIVITAMIN) capsule Take 1 capsule by mouth daily.    . pantoprazole (PROTONIX) 40 MG tablet TAKE 1 TABLET BY MOUTH 2  TIMES DAILY BEFORE A MEAL.  Marland Kitchen Sennosides-Docusate Sodium ([facility] STOOL SOFTENER/LAXATIVE PO) Take 1 capsule by mouth daily as needed.   . simvastatin (ZOCOR) 20 MG tablet Take 1 tablet by mouth  every morning  . Turmeric 500 MG CAPS Take 1 capsule by mouth daily.  . diphenhydrAMINE (BENADRYL) 25 MG tablet Take 25 mg by mouth daily as needed for allergies.    No facility-administered encounter medications on file as of 05/23/2016.     Activities of Daily  Living In your present state of health, do you have any difficulty performing the following activities: 05/23/2016  Hearing? N  Vision? N  Difficulty concentrating or making decisions? N  Walking or climbing stairs? Y  Dressing or bathing? N  Doing errands, shopping? N  Preparing Food and eating ? N  Using the Toilet? N  In the past six months, have you accidently leaked urine? N  Do you have problems with loss of bowel control? N  Managing your Medications? N  Managing your Finances? N  Housekeeping or managing your Housekeeping? N  Some recent data might be hidden    Patient Care Team: Jackolyn Confer, MD as PCP - General (Internal Medicine)    Assessment:    This is a routine wellness examination for [place]. The goal of the wellness visit is to assist the patient how to close the gaps in care and create a preventative care plan for the  patient.   Taking calcium VIT D as appropriate/Osteoporosis risk reviewed.  Medications reviewed; taking without issues or barriers.  HTN; well controlled on current regimen.    Safety issues reviewed; smoke detectors in the home. Firearms locked in a safe in the home. Wears seatbelts when driving or riding with others. No violence in the home.  No identified risk were noted; The patient was oriented x 3; appropriate in dress and manner and no objective failures at ADL's or IADL's.   Body mass index; overweight; pre-obese. Discussed the importance of a healthy diet, water intake and exercise. She does have a healthy diet, limited exercise regimen due to a break in the L foot and has a goal to increase water intake.  Educational material provided.  Dentist; OV every 6 months (Dr. Ronita Hipps)  Health maintenance gaps; closed.  Patient Concerns: Arthritis pain; chronic.  Interested in taking a medication other than ibuprofen for pain.  Deferred to PCP for follow up.  Exercise Activities and Dietary recommendations Current Exercise Habits: Home exercise routine;Structured exercise class (Home/bed exercises in the twice daily for a total of 20 minutes.), Time (Minutes): 60, Frequency (Times/Week): 2, Weekly Exercise (Minutes/Week): 120, Intensity: Mild  Goals    . Increase physical activity          Continue with physical therapy sessions 2 times a week and home exercise routine of 20 minute stretching.  Begin exercising at the [facility] 3 times weekly when able to tolerate.    . Increase water intake          STAY HYDRATED AND DRINK PLENTY OF FLUIDS/WATER      Fall Risk Fall Risk  05/23/2016 03/11/2016 05/23/2015 05/15/2014 05/04/2013  Falls in the past year? Yes No No No No  Number falls in past yr: 1 - - - -  Injury with Fall? Yes - - - -  Follow up Education provided;Falls prevention discussed - - - -   Depression Screen PHQ 2/9 Scores 05/23/2016 03/11/2016 05/23/2015 05/15/2014  PHQ  - 2 Score 0 0 0 0     Cognitive Testing MMSE - Mini Mental State Exam 05/23/2016 05/23/2015  Orientation to time 5 5  Orientation to Place 5 5  Registration 3 3  Attention/ Calculation 5 5  Recall 3 3  Language- name 2 objects 2 2  Language- repeat 1 1  Language- follow 3 step command 3 3  Language- read & follow direction 1 1  Write a sentence 1 1  Copy design 1 1  Total score 30 30  Immunization History  Administered Date(s) Administered  . Influenza Split 07/31/2011, 06/29/2012  . Influenza, High Dose Seasonal PF 05/23/2015, 05/02/2016  . Influenza,inj,Quad PF,36+ Mos 05/04/2013, 05/15/2014  . Pneumococcal Conjugate-13 11/03/2013  . Pneumococcal Polysaccharide-23 09/01/2009  . Tdap 05/23/2015  . Zoster 07/30/2010   Screening Tests Health Maintenance  Topic Date Due  . TETANUS/TDAP  05/22/2025  . INFLUENZA VACCINE  Completed  . DEXA SCAN  Completed  . ZOSTAVAX  Completed  . PNA vac Low Risk Adult  Completed      Plan:   End of life planning; Advance aging; Advanced directives discussed. Copy of current HCPOA/Living Will on file.  Medicare Attestation I have personally reviewed: The patient's medical and social history Their use of alcohol, tobacco or illicit drugs Their current medications and supplements The patient's functional ability including ADLs,fall risks, home safety risks, cognitive, and hearing and visual impairment Diet and physical activities Evidence for depression   The patient's weight, height, BMI, and visual acuity have been recorded in the chart.  I have made referrals and provided education to the patient based on review of the above and I have provided the patient with a written personalized care plan for preventive services.    During the course of the visit the patient was educated and counseled about the following appropriate screening and preventive services:   Vaccines to include Pneumoccal, Influenza, Hepatitis B, Td, Zostavax,  HCV  Electrocardiogram  Cardiovascular Disease  Colorectal cancer screening  Bone density screening  Diabetes screening  Glaucoma screening  Mammography/PAP  Nutrition counseling   Patient Instructions (the written plan) was given to the patient.   Varney Biles, LPN  [phone]

## 2016-05-23 NOTE — Patient Instructions (Addendum)
Ms. Robin Alexander , Thank you for taking time to come for your Medicare Wellness Visit. I appreciate your ongoing commitment to your health goals. Please review the following plan we discussed and let me know if I can assist you in the future.   FOLLOW UP WITH DR. SONNENBERG AS NEEDED.  These are the goals we discussed: Goals    . Increase physical activity          Continue with physical therapy sessions 2 times a week and home exercise routine of 20 minute stretching.  Begin exercising at the [facility] 3 times weekly when able to tolerate.    . Increase water intake          STAY HYDRATED AND DRINK PLENTY OF FLUIDS/WATER       This is a list of the screening recommended for you and due dates:  Health Maintenance  Topic Date Due  . Tetanus Vaccine  05/22/2025  . Flu Shot  Completed  . DEXA scan (bone density measurement)  Completed  . Shingles Vaccine  Completed  . Pneumonia vaccines  Completed      Fall Prevention in the Home  Falls can cause injuries. They can happen to people of all ages. There are many things you can do to make your home safe and to help prevent falls.  WHAT CAN I DO ON THE OUTSIDE OF MY HOME?  Regularly fix the edges of walkways and driveways and fix any cracks.  Remove anything that might make you trip as you walk through a door, such as a raised step or threshold.  Trim any bushes or trees on the path to your home.  Use bright outdoor lighting.  Clear any walking paths of anything that might make someone trip, such as rocks or tools.  Regularly check to see if handrails are loose or broken. Make sure that both sides of any steps have handrails.  Any raised decks and porches should have guardrails on the edges.  Have any leaves, snow, or ice cleared regularly.  Use sand or salt on walking paths during winter.  Clean up any spills in your garage right away. This includes oil or grease spills. WHAT CAN I DO IN THE BATHROOM?   Use night  lights.  Install grab bars by the toilet and in the tub and shower. Do not use towel bars as grab bars.  Use non-skid mats or decals in the tub or shower.  If you need to sit down in the shower, use a plastic, non-slip stool.  Keep the floor dry. Clean up any water that spills on the floor as soon as it happens.  Remove soap buildup in the tub or shower regularly.  Attach bath mats securely with double-sided non-slip rug tape.  Do not have throw rugs and other things on the floor that can make you trip. WHAT CAN I DO IN THE BEDROOM?  Use night lights.  Make sure that you have a light by your bed that is easy to reach.  Do not use any sheets or blankets that are too big for your bed. They should not hang down onto the floor.  Have a firm chair that has side arms. You can use this for support while you get dressed.  Do not have throw rugs and other things on the floor that can make you trip. WHAT CAN I DO IN THE KITCHEN?  Clean up any spills right away.  Avoid walking on wet floors.  Keep items  that you use a lot in easy-to-reach places.  If you need to reach something above you, use a strong step stool that has a grab bar.  Keep electrical cords out of the way.  Do not use floor polish or wax that makes floors slippery. If you must use wax, use non-skid floor wax.  Do not have throw rugs and other things on the floor that can make you trip. WHAT CAN I DO WITH MY STAIRS?  Do not leave any items on the stairs.  Make sure that there are handrails on both sides of the stairs and use them. Fix handrails that are broken or loose. Make sure that handrails are as long as the stairways.  Check any carpeting to make sure that it is firmly attached to the stairs. Fix any carpet that is loose or worn.  Avoid having throw rugs at the top or bottom of the stairs. If you do have throw rugs, attach them to the floor with carpet tape.  Make sure that you have a light switch at the  top of the stairs and the bottom of the stairs. If you do not have them, ask someone to add them for you. WHAT ELSE CAN I DO TO HELP PREVENT FALLS?  Wear shoes that:  Do not have high heels.  Have rubber bottoms.  Are comfortable and fit you well.  Are closed at the toe. Do not wear sandals.  If you use a stepladder:  Make sure that it is fully opened. Do not climb a closed stepladder.  Make sure that both sides of the stepladder are locked into place.  Ask someone to hold it for you, if possible.  Clearly mark and make sure that you can see:  Any grab bars or handrails.  First and last steps.  Where the edge of each step is.  Use tools that help you move around (mobility aids) if they are needed. These include:  Canes.  Walkers.  Scooters.  Crutches.  Turn on the lights when you go into a dark area. Replace any light bulbs as soon as they burn out.  Set up your furniture so you have a clear path. Avoid moving your furniture around.  If any of your floors are uneven, fix them.  If there are any pets around you, be aware of where they are.  Review your medicines with your doctor. Some medicines can make you feel dizzy. This can increase your chance of falling. Ask your doctor what other things that you can do to help prevent falls.   This information is not intended to replace advice given to you by your health care provider. Make sure you discuss any questions you have with your health care provider.   Document Released: 06/14/2009 Document Revised: 01/02/2015 Document Reviewed: 09/22/2014 Elsevier Interactive Patient Education [employer].

## 2016-05-28 ENCOUNTER — Ambulatory Visit
Admission: RE | Admit: 2016-05-28 | Discharge: 2016-05-28 | Disposition: A | Discharge status: Home / Self Care | Payer: Medicare Other | Source: Ambulatory Visit | Attending: Internal Medicine | Admitting: Internal Medicine

## 2016-05-28 ENCOUNTER — Other Ambulatory Visit: Payer: Self-pay | Admitting: Internal Medicine

## 2016-05-28 DIAGNOSIS — Z1231 Encounter for screening mammogram for malignant neoplasm of breast: Secondary | ICD-10-CM

## 2016-05-28 DIAGNOSIS — Z0000 Encounter for general adult medical examination without abnormal findings: Secondary | ICD-10-CM

## 2016-05-28 LAB — HM MAMMOGRAPHY

## 2016-05-28 NOTE — Progress Notes (Signed)
I have reviewed the above note and agree.  Amulya Quintin, M.D.  

## 2016-05-29 NOTE — Progress Notes (Signed)
Info has been abstracted.

## 2016-06-24 ENCOUNTER — Telehealth: Payer: Self-pay | Admitting: *Deleted

## 2016-06-24 MED ORDER — SIMVASTATIN 20 MG PO TABS: 20.0000 mg | ORAL_TABLET | Freq: Every morning | ORAL | 0 refills | Status: DC

## 2016-06-24 NOTE — Telephone Encounter (Signed)
Pt's husband has requested a medication refill for simvastatin to be [facility] [place] until the Rx is filled by [facility]

## 2016-06-28 IMAGING — CR DG FEMUR 2+V*L*
4 series · 4 of 4 positions shown · non-contrast
Comparison: 02/16/2014 hip and pelvic films.

CLINICAL DATA: 82-year-old female with chronic left hip and left
femur pain for the past year. No known injury. Hip replacement 10
years ago. Initial encounter.

EXAM:
LEFT HIP (WITH PELVIS) 2-3 VIEWS; LEFT FEMUR 2 VIEWS

[view not recorded (1 of 4)]
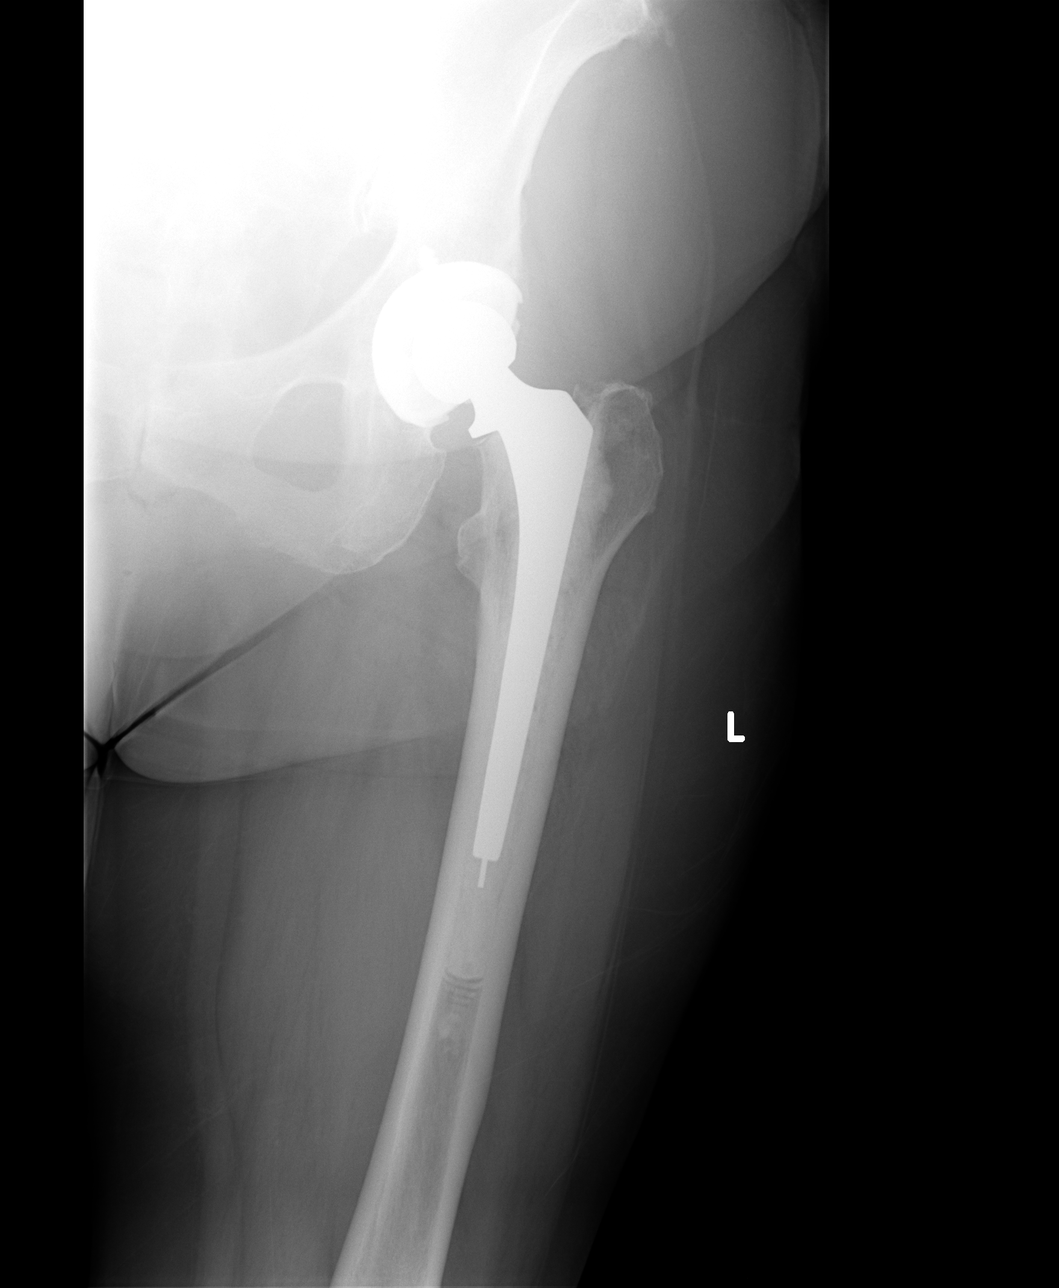

[view not recorded (2 of 4)]
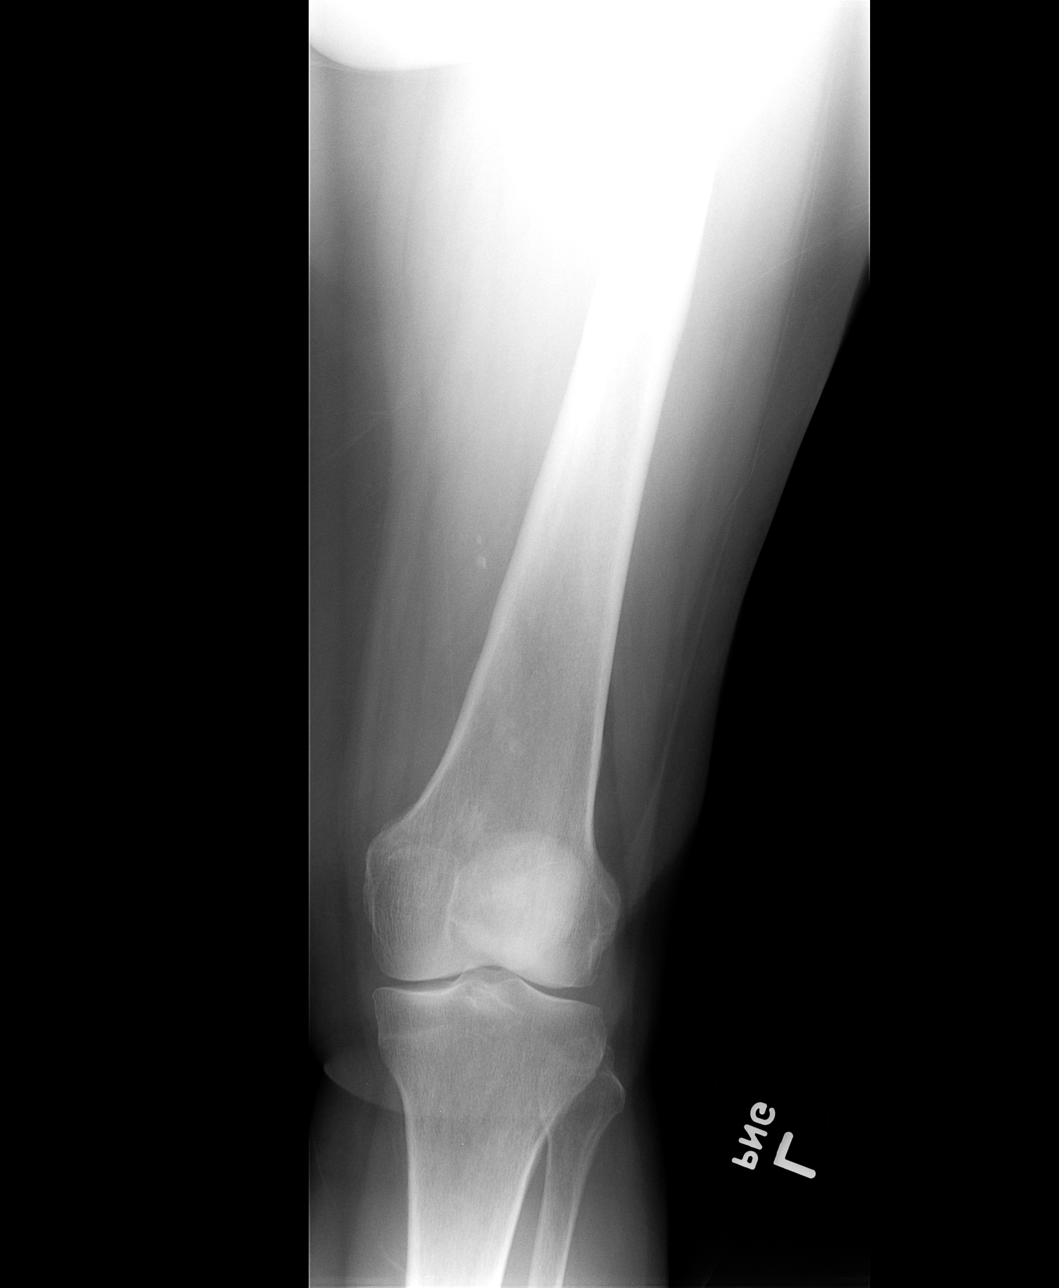

[view not recorded (3 of 4)]
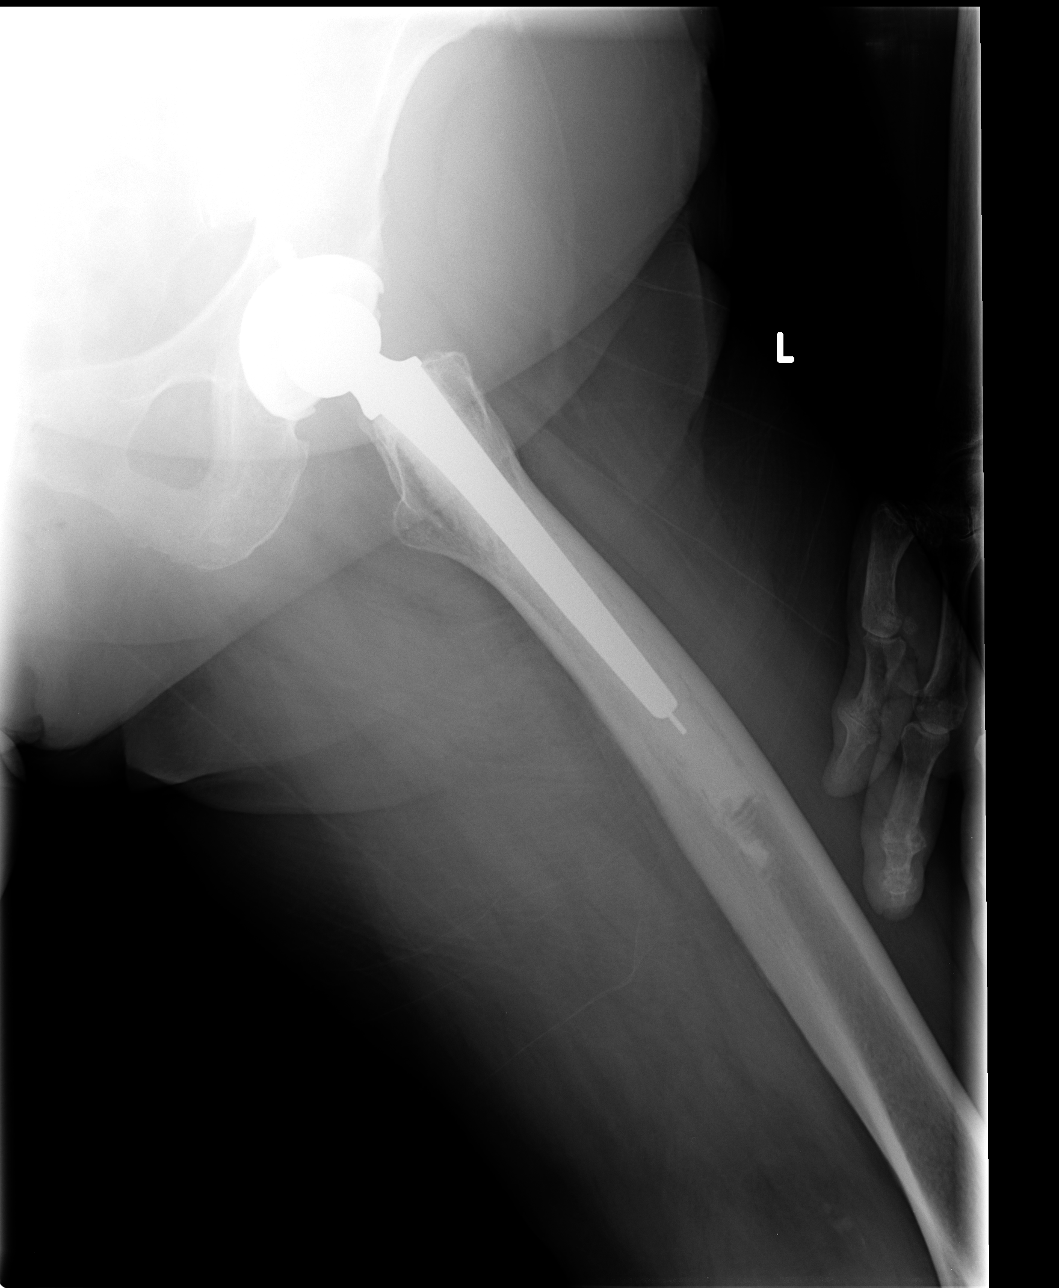

[view not recorded (4 of 4)]
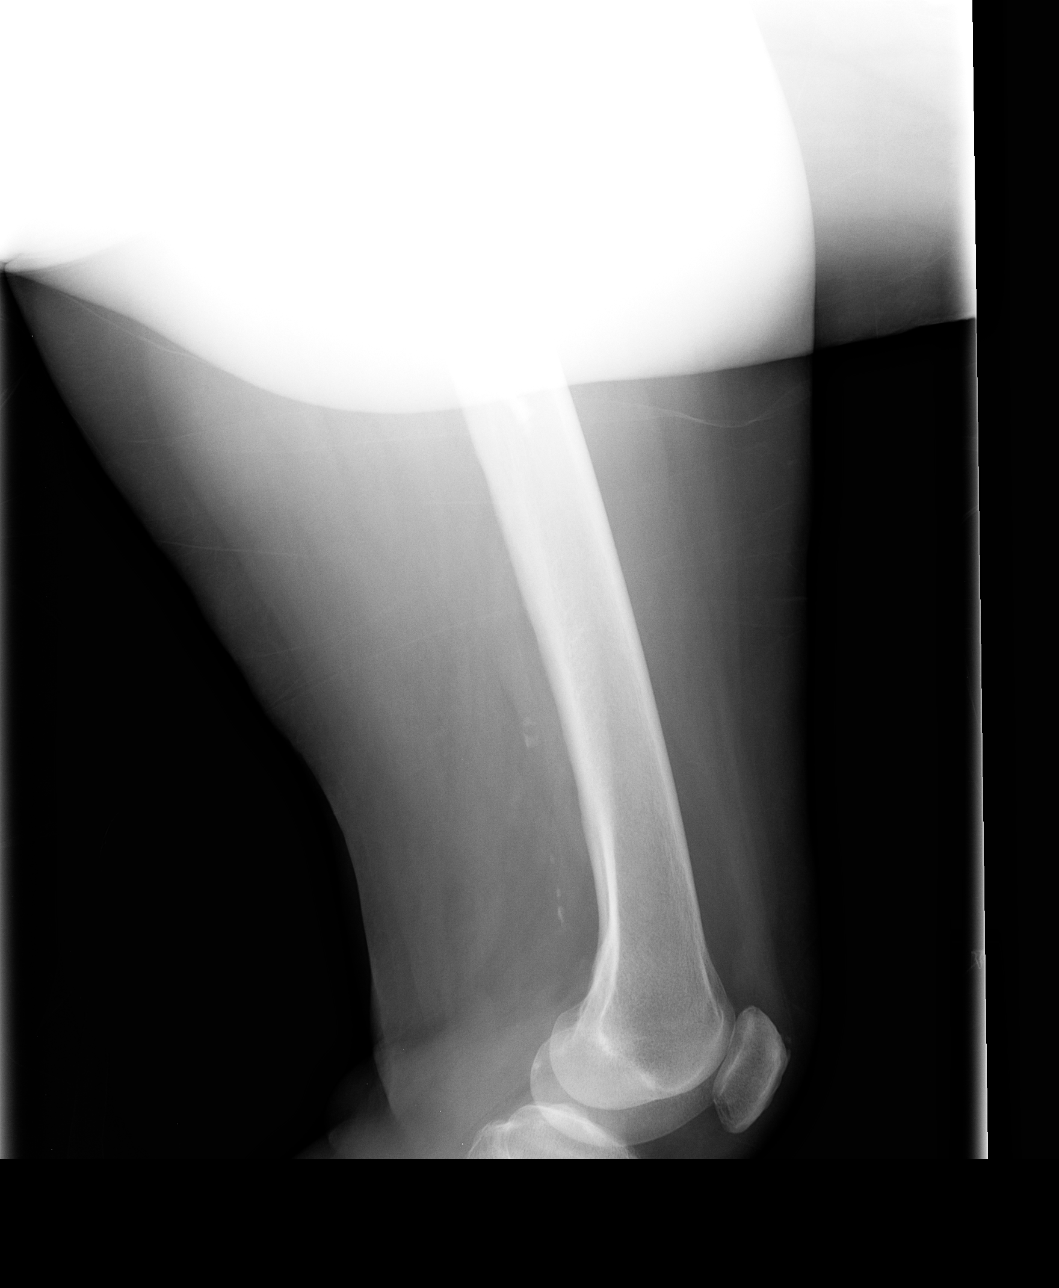

[4 of 4 positions shown; findings below may reference images not displayed]

FINDINGS: Post total left hip replacement without complication identified.

No fracture or dislocation.

Mild left medial tibiofemoral joint space degenerative changes.

Vascular calcifications.
IMPRESSION: Post total left hip replacement without complication identified.

No fracture or dislocation.

Mild left medial tibiofemoral joint space degenerative changes

## 2016-06-28 IMAGING — CR DG HIP (WITH OR WITHOUT PELVIS) 2-3V*L*
2 series · 2 of 2 positions shown · non-contrast
Comparison: 02/16/2014 hip and pelvic films.

CLINICAL DATA: 82-year-old female with chronic left hip and left
femur pain for the past year. No known injury. Hip replacement 10
years ago. Initial encounter.

EXAM:
LEFT HIP (WITH PELVIS) 2-3 VIEWS; LEFT FEMUR 2 VIEWS

[view not recorded (1 of 2)]
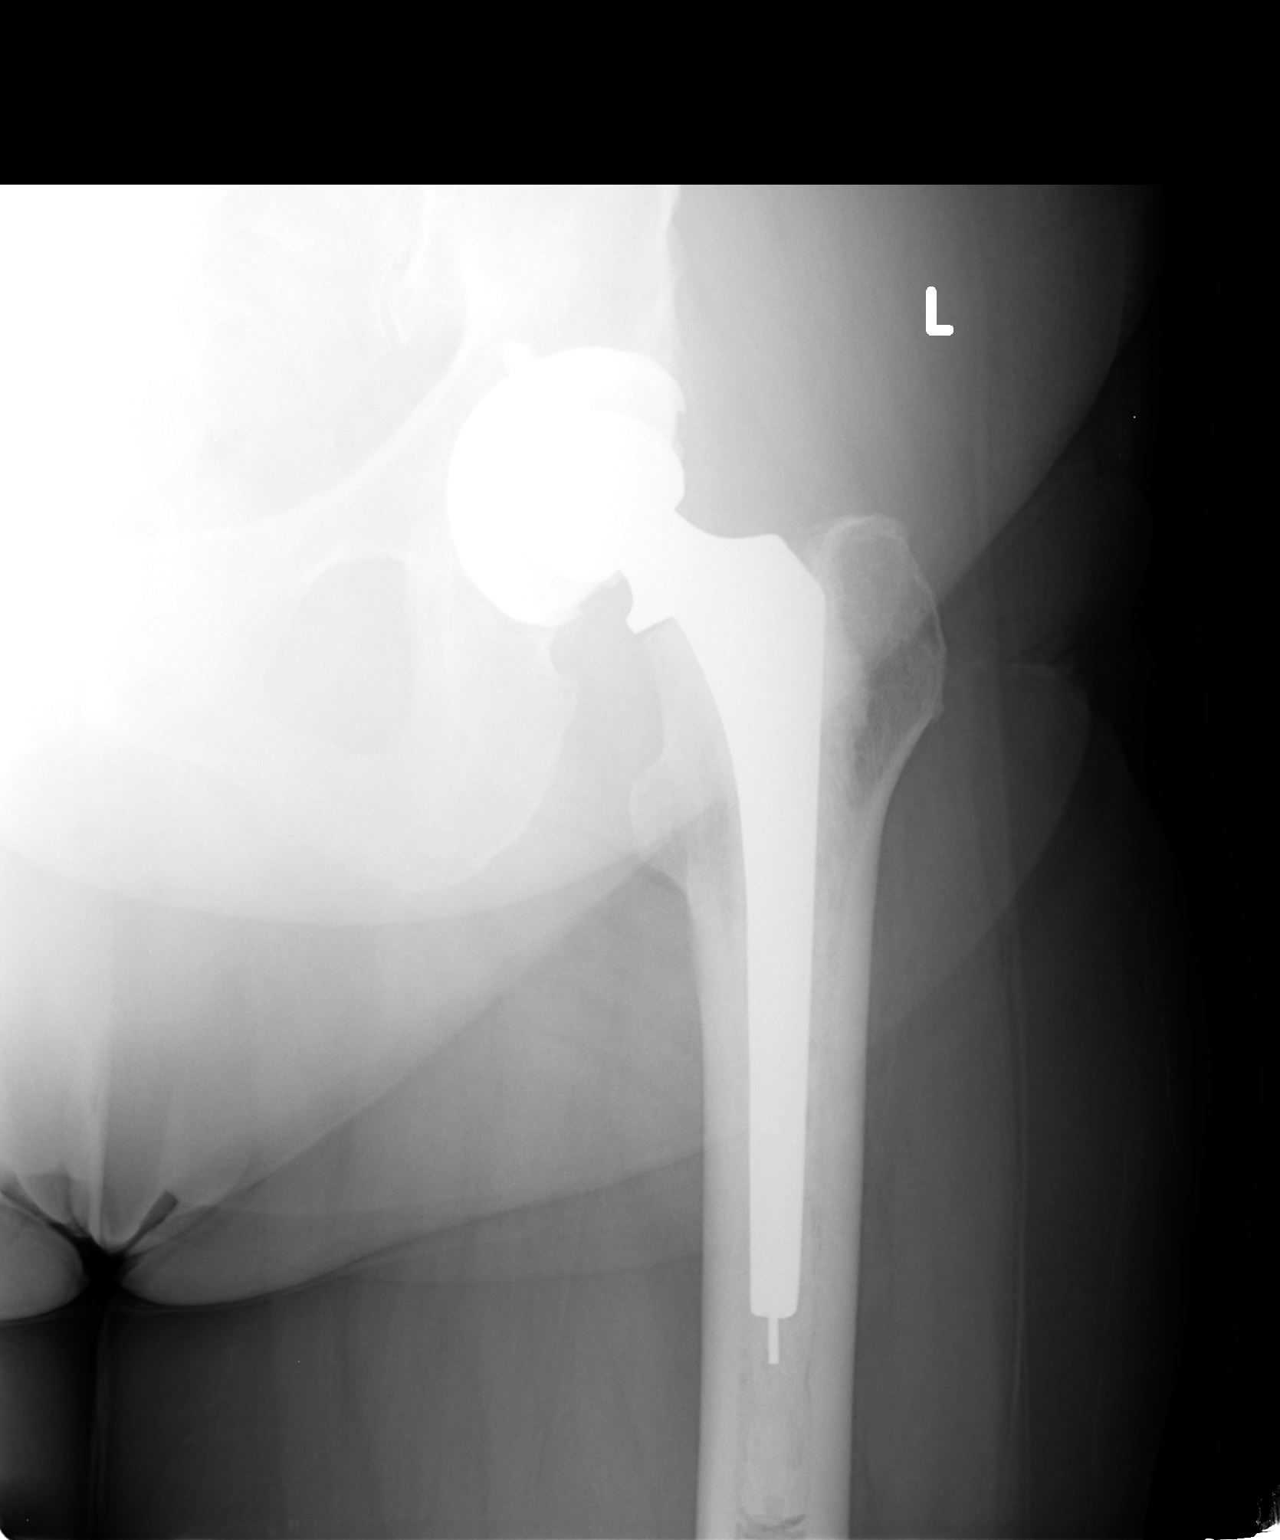

[view not recorded (2 of 2)]
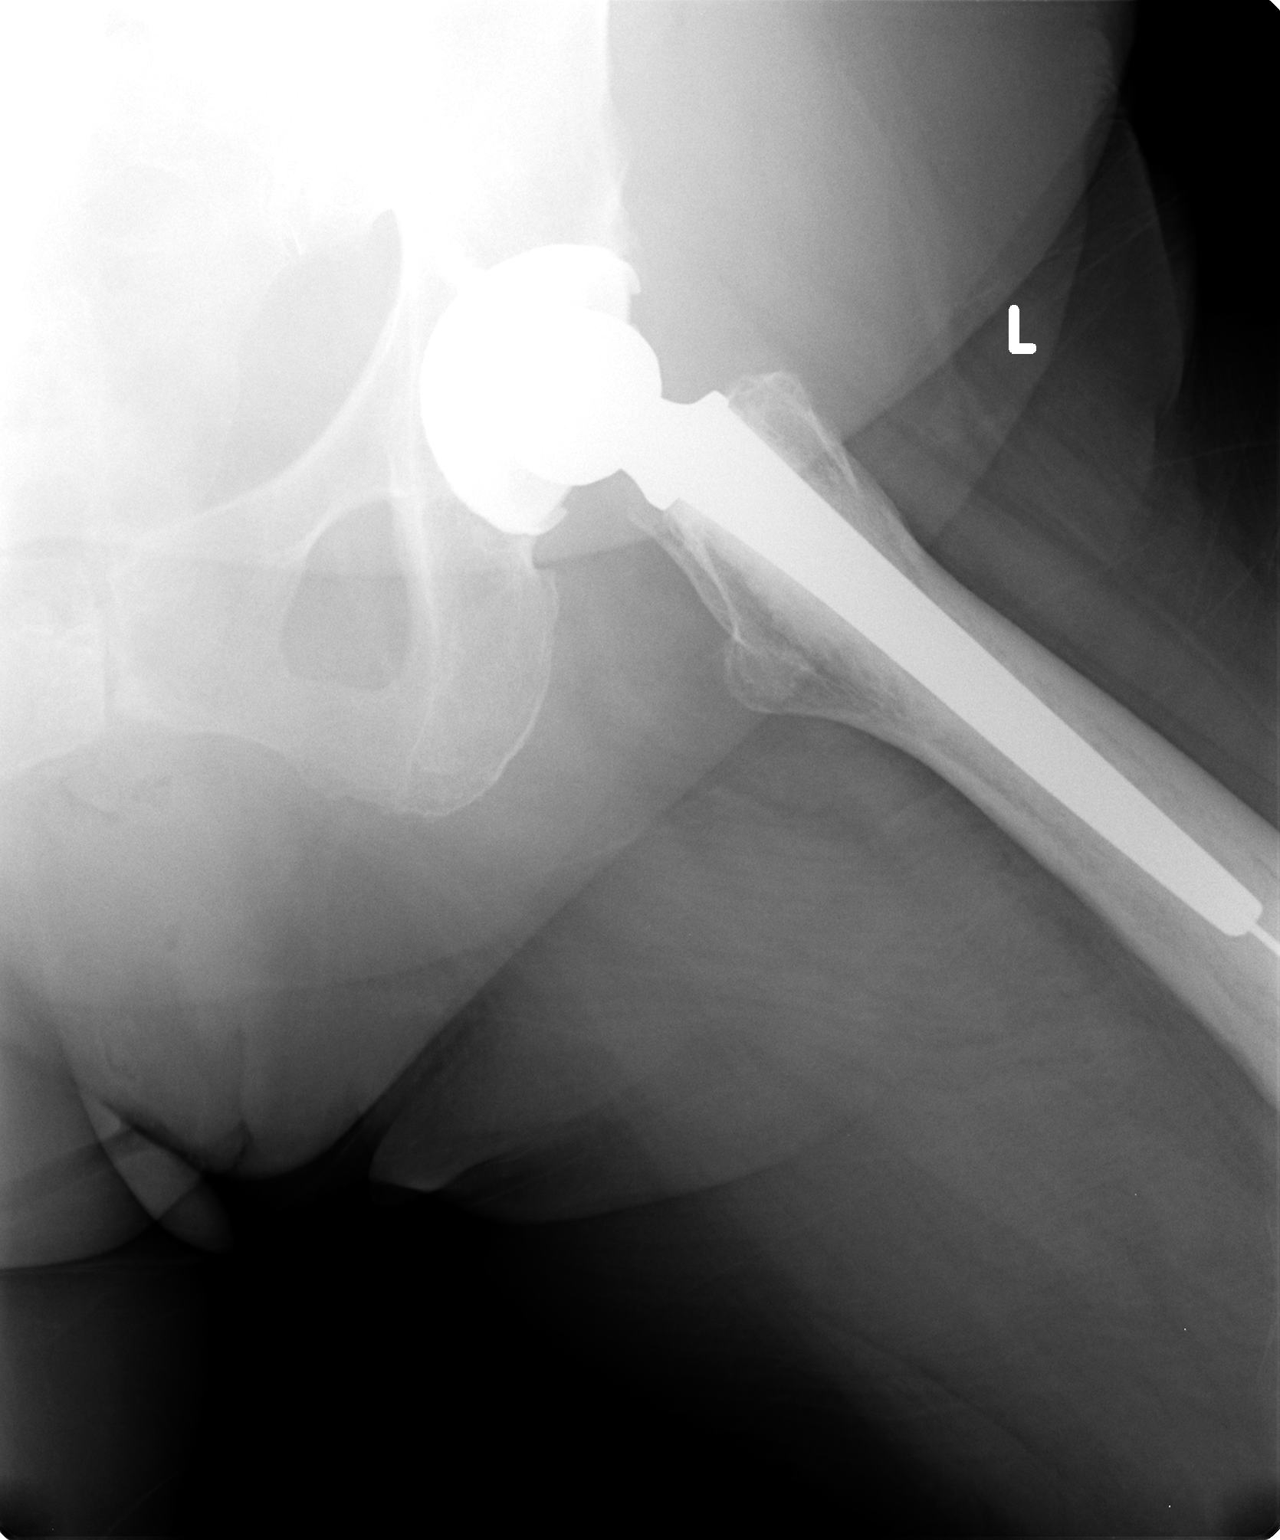

[2 of 2 positions shown; findings below may reference images not displayed]

FINDINGS: Post total left hip replacement without complication identified.

No fracture or dislocation.

Mild left medial tibiofemoral joint space degenerative changes.

Vascular calcifications.
IMPRESSION: Post total left hip replacement without complication identified.

No fracture or dislocation.

Mild left medial tibiofemoral joint space degenerative changes

## 2016-07-09 ENCOUNTER — Other Ambulatory Visit: Payer: Self-pay

## 2016-07-09 MED ORDER — SIMVASTATIN 20 MG PO TABS: 20.0000 mg | ORAL_TABLET | Freq: Every morning | ORAL | 1 refills | Status: DC

## 2016-08-01 ENCOUNTER — Ambulatory Visit: Payer: Medicare Other | Admitting: Family Medicine

## 2016-08-01 DIAGNOSIS — M545 Low back pain: Secondary | ICD-10-CM | POA: Diagnosis not present

## 2016-08-01 DIAGNOSIS — M6281 Muscle weakness (generalized): Secondary | ICD-10-CM | POA: Diagnosis not present

## 2016-08-01 DIAGNOSIS — M461 Sacroiliitis, not elsewhere classified: Secondary | ICD-10-CM | POA: Diagnosis not present

## 2016-08-02 ENCOUNTER — Other Ambulatory Visit: Payer: Self-pay | Admitting: Family Medicine

## 2016-08-04 ENCOUNTER — Encounter: Payer: Self-pay | Admitting: Family Medicine

## 2016-08-04 ENCOUNTER — Ambulatory Visit (INDEPENDENT_AMBULATORY_CARE_PROVIDER_SITE_OTHER): Payer: Medicare Other | Admitting: Family Medicine

## 2016-08-04 VITALS — BP 108/60 | HR 79 | Temp 97.8°F | Wt 152.0 lb

## 2016-08-04 DIAGNOSIS — H539 Unspecified visual disturbance: Secondary | ICD-10-CM | POA: Diagnosis not present

## 2016-08-04 DIAGNOSIS — K219 Gastro-esophageal reflux disease without esophagitis: Secondary | ICD-10-CM

## 2016-08-04 DIAGNOSIS — E785 Hyperlipidemia, unspecified: Secondary | ICD-10-CM | POA: Diagnosis not present

## 2016-08-04 DIAGNOSIS — I10 Essential (primary) hypertension: Secondary | ICD-10-CM | POA: Diagnosis not present

## 2016-08-04 LAB — COMPREHENSIVE METABOLIC PANEL
ALBUMIN: 4.7 g/dL (ref 3.5–5.2)
ALK PHOS: 74 U/L (ref 39–117)
ALT: 17 U/L (ref 0–35)
AST: 21 U/L (ref 0–37)
BILIRUBIN TOTAL: 0.5 mg/dL (ref 0.2–1.2)
BUN: 28 mg/dL — AB (ref 6–23)
CO2: 30 mEq/L (ref 19–32)
Calcium: 10.1 mg/dL (ref 8.4–10.5)
Chloride: 102 mEq/L (ref 96–112)
Creatinine, Ser: 1.22 mg/dL — ABNORMAL HIGH (ref 0.40–1.20)
GFR: 44.88 mL/min — AB (ref >60.00)
GLUCOSE: 93 mg/dL (ref 70–99)
POTASSIUM: 4.9 meq/L (ref 3.5–5.1)
Sodium: 140 mEq/L (ref 135–145)
TOTAL PROTEIN: 7.5 g/dL (ref 6.0–8.3)

## 2016-08-04 MED ORDER — PANTOPRAZOLE SODIUM 40 MG PO TBEC: 40.0000 mg | DELAYED_RELEASE_TABLET | Freq: Every day | ORAL | 1 refills | Status: DC

## 2016-08-04 NOTE — Assessment & Plan Note (Signed)
Tolerating simvastatin. Check LFTs today. 

## 2016-08-04 NOTE — Progress Notes (Signed)
  Tommi Rumps, MD Phone: [phone]  Robin Alexander is a 82 y.o. female who presents today for f/u.  HYPERTENSION  Disease Monitoring  Home BP Monitoring not checking Chest pain- no    Dyspnea- no Medications  Compliance-  taking amlodipine, benazepril.   Edema- no  GERD: Patient notes this is somewhat better. She is taking Protonix. No abdominal pain or blood in her stool. No burning or sour taste. She does note some mild regurgitation sensation. Notes it is better though not resolved.  Hyperlipidemia: Taking simvastatin. No right upper quadrant pain or myalgias. See hypertension for ROS.  Patient does note some intermittently blurry vision over the last year since starting on gabapentin. Notes at times it is better. At times it is a little worse. Notes it is minimal blurriness. No vision loss. No double vision. Notes it is her distance vision.   PMH: Former smoker.   ROS see history of present illness  Objective  Physical Exam Vitals:   08/04/16 1043  BP: 108/60  Pulse: 79  Temp: 97.8 F (36.6 C)    BP Readings from Last 3 Encounters:  08/04/16 108/60  05/23/16 120/68  05/02/16 138/66   Wt Readings from Last 3 Encounters:  08/04/16 152 lb (68.9 kg)  05/23/16 151 lb 12.8 oz (68.9 kg)  05/02/16 153 lb 3.2 oz (69.5 kg)    Physical Exam  Constitutional: No distress.  HENT:  Head: Normocephalic and atraumatic.  Eyes: Conjunctivae are normal. Pupils are equal, round, and reactive to light.  Cardiovascular: Normal rate, regular rhythm and normal heart sounds.   Pulmonary/Chest: Effort normal and breath sounds normal.  Abdominal: Soft. Bowel sounds are normal. She exhibits no distension. There is no tenderness. There is no rebound and no guarding.  Musculoskeletal: She exhibits no edema.  Neurological: She is alert. Gait normal.  Skin: Skin is warm and dry. She is not diaphoretic.     Assessment/Plan: Please see individual problem list.  GERD  (gastroesophageal reflux disease) Improved control although still has some symptoms. She will continue Protonix. We'll refer to GI for further evaluation.  Hypertension At goal today. Continue current medications. Check CMP today.  Hyperlipidemia Tolerating simvastatin. Check LFTs today.  Vision changes She reports slightly blurry vision over the last year since starting on gabapentin. Suspect related to this medication though we will have her follow-up with her ophthalmologist for evaluation. Vision check today stable.   Orders Placed This Encounter  Procedures  . Comp Met (CMET)  . Ambulatory referral to Gastroenterology    Referral Priority:   Routine    Referral Type:   Consultation    Referral Reason:   Specialty Services Required    Number of Visits Requested:   1    Meds ordered this encounter  Medications  . pantoprazole (PROTONIX) 40 MG tablet    Sig: Take 1 tablet (40 mg total) by mouth daily.    Dispense:  90 tablet    Refill:  1    Tommi Rumps, MD [place]

## 2016-08-04 NOTE — Assessment & Plan Note (Signed)
Improved control although still has some symptoms. She will continue Protonix. We'll refer to GI for further evaluation.

## 2016-08-04 NOTE — Assessment & Plan Note (Addendum)
She reports slightly blurry vision over the last year since starting on gabapentin. Suspect related to this medication though we will have her follow-up with her ophthalmologist for evaluation. Vision check today stable.

## 2016-08-04 NOTE — Assessment & Plan Note (Signed)
At goal today. Continue current medications. Check CMP today.

## 2016-08-04 NOTE — Telephone Encounter (Signed)
Refill denied. Pt has not been seen in over a year.  

## 2016-08-04 NOTE — Patient Instructions (Signed)
Nice to see you. We are going to refer you to GI for your reflux. You should continue the Protonix. Please set up an appointment with your eye doctor to have your vision evaluated. Please continue your blood pressure and cholesterol medications. We will check some lab work today and call you with the results. If you develop worsening vision issues or any new or changing symptoms please seek medical attention medially.

## 2016-08-04 NOTE — Progress Notes (Signed)
Pre visit review using our clinic review tool, if applicable. No additional management support is needed unless otherwise documented below in the visit note. 

## 2016-08-08 ENCOUNTER — Other Ambulatory Visit: Payer: Self-pay | Admitting: Family Medicine

## 2016-08-08 DIAGNOSIS — N183 Chronic kidney disease, stage 3 unspecified: Secondary | ICD-10-CM

## 2016-08-18 DIAGNOSIS — I129 Hypertensive chronic kidney disease with stage 1 through stage 4 chronic kidney disease, or unspecified chronic kidney disease: Secondary | ICD-10-CM | POA: Diagnosis not present

## 2016-08-18 DIAGNOSIS — N183 Chronic kidney disease, stage 3 (moderate): Secondary | ICD-10-CM | POA: Diagnosis not present

## 2016-08-18 DIAGNOSIS — I10 Essential (primary) hypertension: Secondary | ICD-10-CM | POA: Diagnosis not present

## 2016-08-18 DIAGNOSIS — N390 Urinary tract infection, site not specified: Secondary | ICD-10-CM | POA: Diagnosis not present

## 2016-08-22 ENCOUNTER — Other Ambulatory Visit: Payer: Self-pay | Admitting: Family Medicine

## 2016-08-26 NOTE — Telephone Encounter (Signed)
Pt has not been seen in over a year. Refill denied.  

## 2016-09-02 ENCOUNTER — Ambulatory Visit: Payer: Self-pay | Admitting: Gastroenterology

## 2016-09-03 ENCOUNTER — Ambulatory Visit: Payer: Self-pay | Admitting: Gastroenterology

## 2016-09-03 ENCOUNTER — Ambulatory Visit: Payer: Medicare Other | Admitting: Gastroenterology

## 2016-09-03 ENCOUNTER — Other Ambulatory Visit: Payer: Self-pay | Admitting: *Deleted

## 2016-09-10 ENCOUNTER — Encounter: Payer: Self-pay | Admitting: Gastroenterology

## 2016-09-10 ENCOUNTER — Ambulatory Visit (INDEPENDENT_AMBULATORY_CARE_PROVIDER_SITE_OTHER): Payer: Medicare Other | Admitting: Gastroenterology

## 2016-09-10 VITALS — BP 118/78 | HR 84 | Temp 98.2°F | Ht 60.0 in | Wt 150.0 lb

## 2016-09-10 DIAGNOSIS — K219 Gastro-esophageal reflux disease without esophagitis: Secondary | ICD-10-CM

## 2016-09-10 MED ORDER — PANTOPRAZOLE SODIUM 20 MG PO TBEC: 20.0000 mg | DELAYED_RELEASE_TABLET | Freq: Every day | ORAL | 0 refills | Status: DC

## 2016-09-10 NOTE — Progress Notes (Signed)
Gastroenterology Consultation  Referring Provider:     Leone Haven, MD Primary Care Physician:  Tommi Rumps, MD Primary Gastroenterologist:  Dr. Jonathon Bellows  Reason for Consultation:     GERD        HPI:   Robin Alexander is a 83 y.o. y/o female referred for consultation & management  by Dr. Tommi Rumps, MD.    She has been referred for GERD.  Reflux:  Onset : She says she has had reflux for 6 months  Symptoms: when she eats she says it was burning and after she started pantoprazole it resolved. No issues swallowing . No issues at night  Recent weight gain: no  Medications: no  Narcotics or anticholinergics use : no  PPI /H2 blockers or Antacid  use and timing :pantoprazole - takes it after meals BID Dinner time : 5.30 - 6 pm - 10.30is here bed time and in between she site on her recliner  Prior EGD: no  Family history of esophageal cancer:no     Past Medical History:  Diagnosis Date  . Arthritis   . Chicken pox   . GERD (gastroesophageal reflux disease)   . Heart murmur   . High cholesterol   . HTN (hypertension)   . Pneumonia    spring 2015    Past Surgical History:  Procedure Laterality Date  . ABDOMINAL HYSTERECTOMY     in 50's  . ANTERIOR LAT LUMBAR FUSION Right 02/28/2015   Procedure: ANTERIOR LATERAL LUMBAR FUSION 1 LEVEL;  Surgeon: Phylliss Bob, MD;  Location: [facility];  Service: Orthopedics;  Laterality: Right;  Right sided lumbar 3-4 lateral interbody fusion  . APPENDECTOMY  1950  . CATARACT EXTRACTION W/ INTRAOCULAR LENS  IMPLANT, BILATERAL    . CESAREAN SECTION     2  . ROTATOR CUFF REPAIR     right shoulder   10 YRS  . TOTAL HIP ARTHROPLASTY  2005 & 2006   Bilateral, [place]    Prior to Admission medications   Medication Sig Start Date End Date Taking? Authorizing Provider  allopurinol (ZYLOPRIM) 100 MG tablet TAKE 1 TABLET BY MOUTH 2  TIMES DAILY. NEED  APPOINTMENT FOR FUTURE  REFILLS 04/24/16  Yes Lyndal Pulley, DO    amLODipine-benazepril (LOTREL) 5-20 MG capsule Take 1 capsule by mouth  daily 03/11/16  Yes Jackolyn Confer, MD  aspirin EC 81 MG tablet Take 81 mg by mouth daily.     Yes Historical Provider, MD  cholecalciferol (VITAMIN D) 1000 UNITS tablet Take 1,000 Units by mouth daily.     Yes Historical Provider, MD  diphenhydrAMINE (BENADRYL) 25 MG tablet Take 25 mg by mouth daily as needed for allergies.    Yes Historical Provider, MD  Multiple Vitamin (MULTIVITAMIN) capsule Take 1 capsule by mouth daily.     Yes Historical Provider, MD  pantoprazole (PROTONIX) 40 MG tablet TAKE 1 TABLET BY MOUTH  TWICE A DAY BEFORE MEAL 08/22/16  Yes Leone Haven, MD  Sennosides-Docusate Sodium ([employer] STOOL SOFTENER/LAXATIVE PO) Take 1 capsule by mouth daily as needed.    Yes Historical Provider, MD  simvastatin (ZOCOR) 20 MG tablet Take 1 tablet (20 mg total) by mouth every morning. 07/09/16  Yes Leone Haven, MD  traMADol (ULTRAM) 50 MG tablet TK 1 T PO Q 6 H PRN P 06/05/16  Yes Historical Provider, MD  Turmeric 500 MG CAPS Take 1 capsule by mouth daily.   Yes Historical Provider, MD  Family History  Problem Relation Age of Onset  . Arthritis Mother   . Heart disease Mother   . Arthritis Father   . Stroke Brother   . Heart disease Brother   . Cancer Neg Hx      Social History  Substance Use Topics  . Smoking status: Former Smoker    Packs/day: 0.75    Years: 20.00    Types: Cigarettes    Quit date: 09/02/1983  . Smokeless tobacco: Never Used  . Alcohol use No     Comment: Occasional wine    Allergies as of 09/10/2016 - Review Complete 09/10/2016  Allergen Reaction Noted  . Latex  03/20/2014  . Codeine Itching 12/26/2011    Review of Systems:    All systems reviewed and negative except where noted in HPI.   Physical Exam:  BP 118/78   Pulse 84   Temp 98.2 F (36.8 C)   Ht 5' (1.524 m)   Wt 150 lb (68 kg)   BMI 29.29 kg/m  No LMP recorded. Patient has had a hysterectomy. Psych:   Alert and cooperative. Normal mood and affect. General:   Alert,  Well-developed, well-nourished, pleasant and cooperative in NAD Head:  Normocephalic and atraumatic. Eyes:  Sclera clear, no icterus.   Conjunctiva pink. Ears:  Normal auditory acuity. Nose:  No deformity, discharge, or lesions. Mouth:  No deformity or lesions,oropharynx pink & moist. Neck:  Supple; no masses or thyromegaly. Lungs:  Respirations even and unlabored.  Clear throughout to auscultation.   No wheezes, crackles, or rhonchi. No acute distress. Heart:  Regular rate and rhythm; no murmurs, clicks, rubs, or gallops. Abdomen:  Normal bowel sounds.  No bruits.  Soft, non-tender and non-distended without masses, hepatosplenomegaly or hernias noted.  No guarding or rebound tenderness.    Msk:  Symmetrical without gross deformities. Good, equal movement & strength bilaterally. Pulses:  Normal pulses noted. Extremities:  No clubbing or edema.  No cyanosis. Psych:  Alert and cooperative. Normal mood and affect.  Imaging Studies: No results found.  Assessment and Plan:   Robin Alexander is a 83 y.o. y/o female has been referred for GERD. She is on protonix BID dose which she takes after meals and presently has no symptoms . I suggested she take a low dose before meals . IF she does well on that , will plan to transition her to Zantac due to the side effects associated with long term PPI yuse.   Counseled on life style changes, suggest to use PPI first thing in the morning on empty stomach and eat 30 minutes after. Advised on the use of a wedge pillow at night , avoid meals for 2 hours prior to bed time.   Discussed the risks and benefits of long term PPI use including but not limited to bone loss, chronic kidney disease, infections , low magnesium . Aim to use at the lowest dose for the shortest period of time    Follow up in 3 months  Dr Jonathon Bellows MD

## 2016-09-10 NOTE — Patient Instructions (Signed)
Gastroesophageal Reflux Disease, Adult Introduction Normally, food travels down the esophagus and stays in the stomach to be digested. If a person has gastroesophageal reflux disease (GERD), food and stomach acid move back up into the esophagus. When this happens, the esophagus becomes sore and swollen (inflamed). Over time, GERD can make small holes (ulcers) in the lining of the esophagus. Follow these instructions at home: Diet  Follow a diet as told by your doctor. You may need to avoid foods and drinks such as:  Coffee and tea (with or without caffeine).  Drinks that contain alcohol.  Energy drinks and sports drinks.  Carbonated drinks or sodas.  Chocolate and cocoa.  Peppermint and mint flavorings.  Garlic and onions.  Horseradish.  Spicy and acidic foods, such as peppers, chili powder, curry powder, vinegar, hot sauces, and BBQ sauce.  Citrus fruit juices and citrus fruits, such as oranges, lemons, and limes.  Tomato-based foods, such as red sauce, chili, salsa, and pizza with red sauce.  Fried and fatty foods, such as donuts, french fries, potato chips, and high-fat dressings.  High-fat meats, such as hot dogs, rib eye steak, sausage, ham, and bacon.  High-fat dairy items, such as whole milk, butter, and cream cheese.  Eat small meals often. Avoid eating large meals.  Avoid drinking large amounts of liquid with your meals.  Avoid eating meals during the 2-3 hours before bedtime.  Avoid lying down right after you eat.  Do not exercise right after you eat. General instructions  Pay attention to any changes in your symptoms.  Take over-the-counter and prescription medicines only as told by your doctor. Do not take aspirin, ibuprofen, or other NSAIDs unless your doctor says it is okay.  Do not use any tobacco products, including cigarettes, chewing tobacco, and e-cigarettes. If you need help quitting, ask your doctor.  Wear loose clothes. Do not wear anything  tight around your waist.  Raise (elevate) the head of your bed about 6 inches (15 cm).  Try to lower your stress. If you need help doing this, ask your doctor.  If you are overweight, lose an amount of weight that is healthy for you. Ask your doctor about a safe weight loss goal.  Keep all follow-up visits as told by your doctor. This is important. Contact a doctor if:  You have new symptoms.  You lose weight and you do not know why it is happening.  You have trouble swallowing, or it hurts to swallow.  You have wheezing or a cough that keeps happening.  Your symptoms do not get better with treatment.  You have a hoarse voice. Get help right away if:  You have pain in your arms, neck, jaw, teeth, or back.  You feel sweaty, dizzy, or light-headed.  You have chest pain or shortness of breath.  You throw up (vomit) and your throw up looks like blood or coffee grounds.  You pass out (faint).  Your poop (stool) is bloody or black.  You cannot swallow, drink, or eat. This information is not intended to replace advice given to you by your health care provider. Make sure you discuss any questions you have with your health care provider. Document Released: 02/04/2008 Document Revised: 01/24/2016 Document Reviewed: 12/13/2014  2017 Elsevier  Gastroesophageal Reflux Disease, Adult Introduction Normally, food travels down the esophagus and stays in the stomach to be digested. If a person has gastroesophageal reflux disease (GERD), food and stomach acid move back up into the esophagus. When this happens,  the esophagus becomes sore and swollen (inflamed). Over time, GERD can make small holes (ulcers) in the lining of the esophagus. Follow these instructions at home: Diet  Follow a diet as told by your doctor. You may need to avoid foods and drinks such as:  Coffee and tea (with or without caffeine).  Drinks that contain alcohol.  Energy drinks and sports drinks.  Carbonated  drinks or sodas.  Chocolate and cocoa.  Peppermint and mint flavorings.  Garlic and onions.  Horseradish.  Spicy and acidic foods, such as peppers, chili powder, curry powder, vinegar, hot sauces, and BBQ sauce.  Citrus fruit juices and citrus fruits, such as oranges, lemons, and limes.  Tomato-based foods, such as red sauce, chili, salsa, and pizza with red sauce.  Fried and fatty foods, such as donuts, french fries, potato chips, and high-fat dressings.  High-fat meats, such as hot dogs, rib eye steak, sausage, ham, and bacon.  High-fat dairy items, such as whole milk, butter, and cream cheese.  Eat small meals often. Avoid eating large meals.  Avoid drinking large amounts of liquid with your meals.  Avoid eating meals during the 2-3 hours before bedtime.  Avoid lying down right after you eat.  Do not exercise right after you eat. General instructions  Pay attention to any changes in your symptoms.  Take over-the-counter and prescription medicines only as told by your doctor. Do not take aspirin, ibuprofen, or other NSAIDs unless your doctor says it is okay.  Do not use any tobacco products, including cigarettes, chewing tobacco, and e-cigarettes. If you need help quitting, ask your doctor.  Wear loose clothes. Do not wear anything tight around your waist.  Raise (elevate) the head of your bed about 6 inches (15 cm).  Try to lower your stress. If you need help doing this, ask your doctor.  If you are overweight, lose an amount of weight that is healthy for you. Ask your doctor about a safe weight loss goal.  Keep all follow-up visits as told by your doctor. This is important. Contact a doctor if:  You have new symptoms.  You lose weight and you do not know why it is happening.  You have trouble swallowing, or it hurts to swallow.  You have wheezing or a cough that keeps happening.  Your symptoms do not get better with treatment.  You have a hoarse  voice. Get help right away if:  You have pain in your arms, neck, jaw, teeth, or back.  You feel sweaty, dizzy, or light-headed.  You have chest pain or shortness of breath.  You throw up (vomit) and your throw up looks like blood or coffee grounds.  You pass out (faint).  Your poop (stool) is bloody or black.  You cannot swallow, drink, or eat. This information is not intended to replace advice given to you by your health care provider. Make sure you discuss any questions you have with your health care provider. Document Released: 02/04/2008 Document Revised: 01/24/2016 Document Reviewed: 12/13/2014  2017 Elsevier

## 2016-09-11 ENCOUNTER — Other Ambulatory Visit: Payer: Self-pay

## 2016-09-11 MED ORDER — PANTOPRAZOLE SODIUM 20 MG PO TBEC: 20.0000 mg | DELAYED_RELEASE_TABLET | Freq: Every day | ORAL | 3 refills | Status: DC

## 2016-09-20 ENCOUNTER — Other Ambulatory Visit: Payer: Self-pay | Admitting: Family Medicine

## 2016-09-22 DIAGNOSIS — Z961 Presence of intraocular lens: Secondary | ICD-10-CM | POA: Diagnosis not present

## 2016-09-22 DIAGNOSIS — H04202 Unspecified epiphora, left lacrimal gland: Secondary | ICD-10-CM | POA: Diagnosis not present

## 2016-09-22 NOTE — Telephone Encounter (Signed)
Refill denied. Pt needs an appt. 

## 2016-10-02 ENCOUNTER — Ambulatory Visit (INDEPENDENT_AMBULATORY_CARE_PROVIDER_SITE_OTHER): Payer: Medicare Other | Admitting: Cardiovascular Disease

## 2016-10-02 ENCOUNTER — Encounter: Payer: Self-pay | Admitting: Cardiovascular Disease

## 2016-10-02 VITALS — BP 132/62 | HR 77 | Ht 60.0 in | Wt 152.2 lb

## 2016-10-02 DIAGNOSIS — I10 Essential (primary) hypertension: Secondary | ICD-10-CM

## 2016-10-02 DIAGNOSIS — I350 Nonrheumatic aortic (valve) stenosis: Secondary | ICD-10-CM

## 2016-10-02 DIAGNOSIS — E785 Hyperlipidemia, unspecified: Secondary | ICD-10-CM | POA: Diagnosis not present

## 2016-10-02 NOTE — Patient Instructions (Signed)
Medication Instructions:  Your physician recommends that you continue on your current medications as directed. Please refer to the Current Medication list given to you today.   Labwork: NONE   Testing/Procedures: Your physician has requested that you have an echocardiogram IN 6 MONTHS. Echocardiography is a painless test that uses sound waves to create images of your heart. It provides your doctor with information about the size and shape of your heart and how well your heart's chambers and valves are working. This procedure takes approximately one hour. There are no restrictions for this procedure.    Follow-Up: Your physician wants you to follow-up in: [place].  You will receive a reminder letter in the mail two months in advance. If you don't receive a letter, please call our office to schedule the follow-up appointment.   If you need a refill on your cardiac medications before your next appointment, please call your pharmacy.

## 2016-10-02 NOTE — Progress Notes (Signed)
Cardiology Office Note   Date:  10/02/2016   ID:  Robin Alexander, DOB Sep 06, 1932, MRN [id number]  PCP:  Robin Rumps, MD  Cardiologist:   Kathlyn Sacramento, MD   Chief Complaint  Patient presents with  . other    45mo f/u. Pt states she is doing well. Reviewed meds with pt verbally.      History of Present Illness: Robin Alexander is a 83 y.o. female who presents for  a follow up visit regarding  aortic stenosis.  She has known history of hypertension, hyperlipidemia and gastroesophageal reflux disease. She has right bundle branch block on her EKG.  Echo in 06/2013 showed: Normal LV systolic function, mild LVH, mild aortic stenosis and regurgitation, mild mitral regurgitation and mildly dilated left atrium . mean aortic valve gradient was 13 mmHg   treadmill stress test in October of 2014 showed no evidence of ischemia.  Most recent echocardiogram in December 2016 showed an EF of 60-65%, moderate aortic stenosis with a mean gradient of 25 mmHg and valve area of 1.08 cm. There was also moderate mitral stenosis and moderate regurgitation. Mean gradient was 9 mmHg with a valve area of 1.38. Systolic pulmonary pressure was 50 mmHg. I reviewed her echo images and the valve does not appear rheumatic. The leaflets were moderately thickened and calcified.  The elevated gradient was partially due to regurgitation and high flow. She has been doing very well and denies any chest pain, shortness of breath, palpitations or dizziness. She is very active.  Past Medical History:  Diagnosis Date  . Arthritis   . Chicken pox   . GERD (gastroesophageal reflux disease)   . Heart murmur   . High cholesterol   . HTN (hypertension)   . Pneumonia    spring 2015    Past Surgical History:  Procedure Laterality Date  . ABDOMINAL HYSTERECTOMY     in 50's  . ANTERIOR LAT LUMBAR FUSION Right 02/28/2015   Procedure: ANTERIOR LATERAL LUMBAR FUSION 1 LEVEL;  Surgeon: Phylliss Bob, MD;  Location: [facility];   Service: Orthopedics;  Laterality: Right;  Right sided lumbar 3-4 lateral interbody fusion  . APPENDECTOMY  1950  . CATARACT EXTRACTION W/ INTRAOCULAR LENS  IMPLANT, BILATERAL    . CESAREAN SECTION     2  . ROTATOR CUFF REPAIR     right shoulder   10 YRS  . TOTAL HIP ARTHROPLASTY  2005 & 2006   Bilateral, [place]     Current Outpatient Prescriptions  Medication Sig Dispense Refill  . allopurinol (ZYLOPRIM) 100 MG tablet TAKE 1 TABLET BY MOUTH 2  TIMES DAILY. NEED  APPOINTMENT FOR FUTURE  REFILLS 180 tablet 0  . amLODipine-benazepril (LOTREL) 5-20 MG capsule Take 1 capsule by mouth  daily 90 capsule 3  . aspirin EC 81 MG tablet Take 81 mg by mouth daily.      . cholecalciferol (VITAMIN D) 1000 UNITS tablet Take 1,000 Units by mouth daily.      . diphenhydrAMINE (BENADRYL) 25 MG tablet Take 25 mg by mouth daily as needed for allergies.     . Multiple Vitamin (MULTIVITAMIN) capsule Take 1 capsule by mouth daily.      . pantoprazole (PROTONIX) 20 MG tablet Take 1 tablet (20 mg total) by mouth daily. 30 tablet 3  . Sennosides-Docusate Sodium ([employer]S STOOL SOFTENER/LAXATIVE PO) Take 1 capsule by mouth daily as needed.     . simvastatin (ZOCOR) 20 MG tablet Take 1 tablet (20 mg  total) by mouth every morning. 90 tablet 1  . Turmeric 500 MG CAPS Take 1 capsule by mouth daily.     No current facility-administered medications for this visit.     Allergies:   Latex and Codeine    Social History:  The patient  reports that she quit smoking about 33 years ago. Her smoking use included Cigarettes. She has a 15.00 pack-year smoking history. She has never used smokeless tobacco. She reports that she does not drink alcohol or use drugs.   Family History:  The patient's family history includes Arthritis in her father and mother; Heart disease in her brother and mother; Stroke in her brother.    ROS:  Please see the history of present illness.   Otherwise, review of systems are positive for none.    All other systems are reviewed and negative.    PHYSICAL EXAM: VS:  BP 132/62 (BP Location: Left Arm, Patient Position: Sitting, Cuff Size: Normal)   Pulse 77   Ht 5' (1.524 m)   Wt 152 lb 4 oz (69.1 kg)   BMI 29.73 kg/m  , BMI Body mass index is 29.73 kg/m. GEN: Well nourished, well developed, in no acute distress  HEENT: normal  Neck: no JVD, carotid bruits, or masses Cardiac: RRR; no  rubs, or gallops,no edema . 3/6 crescendo decrescendo systolic murmur in the aortic area which is mid peaking. Respiratory:  clear to auscultation bilaterally, normal work of breathing GI: soft, nontender, nondistended, + BS MS: no deformity or atrophy  Skin: warm and dry, no rash Neuro:  Strength and sensation are intact Psych: euthymic mood, full affect   EKG:  EKG is ordered today. The ekg ordered today demonstrates normal sinus rhythm with right bundle branch block.   Recent Labs: 08/04/2016: ALT 17; BUN 28; Creatinine, Ser 1.22; Potassium 4.9; Sodium 140    Lipid Panel    Component Value Date/Time   CHOL 144 03/11/2016 0902   TRIG 100.0 03/11/2016 0902   HDL 45.90 03/11/2016 0902   CHOLHDL 3 03/11/2016 0902   VLDL 20.0 03/11/2016 0902   LDLCALC 78 03/11/2016 0902      Wt Readings from Last 3 Encounters:  10/02/16 152 lb 4 oz (69.1 kg)  09/10/16 150 lb (68 kg)  08/04/16 152 lb (68.9 kg)       No flowsheet data found.    ASSESSMENT AND PLAN:  1.   Moderate aortic valve stenosis: She is doing well and has no symptoms related to this.  I recommend a repeat echocardiogram in 6 months. I discussed with her the symptoms associated with aortic stenosis.  2. Moderate mitral regurgitation with at least mild stenosis: I don't hear a mitral regurgitation murmur by physical exam. Echocardiogram in 6 months as outlined above.  3. Hyperlipidemia: Currently on simvastatin 20 mg daily. I reviewed most recent lipid profile in July which showed an LDL of 78.  4. Essential hypertension:  Blood pressure is controlled on current medications.  Disposition:   FU with me in 1 year  Signed,  Kathlyn Sacramento, MD  10/02/2016 10:35 AM    [place]

## 2016-10-21 DIAGNOSIS — Z96642 Presence of left artificial hip joint: Secondary | ICD-10-CM | POA: Diagnosis not present

## 2016-10-21 DIAGNOSIS — G8929 Other chronic pain: Secondary | ICD-10-CM | POA: Diagnosis not present

## 2016-10-21 DIAGNOSIS — M25562 Pain in left knee: Secondary | ICD-10-CM | POA: Diagnosis not present

## 2016-10-21 DIAGNOSIS — M7062 Trochanteric bursitis, left hip: Secondary | ICD-10-CM | POA: Diagnosis not present

## 2016-10-21 DIAGNOSIS — M545 Low back pain: Secondary | ICD-10-CM | POA: Diagnosis not present

## 2016-10-28 DIAGNOSIS — M6281 Muscle weakness (generalized): Secondary | ICD-10-CM | POA: Diagnosis not present

## 2016-10-28 DIAGNOSIS — R262 Difficulty in walking, not elsewhere classified: Secondary | ICD-10-CM | POA: Diagnosis not present

## 2016-10-28 DIAGNOSIS — M25672 Stiffness of left ankle, not elsewhere classified: Secondary | ICD-10-CM | POA: Diagnosis not present

## 2016-10-31 DIAGNOSIS — M25672 Stiffness of left ankle, not elsewhere classified: Secondary | ICD-10-CM | POA: Diagnosis not present

## 2016-10-31 DIAGNOSIS — M6281 Muscle weakness (generalized): Secondary | ICD-10-CM | POA: Diagnosis not present

## 2016-10-31 DIAGNOSIS — R262 Difficulty in walking, not elsewhere classified: Secondary | ICD-10-CM | POA: Diagnosis not present

## 2016-11-03 DIAGNOSIS — M25672 Stiffness of left ankle, not elsewhere classified: Secondary | ICD-10-CM | POA: Diagnosis not present

## 2016-11-03 DIAGNOSIS — M6281 Muscle weakness (generalized): Secondary | ICD-10-CM | POA: Diagnosis not present

## 2016-11-03 DIAGNOSIS — R262 Difficulty in walking, not elsewhere classified: Secondary | ICD-10-CM | POA: Diagnosis not present

## 2016-11-05 DIAGNOSIS — R262 Difficulty in walking, not elsewhere classified: Secondary | ICD-10-CM | POA: Diagnosis not present

## 2016-11-05 DIAGNOSIS — M25672 Stiffness of left ankle, not elsewhere classified: Secondary | ICD-10-CM | POA: Diagnosis not present

## 2016-11-05 DIAGNOSIS — M6281 Muscle weakness (generalized): Secondary | ICD-10-CM | POA: Diagnosis not present

## 2016-11-08 ENCOUNTER — Other Ambulatory Visit: Payer: Self-pay | Admitting: Family Medicine

## 2016-11-11 DIAGNOSIS — M6281 Muscle weakness (generalized): Secondary | ICD-10-CM | POA: Diagnosis not present

## 2016-11-11 DIAGNOSIS — R262 Difficulty in walking, not elsewhere classified: Secondary | ICD-10-CM | POA: Diagnosis not present

## 2016-11-11 DIAGNOSIS — M25672 Stiffness of left ankle, not elsewhere classified: Secondary | ICD-10-CM | POA: Diagnosis not present

## 2016-11-13 DIAGNOSIS — R262 Difficulty in walking, not elsewhere classified: Secondary | ICD-10-CM | POA: Diagnosis not present

## 2016-11-13 DIAGNOSIS — M25672 Stiffness of left ankle, not elsewhere classified: Secondary | ICD-10-CM | POA: Diagnosis not present

## 2016-11-13 DIAGNOSIS — M6281 Muscle weakness (generalized): Secondary | ICD-10-CM | POA: Diagnosis not present

## 2016-11-14 ENCOUNTER — Other Ambulatory Visit: Payer: Self-pay

## 2016-11-14 ENCOUNTER — Ambulatory Visit (INDEPENDENT_AMBULATORY_CARE_PROVIDER_SITE_OTHER): Payer: Medicare Other

## 2016-11-14 DIAGNOSIS — I10 Essential (primary) hypertension: Secondary | ICD-10-CM

## 2016-11-14 DIAGNOSIS — I350 Nonrheumatic aortic (valve) stenosis: Secondary | ICD-10-CM | POA: Diagnosis not present

## 2016-11-18 DIAGNOSIS — M25672 Stiffness of left ankle, not elsewhere classified: Secondary | ICD-10-CM | POA: Diagnosis not present

## 2016-11-18 DIAGNOSIS — M6281 Muscle weakness (generalized): Secondary | ICD-10-CM | POA: Diagnosis not present

## 2016-11-18 DIAGNOSIS — R262 Difficulty in walking, not elsewhere classified: Secondary | ICD-10-CM | POA: Diagnosis not present

## 2016-11-20 DIAGNOSIS — M25672 Stiffness of left ankle, not elsewhere classified: Secondary | ICD-10-CM | POA: Diagnosis not present

## 2016-11-20 DIAGNOSIS — R262 Difficulty in walking, not elsewhere classified: Secondary | ICD-10-CM | POA: Diagnosis not present

## 2016-11-20 DIAGNOSIS — M6281 Muscle weakness (generalized): Secondary | ICD-10-CM | POA: Diagnosis not present

## 2016-11-21 DIAGNOSIS — M25552 Pain in left hip: Secondary | ICD-10-CM | POA: Diagnosis not present

## 2016-11-21 DIAGNOSIS — Z96642 Presence of left artificial hip joint: Secondary | ICD-10-CM | POA: Diagnosis not present

## 2016-11-21 DIAGNOSIS — M7062 Trochanteric bursitis, left hip: Secondary | ICD-10-CM | POA: Diagnosis not present

## 2016-11-25 DIAGNOSIS — M25672 Stiffness of left ankle, not elsewhere classified: Secondary | ICD-10-CM | POA: Diagnosis not present

## 2016-11-25 DIAGNOSIS — R262 Difficulty in walking, not elsewhere classified: Secondary | ICD-10-CM | POA: Diagnosis not present

## 2016-11-25 DIAGNOSIS — M6281 Muscle weakness (generalized): Secondary | ICD-10-CM | POA: Diagnosis not present

## 2016-11-27 DIAGNOSIS — M6281 Muscle weakness (generalized): Secondary | ICD-10-CM | POA: Diagnosis not present

## 2016-11-27 DIAGNOSIS — M25672 Stiffness of left ankle, not elsewhere classified: Secondary | ICD-10-CM | POA: Diagnosis not present

## 2016-11-27 DIAGNOSIS — R262 Difficulty in walking, not elsewhere classified: Secondary | ICD-10-CM | POA: Diagnosis not present

## 2016-12-02 DIAGNOSIS — M25672 Stiffness of left ankle, not elsewhere classified: Secondary | ICD-10-CM | POA: Diagnosis not present

## 2016-12-02 DIAGNOSIS — R262 Difficulty in walking, not elsewhere classified: Secondary | ICD-10-CM | POA: Diagnosis not present

## 2016-12-02 DIAGNOSIS — M6281 Muscle weakness (generalized): Secondary | ICD-10-CM | POA: Diagnosis not present

## 2016-12-05 DIAGNOSIS — M6281 Muscle weakness (generalized): Secondary | ICD-10-CM | POA: Diagnosis not present

## 2016-12-05 DIAGNOSIS — M25672 Stiffness of left ankle, not elsewhere classified: Secondary | ICD-10-CM | POA: Diagnosis not present

## 2016-12-05 DIAGNOSIS — R262 Difficulty in walking, not elsewhere classified: Secondary | ICD-10-CM | POA: Diagnosis not present

## 2016-12-09 DIAGNOSIS — R262 Difficulty in walking, not elsewhere classified: Secondary | ICD-10-CM | POA: Diagnosis not present

## 2016-12-09 DIAGNOSIS — M6281 Muscle weakness (generalized): Secondary | ICD-10-CM | POA: Diagnosis not present

## 2016-12-09 DIAGNOSIS — M25672 Stiffness of left ankle, not elsewhere classified: Secondary | ICD-10-CM | POA: Diagnosis not present

## 2016-12-12 DIAGNOSIS — M6281 Muscle weakness (generalized): Secondary | ICD-10-CM | POA: Diagnosis not present

## 2016-12-12 DIAGNOSIS — M25672 Stiffness of left ankle, not elsewhere classified: Secondary | ICD-10-CM | POA: Diagnosis not present

## 2016-12-12 DIAGNOSIS — R262 Difficulty in walking, not elsewhere classified: Secondary | ICD-10-CM | POA: Diagnosis not present

## 2016-12-16 DIAGNOSIS — M6281 Muscle weakness (generalized): Secondary | ICD-10-CM | POA: Diagnosis not present

## 2016-12-16 DIAGNOSIS — M25672 Stiffness of left ankle, not elsewhere classified: Secondary | ICD-10-CM | POA: Diagnosis not present

## 2016-12-16 DIAGNOSIS — R262 Difficulty in walking, not elsewhere classified: Secondary | ICD-10-CM | POA: Diagnosis not present

## 2016-12-19 DIAGNOSIS — M25672 Stiffness of left ankle, not elsewhere classified: Secondary | ICD-10-CM | POA: Diagnosis not present

## 2016-12-19 DIAGNOSIS — M6281 Muscle weakness (generalized): Secondary | ICD-10-CM | POA: Diagnosis not present

## 2016-12-19 DIAGNOSIS — R262 Difficulty in walking, not elsewhere classified: Secondary | ICD-10-CM | POA: Diagnosis not present

## 2016-12-26 DIAGNOSIS — R262 Difficulty in walking, not elsewhere classified: Secondary | ICD-10-CM | POA: Diagnosis not present

## 2016-12-26 DIAGNOSIS — M25672 Stiffness of left ankle, not elsewhere classified: Secondary | ICD-10-CM | POA: Diagnosis not present

## 2016-12-26 DIAGNOSIS — M6281 Muscle weakness (generalized): Secondary | ICD-10-CM | POA: Diagnosis not present

## 2016-12-29 ENCOUNTER — Ambulatory Visit (INDEPENDENT_AMBULATORY_CARE_PROVIDER_SITE_OTHER): Payer: Medicare Other | Admitting: Family Medicine

## 2016-12-29 ENCOUNTER — Ambulatory Visit (INDEPENDENT_AMBULATORY_CARE_PROVIDER_SITE_OTHER): Payer: Medicare Other

## 2016-12-29 ENCOUNTER — Encounter: Payer: Self-pay | Admitting: Family Medicine

## 2016-12-29 VITALS — BP 136/68 | HR 92 | Temp 98.3°F | Wt 150.2 lb

## 2016-12-29 DIAGNOSIS — J209 Acute bronchitis, unspecified: Secondary | ICD-10-CM | POA: Diagnosis not present

## 2016-12-29 DIAGNOSIS — R05 Cough: Secondary | ICD-10-CM | POA: Diagnosis not present

## 2016-12-29 MED ORDER — HYDROCOD POLST-CPM POLST ER 10-8 MG/5ML PO SUER: 5.0000 mL | Freq: Two times a day (BID) | ORAL | 0 refills | Status: DC | PRN

## 2016-12-29 MED ORDER — PREDNISONE 50 MG PO TABS: ORAL_TABLET | ORAL | 0 refills | Status: DC

## 2016-12-29 NOTE — Progress Notes (Signed)
Pre visit review using our clinic review tool, if applicable. No additional management support is needed unless otherwise documented below in the visit note. 

## 2016-12-29 NOTE — Assessment & Plan Note (Signed)
New acute problem. Xray negative. Treating with Tussionex and Prednisone.

## 2016-12-29 NOTE — Patient Instructions (Signed)

## 2016-12-29 NOTE — Progress Notes (Signed)
Subjective:  Patient ID: Robin Alexander, female    DOB: November 19, 1932  Age: 83 y.o. MRN: [id number]  CC: Cough, Respiratory symptoms  HPI:  83 year old female with a history of tobacco abuse presents with the above complaints.  Patient has been sick for past week. She's had postnasal drip, sinus pressure, sneezing, and productive cough. Cough is her predominant symptom. Productive of discolored sputum. She's taken some over-the-counter IV Profen without relief. She had some honey and whiskey with improvement in her cough. No fever. No shortness breath. No other associated symptoms. No other complaints or concerns at this time.  Social Hx   Social History   Social History  . Marital status: Married    Spouse name: N/A  . Number of children: 2  . Years of education: N/A   Occupational History  . Retired - Press photographer Retired   Social History Main Topics  . Smoking status: Former Smoker    Packs/day: 0.75    Years: 20.00    Types: Cigarettes    Quit date: 09/02/1983  . Smokeless tobacco: Never Used  . Alcohol use No     Comment: Occasional wine  . Drug use: No  . Sexual activity: Yes    Birth control/ protection: Coitus interruptus   Other Topics Concern  . None   Social History Narrative   Regular Exercise -  Active but no regular exercise routine   Daily Caffeine Use:  NO          Review of Systems  Constitutional: Negative for fever.  HENT: Positive for congestion, postnasal drip and sinus pressure.   Respiratory: Positive for cough.    Objective:  BP 136/68   Pulse 92   Temp 98.3 F (36.8 C) (Oral)   Wt 150 lb 4 oz (68.2 kg)   SpO2 97%   BMI 29.34 kg/m   BP/Weight 12/29/2016 10/02/2016 1/70/0174  Systolic BP 944 967 591  Diastolic BP 68 62 78  Wt. (Lbs) 150.25 152.25 150  BMI 29.34 29.73 29.29    Physical Exam  Constitutional: She is oriented to person, place, and time. She appears well-developed. No distress.  HENT:  Mouth/Throat: Oropharynx is  clear and moist.  Cardiovascular: Normal rate and regular rhythm.   Pulmonary/Chest: Effort normal. She has wheezes.  Neurological: She is alert and oriented to person, place, and time.  Psychiatric: She has a normal mood and affect.  Vitals reviewed.   Lab Results  Component Value Date   WBC 8.3 02/23/2015   HGB 10.7 (L) 02/23/2015   HCT 32.5 (L) 02/23/2015   PLT 222 02/23/2015   GLUCOSE 93 08/04/2016   CHOL 144 03/11/2016   TRIG 100.0 03/11/2016   HDL 45.90 03/11/2016   LDLCALC 78 03/11/2016   ALT 17 08/04/2016   AST 21 08/04/2016   NA 140 08/04/2016   K 4.9 08/04/2016   CL 102 08/04/2016   CREATININE 1.22 (H) 08/04/2016   BUN 28 (H) 08/04/2016   CO2 30 08/04/2016   INR 1.00 02/23/2015   MICROALBUR 4.0 (H) 05/15/2014   Dg Chest 2 View  Result Date: 12/29/2016 CLINICAL DATA:  Cough and congestion for several days EXAM: CHEST  2 VIEW COMPARISON:  02/03/2015 FINDINGS: Cardiac shadow is within normal limits. Aortic calcifications are again seen. Calcified granuloma is noted in the right mid lung. Mild interstitial changes are again seen. No focal infiltrate or sizable effusion is noted. No soft tissue abnormality is seen. IMPRESSION: No active cardiopulmonary disease. Electronically Signed  By: Inez Catalina M.D.   On: 12/29/2016 11:23    Assessment & Plan:   Problem List Items Addressed This Visit    Acute bronchitis - Primary    New acute problem. Xray negative. Treating with Tussionex and Prednisone.      Relevant Orders   DG Chest 2 View (Completed)      Meds ordered this encounter  Medications  . chlorpheniramine-HYDROcodone (TUSSIONEX PENNKINETIC ER) 10-8 MG/5ML SUER    Sig: Take 5 mLs by mouth every 12 (twelve) hours as needed.    Dispense:  115 mL    Refill:  0  . predniSONE (DELTASONE) 50 MG tablet    Sig: 1 tablet daily x 5 days.    Dispense:  5 tablet    Refill:  0   Follow-up: PRN  [place]

## 2017-01-01 DIAGNOSIS — M25672 Stiffness of left ankle, not elsewhere classified: Secondary | ICD-10-CM | POA: Diagnosis not present

## 2017-01-01 DIAGNOSIS — M6281 Muscle weakness (generalized): Secondary | ICD-10-CM | POA: Diagnosis not present

## 2017-01-01 DIAGNOSIS — R262 Difficulty in walking, not elsewhere classified: Secondary | ICD-10-CM | POA: Diagnosis not present

## 2017-01-05 ENCOUNTER — Ambulatory Visit (INDEPENDENT_AMBULATORY_CARE_PROVIDER_SITE_OTHER): Payer: Medicare Other | Admitting: Family

## 2017-01-05 ENCOUNTER — Encounter: Payer: Self-pay | Admitting: Family

## 2017-01-05 VITALS — BP 135/60 | HR 96 | Temp 98.1°F | Ht 60.0 in | Wt 146.8 lb

## 2017-01-05 DIAGNOSIS — J209 Acute bronchitis, unspecified: Secondary | ICD-10-CM

## 2017-01-05 DIAGNOSIS — J40 Bronchitis, not specified as acute or chronic: Secondary | ICD-10-CM

## 2017-01-05 MED ORDER — HYDROCOD POLST-CPM POLST ER 10-8 MG/5ML PO SUER: 5.0000 mL | Freq: Two times a day (BID) | ORAL | 0 refills | Status: DC | PRN

## 2017-01-05 MED ORDER — CEFDINIR 300 MG PO CAPS: 300.0000 mg | ORAL_CAPSULE | Freq: Two times a day (BID) | ORAL | 0 refills | Status: DC

## 2017-01-05 MED ORDER — ALBUTEROL SULFATE (2.5 MG/3ML) 0.083% IN NEBU: 2.5000 mg | INHALATION_SOLUTION | Freq: Once | RESPIRATORY_TRACT | Status: AC

## 2017-01-05 MED ORDER — ALBUTEROL SULFATE HFA 108 (90 BASE) MCG/ACT IN AERS: 2.0000 | INHALATION_SPRAY | Freq: Four times a day (QID) | RESPIRATORY_TRACT | 1 refills | Status: DC | PRN

## 2017-01-05 NOTE — Progress Notes (Signed)
Pre visit review using our clinic review tool, if applicable. No additional management support is needed unless otherwise documented below in the visit note. 

## 2017-01-05 NOTE — Assessment & Plan Note (Addendum)
Sa02 Improved with nebulizer., 99%. Crt Cl 37 ml/min. Reasonable to start antibiotic therapy based on duration of symptoms and h/o PNA. Return precautions given.

## 2017-01-05 NOTE — Progress Notes (Signed)
Subjective:    Patient ID: Robin Alexander, female    DOB: 1933/01/28, 83 y.o.   MRN: [id number]  CC: Robin Alexander is a 83 y.o. female who presents today for follow up.   HPI: Continues to productive cough x 3 weeks. Unchanged. Endorses wheezing. Saw Lacinda Axon 12/2015 for bronchitis and started on prednisone, no better; CXR negative for acute process.   No fever, sinus pressure , ear pain, SOB, chest pain.   HTN- compliant with medications Denies exertional chest pain or pressure, numbness or tingling radiating to left arm or jaw, palpitations, dizziness, frequent headaches, changes in vision, or shortness of breath.    Former smoker.     HISTORY:  Past Medical History:  Diagnosis Date  . Arthritis   . Chicken pox   . GERD (gastroesophageal reflux disease)   . Heart murmur   . High cholesterol   . HTN (hypertension)   . Pneumonia    spring 2015   Past Surgical History:  Procedure Laterality Date  . ABDOMINAL HYSTERECTOMY     in 50's  . ANTERIOR LAT LUMBAR FUSION Right 02/28/2015   Procedure: ANTERIOR LATERAL LUMBAR FUSION 1 LEVEL;  Surgeon: Phylliss Bob, MD;  Location: [facility];  Service: Orthopedics;  Laterality: Right;  Right sided lumbar 3-4 lateral interbody fusion  . APPENDECTOMY  1950  . CATARACT EXTRACTION W/ INTRAOCULAR LENS  IMPLANT, BILATERAL    . CESAREAN SECTION     2  . ROTATOR CUFF REPAIR     right shoulder   10 YRS  . TOTAL HIP ARTHROPLASTY  2005 & 2006   Bilateral, [place]   Family History  Problem Relation Age of Onset  . Arthritis Mother   . Heart disease Mother   . Arthritis Father   . Stroke Brother   . Heart disease Brother   . Cancer Neg Hx     Allergies: Latex and Codeine Current Outpatient Prescriptions on File Prior to Visit  Medication Sig Dispense Refill  . allopurinol (ZYLOPRIM) 100 MG tablet TAKE 1 TABLET BY MOUTH 2  TIMES DAILY. NEED  APPOINTMENT FOR FUTURE  REFILLS 180 tablet 0  . amLODipine-benazepril (LOTREL) 5-20 MG capsule  Take 1 capsule by mouth  daily 90 capsule 3  . aspirin EC 81 MG tablet Take 81 mg by mouth daily.      . cholecalciferol (VITAMIN D) 1000 UNITS tablet Take 1,000 Units by mouth daily.      . diphenhydrAMINE (BENADRYL) 25 MG tablet Take 25 mg by mouth daily as needed for allergies.     . Multiple Vitamin (MULTIVITAMIN) capsule Take 1 capsule by mouth daily.      Orlie Dakin Sodium ([employer] STOOL SOFTENER/LAXATIVE PO) Take 1 capsule by mouth daily as needed.     . simvastatin (ZOCOR) 20 MG tablet TAKE 1 TABLET BY MOUTH  EVERY MORNING 90 tablet 0  . Turmeric 500 MG CAPS Take 1 capsule by mouth daily.     No current facility-administered medications on file prior to visit.     Social History  Substance Use Topics  . Smoking status: Former Smoker    Packs/day: 0.75    Years: 20.00    Types: Cigarettes    Quit date: 09/02/1983  . Smokeless tobacco: Never Used  . Alcohol use No     Comment: Occasional wine    Review of Systems  Constitutional: Negative for chills and fever.  HENT: Positive for congestion. Negative for ear pain, sinus pressure and  sore throat.   Respiratory: Positive for cough and wheezing. Negative for shortness of breath.   Cardiovascular: Negative for chest pain and palpitations.  Gastrointestinal: Negative for nausea and vomiting.      Objective:    BP 135/60   Pulse 96   Temp 98.1 F (36.7 C) (Oral)   Ht 5' (1.524 m)   Wt 146 lb 12.8 oz (66.6 kg)   SpO2 99%   BMI 28.67 kg/m  BP Readings from Last 3 Encounters:  01/05/17 135/60  12/29/16 136/68  10/02/16 132/62   Wt Readings from Last 3 Encounters:  01/05/17 146 lb 12.8 oz (66.6 kg)  12/29/16 150 lb 4 oz (68.2 kg)  10/02/16 152 lb 4 oz (69.1 kg)    Physical Exam  Constitutional: She appears well-developed and well-nourished.  HENT:  Head: Normocephalic and atraumatic.  Right Ear: Hearing, tympanic membrane, external ear and ear canal normal. No drainage, swelling or tenderness. No foreign  bodies. Tympanic membrane is not erythematous and not bulging. No middle ear effusion. No decreased hearing is noted.  Left Ear: Hearing, tympanic membrane, external ear and ear canal normal. No drainage, swelling or tenderness. No foreign bodies. Tympanic membrane is not erythematous and not bulging.  No middle ear effusion. No decreased hearing is noted.  Nose: Nose normal. No rhinorrhea. Right sinus exhibits no maxillary sinus tenderness and no frontal sinus tenderness. Left sinus exhibits no maxillary sinus tenderness and no frontal sinus tenderness.  Mouth/Throat: Uvula is midline, oropharynx is clear and moist and mucous membranes are normal. No oropharyngeal exudate, posterior oropharyngeal edema, posterior oropharyngeal erythema or tonsillar abscesses.  Eyes: Conjunctivae are normal.  Cardiovascular: Regular rhythm, normal heart sounds and normal pulses.   Pulmonary/Chest: Effort normal. She has wheezes in the right lower field and the left lower field. She has no rhonchi. She has no rales.  Lymphadenopathy:       Head (right side): No submental, no submandibular, no tonsillar, no preauricular, no posterior auricular and no occipital adenopathy present.       Head (left side): No submental, no submandibular, no tonsillar, no preauricular, no posterior auricular and no occipital adenopathy present.    She has no cervical adenopathy.  Neurological: She is alert.  Skin: Skin is warm and dry.  Psychiatric: She has a normal mood and affect. Her speech is normal and behavior is normal. Thought content normal.  Vitals reviewed. Patient felt significantly better after albuterol treatment. Lung sounds clear and increased      Assessment & Plan:   Problem List Items Addressed This Visit      Respiratory   Acute bronchitis    Sa02 Improved with nebulizer., 99%. Crt Cl 37 ml/min. Reasonable to start antibiotic therapy based on duration of symptoms and h/o PNA. Return precautions given.          Other Visit Diagnoses    Bronchitis    -  Primary   Relevant Medications   albuterol (PROVENTIL) (2.5 MG/3ML) 0.083% nebulizer solution 2.5 mg   albuterol (PROVENTIL HFA) 108 (90 Base) MCG/ACT inhaler   chlorpheniramine-HYDROcodone (TUSSIONEX PENNKINETIC ER) 10-8 MG/5ML SUER       I have discontinued Ms. Weirauch's pantoprazole and predniSONE. I am also having her start on albuterol. Additionally, I am having her maintain her diphenhydrAMINE, aspirin EC, multivitamin, cholecalciferol, Sennosides-Docusate Sodium (CVS STOOL SOFTENER/LAXATIVE PO), Turmeric, amLODipine-benazepril, allopurinol, simvastatin, and chlorpheniramine-HYDROcodone. We will continue to administer albuterol.   Meds ordered this encounter  Medications  . albuterol (PROVENTIL) (  2.5 MG/3ML) 0.083% nebulizer solution 2.5 mg  . albuterol (PROVENTIL HFA) 108 (90 Base) MCG/ACT inhaler    Sig: Inhale 2 puffs into the lungs every 6 (six) hours as needed for wheezing or shortness of breath.    Dispense:  1 Inhaler    Refill:  1    Order Specific Question:   Supervising Provider    Answer:   Derrel Nip, TERESA L [[id number]]  . chlorpheniramine-HYDROcodone (TUSSIONEX PENNKINETIC ER) 10-8 MG/5ML SUER    Sig: Take 5 mLs by mouth every 12 (twelve) hours as needed.    Dispense:  115 mL    Refill:  0    Order Specific Question:   Supervising Provider    Answer:   Crecencio Mc [[id number]]    Return precautions given.   Risks, benefits, and alternatives of the medications and treatment plan prescribed today were discussed, and patient expressed understanding.   Education regarding symptom management and diagnosis given to patient on AVS.  Continue to follow with Leone Haven, MD for routine health maintenance.   Donald Pore and I agreed with plan.   Mable Paris, FNP

## 2017-01-05 NOTE — Patient Instructions (Signed)
Use albuterol every 6 hours for first 24 hours to get good medication into the lungs and loosen congestion; after, you may use as needed and eventually stop all together when cough resolves.  Please take cough medication at night only as needed. As we discussed, I do not recommend dosing throughout the day as coughing is a protective mechanism . It also helps to break up thick mucous.  Do not take cough suppressants with alcohol as can lead to trouble breathing. Advise caution if taking cough suppressant and operating machinery ( i.e driving a car) as you may feel very tired.    Let me know if not better.   Monitor blood pressure; goal < 140/90.

## 2017-01-07 DIAGNOSIS — R262 Difficulty in walking, not elsewhere classified: Secondary | ICD-10-CM | POA: Diagnosis not present

## 2017-01-07 DIAGNOSIS — M25672 Stiffness of left ankle, not elsewhere classified: Secondary | ICD-10-CM | POA: Diagnosis not present

## 2017-01-07 DIAGNOSIS — M6281 Muscle weakness (generalized): Secondary | ICD-10-CM | POA: Diagnosis not present

## 2017-01-13 DIAGNOSIS — M25672 Stiffness of left ankle, not elsewhere classified: Secondary | ICD-10-CM | POA: Diagnosis not present

## 2017-01-13 DIAGNOSIS — M6281 Muscle weakness (generalized): Secondary | ICD-10-CM | POA: Diagnosis not present

## 2017-01-13 DIAGNOSIS — R262 Difficulty in walking, not elsewhere classified: Secondary | ICD-10-CM | POA: Diagnosis not present

## 2017-01-15 DIAGNOSIS — M6281 Muscle weakness (generalized): Secondary | ICD-10-CM | POA: Diagnosis not present

## 2017-01-15 DIAGNOSIS — M25672 Stiffness of left ankle, not elsewhere classified: Secondary | ICD-10-CM | POA: Diagnosis not present

## 2017-01-15 DIAGNOSIS — R262 Difficulty in walking, not elsewhere classified: Secondary | ICD-10-CM | POA: Diagnosis not present

## 2017-01-19 DIAGNOSIS — M25672 Stiffness of left ankle, not elsewhere classified: Secondary | ICD-10-CM | POA: Diagnosis not present

## 2017-01-19 DIAGNOSIS — M6281 Muscle weakness (generalized): Secondary | ICD-10-CM | POA: Diagnosis not present

## 2017-01-19 DIAGNOSIS — R262 Difficulty in walking, not elsewhere classified: Secondary | ICD-10-CM | POA: Diagnosis not present

## 2017-01-22 ENCOUNTER — Other Ambulatory Visit: Payer: Self-pay

## 2017-01-22 ENCOUNTER — Other Ambulatory Visit: Payer: Self-pay | Admitting: Family Medicine

## 2017-01-22 NOTE — Telephone Encounter (Signed)
Last Ov 08/04/16 last filled 03/11/16 90 3rf

## 2017-01-23 MED ORDER — AMLODIPINE BESY-BENAZEPRIL HCL 5-20 MG PO CAPS: ORAL_CAPSULE | ORAL | 3 refills | Status: DC

## 2017-01-27 ENCOUNTER — Other Ambulatory Visit: Payer: Self-pay

## 2017-01-27 MED ORDER — AMLODIPINE BESY-BENAZEPRIL HCL 5-20 MG PO CAPS
ORAL_CAPSULE | ORAL | 3 refills | Status: DC
Start: 2017-01-27 — End: 2018-01-27

## 2017-02-02 ENCOUNTER — Ambulatory Visit (INDEPENDENT_AMBULATORY_CARE_PROVIDER_SITE_OTHER): Payer: Medicare Other

## 2017-02-02 ENCOUNTER — Ambulatory Visit (INDEPENDENT_AMBULATORY_CARE_PROVIDER_SITE_OTHER): Payer: Medicare Other | Admitting: Family Medicine

## 2017-02-02 ENCOUNTER — Encounter: Payer: Self-pay | Admitting: Family Medicine

## 2017-02-02 VITALS — BP 140/68 | HR 81 | Temp 98.2°F | Wt 147.6 lb

## 2017-02-02 DIAGNOSIS — E785 Hyperlipidemia, unspecified: Secondary | ICD-10-CM

## 2017-02-02 DIAGNOSIS — R05 Cough: Secondary | ICD-10-CM

## 2017-02-02 DIAGNOSIS — R059 Cough, unspecified: Secondary | ICD-10-CM

## 2017-02-02 DIAGNOSIS — K219 Gastro-esophageal reflux disease without esophagitis: Secondary | ICD-10-CM | POA: Diagnosis not present

## 2017-02-02 DIAGNOSIS — I10 Essential (primary) hypertension: Secondary | ICD-10-CM

## 2017-02-02 DIAGNOSIS — J209 Acute bronchitis, unspecified: Secondary | ICD-10-CM

## 2017-02-02 LAB — COMPREHENSIVE METABOLIC PANEL
ALBUMIN: 4.7 g/dL (ref 3.5–5.2)
ALK PHOS: 66 U/L (ref 39–117)
ALT: 17 U/L (ref 0–35)
AST: 24 U/L (ref 0–37)
BUN: 24 mg/dL — AB (ref 6–23)
CO2: 31 mEq/L (ref 19–32)
Calcium: 10.5 mg/dL (ref 8.4–10.5)
Chloride: 101 mEq/L (ref 96–112)
Creatinine, Ser: 1.05 mg/dL (ref 0.40–1.20)
GFR: 53.3 mL/min — ABNORMAL LOW (ref >60.00)
Glucose, Bld: 91 mg/dL (ref 70–99)
POTASSIUM: 4.5 meq/L (ref 3.5–5.1)
SODIUM: 139 meq/L (ref 135–145)
TOTAL PROTEIN: 7.5 g/dL (ref 6.0–8.3)
Total Bilirubin: 0.6 mg/dL (ref 0.2–1.2)

## 2017-02-02 NOTE — Assessment & Plan Note (Signed)
At goal for age. Check CMP today. Continue current medications.

## 2017-02-02 NOTE — Patient Instructions (Signed)
Nice to see you. We will repeat a chest x-ray given persistent cough and crackles on exam. We will have you start taking Zantac twice daily. If you develop shortness of breath or fevers please seek medical attention.

## 2017-02-02 NOTE — Assessment & Plan Note (Addendum)
Did improve though continues to have cough. Bibasilar crackles on exam today. Does have a prior history of pneumonia. Given persistent cough and crackles we will repeat a chest x-ray. I did discuss that the cough could be related to her untreated reflux as well. She'll start back on Zantac for that. We'll determine if any further intervention is needed based on the chest x-ray.

## 2017-02-02 NOTE — Progress Notes (Signed)
  Tommi Rumps, MD Phone: [phone]  KAVERI PERRAS is a 83 y.o. female who presents today for follow-up.  Hypertension: Not checking at home. She is taking amlodipine/benazepril. No chest pain, shortness breath, or edema.  GERD: Saw GI. She's come off of PPI. Was supposed to be taking Zantac though she discontinued this on her own and has been taking Tums. Symptoms 2-3 times a week with a burning sensation in her throat that she describes as heartburn. No abdominal pain or blood in her stool.  Hyperlipidemia: Taking simvastatin. No right upper quadrant pain. No myalgias.  She was treated for bronchitis about a month and a half ago. Continues to have a dry cough. If she does cough something up it is clear and thick. Cough has improved from when she was sick though has been persistent and not improving over the last several weeks. No shortness breath or fevers. She does still feel run down.  PMH: Former smoker   ROS see history of present illness  Objective  Physical Exam Vitals:   02/02/17 1004  BP: 140/68  Pulse: 81  Temp: 98.2 F (36.8 C)    BP Readings from Last 3 Encounters:  02/02/17 140/68  01/05/17 135/60  12/29/16 136/68   Wt Readings from Last 3 Encounters:  02/02/17 147 lb 9.6 oz (67 kg)  01/05/17 146 lb 12.8 oz (66.6 kg)  12/29/16 150 lb 4 oz (68.2 kg)    Physical Exam  Constitutional: No distress.  HENT:  Head: Normocephalic and atraumatic.  Mouth/Throat: Oropharynx is clear and moist. No oropharyngeal exudate.  Cardiovascular: Normal rate, regular rhythm and normal heart sounds.   Pulmonary/Chest: Effort normal. No respiratory distress. She has no wheezes.  Bibasilar crackles noted  Abdominal: Soft. Bowel sounds are normal. She exhibits no distension. There is no tenderness. There is no rebound and no guarding.  Neurological: She is alert. Gait normal.  Skin: Skin is warm and dry. She is not diaphoretic.     Assessment/Plan: Please see  individual problem list.  Hypertension At goal for age. Check CMP today. Continue current medications.  GERD (gastroesophageal reflux disease) Continues to have some symptoms. She's not currently taking anything other than Tums for this. I encouraged her to start back on Zantac twice daily. She'll continue to monitor.  Hyperlipidemia Tolerating simvastatin. Check LFTs today.  Acute bronchitis Did improve though continues to have cough. Bibasilar crackles on exam today. Does have a prior history of pneumonia. Given persistent cough and crackles we will repeat a chest x-ray. I did discuss that the cough could be related to her untreated reflux as well. She'll start back on Zantac for that. We'll determine if any further intervention is needed based on the chest x-ray.    Orders Placed This Encounter  Procedures  . DG Chest 2 View    Standing Status:   Future    Number of Occurrences:   1    Standing Expiration Date:   04/04/2018    Order Specific Question:   Reason for Exam (SYMPTOM  OR DIAGNOSIS REQUIRED)    Answer:   persistent cough following treatment for bronchitis    Order Specific Question:   Preferred imaging location?    Answer:   [facility] Specific Question:   Radiology Contrast Protocol - do NOT remove file path    Answer:   [email or web address]  . Comp Met (CMET)   Tommi Rumps, MD [place]

## 2017-02-02 NOTE — Assessment & Plan Note (Signed)
Tolerating simvastatin. Check LFTs today.

## 2017-02-02 NOTE — Assessment & Plan Note (Signed)
Continues to have some symptoms. She's not currently taking anything other than Tums for this. I encouraged her to start back on Zantac twice daily. She'll continue to monitor.

## 2017-02-10 DIAGNOSIS — N183 Chronic kidney disease, stage 3 (moderate): Secondary | ICD-10-CM | POA: Diagnosis not present

## 2017-02-10 DIAGNOSIS — I10 Essential (primary) hypertension: Secondary | ICD-10-CM | POA: Diagnosis not present

## 2017-02-10 DIAGNOSIS — R809 Proteinuria, unspecified: Secondary | ICD-10-CM | POA: Diagnosis not present

## 2017-04-28 DIAGNOSIS — M25511 Pain in right shoulder: Secondary | ICD-10-CM | POA: Diagnosis not present

## 2017-04-30 DIAGNOSIS — H04202 Unspecified epiphora, left lacrimal gland: Secondary | ICD-10-CM | POA: Diagnosis not present

## 2017-04-30 DIAGNOSIS — Z961 Presence of intraocular lens: Secondary | ICD-10-CM | POA: Diagnosis not present

## 2017-05-05 DIAGNOSIS — M25652 Stiffness of left hip, not elsewhere classified: Secondary | ICD-10-CM | POA: Diagnosis not present

## 2017-05-05 DIAGNOSIS — R262 Difficulty in walking, not elsewhere classified: Secondary | ICD-10-CM | POA: Diagnosis not present

## 2017-05-05 DIAGNOSIS — M25611 Stiffness of right shoulder, not elsewhere classified: Secondary | ICD-10-CM | POA: Diagnosis not present

## 2017-05-07 DIAGNOSIS — M25652 Stiffness of left hip, not elsewhere classified: Secondary | ICD-10-CM | POA: Diagnosis not present

## 2017-05-07 DIAGNOSIS — R262 Difficulty in walking, not elsewhere classified: Secondary | ICD-10-CM | POA: Diagnosis not present

## 2017-05-07 DIAGNOSIS — M25611 Stiffness of right shoulder, not elsewhere classified: Secondary | ICD-10-CM | POA: Diagnosis not present

## 2017-05-11 DIAGNOSIS — M25652 Stiffness of left hip, not elsewhere classified: Secondary | ICD-10-CM | POA: Diagnosis not present

## 2017-05-11 DIAGNOSIS — M25611 Stiffness of right shoulder, not elsewhere classified: Secondary | ICD-10-CM | POA: Diagnosis not present

## 2017-05-11 DIAGNOSIS — R262 Difficulty in walking, not elsewhere classified: Secondary | ICD-10-CM | POA: Diagnosis not present

## 2017-05-14 DIAGNOSIS — M25611 Stiffness of right shoulder, not elsewhere classified: Secondary | ICD-10-CM | POA: Diagnosis not present

## 2017-05-14 DIAGNOSIS — M25652 Stiffness of left hip, not elsewhere classified: Secondary | ICD-10-CM | POA: Diagnosis not present

## 2017-05-14 DIAGNOSIS — R262 Difficulty in walking, not elsewhere classified: Secondary | ICD-10-CM | POA: Diagnosis not present

## 2017-05-19 DIAGNOSIS — R262 Difficulty in walking, not elsewhere classified: Secondary | ICD-10-CM | POA: Diagnosis not present

## 2017-05-19 DIAGNOSIS — M25611 Stiffness of right shoulder, not elsewhere classified: Secondary | ICD-10-CM | POA: Diagnosis not present

## 2017-05-19 DIAGNOSIS — M25652 Stiffness of left hip, not elsewhere classified: Secondary | ICD-10-CM | POA: Diagnosis not present

## 2017-05-21 DIAGNOSIS — R262 Difficulty in walking, not elsewhere classified: Secondary | ICD-10-CM | POA: Diagnosis not present

## 2017-05-21 DIAGNOSIS — M25611 Stiffness of right shoulder, not elsewhere classified: Secondary | ICD-10-CM | POA: Diagnosis not present

## 2017-05-21 DIAGNOSIS — M25652 Stiffness of left hip, not elsewhere classified: Secondary | ICD-10-CM | POA: Diagnosis not present

## 2017-05-26 DIAGNOSIS — R262 Difficulty in walking, not elsewhere classified: Secondary | ICD-10-CM | POA: Diagnosis not present

## 2017-05-26 DIAGNOSIS — M25652 Stiffness of left hip, not elsewhere classified: Secondary | ICD-10-CM | POA: Diagnosis not present

## 2017-05-26 DIAGNOSIS — M25611 Stiffness of right shoulder, not elsewhere classified: Secondary | ICD-10-CM | POA: Diagnosis not present

## 2017-06-23 DIAGNOSIS — M25511 Pain in right shoulder: Secondary | ICD-10-CM | POA: Diagnosis not present

## 2017-07-06 DIAGNOSIS — M25552 Pain in left hip: Secondary | ICD-10-CM | POA: Diagnosis not present

## 2017-07-08 DIAGNOSIS — M25552 Pain in left hip: Secondary | ICD-10-CM | POA: Diagnosis not present

## 2017-07-14 DIAGNOSIS — M25552 Pain in left hip: Secondary | ICD-10-CM | POA: Diagnosis not present

## 2017-07-16 DIAGNOSIS — M25552 Pain in left hip: Secondary | ICD-10-CM | POA: Diagnosis not present

## 2017-07-21 DIAGNOSIS — M25552 Pain in left hip: Secondary | ICD-10-CM | POA: Diagnosis not present

## 2017-07-22 ENCOUNTER — Ambulatory Visit (INDEPENDENT_AMBULATORY_CARE_PROVIDER_SITE_OTHER): Payer: Medicare Other

## 2017-07-22 VITALS — BP 126/72 | HR 69 | Temp 97.9°F | Ht 60.0 in

## 2017-07-22 DIAGNOSIS — Z1331 Encounter for screening for depression: Secondary | ICD-10-CM

## 2017-07-22 DIAGNOSIS — M25552 Pain in left hip: Secondary | ICD-10-CM | POA: Diagnosis not present

## 2017-07-22 DIAGNOSIS — Z0000 Encounter for general adult medical examination without abnormal findings: Secondary | ICD-10-CM | POA: Diagnosis not present

## 2017-07-22 NOTE — Patient Instructions (Addendum)
  Ms. Bohlken , Thank you for taking time to come for your Medicare Wellness Visit. I appreciate your ongoing commitment to your health goals. Please review the following plan we discussed and let me know if I can assist you in the future.   Follow up with Dr. Caryl Bis as needed.  These are the goals we discussed: Goals    .  INCREASE WATER INTAKE     Stay hydrated    . Increase physical activity     Continue physical therapy exercises Walk for exercise as tolerated       This is a list of the screening recommended for you and due dates:  Health Maintenance  Topic Date Due  . Tetanus Vaccine  05/22/2025  . Flu Shot  Completed  . DEXA scan (bone density measurement)  Completed  . Pneumonia vaccines  Completed

## 2017-07-22 NOTE — Progress Notes (Signed)
Subjective:   Robin Alexander is a 83 y.o. female who presents for Medicare Annual (Subsequent) preventive examination.  Review of Systems:  No ROS.  Medicare Wellness Visit. Additional risk factors are reflected in the social history.  Cardiac Risk Factors include: advanced age (>79men, >66 women);hypertension     Objective:     Vitals: BP 126/72 (BP Location: Left Arm, Patient Position: Sitting, Cuff Size: Normal)   Pulse 69   Temp 97.9 F (36.6 C) (Oral)   Ht 5' (1.524 m)   BMI 28.83 kg/m   Body mass index is 28.83 kg/m.   Tobacco Social History   Tobacco Use  Smoking Status Former Smoker  . Packs/day: 0.75  . Years: 20.00  . Pack years: 15.00  . Types: Cigarettes  . Last attempt to quit: 09/02/1983  . Years since quitting: 33.9  Smokeless Tobacco Never Used     Counseling given: Not Answered   Past Medical History:  Diagnosis Date  . Arthritis   . Chicken pox   . GERD (gastroesophageal reflux disease)   . Heart murmur   . High cholesterol   . HTN (hypertension)   . Pneumonia    spring 2015   Past Surgical History:  Procedure Laterality Date  . ABDOMINAL HYSTERECTOMY     in 50's  . ANTERIOR LAT LUMBAR FUSION Right 02/28/2015   Procedure: ANTERIOR LATERAL LUMBAR FUSION 1 LEVEL;  Surgeon: Phylliss Bob, MD;  Location: [facility];  Service: Orthopedics;  Laterality: Right;  Right sided lumbar 3-4 lateral interbody fusion  . APPENDECTOMY  1950  . CATARACT EXTRACTION W/ INTRAOCULAR LENS  IMPLANT, BILATERAL    . CESAREAN SECTION     2  . ROTATOR CUFF REPAIR     right shoulder   10 YRS  . TOTAL HIP ARTHROPLASTY  2005 & 2006   Bilateral, [place]   Family History  Problem Relation Age of Onset  . Arthritis Mother   . Heart disease Mother   . Arthritis Father   . Stroke Brother   . Heart disease Brother   . Cancer Neg Hx    Social History   Substance and Sexual Activity  Sexual Activity Yes  . Birth control/protection: Coitus interruptus     Outpatient Encounter Medications as of 07/22/2017  Medication Sig  . amLODipine-benazepril (LOTREL) 5-20 MG capsule Take 1 capsule by mouth  daily  . aspirin EC 81 MG tablet Take 81 mg by mouth daily.    . cholecalciferol (VITAMIN D) 1000 UNITS tablet Take 1,000 Units by mouth daily.    . diphenhydrAMINE (BENADRYL) 25 MG tablet Take 25 mg by mouth daily as needed for allergies.   . Multiple Vitamin (MULTIVITAMIN) capsule Take 1 capsule by mouth daily.    Orlie Dakin Sodium ([employer]S STOOL SOFTENER/LAXATIVE PO) Take 1 capsule by mouth daily as needed.   . simvastatin (ZOCOR) 20 MG tablet TAKE 1 TABLET BY MOUTH  EVERY MORNING  . Turmeric 500 MG CAPS Take 1 capsule by mouth daily.  . [DISCONTINUED] albuterol (PROVENTIL HFA) 108 (90 Base) MCG/ACT inhaler Inhale 2 puffs into the lungs every 6 (six) hours as needed for wheezing or shortness of breath.  . [DISCONTINUED] allopurinol (ZYLOPRIM) 100 MG tablet TAKE 1 TABLET BY MOUTH 2  TIMES DAILY. NEED  APPOINTMENT FOR FUTURE  REFILLS  . [DISCONTINUED] chlorpheniramine-HYDROcodone (TUSSIONEX PENNKINETIC ER) 10-8 MG/5ML SUER Take 5 mLs by mouth every 12 (twelve) hours as needed.  . Cyanocobalamin (VITAMIN B 12 PO) Take by  mouth 2 (two) times daily.  . Magnesium 100 MG CAPS Take by mouth.  . ranitidine (ZANTAC) 75 MG tablet Take by mouth.   Facility-Administered Encounter Medications as of 07/22/2017  Medication  . albuterol (PROVENTIL) (2.5 MG/3ML) 0.083% nebulizer solution 2.5 mg    Activities of Daily Living In your present state of health, do you have any difficulty performing the following activities: 07/22/2017  Hearing? N  Vision? N  Difficulty concentrating or making decisions? N  Walking or climbing stairs? Y  Dressing or bathing? Y  Doing errands, shopping? N  Preparing Food and eating ? N  Using the Toilet? N  In the past six months, have you accidently leaked urine? N  Do you have problems with loss of bowel control? N   Managing your Medications? N  Managing your Finances? N  Housekeeping or managing your Housekeeping? N  Some recent data might be hidden    Patient Care Team: Leone Haven, MD as PCP - General (Family Medicine)    Assessment:    This is a routine wellness examination for [place]. The goal of the wellness visit is to assist the patient how to close the gaps in care and create a preventative care plan for the patient.   The roster of all physicians providing medical care to patient is listed in the Snapshot section of the chart.  Taking calcium VIT D as appropriate/Osteoporosis risk reviewed.    Safety issues reviewed; Smoke and carbon monoxide detectors in the home. No firearms in the home.  Wears seatbelts when driving or riding with others. Patient does wear sunscreen or protective clothing when in direct sunlight. No violence in the home.  Depression- PHQ 2 &9 complete.  No signs/symptoms or verbal communication regarding little pleasure in doing things, feeling down, depressed or hopeless. No changes in sleeping, energy, eating, concentrating.  No thoughts of self harm or harm towards others.  Time spent on this topic is 10 minutes.   Patient is alert, normal appearance, oriented to person/place/and time.  Correctly identified the president of the [place], recall of 3/3 words, and performing simple calculations. Displays appropriate judgement and can read correct time from watch face.   No new identified risk were noted.  No failures at ADL's or IADL's.    BMI- discussed the importance of a healthy diet, water intake and the benefits of aerobic exercise. Educational material provided.   24 hour diet recall: Low carb foods  Daily fluid intake: 0 cups of caffeine, 3 cups of water  Dental- every 6 months.  Eye- Visual acuity not assessed per patient preference since they have regular follow up with the ophthalmologist.  Wears corrective lenses.  Sleep patterns- Sleeps 8  hours at night.  Wakes feeling rested.  Health maintenance gaps- closed.  Patient Concerns: None at this time. Follow up with PCP as needed.  Exercise Activities and Dietary recommendations Current Exercise Habits: Structured exercise class, Type of exercise: calisthenics;stretching(physical therapy), Time (Minutes): 60, Frequency (Times/Week): 2, Weekly Exercise (Minutes/Week): 120, Intensity: Mild  Goals    .  INCREASE WATER INTAKE     Stay hydrated    . Increase physical activity     Continue physical therapy exercises Walk for exercise as tolerated      Fall Risk Fall Risk  07/22/2017 01/05/2017 05/23/2016 03/11/2016 05/23/2015  Falls in the past year? No No Yes No No  Number falls in past yr: - - 1 - -  Injury with Fall? - -  Yes - -  Follow up - - Education provided;Falls prevention discussed - -   Depression Screen PHQ 2/9 Scores 07/22/2017 01/05/2017 05/23/2016 03/11/2016  PHQ - 2 Score 0 0 0 0  PHQ- 9 Score 0 - - -     Cognitive Function MMSE - Mini Mental State Exam 07/22/2017 05/23/2016 05/23/2015  Orientation to time 5 5 5   Orientation to Place 5 5 5   Registration 3 3 3   Attention/ Calculation 5 5 5   Recall 3 3 3   Language- name 2 objects 2 2 2   Language- repeat 1 1 1   Language- follow 3 step command 3 3 3   Language- read & follow direction 1 1 1   Write a sentence 1 1 1   Copy design 1 1 1   Total score 30 30 30         Immunization History  Administered Date(s) Administered  . Influenza Split 07/31/2011, 06/29/2012  . Influenza, High Dose Seasonal PF 05/23/2015, 05/02/2016  . Influenza,inj,Quad PF,6+ Mos 05/04/2013, 05/15/2014  . Pneumococcal Conjugate-13 11/03/2013  . Pneumococcal Polysaccharide-23 09/01/2009  . Tdap 05/23/2015  . Zoster 07/30/2010   Screening Tests Health Maintenance  Topic Date Due  . TETANUS/TDAP  05/22/2025  . INFLUENZA VACCINE  Completed  . DEXA SCAN  Completed  . PNA vac Low Risk Adult  Completed      Plan:    End of life  planning; Advance aging; Advanced directives discussed. Copy of current HCPOA/Living Will on file.    I have personally reviewed and noted the following in the patient's chart:   . Medical and social history . Use of alcohol, tobacco or illicit drugs  . Current medications and supplements . Functional ability and status . Nutritional status . Physical activity . Advanced directives . List of other physicians . Hospitalizations, surgeries, and ER visits in previous 12 months . Vitals . Screenings to include cognitive, depression, and falls . Referrals and appointments  In addition, I have reviewed and discussed with patient certain preventive protocols, quality metrics, and best practice recommendations. A written personalized care plan for preventive services as well as general preventive health recommendations were provided to patient.     Varney Biles, LPN  [id number]   Reviewed above information.  Agree with assessment and plan.   Dr Nicki Reaper

## 2017-07-28 DIAGNOSIS — M25552 Pain in left hip: Secondary | ICD-10-CM | POA: Diagnosis not present

## 2017-07-30 DIAGNOSIS — M25552 Pain in left hip: Secondary | ICD-10-CM | POA: Diagnosis not present

## 2017-08-04 DIAGNOSIS — M25552 Pain in left hip: Secondary | ICD-10-CM | POA: Diagnosis not present

## 2017-08-10 ENCOUNTER — Ambulatory Visit: Payer: Medicare Other | Admitting: Family Medicine

## 2017-08-11 DIAGNOSIS — M25552 Pain in left hip: Secondary | ICD-10-CM | POA: Diagnosis not present

## 2017-08-13 DIAGNOSIS — M25552 Pain in left hip: Secondary | ICD-10-CM | POA: Diagnosis not present

## 2017-08-18 DIAGNOSIS — M25552 Pain in left hip: Secondary | ICD-10-CM | POA: Diagnosis not present

## 2017-08-20 DIAGNOSIS — M25552 Pain in left hip: Secondary | ICD-10-CM | POA: Diagnosis not present

## 2017-08-26 DIAGNOSIS — M25552 Pain in left hip: Secondary | ICD-10-CM | POA: Diagnosis not present

## 2017-08-28 DIAGNOSIS — M25552 Pain in left hip: Secondary | ICD-10-CM | POA: Diagnosis not present

## 2017-09-02 DIAGNOSIS — M25552 Pain in left hip: Secondary | ICD-10-CM | POA: Diagnosis not present

## 2017-09-07 DIAGNOSIS — M25552 Pain in left hip: Secondary | ICD-10-CM | POA: Diagnosis not present

## 2017-09-11 DIAGNOSIS — M25552 Pain in left hip: Secondary | ICD-10-CM | POA: Diagnosis not present

## 2017-09-14 DIAGNOSIS — M25552 Pain in left hip: Secondary | ICD-10-CM | POA: Diagnosis not present

## 2017-09-17 DIAGNOSIS — M25552 Pain in left hip: Secondary | ICD-10-CM | POA: Diagnosis not present

## 2017-09-22 DIAGNOSIS — M25552 Pain in left hip: Secondary | ICD-10-CM | POA: Diagnosis not present

## 2017-09-24 DIAGNOSIS — M25552 Pain in left hip: Secondary | ICD-10-CM | POA: Diagnosis not present

## 2017-09-28 ENCOUNTER — Other Ambulatory Visit: Payer: Self-pay | Admitting: Family Medicine

## 2017-09-28 DIAGNOSIS — Z1231 Encounter for screening mammogram for malignant neoplasm of breast: Secondary | ICD-10-CM

## 2017-10-13 ENCOUNTER — Ambulatory Visit
Admission: RE | Admit: 2017-10-13 | Discharge: 2017-10-13 | Disposition: A | Discharge status: Home / Self Care | Payer: Medicare Other | Source: Ambulatory Visit | Attending: Family Medicine | Admitting: Family Medicine

## 2017-10-13 DIAGNOSIS — Z1231 Encounter for screening mammogram for malignant neoplasm of breast: Secondary | ICD-10-CM | POA: Diagnosis not present

## 2017-10-15 ENCOUNTER — Encounter: Payer: Self-pay | Admitting: Family Medicine

## 2017-10-15 ENCOUNTER — Other Ambulatory Visit: Payer: Self-pay

## 2017-10-15 ENCOUNTER — Ambulatory Visit: Payer: Medicare Other | Admitting: Family Medicine

## 2017-10-15 ENCOUNTER — Ambulatory Visit (INDEPENDENT_AMBULATORY_CARE_PROVIDER_SITE_OTHER): Payer: Medicare Other | Admitting: Family Medicine

## 2017-10-15 VITALS — BP 120/58 | HR 75 | Temp 98.0°F | Wt 149.8 lb

## 2017-10-15 DIAGNOSIS — E663 Overweight: Secondary | ICD-10-CM

## 2017-10-15 DIAGNOSIS — G479 Sleep disorder, unspecified: Secondary | ICD-10-CM

## 2017-10-15 DIAGNOSIS — I10 Essential (primary) hypertension: Secondary | ICD-10-CM | POA: Diagnosis not present

## 2017-10-15 DIAGNOSIS — M19011 Primary osteoarthritis, right shoulder: Secondary | ICD-10-CM | POA: Diagnosis not present

## 2017-10-15 DIAGNOSIS — Z780 Asymptomatic menopausal state: Secondary | ICD-10-CM

## 2017-10-15 DIAGNOSIS — E785 Hyperlipidemia, unspecified: Secondary | ICD-10-CM

## 2017-10-15 NOTE — Patient Instructions (Signed)
Nice to see you. You can try melatonin 1 mg nightly for sleep if you need it. We will get you set up for a bone density test. We will check lab work fasting.

## 2017-10-15 NOTE — Assessment & Plan Note (Signed)
Well controlled. Continue regimen.

## 2017-10-15 NOTE — Assessment & Plan Note (Signed)
Patient is considering undergoing right shoulder replacement.  She would be low risk for medical complications related to this.  She does have a cardiologist and may need clearance from them if required by her surgeon.  If they need clearance from [place] we will fill out the form when they need.

## 2017-10-15 NOTE — Assessment & Plan Note (Addendum)
DEXA scan ordered.  Decrease vitamin D to 1000 international units daily.

## 2017-10-15 NOTE — Progress Notes (Signed)
Tommi Rumps, MD Phone: [phone]  Robin Alexander is a 84 y.o. female who presents today for f/u.  HYPERLIPIDEMIA Symptoms Chest pain on exertion:  no   Medications: Compliance- taking simvastatin Right upper quadrant pain- no  Muscle aches- no  HYPERTENSION  Disease Monitoring  Home BP Monitoring not checking Chest pain- no    Dyspnea- no Medications  Compliance-  Taking amlodipine, benazepril.   Edema- no  She is contemplating whether or not to have right shoulder replacement surgery.  This will be done through [facility].  She notes no history of heart attack or stroke.  No issues with anesthesia in the past.  No family history with anesthesia.  No thyroid dysfunction or liver dysfunction.  Does note occasional sleep issues with difficulty falling asleep.  She will still sleep 7 or 8 hours.  She was taking Tylenol PM to stop that given that her pharmacist told her this was not a good medicine to take.  No depression or anxiety.  She has been taking vitamin D for tablets a day.  No calcium supplement other than what is in her multivitamin.  Does have a history of a broken foot previously.  No recent DEXA scan.   Social History   Tobacco Use  Smoking Status Former Smoker  . Packs/day: 0.75  . Years: 20.00  . Pack years: 15.00  . Types: Cigarettes  . Last attempt to quit: 09/02/1983  . Years since quitting: 34.1  Smokeless Tobacco Never Used     ROS see history of present illness  Objective  Physical Exam Vitals:   10/15/17 1024  BP: (!) 120/58  Pulse: 75  Temp: 98 F (36.7 C)  SpO2: 94%    BP Readings from Last 3 Encounters:  10/15/17 (!) 120/58  07/22/17 126/72  02/02/17 140/68   Wt Readings from Last 3 Encounters:  10/15/17 149 lb 12.8 oz (67.9 kg)  02/02/17 147 lb 9.6 oz (67 kg)  01/05/17 146 lb 12.8 oz (66.6 kg)    Physical Exam  Constitutional: No distress.  Cardiovascular: Normal rate, regular rhythm and normal heart sounds.    Pulmonary/Chest: Effort normal and breath sounds normal.  Abdominal: Soft. Bowel sounds are normal. She exhibits no distension. There is no tenderness. There is no rebound and no guarding.  Musculoskeletal: She exhibits no edema.  Neurological: She is alert. Gait normal.  Skin: Skin is warm and dry. She is not diaphoretic.     Assessment/Plan: Please see individual problem list.  Hypertension Well controlled. Continue regimen.  Hyperlipidemia Continue simvastatin.  She will return for fasting lab work.  Postmenopausal estrogen deficiency DEXA scan ordered.  Decrease vitamin D to 1000 international units daily.  Localized osteoarthritis of right shoulder Patient is considering undergoing right shoulder replacement.  She would be low risk for medical complications related to this.  She does have a cardiologist and may need clearance from them if required by her surgeon.  If they need clearance from [place] we will fill out the form when they need.  Difficulty sleeping Occasional issues with this.  Discussed not taking Tylenol PM.  She can try melatonin over-the-counter.   Orders Placed This Encounter  Procedures  . DG Bone Density    Standing Status:   Future    Standing Expiration Date:   10/15/2018    Order Specific Question:   Reason for Exam (SYMPTOM  OR DIAGNOSIS REQUIRED)    Answer:   osteoporosis screening    Order Specific Question:  Preferred imaging location?    Answer:   [facility]  . Lipid panel    Standing Status:   Future    Standing Expiration Date:   10/15/2018  . Comp Met (CMET)    Standing Status:   Future    Standing Expiration Date:   10/15/2018  . HgB A1c    Standing Status:   Future    Standing Expiration Date:   10/15/2018    No orders of the defined types were placed in this encounter.    Tommi Rumps, MD [place]

## 2017-10-15 NOTE — Assessment & Plan Note (Signed)
Occasional issues with this.  Discussed not taking Tylenol PM.  She can try melatonin over-the-counter.

## 2017-10-15 NOTE — Assessment & Plan Note (Signed)
Continue simvastatin.  She will return for fasting lab work.

## 2017-10-19 ENCOUNTER — Other Ambulatory Visit (INDEPENDENT_AMBULATORY_CARE_PROVIDER_SITE_OTHER): Payer: Medicare Other

## 2017-10-19 DIAGNOSIS — E785 Hyperlipidemia, unspecified: Secondary | ICD-10-CM

## 2017-10-19 DIAGNOSIS — E663 Overweight: Secondary | ICD-10-CM

## 2017-10-19 LAB — COMPREHENSIVE METABOLIC PANEL
ALBUMIN: 4.5 g/dL (ref 3.5–5.2)
ALK PHOS: 61 U/L (ref 39–117)
ALT: 18 U/L (ref 0–35)
AST: 20 U/L (ref 0–37)
BILIRUBIN TOTAL: 0.6 mg/dL (ref 0.2–1.2)
BUN: 28 mg/dL — ABNORMAL HIGH (ref 6–23)
CALCIUM: 10.3 mg/dL (ref 8.4–10.5)
CO2: 28 mEq/L (ref 19–32)
Chloride: 99 mEq/L (ref 96–112)
Creatinine, Ser: 1.11 mg/dL (ref 0.40–1.20)
GFR: 49.91 mL/min — AB (ref >60.00)
GLUCOSE: 106 mg/dL — AB (ref 70–99)
POTASSIUM: 4.7 meq/L (ref 3.5–5.1)
Sodium: 136 mEq/L (ref 135–145)
TOTAL PROTEIN: 7.7 g/dL (ref 6.0–8.3)

## 2017-10-19 LAB — LIPID PANEL
CHOLESTEROL: 136 mg/dL (ref 0–200)
HDL: 44.5 mg/dL (ref >39.00)
LDL Cholesterol: 71 mg/dL (ref 0–99)
NONHDL: 91.88
Total CHOL/HDL Ratio: 3
Triglycerides: 105 mg/dL (ref 0.0–149.0)
VLDL: 21 mg/dL (ref 0.0–40.0)

## 2017-10-19 LAB — HEMOGLOBIN A1C: HEMOGLOBIN A1C: 6.1 % (ref 4.6–6.5)

## 2017-10-21 DIAGNOSIS — M12811 Other specific arthropathies, not elsewhere classified, right shoulder: Secondary | ICD-10-CM | POA: Diagnosis not present

## 2017-10-22 ENCOUNTER — Other Ambulatory Visit: Payer: Self-pay | Admitting: Orthopedic Surgery

## 2017-10-22 ENCOUNTER — Telehealth: Payer: Self-pay | Admitting: Cardiovascular Disease

## 2017-10-22 DIAGNOSIS — M75101 Unspecified rotator cuff tear or rupture of right shoulder, not specified as traumatic: Principal | ICD-10-CM

## 2017-10-22 DIAGNOSIS — M12811 Other specific arthropathies, not elsewhere classified, right shoulder: Secondary | ICD-10-CM

## 2017-10-22 NOTE — Telephone Encounter (Signed)
Spoke with patient and advised that typically they will send [place] a request for cardiac clearance and that when that comes we will send it over to Dr. Fletcher Anon for review. Advised for her to give me a call back if she should have any questions and we will watch for that clearance to come in from his office. She was appreciative for the call back with no further questions.

## 2017-10-22 NOTE — Telephone Encounter (Signed)
Pt states she is having shoulder replacement and needs clearance. Dr. Posey Pronto is doing her surgery.

## 2017-10-26 ENCOUNTER — Ambulatory Visit
Admission: RE | Admit: 2017-10-26 | Discharge: 2017-10-26 | Disposition: A | Discharge status: Home / Self Care | Payer: Medicare Other | Source: Ambulatory Visit | Attending: Orthopedic Surgery | Admitting: Orthopedic Surgery

## 2017-10-26 DIAGNOSIS — M62511 Muscle wasting and atrophy, not elsewhere classified, right shoulder: Secondary | ICD-10-CM | POA: Insufficient documentation

## 2017-10-26 DIAGNOSIS — M19011 Primary osteoarthritis, right shoulder: Secondary | ICD-10-CM | POA: Insufficient documentation

## 2017-10-26 DIAGNOSIS — M75101 Unspecified rotator cuff tear or rupture of right shoulder, not specified as traumatic: Principal | ICD-10-CM

## 2017-10-26 DIAGNOSIS — M12811 Other specific arthropathies, not elsewhere classified, right shoulder: Secondary | ICD-10-CM | POA: Diagnosis present

## 2017-10-26 DIAGNOSIS — M75121 Complete rotator cuff tear or rupture of right shoulder, not specified as traumatic: Secondary | ICD-10-CM | POA: Diagnosis not present

## 2017-10-26 DIAGNOSIS — M25511 Pain in right shoulder: Secondary | ICD-10-CM | POA: Diagnosis not present

## 2017-10-26 DIAGNOSIS — S43021A Posterior subluxation of right humerus, initial encounter: Secondary | ICD-10-CM | POA: Diagnosis not present

## 2017-10-26 DIAGNOSIS — X58XXXA Exposure to other specified factors, initial encounter: Secondary | ICD-10-CM | POA: Insufficient documentation

## 2017-10-30 ENCOUNTER — Telehealth: Payer: Self-pay | Admitting: Cardiovascular Disease

## 2017-10-30 DIAGNOSIS — I350 Nonrheumatic aortic (valve) stenosis: Secondary | ICD-10-CM

## 2017-10-30 NOTE — Telephone Encounter (Signed)
   [facility] Pre-operative Risk Assessment    Request for surgical clearance:  1. What type of surgery is being performed? Right Reverse Total Shoulder Arthroplasty   2. When is this surgery scheduled? 11/20/17   3. What type of clearance is required (medical clearance vs. Pharmacy clearance to hold med vs. Both)? Cardiac Clearance  4. Are there any medications that need to be held prior to surgery and how long? Not specified, but the patient appears to be on ASA 81 mg once daily   5. Practice name and name of physician performing surgery? [facility]- Dr. Leim Fabry   6. What is your office phone and fax number? [phone] - phone                       [phone]- fax   7. Anesthesia type (None, local, MAC, general) ? Not specified   Robin Alexander 10/30/2017, 3:30 PM  _________________________________________________________________   (provider comments below)

## 2017-10-30 NOTE — Telephone Encounter (Signed)
To Dr. Fletcher Anon to review- patient last seen on 10/22/17 and has a follow up appointment scheduled for 12/04/17 with Dr. Fletcher Anon.  Please advise if the patient will need to be seen prior to surgery.

## 2017-11-02 NOTE — Telephone Encounter (Signed)
Given that she has aortic stenosis, I recommend doing an echocardiogram before surgery to make sure the stenosis is not worse since last year.

## 2017-11-03 NOTE — Telephone Encounter (Signed)
Patient verbalized understanding of needed the echo. Patient transferred to scheduler.

## 2017-11-09 ENCOUNTER — Inpatient Hospital Stay: Admission: RE | Admit: 2017-11-09 | Payer: Medicare Other | Source: Ambulatory Visit

## 2017-11-12 ENCOUNTER — Other Ambulatory Visit: Payer: Self-pay

## 2017-11-12 ENCOUNTER — Ambulatory Visit (INDEPENDENT_AMBULATORY_CARE_PROVIDER_SITE_OTHER): Payer: Medicare Other

## 2017-11-12 DIAGNOSIS — I350 Nonrheumatic aortic (valve) stenosis: Secondary | ICD-10-CM | POA: Diagnosis not present

## 2017-11-13 ENCOUNTER — Ambulatory Visit: Payer: Medicare Other | Admitting: Cardiovascular Disease

## 2017-11-13 ENCOUNTER — Encounter: Payer: Self-pay | Admitting: Cardiovascular Disease

## 2017-11-13 VITALS — BP 148/58 | HR 75 | Ht 60.0 in | Wt 150.0 lb

## 2017-11-13 DIAGNOSIS — I059 Rheumatic mitral valve disease, unspecified: Secondary | ICD-10-CM | POA: Diagnosis not present

## 2017-11-13 DIAGNOSIS — I350 Nonrheumatic aortic (valve) stenosis: Secondary | ICD-10-CM | POA: Diagnosis not present

## 2017-11-13 DIAGNOSIS — Z01810 Encounter for preprocedural cardiovascular examination: Secondary | ICD-10-CM | POA: Diagnosis not present

## 2017-11-13 DIAGNOSIS — E785 Hyperlipidemia, unspecified: Secondary | ICD-10-CM | POA: Diagnosis not present

## 2017-11-13 DIAGNOSIS — I10 Essential (primary) hypertension: Secondary | ICD-10-CM

## 2017-11-13 NOTE — Patient Instructions (Signed)
Medication Instructions: Continue same medications.   Labwork: None.   Procedures/Testing: None.   Follow-Up: 6 months with Dr. Avante Carneiro.   Any Additional Special Instructions Will Be Listed Below (If Applicable).     If you need a refill on your cardiac medications before your next appointment, please call your pharmacy.   

## 2017-11-13 NOTE — Progress Notes (Signed)
Cardiology Office Note   Date:  11/13/2017   ID:  Robin Alexander, DOB [birth year], MRN [id number]  PCP:  Leone Haven, MD  Cardiologist:   Kathlyn Sacramento, MD   Chief Complaint  Patient presents with  . OTHER    12 month f/u no complaints today. Meds reviewed verbally with pt.      History of Present Illness: Robin Alexander is a 84 y.o. female who presents for a follow up visit regarding  aortic stenosis and preop cardiovascular evaluation for shoulder surgery.  She has known history of hypertension, hyperlipidemia and gastroesophageal reflux disease. She has right bundle branch block on her EKG.  Treadmill stress test in October of 2014 showed no evidence of ischemia.  Echocardiogram in December 2016 showed an EF of 60-65%, moderate aortic stenosis with a mean gradient of 25 mmHg and valve area of 1.08 cm. There was also moderate mitral stenosis and moderate regurgitation. Mean gradient was 9 mmHg with a valve area of 1.38. Systolic pulmonary pressure was 50 mmHg.  She has been doing reasonably well and denies any chest pain, shortness of breath or palpitations.  She is having severe right shoulder discomfort and did not respond to injections.  She is scheduled for shoulder surgery next week. She is overall active and is able to do housework without significant limitations. She had an echocardiogram done yesterday which was personally reviewed by me.  It showed normal LV systolic function, stable moderate aortic stenosis with a mean gradient of 28, mild to moderate mitral stenosis and mild mitral regurgitation.  Past Medical History:  Diagnosis Date  . Abnormal CT scan, chest 03/20/2014  . Arthritis   . Chicken pox   . GERD (gastroesophageal reflux disease)   . Heart murmur   . High cholesterol   . HTN (hypertension)   . Pneumonia    spring 2015    Past Surgical History:  Procedure Laterality Date  . ABDOMINAL HYSTERECTOMY     in 50's  . ANTERIOR LAT LUMBAR  FUSION Right 02/28/2015   Procedure: ANTERIOR LATERAL LUMBAR FUSION 1 LEVEL;  Surgeon: Phylliss Bob, MD;  Location: [facility];  Service: Orthopedics;  Laterality: Right;  Right sided lumbar 3-4 lateral interbody fusion  . APPENDECTOMY  1950  . CATARACT EXTRACTION W/ INTRAOCULAR LENS  IMPLANT, BILATERAL    . CESAREAN SECTION     2  . ROTATOR CUFF REPAIR     right shoulder   10 YRS  . TOTAL HIP ARTHROPLASTY  2005 & 2006   Bilateral, [place]     Current Outpatient Medications  Medication Sig Dispense Refill  . amLODipine-benazepril (LOTREL) 5-20 MG capsule Take 1 capsule by mouth  daily 90 capsule 3  . aspirin EC 81 MG tablet Take 81 mg by mouth at bedtime.     . Cyanocobalamin (VITAMIN B12) 3000 MCG SUBL Take 6,000 mg by mouth daily.    Marland Kitchen docusate sodium (COLACE) 100 MG capsule Take 100 mg by mouth daily.    . Magnesium 250 MG TABS Take 250 mg by mouth daily.    . Multiple Vitamin (MULTIVITAMIN WITH MINERALS) TABS tablet Take 1 tablet by mouth daily. CENTRUM SILVER    . Omega-3 Fatty Acids (FISH OIL) 1000 MG CAPS Take 1,000-2,000 mg by mouth daily. TAKE 2 CAPSULES IN THE MORNING & 1 CAPSULE AT NIGHT    . ranitidine (ZANTAC) 150 MG tablet Take 150 mg by mouth 2 (two) times daily.    Marland Kitchen  simvastatin (ZOCOR) 20 MG tablet TAKE 1 TABLET BY MOUTH  EVERY MORNING (Patient taking differently: TAKE 1 TABLET BY MOUTH  EVERY NIGHT) 90 tablet 3  . Turmeric 500 MG CAPS Take 500 mg by mouth daily.      Current Facility-Administered Medications  Medication Dose Route Frequency Provider Last Rate Last Dose  . albuterol (PROVENTIL) (2.5 MG/3ML) 0.083% nebulizer solution 2.5 mg  2.5 mg Nebulization Once Burnard Hawthorne, FNP        Allergies:   Latex and Codeine    Social History:  The patient  reports that she quit smoking about 34 years ago. Her smoking use included cigarettes. She has a 15.00 pack-year smoking history. she has never used smokeless tobacco. She reports that she does not drink alcohol  or use drugs.   Family History:  The patient's family history includes Arthritis in her father and mother; Heart disease in her brother and mother; Stroke in her brother.    ROS:  Please see the history of present illness.   Otherwise, review of systems are positive for none.   All other systems are reviewed and negative.    PHYSICAL EXAM: VS:  BP (!) 148/58 (BP Location: Left Arm, Patient Position: Sitting, Cuff Size: Normal)   Pulse 75   Ht 5' (1.524 m)   Wt 150 lb (68 kg)   BMI 29.29 kg/m  , BMI Body mass index is 29.29 kg/m. GEN: Well nourished, well developed, in no acute distress  HEENT: normal  Neck: no JVD, carotid bruits, or masses Cardiac: RRR; no  rubs, or gallops,no edema . 3/6 crescendo decrescendo systolic murmur in the aortic area which is mid peaking.  S2 is preserved. Respiratory:  clear to auscultation bilaterally, normal work of breathing GI: soft, nontender, nondistended, + BS MS: no deformity or atrophy  Skin: warm and dry, no rash Neuro:  Strength and sensation are intact Psych: euthymic mood, full affect   EKG:  EKG is ordered today. The ekg ordered today demonstrates normal sinus rhythm with right bundle branch block.   Recent Labs: 10/19/2017: ALT 18; BUN 28; Creatinine, Ser 1.11; Potassium 4.7; Sodium 136    Lipid Panel    Component Value Date/Time   CHOL 136 10/19/2017 0934   TRIG 105.0 10/19/2017 0934   HDL 44.50 10/19/2017 0934   CHOLHDL 3 10/19/2017 0934   VLDL 21.0 10/19/2017 0934   LDLCALC 71 10/19/2017 0934      Wt Readings from Last 3 Encounters:  11/13/17 150 lb (68 kg)  10/15/17 149 lb 12.8 oz (67.9 kg)  02/02/17 147 lb 9.6 oz (67 kg)       No flowsheet data found.    ASSESSMENT AND PLAN:  1.  Preop cardiovascular evaluation for  shoulder surgery: The patient is overall active with no anginal symptoms.  EKG shows right bundle branch block which is old with no ischemic changes.  She does have moderate valvular disease  including aortic stenosis and  thus she is considered at moderate risk for cardiovascular complications.  I discussed this with the patient and her husband and they are both aware.  Ischemic cardiac testing is not indicated.  2.  Moderate aortic stenosis: This remains stable on echocardiogram.  Currently asymptomatic.  Repeat echocardiogram in 1 year.  3.  Mild to moderate mitral stenosis with mild regurgitation: Continue to monitor.  4. Hyperlipidemia: Currently on simvastatin 20 mg daily.   5. Essential hypertension: Blood pressure is controlled on current medications.  Disposition:   FU with me in 6 months.  Signed,  Kathlyn Sacramento, MD  11/13/2017 12:40 PM    [place]

## 2017-11-16 ENCOUNTER — Telehealth: Payer: Self-pay | Admitting: Cardiovascular Disease

## 2017-11-16 ENCOUNTER — Inpatient Hospital Stay: Admission: RE | Admit: 2017-11-16 | Payer: Medicare Other | Source: Ambulatory Visit

## 2017-11-16 NOTE — Telephone Encounter (Signed)
Patient was seen 3/15 for clearance sx scheduled 3/22 clinic needs to have clearance today    Previous note for clearance:     Emily Filbert, RN  Registered Nurse    Telephone Encounter  Signed  Encounter Date:  10/30/2017          Signed           [] Hide copied text  [] Hover for details      [facility] Pre-operative Risk Assessment    Request for surgical clearance:  1. What type of surgery is being performed? Right Reverse Total Shoulder Arthroplasty   2. When is this surgery scheduled? 11/20/17   3. What type of clearance is required (medical clearance vs. Pharmacy clearance to hold med vs. Both)? Cardiac Clearance  4. Are there any medications that need to be held prior to surgery and how long? Not specified, but the patient appears to be on ASA 81 mg once daily   5. Practice name and name of physician performing surgery? [facility]- Dr. Leim Fabry   6. What is your office phone and fax number? [phone] - phone                       [phone]- fax   7. Anesthesia type (None, local, MAC, general) ? Not specified   Alvis Lemmings 10/30/2017, 3:30 PM  _________________________________________________________________   (provider comments below)

## 2017-11-16 NOTE — Telephone Encounter (Signed)
Routed March 15 OV notes and 3/14 echo to [facility] [phone] Notified front desk receptionist at [place]

## 2017-11-17 ENCOUNTER — Encounter
Admission: RE | Admit: 2017-11-17 | Discharge: 2017-11-17 | Disposition: A | Discharge status: Home / Self Care | Payer: Medicare Other | Source: Ambulatory Visit | Attending: Orthopedic Surgery | Admitting: Orthopedic Surgery

## 2017-11-17 ENCOUNTER — Other Ambulatory Visit: Payer: Self-pay

## 2017-11-17 DIAGNOSIS — Z87891 Personal history of nicotine dependence: Secondary | ICD-10-CM | POA: Diagnosis not present

## 2017-11-17 DIAGNOSIS — M25511 Pain in right shoulder: Secondary | ICD-10-CM | POA: Diagnosis not present

## 2017-11-17 DIAGNOSIS — M12811 Other specific arthropathies, not elsewhere classified, right shoulder: Secondary | ICD-10-CM | POA: Diagnosis not present

## 2017-11-17 DIAGNOSIS — I739 Peripheral vascular disease, unspecified: Secondary | ICD-10-CM | POA: Diagnosis not present

## 2017-11-17 DIAGNOSIS — Z01812 Encounter for preprocedural laboratory examination: Secondary | ICD-10-CM

## 2017-11-17 DIAGNOSIS — Z01810 Encounter for preprocedural cardiovascular examination: Secondary | ICD-10-CM

## 2017-11-17 DIAGNOSIS — Z96611 Presence of right artificial shoulder joint: Secondary | ICD-10-CM | POA: Diagnosis not present

## 2017-11-17 DIAGNOSIS — M75121 Complete rotator cuff tear or rupture of right shoulder, not specified as traumatic: Secondary | ICD-10-CM | POA: Diagnosis not present

## 2017-11-17 DIAGNOSIS — I10 Essential (primary) hypertension: Secondary | ICD-10-CM | POA: Diagnosis not present

## 2017-11-17 DIAGNOSIS — M7521 Bicipital tendinitis, right shoulder: Secondary | ICD-10-CM | POA: Diagnosis not present

## 2017-11-17 DIAGNOSIS — M19011 Primary osteoarthritis, right shoulder: Secondary | ICD-10-CM | POA: Diagnosis not present

## 2017-11-17 DIAGNOSIS — G8918 Other acute postprocedural pain: Secondary | ICD-10-CM | POA: Diagnosis not present

## 2017-11-17 DIAGNOSIS — E7800 Pure hypercholesterolemia, unspecified: Secondary | ICD-10-CM | POA: Diagnosis not present

## 2017-11-17 DIAGNOSIS — Z471 Aftercare following joint replacement surgery: Secondary | ICD-10-CM | POA: Diagnosis not present

## 2017-11-17 DIAGNOSIS — K219 Gastro-esophageal reflux disease without esophagitis: Secondary | ICD-10-CM | POA: Diagnosis not present

## 2017-11-17 HISTORY — DX: Adverse effect of unspecified anesthetic, initial encounter: T41.45XA

## 2017-11-17 HISTORY — DX: Dyspnea, unspecified: R06.00

## 2017-11-17 LAB — CBC WITH DIFFERENTIAL/PLATELET
Basophils Absolute: 0.1 10*3/uL (ref 0–0.1)
Basophils Relative: 1 %
EOS ABS: 0.2 10*3/uL (ref 0–0.7)
Eosinophils Relative: 2 %
HCT: 37.7 % (ref 35.0–47.0)
HEMOGLOBIN: 12.8 g/dL (ref 12.0–16.0)
LYMPHS ABS: 3 10*3/uL (ref 1.0–3.6)
LYMPHS PCT: 38 %
MCH: 31.3 pg (ref 26.0–34.0)
MCHC: 33.9 g/dL (ref 32.0–36.0)
MCV: 92.3 fL (ref 80.0–100.0)
MONOS PCT: 11 %
Monocytes Absolute: 0.9 10*3/uL (ref 0.2–0.9)
NEUTROS PCT: 48 %
Neutro Abs: 3.8 10*3/uL (ref 1.4–6.5)
Platelets: 204 10*3/uL (ref 150–440)
RBC: 4.08 MIL/uL (ref 3.80–5.20)
RDW: 13.3 % (ref 11.5–14.5)
WBC: 7.9 10*3/uL (ref 3.6–11.0)

## 2017-11-17 LAB — PROTIME-INR
INR: 0.95
PROTHROMBIN TIME: 12.6 s (ref 11.4–15.2)

## 2017-11-17 LAB — URINALYSIS, ROUTINE W REFLEX MICROSCOPIC
BILIRUBIN URINE: NEGATIVE
GLUCOSE, UA: NEGATIVE mg/dL
HGB URINE DIPSTICK: NEGATIVE
Ketones, ur: NEGATIVE mg/dL
LEUKOCYTES UA: NEGATIVE
NITRITE: POSITIVE — AB
PROTEIN: NEGATIVE mg/dL
Specific Gravity, Urine: 1.013 (ref 1.005–1.030)
pH: 7 (ref 5.0–8.0)

## 2017-11-17 LAB — SURGICAL PCR SCREEN
MRSA, PCR: NEGATIVE
Staphylococcus aureus: NEGATIVE

## 2017-11-17 LAB — BASIC METABOLIC PANEL
Anion gap: 11 (ref 5–15)
BUN: 25 mg/dL — AB (ref 6–20)
CO2: 27 mmol/L (ref 22–32)
CREATININE: 1.06 mg/dL — AB (ref 0.44–1.00)
Calcium: 10 mg/dL (ref 8.9–10.3)
Chloride: 102 mmol/L (ref 101–111)
GFR calc Af Amer: 55 mL/min — ABNORMAL LOW (ref >60)
GFR calc non Af Amer: 48 mL/min — ABNORMAL LOW (ref >60)
GLUCOSE: 110 mg/dL — AB (ref 65–99)
POTASSIUM: 4.1 mmol/L (ref 3.5–5.1)
Sodium: 140 mmol/L (ref 135–145)

## 2017-11-17 LAB — APTT: aPTT: 35 seconds (ref 24–36)

## 2017-11-17 NOTE — Patient Instructions (Signed)
Your procedure is scheduled on: Friday, MARCH 22ND  Report to [facility]  To find out your arrival time please call [phone] between               1PM - 3PM on Thursday, MARCH 21ST  Remember: Instructions that are not followed completely may result in serious  medical risk, up to and including death, or upon the discretion of your surgeon  and anesthesiologist your surgery may need to be rescheduled.     _X__ 1. Do not eat food after midnight the night before your procedure.                 No gum chewing, lozengers or hard candies.                  You may drink clear liquids up to 2 hours before you are scheduled to arrive                  for your surgery- DO not drink clear                 liquids within 2 hours of the start of your surgery.                  Clear Liquids include:  water, apple juice without pulp, clear carbohydrate                 drink such as Clearfast of Gatorade, Black Coffee or Tea (Do not add                 anything to coffee or tea).  __X__2.  On the morning of surgery brush your teeth with toothpaste and water,                         you may rinse your mouth with mouthwash if you wish.                               Do not swallow any toothpaste of mouthwash.     _X__ 3.  No Alcohol for 24 hours before or after surgery.   _X__ 4.  Do Not Smoke or use e-cigarettes For 24 Hours Prior to Your Surgery.                 Do not use any chewable tobacco products for at least 6 hours prior to                 surgery.  ____  5.  Bring all medications with you on the day of surgery if instructed.   _x___  6.  Notify your doctor if there is any change in your medical condition      (cold, fever, infections).     Do not wear jewelry, make-up, hairpins, clips or nail polish. Do not wear lotions, powders, or perfumes. You may wear deodorant. Do not shave 48 hours prior to surgery. Men may shave face and  neck. Do not bring valuables to the hospital.    [facility] is not responsible for any belongings or valuables.  Contacts, dentures or bridgework may not be worn into surgery. Leave your suitcase in the car. After surgery it may be brought to your room. For patients admitted to the hospital, discharge time is determined by your treatment team.  Patients discharged the day of surgery will not be allowed to drive home.   Please read over the following fact sheets that you were given:          Preparing for surgery                    MRSA: STOP THE SPREAD   ____ Take these medicines the morning of surgery with A SIP OF WATER:    1. ZANTAC  2.   3.   4.    ____ Fleet Enema (as directed)   __X__ Use CHG Soap as directed  ____ Stop ALL ASPIRIN PRODUCTS NOW!!            THIS INCLUDES BC POWDERS / EXCEDRIN   _X___ Stop Anti-inflammatories NOW!!             THIS INCLUDES IBUPROFEN / MOTRIN / ADVIL / ALEVE / NAPROSYN   _X___ Stop supplements until after surgery NOW                THIS INCLUDES MULTIVITS, FISH OIL AND TUMERIC             YOU CAN CONTINUE B12, MAGNESIUM AND COLACE JUST DO NOT                 TAKE ON DAY OF SURGERY  ____ Bring C-Pap to the hospital.   CONTINUE TAKING ZOCOR  AS USUAL AT NIGHT  CONTINUE TAKING LOTREL AS PRESCRIBED BUT do not take         On morning of surgery  YOU MAY TAKE TYLENOL AS NEEDED

## 2017-11-18 ENCOUNTER — Other Ambulatory Visit: Payer: Medicare Other

## 2017-11-18 NOTE — Pre-Procedure Instructions (Signed)
LABS FAXED TO DR PATEL OFFICE

## 2017-11-19 LAB — URINE CULTURE: Culture: >=100000 — AB

## 2017-11-19 MED ORDER — CEFAZOLIN SODIUM-DEXTROSE 2-4 GM/100ML-% IV SOLN
2.0000 g | Freq: Once | INTRAVENOUS | Status: AC
  Administered 2017-11-20: 2 g via INTRAVENOUS

## 2017-11-20 ENCOUNTER — Encounter: Payer: Self-pay | Admitting: *Deleted

## 2017-11-20 ENCOUNTER — Encounter
Admission: RE | Disposition: A | Discharge status: Home / Self Care | Payer: Self-pay | Source: Ambulatory Visit | Attending: Orthopedic Surgery

## 2017-11-20 ENCOUNTER — Inpatient Hospital Stay: Payer: Medicare Other | Admitting: Anesthesiology

## 2017-11-20 ENCOUNTER — Other Ambulatory Visit: Payer: Self-pay

## 2017-11-20 ENCOUNTER — Inpatient Hospital Stay: Payer: Medicare Other

## 2017-11-20 ENCOUNTER — Inpatient Hospital Stay
Admission: RE | Admit: 2017-11-20 | Discharge: 2017-11-21 | DRG: 483 | Disposition: A | Discharge status: Home / Self Care | Payer: Medicare Other | Source: Ambulatory Visit | Attending: Orthopedic Surgery | Admitting: Orthopedic Surgery

## 2017-11-20 DIAGNOSIS — M25511 Pain in right shoulder: Secondary | ICD-10-CM | POA: Diagnosis present

## 2017-11-20 DIAGNOSIS — J40 Bronchitis, not specified as acute or chronic: Secondary | ICD-10-CM

## 2017-11-20 DIAGNOSIS — E7800 Pure hypercholesterolemia, unspecified: Secondary | ICD-10-CM | POA: Diagnosis present

## 2017-11-20 DIAGNOSIS — Z87891 Personal history of nicotine dependence: Secondary | ICD-10-CM

## 2017-11-20 DIAGNOSIS — K219 Gastro-esophageal reflux disease without esophagitis: Secondary | ICD-10-CM | POA: Diagnosis present

## 2017-11-20 DIAGNOSIS — I10 Essential (primary) hypertension: Secondary | ICD-10-CM | POA: Diagnosis present

## 2017-11-20 DIAGNOSIS — M19011 Primary osteoarthritis, right shoulder: Secondary | ICD-10-CM | POA: Diagnosis present

## 2017-11-20 DIAGNOSIS — Z96619 Presence of unspecified artificial shoulder joint: Secondary | ICD-10-CM

## 2017-11-20 HISTORY — PX: REVERSE SHOULDER ARTHROPLASTY: SHX5054

## 2017-11-20 HISTORY — PX: BICEPT TENODESIS: SHX5116

## 2017-11-20 SURGERY — ARTHROPLASTY, SHOULDER, TOTAL, REVERSE
Anesthesia: Regional | Site: Shoulder | Laterality: Right | Wound class: Clean

## 2017-11-20 MED ORDER — BISACODYL 10 MG RE SUPP: 10.0000 mg | Freq: Every day | RECTAL | Status: DC | PRN

## 2017-11-20 MED ORDER — FENTANYL CITRATE (PF) 100 MCG/2ML IJ SOLN
INTRAMUSCULAR | Status: AC
  Administered 2017-11-20: 50 ug via INTRAVENOUS
  Filled 2017-11-20: qty 2

## 2017-11-20 MED ORDER — FENTANYL CITRATE (PF) 100 MCG/2ML IJ SOLN
INTRAMUSCULAR | Status: AC
  Filled 2017-11-20: qty 2

## 2017-11-20 MED ORDER — LIDOCAINE HCL (PF) 1 % IJ SOLN
INTRAMUSCULAR | Status: DC | PRN
  Administered 2017-11-20: .8 mL via SUBCUTANEOUS

## 2017-11-20 MED ORDER — EPINEPHRINE PF 1 MG/ML IJ SOLN
INTRAMUSCULAR | Status: AC
  Filled 2017-11-20: qty 1

## 2017-11-20 MED ORDER — ROCURONIUM BROMIDE 100 MG/10ML IV SOLN
INTRAVENOUS | Status: DC | PRN
  Administered 2017-11-20: 25 mg via INTRAVENOUS
  Administered 2017-11-20: 50 mg via INTRAVENOUS

## 2017-11-20 MED ORDER — CEFAZOLIN SODIUM-DEXTROSE 2-4 GM/100ML-% IV SOLN
2.0000 g | Freq: Four times a day (QID) | INTRAVENOUS | Status: AC
  Administered 2017-11-20 – 2017-11-21 (×3): 2 g via INTRAVENOUS
  Filled 2017-11-20 (×3): qty 100

## 2017-11-20 MED ORDER — PROPOFOL 10 MG/ML IV BOLUS
INTRAVENOUS | Status: AC
  Filled 2017-11-20: qty 20

## 2017-11-20 MED ORDER — PROPOFOL 10 MG/ML IV BOLUS
INTRAVENOUS | Status: DC | PRN
  Administered 2017-11-20: 50 mg via INTRAVENOUS
  Administered 2017-11-20: 150 mg via INTRAVENOUS

## 2017-11-20 MED ORDER — BUPIVACAINE-EPINEPHRINE (PF) 0.25% -1:200000 IJ SOLN
INTRAMUSCULAR | Status: DC | PRN
  Administered 2017-11-20: 27 mL

## 2017-11-20 MED ORDER — SUGAMMADEX SODIUM 200 MG/2ML IV SOLN
INTRAVENOUS | Status: DC | PRN
  Administered 2017-11-20: 135.2 mg via INTRAVENOUS

## 2017-11-20 MED ORDER — MIDAZOLAM HCL 2 MG/2ML IJ SOLN
INTRAMUSCULAR | Status: AC
  Filled 2017-11-20: qty 2

## 2017-11-20 MED ORDER — PHENYLEPHRINE HCL 10 MG/ML IJ SOLN
INTRAMUSCULAR | Status: DC | PRN
  Administered 2017-11-20: 25 ug/min via INTRAVENOUS

## 2017-11-20 MED ORDER — CEFAZOLIN SODIUM-DEXTROSE 2-4 GM/100ML-% IV SOLN
INTRAVENOUS | Status: AC
  Filled 2017-11-20: qty 100

## 2017-11-20 MED ORDER — FAMOTIDINE 20 MG PO TABS
ORAL_TABLET | ORAL | Status: AC
  Filled 2017-11-20: qty 1

## 2017-11-20 MED ORDER — BUPIVACAINE LIPOSOME 1.3 % IJ SUSP
INTRAMUSCULAR | Status: AC
  Filled 2017-11-20: qty 20

## 2017-11-20 MED ORDER — DIPHENHYDRAMINE HCL 12.5 MG/5ML PO ELIX: 12.5000 mg | ORAL_SOLUTION | ORAL | Status: DC | PRN

## 2017-11-20 MED ORDER — SIMVASTATIN 20 MG PO TABS
20.0000 mg | ORAL_TABLET | Freq: Every day | ORAL | Status: DC
  Administered 2017-11-20: 20 mg via ORAL
  Filled 2017-11-20: qty 1

## 2017-11-20 MED ORDER — ONDANSETRON HCL 4 MG/2ML IJ SOLN
INTRAMUSCULAR | Status: AC
  Filled 2017-11-20: qty 2

## 2017-11-20 MED ORDER — FENTANYL CITRATE (PF) 100 MCG/2ML IJ SOLN
50.0000 ug | Freq: Once | INTRAMUSCULAR | Status: AC
  Administered 2017-11-20: 50 ug via INTRAVENOUS

## 2017-11-20 MED ORDER — DEXAMETHASONE SODIUM PHOSPHATE 10 MG/ML IJ SOLN
INTRAMUSCULAR | Status: AC
  Filled 2017-11-20: qty 1

## 2017-11-20 MED ORDER — ASPIRIN EC 325 MG PO TBEC
325.0000 mg | DELAYED_RELEASE_TABLET | Freq: Every day | ORAL | Status: DC
  Administered 2017-11-21: 325 mg via ORAL
  Filled 2017-11-20: qty 1

## 2017-11-20 MED ORDER — VANCOMYCIN HCL 1000 MG IV SOLR
INTRAVENOUS | Status: AC
  Filled 2017-11-20: qty 1000

## 2017-11-20 MED ORDER — METHOCARBAMOL 500 MG PO TABS
500.0000 mg | ORAL_TABLET | Freq: Four times a day (QID) | ORAL | Status: DC | PRN
  Administered 2017-11-20: 500 mg via ORAL
  Filled 2017-11-20: qty 1

## 2017-11-20 MED ORDER — METHOCARBAMOL 500 MG PO TABS: 500.0000 mg | ORAL_TABLET | Freq: Four times a day (QID) | ORAL | 0 refills | Status: DC | PRN

## 2017-11-20 MED ORDER — FAMOTIDINE 20 MG PO TABS
20.0000 mg | ORAL_TABLET | Freq: Once | ORAL | Status: AC
  Administered 2017-11-20: 20 mg via ORAL

## 2017-11-20 MED ORDER — BUPIVACAINE-EPINEPHRINE (PF) 0.25% -1:200000 IJ SOLN
INTRAMUSCULAR | Status: AC
  Filled 2017-11-20: qty 10

## 2017-11-20 MED ORDER — BUPIVACAINE HCL (PF) 0.5 % IJ SOLN
INTRAMUSCULAR | Status: AC
  Filled 2017-11-20: qty 10

## 2017-11-20 MED ORDER — ASPIRIN 325 MG PO TBEC: 325.0000 mg | DELAYED_RELEASE_TABLET | Freq: Every day | ORAL | 0 refills | Status: DC

## 2017-11-20 MED ORDER — FAMOTIDINE 20 MG PO TABS
20.0000 mg | ORAL_TABLET | Freq: Two times a day (BID) | ORAL | Status: DC
  Administered 2017-11-20 – 2017-11-21 (×3): 20 mg via ORAL
  Filled 2017-11-20 (×3): qty 1

## 2017-11-20 MED ORDER — GLYCOPYRROLATE 0.2 MG/ML IJ SOLN
INTRAMUSCULAR | Status: DC | PRN
  Administered 2017-11-20: 0.2 mg via INTRAVENOUS

## 2017-11-20 MED ORDER — LIDOCAINE HCL (CARDIAC) 20 MG/ML IV SOLN
INTRAVENOUS | Status: DC | PRN
  Administered 2017-11-20: 80 mg via INTRAVENOUS

## 2017-11-20 MED ORDER — FENTANYL CITRATE (PF) 100 MCG/2ML IJ SOLN: 25.0000 ug | INTRAMUSCULAR | Status: DC | PRN

## 2017-11-20 MED ORDER — PHENOL 1.4 % MT LIQD
1.0000 | OROMUCOSAL | Status: DC | PRN
  Filled 2017-11-20: qty 177

## 2017-11-20 MED ORDER — MIDAZOLAM HCL 2 MG/2ML IJ SOLN
1.0000 mg | Freq: Once | INTRAMUSCULAR | Status: AC
  Administered 2017-11-20: 1 mg via INTRAVENOUS

## 2017-11-20 MED ORDER — LIDOCAINE HCL (PF) 2 % IJ SOLN
INTRAMUSCULAR | Status: AC
  Filled 2017-11-20: qty 10

## 2017-11-20 MED ORDER — NEOMYCIN-POLYMYXIN B GU 40-200000 IR SOLN
Status: AC
  Filled 2017-11-20: qty 20

## 2017-11-20 MED ORDER — TRAMADOL HCL 50 MG PO TABS
50.0000 mg | ORAL_TABLET | Freq: Four times a day (QID) | ORAL | Status: DC | PRN
  Administered 2017-11-20: 50 mg via ORAL
  Filled 2017-11-20: qty 1

## 2017-11-20 MED ORDER — SODIUM CHLORIDE 0.9 % IV SOLN
INTRAVENOUS | Status: DC
  Administered 2017-11-20: 13:00:00 via INTRAVENOUS
  Administered 2017-11-21: 75 mL/h via INTRAVENOUS

## 2017-11-20 MED ORDER — MIDAZOLAM HCL 2 MG/2ML IJ SOLN
INTRAMUSCULAR | Status: DC | PRN
  Administered 2017-11-20 (×2): 1 mg via INTRAVENOUS

## 2017-11-20 MED ORDER — SENNOSIDES-DOCUSATE SODIUM 8.6-50 MG PO TABS: 1.0000 | ORAL_TABLET | Freq: Every evening | ORAL | Status: DC | PRN

## 2017-11-20 MED ORDER — ALUM & MAG HYDROXIDE-SIMETH 200-200-20 MG/5ML PO SUSP: 30.0000 mL | ORAL | Status: DC | PRN

## 2017-11-20 MED ORDER — ONDANSETRON HCL 4 MG/2ML IJ SOLN
4.0000 mg | Freq: Four times a day (QID) | INTRAMUSCULAR | Status: DC | PRN
  Administered 2017-11-20: 4 mg via INTRAVENOUS
  Filled 2017-11-20: qty 2

## 2017-11-20 MED ORDER — ONDANSETRON HCL 4 MG PO TABS: 4.0000 mg | ORAL_TABLET | Freq: Four times a day (QID) | ORAL | 0 refills | Status: DC | PRN

## 2017-11-20 MED ORDER — PHENYLEPHRINE HCL 10 MG/ML IJ SOLN
INTRAMUSCULAR | Status: AC
  Filled 2017-11-20: qty 1

## 2017-11-20 MED ORDER — METOCLOPRAMIDE HCL 10 MG PO TABS: 5.0000 mg | ORAL_TABLET | Freq: Three times a day (TID) | ORAL | Status: DC | PRN

## 2017-11-20 MED ORDER — MENTHOL 3 MG MT LOZG
1.0000 | LOZENGE | OROMUCOSAL | Status: DC | PRN
  Filled 2017-11-20: qty 9

## 2017-11-20 MED ORDER — ONDANSETRON HCL 4 MG/2ML IJ SOLN
INTRAMUSCULAR | Status: DC | PRN
  Administered 2017-11-20: 4 mg via INTRAVENOUS

## 2017-11-20 MED ORDER — VANCOMYCIN HCL 1000 MG IV SOLR
INTRAVENOUS | Status: DC | PRN
  Administered 2017-11-20: 1000 mg

## 2017-11-20 MED ORDER — OXYCODONE HCL 5 MG PO TABS
5.0000 mg | ORAL_TABLET | ORAL | Status: DC | PRN
  Filled 2017-11-20: qty 1

## 2017-11-20 MED ORDER — BUPIVACAINE HCL (PF) 0.5 % IJ SOLN
INTRAMUSCULAR | Status: DC | PRN
  Administered 2017-11-20: 10 mL via PERINEURAL

## 2017-11-20 MED ORDER — OXYCODONE HCL 5 MG PO TABS: 10.0000 mg | ORAL_TABLET | ORAL | Status: DC | PRN

## 2017-11-20 MED ORDER — DOCUSATE SODIUM 100 MG PO CAPS
100.0000 mg | ORAL_CAPSULE | Freq: Two times a day (BID) | ORAL | Status: DC
  Administered 2017-11-20 – 2017-11-21 (×3): 100 mg via ORAL
  Filled 2017-11-20 (×3): qty 1

## 2017-11-20 MED ORDER — BENAZEPRIL HCL 20 MG PO TABS
20.0000 mg | ORAL_TABLET | Freq: Every day | ORAL | Status: DC
  Administered 2017-11-20: 20 mg via ORAL
  Filled 2017-11-20 (×2): qty 1

## 2017-11-20 MED ORDER — METOCLOPRAMIDE HCL 5 MG/ML IJ SOLN: 5.0000 mg | Freq: Three times a day (TID) | INTRAMUSCULAR | Status: DC | PRN

## 2017-11-20 MED ORDER — METHOCARBAMOL 1000 MG/10ML IJ SOLN
500.0000 mg | Freq: Four times a day (QID) | INTRAVENOUS | Status: DC | PRN
  Filled 2017-11-20: qty 5

## 2017-11-20 MED ORDER — ADULT MULTIVITAMIN W/MINERALS CH
1.0000 | ORAL_TABLET | Freq: Every day | ORAL | Status: DC
  Administered 2017-11-20 – 2017-11-21 (×2): 1 via ORAL
  Filled 2017-11-20 (×2): qty 1

## 2017-11-20 MED ORDER — MIDAZOLAM HCL 2 MG/2ML IJ SOLN
INTRAMUSCULAR | Status: AC
  Administered 2017-11-20: 1 mg via INTRAVENOUS
  Filled 2017-11-20: qty 2

## 2017-11-20 MED ORDER — CEFAZOLIN SODIUM-DEXTROSE 2-4 GM/100ML-% IV SOLN: 2.0000 g | Freq: Four times a day (QID) | INTRAVENOUS | Status: DC

## 2017-11-20 MED ORDER — NEOMYCIN-POLYMYXIN B GU 40-200000 IR SOLN
Status: DC | PRN
  Administered 2017-11-20: 16 mL

## 2017-11-20 MED ORDER — HYDROMORPHONE HCL 1 MG/ML IJ SOLN
0.5000 mg | INTRAMUSCULAR | Status: DC | PRN
  Administered 2017-11-20 – 2017-11-21 (×2): 1 mg via INTRAVENOUS
  Filled 2017-11-20: qty 1

## 2017-11-20 MED ORDER — LACTATED RINGERS IV SOLN
INTRAVENOUS | Status: DC | PRN
  Administered 2017-11-20 (×2): via INTRAVENOUS

## 2017-11-20 MED ORDER — BUPIVACAINE LIPOSOME 1.3 % IJ SUSP
INTRAMUSCULAR | Status: DC | PRN
  Administered 2017-11-20: 20 mL via PERINEURAL

## 2017-11-20 MED ORDER — ALBUTEROL SULFATE (2.5 MG/3ML) 0.083% IN NEBU: 2.5000 mg | INHALATION_SOLUTION | Freq: Once | RESPIRATORY_TRACT | Status: DC

## 2017-11-20 MED ORDER — SEVOFLURANE IN SOLN
RESPIRATORY_TRACT | Status: AC
  Filled 2017-11-20: qty 250

## 2017-11-20 MED ORDER — ACETAMINOPHEN 500 MG PO TABS
1000.0000 mg | ORAL_TABLET | Freq: Three times a day (TID) | ORAL | Status: DC
  Administered 2017-11-20 – 2017-11-21 (×3): 1000 mg via ORAL
  Filled 2017-11-20 (×3): qty 2

## 2017-11-20 MED ORDER — FENTANYL CITRATE (PF) 100 MCG/2ML IJ SOLN
INTRAMUSCULAR | Status: DC | PRN
  Administered 2017-11-20 (×2): 50 ug via INTRAVENOUS

## 2017-11-20 MED ORDER — VITAMIN B-12 1000 MCG PO TABS
1000.0000 ug | ORAL_TABLET | Freq: Every day | ORAL | Status: DC
  Administered 2017-11-20 – 2017-11-21 (×2): 1000 ug via ORAL
  Filled 2017-11-20 (×2): qty 1

## 2017-11-20 MED ORDER — ONDANSETRON HCL 4 MG PO TABS: 4.0000 mg | ORAL_TABLET | Freq: Four times a day (QID) | ORAL | Status: DC | PRN

## 2017-11-20 MED ORDER — OXYCODONE HCL 5 MG PO TABS: 5.0000 mg | ORAL_TABLET | ORAL | 0 refills | Status: DC | PRN

## 2017-11-20 MED ORDER — DEXAMETHASONE SODIUM PHOSPHATE 10 MG/ML IJ SOLN
INTRAMUSCULAR | Status: DC | PRN
  Administered 2017-11-20: 10 mg via INTRAVENOUS

## 2017-11-20 MED ORDER — AMLODIPINE BESY-BENAZEPRIL HCL 5-20 MG PO CAPS: 1.0000 | ORAL_CAPSULE | Freq: Every day | ORAL | Status: DC

## 2017-11-20 MED ORDER — EPHEDRINE SULFATE 50 MG/ML IJ SOLN
INTRAMUSCULAR | Status: AC
  Filled 2017-11-20: qty 1

## 2017-11-20 MED ORDER — ONDANSETRON HCL 4 MG/2ML IJ SOLN
4.0000 mg | Freq: Once | INTRAMUSCULAR | Status: DC | PRN
Start: 2017-11-20 — End: 2017-11-20

## 2017-11-20 MED ORDER — AMLODIPINE BESYLATE 5 MG PO TABS
5.0000 mg | ORAL_TABLET | Freq: Every day | ORAL | Status: DC
  Administered 2017-11-20: 5 mg via ORAL
  Filled 2017-11-20: qty 1

## 2017-11-20 MED ORDER — LACTATED RINGERS IV SOLN
INTRAVENOUS | Status: DC
Start: 2017-11-20 — End: 2017-11-20
  Administered 2017-11-20: 07:00:00 via INTRAVENOUS

## 2017-11-20 MED ORDER — LIDOCAINE HCL (PF) 1 % IJ SOLN
INTRAMUSCULAR | Status: AC
  Filled 2017-11-20: qty 5

## 2017-11-20 MED ORDER — ROCURONIUM BROMIDE 50 MG/5ML IV SOLN
INTRAVENOUS | Status: AC
  Filled 2017-11-20: qty 1

## 2017-11-20 SURGICAL SUPPLY — 76 items
BASEPLATE P2 COATD GLND 6.5X30 (Shoulder) ×1 IMPLANT
BIT DRILL GUIDE PATIENT MATCH (MISCELLANEOUS) ×1 IMPLANT
BLADE SAGITTAL WIDE XTHICK NO (BLADE) ×3 IMPLANT
CANISTER SUCT 1200ML W/VALVE (MISCELLANEOUS) ×3 IMPLANT
CANISTER SUCT 3000ML PPV (MISCELLANEOUS) ×6 IMPLANT
CHLORAPREP W/TINT 26ML (MISCELLANEOUS) ×3 IMPLANT
CLOSURE WOUND 1/2 X4 (GAUZE/BANDAGES/DRESSINGS) ×1
CNTNR SPEC 2.5X3XGRAD LEK (MISCELLANEOUS) ×1
CONT SPEC 4OZ STER OR WHT (MISCELLANEOUS) ×2
CONTAINER SPEC 2.5X3XGRAD LEK (MISCELLANEOUS) ×1 IMPLANT
COOLER POLAR GLACIER W/PUMP (MISCELLANEOUS) ×3 IMPLANT
DRAPE IMP U-DRAPE 54X76 (DRAPES) ×6 IMPLANT
DRAPE INCISE IOBAN 66X45 STRL (DRAPES) ×6 IMPLANT
DRAPE SHEET LG 3/4 BI-LAMINATE (DRAPES) ×6 IMPLANT
DRAPE TABLE BACK 80X90 (DRAPES) ×3 IMPLANT
DRAPE U-SHAPE 47X51 STRL (DRAPES) ×3 IMPLANT
DRILL GUIDE PATIENT MATCH (MISCELLANEOUS) ×3
DRSG OPSITE POSTOP 4X8 (GAUZE/BANDAGES/DRESSINGS) ×3 IMPLANT
DRSG TEGADERM 2-3/8X2-3/4 SM (GAUZE/BANDAGES/DRESSINGS) ×3 IMPLANT
ELECT BLADE 6.5 EXT (BLADE) IMPLANT
ELECT CAUTERY BLADE 6.4 (BLADE) ×3 IMPLANT
ELECT REM PT RETURN 9FT ADLT (ELECTROSURGICAL) ×3
ELECTRODE REM PT RTRN 9FT ADLT (ELECTROSURGICAL) ×1 IMPLANT
EVACUATOR 1/8 PVC DRAIN (DRAIN) ×3 IMPLANT
GAUZE PETRO XEROFOAM 1X8 (MISCELLANEOUS) ×3 IMPLANT
GLOVE BIOGEL PI IND STRL 8 (GLOVE) ×2 IMPLANT
GLOVE BIOGEL PI INDICATOR 8 (GLOVE) ×4
GLOVE SURG ORTHO 8.0 STRL STRW (GLOVE) ×6 IMPLANT
GOWN STRL REUS W/ TWL LRG LVL3 (GOWN DISPOSABLE) ×2 IMPLANT
GOWN STRL REUS W/ TWL XL LVL3 (GOWN DISPOSABLE) ×1 IMPLANT
GOWN STRL REUS W/TWL LRG LVL3 (GOWN DISPOSABLE) ×4
GOWN STRL REUS W/TWL XL LVL3 (GOWN DISPOSABLE) ×2
HEMOVAC 400CC 10FR (MISCELLANEOUS) ×3 IMPLANT
HOOD PEEL AWAY FLYTE STAYCOOL (MISCELLANEOUS) ×9 IMPLANT
INSERT SMALL SOCKET 32MM NEU (Insert) ×3 IMPLANT
KIT STABILIZATION SHOULDER (MISCELLANEOUS) ×3 IMPLANT
MASK FACE SPIDER DISP (MASK) ×3 IMPLANT
MAT BLUE FLOOR 46X72 FLO (MISCELLANEOUS) ×3 IMPLANT
NDL SAFETY ECLIPSE 18X1.5 (NEEDLE) ×3 IMPLANT
NEEDLE HYPO 18GX1.5 SHARP (NEEDLE) ×6
NEEDLE REVERSE CUT 1/2 CRC (NEEDLE) ×3 IMPLANT
NEEDLE SPNL 20GX3.5 QUINCKE YW (NEEDLE) ×3 IMPLANT
NS IRRIG 1000ML POUR BTL (IV SOLUTION) ×3 IMPLANT
P2 COATDE GLNOID BSEPLT 6.5X30 (Shoulder) ×3 IMPLANT
PACK ARTHROSCOPY SHOULDER (MISCELLANEOUS) ×3 IMPLANT
PAD WRAPON POLAR SHDR UNIV (MISCELLANEOUS) ×1 IMPLANT
PASSER SUT SWANSON 36MM LOOP (INSTRUMENTS) IMPLANT
PULSAVAC PLUS IRRIG FAN TIP (DISPOSABLE) ×3
RETRIEVER SUT HEWSON (MISCELLANEOUS) IMPLANT
SCREW BONE LOCKING RSP 5.0X34 (Screw) ×6 IMPLANT
SCREW BONE RSP LOCK 5X26 (Screw) ×1 IMPLANT
SCREW BONE RSP LOCK 5X34 (Screw) ×2 IMPLANT
SCREW BONE RSP LOCKING 5.0X26 (Screw) ×3 IMPLANT
SCREW RETAIN W/HEAD 4MM OFFSET (Shoulder) ×3 IMPLANT
SLING ULTRA II LG (MISCELLANEOUS) ×3 IMPLANT
SLING ULTRA II M (MISCELLANEOUS) ×3 IMPLANT
SPONGE GAUZE 2X2 8PLY STER LF (GAUZE/BANDAGES/DRESSINGS) ×1
SPONGE GAUZE 2X2 8PLY STRL LF (GAUZE/BANDAGES/DRESSINGS) ×2 IMPLANT
SPONGE LAP 18X18 5 PK (GAUZE/BANDAGES/DRESSINGS) ×12 IMPLANT
STAPLER SKIN PROX 35W (STAPLE) IMPLANT
STEM REV PRIMARY 8X108 (Shoulder) ×3 IMPLANT
STRAP SAFETY 5IN WIDE (MISCELLANEOUS) ×6 IMPLANT
STRIP CLOSURE SKIN 1/2X4 (GAUZE/BANDAGES/DRESSINGS) ×2 IMPLANT
SUT FIBERWIRE #2 38 BLUE 1/2 (SUTURE) ×12
SUT MNCRL AB 4-0 PS2 18 (SUTURE) IMPLANT
SUT PROLENE 6 0 P 1 18 (SUTURE) ×3 IMPLANT
SUT TICRON 2-0 30IN 311381 (SUTURE) ×9 IMPLANT
SUT VIC AB 0 CT1 36 (SUTURE) ×3 IMPLANT
SUT VIC AB 2-0 CT2 27 (SUTURE) ×6 IMPLANT
SUTURE FIBERWR #2 38 BLUE 1/2 (SUTURE) ×4 IMPLANT
SYR 20CC LL (SYRINGE) ×3 IMPLANT
SYR 30ML LL (SYRINGE) ×6 IMPLANT
SYR 5ML LL (SYRINGE) ×3 IMPLANT
TIP FAN IRRIG PULSAVAC PLUS (DISPOSABLE) ×1 IMPLANT
TRAY FOLEY CATH SILVER 16FR LF (SET/KITS/TRAYS/PACK) ×3 IMPLANT
WRAPON POLAR PAD SHDR UNIV (MISCELLANEOUS) ×3

## 2017-11-20 NOTE — Anesthesia Postprocedure Evaluation (Signed)
Anesthesia Post Note  Patient: Robin Alexander  Procedure(s) Performed: REVERSE SHOULDER ARTHROPLASTY (Right Shoulder) BICEPS TENODESIS (Right Shoulder)  Patient location during evaluation: PACU Anesthesia Type: Regional Level of consciousness: awake and alert Pain management: pain level controlled Vital Signs Assessment: post-procedure vital signs reviewed and stable Respiratory status: spontaneous breathing and respiratory function stable Cardiovascular status: stable Anesthetic complications: no     Last Vitals:  Vitals Value Taken Time  BP 110/52 11/20/2017 12:35 PM  Temp 36.8 C 11/20/2017 12:35 PM  Pulse 64 11/20/2017 12:35 PM  Resp 18 11/20/2017 12:35 PM  SpO2 92 % 11/20/2017 12:35 PM    Last Pain:  Vitals:   11/20/17 1235  TempSrc:   PainSc: 0-No pain                 Benney [place] K

## 2017-11-20 NOTE — Anesthesia Post-op Follow-up Note (Signed)
Anesthesia QCDR form completed.        

## 2017-11-20 NOTE — Progress Notes (Signed)
Pt states that her breathing is OK, lungs are clear, no wheezing noted, no shortness of breath noted, nebulizer ordered but at this time patient does not fill that she needs it now. RN aware. Pt states that if she thinks she needs it she will call. Will continue to monitor.

## 2017-11-20 NOTE — H&P (Signed)
Paper H&P to be scanned into permanent record. H&P reviewed. No significant changes noted.  

## 2017-11-20 NOTE — Transfer of Care (Signed)
Immediate Anesthesia Transfer of Care Note  Patient: Robin Alexander  Procedure(s) Performed: REVERSE SHOULDER ARTHROPLASTY (Right Shoulder) BICEPS TENODESIS (Right Shoulder)  Patient Location: PACU  Anesthesia Type:General  Level of Consciousness: sedated  Airway & Oxygen Therapy: Patient Spontanous Breathing  Post-op Assessment: Report given to RN  Post vital signs: stable  Last Vitals:  Vitals Value Taken Time  BP 107/46 11/20/2017 11:05 AM  Temp    Pulse 74 11/20/2017 11:06 AM  Resp 15 11/20/2017 11:06 AM  SpO2 100 % 11/20/2017 11:06 AM  Vitals shown include unvalidated device data.  Last Pain:  Vitals:   11/20/17 0741  TempSrc:   PainSc: 0-No pain         Complications: No apparent anesthesia complications

## 2017-11-20 NOTE — Anesthesia Procedure Notes (Signed)
Anesthesia Regional Block: Interscalene brachial plexus block   Pre-Anesthetic Checklist: ,, timeout performed, Correct Patient, Correct Site, Correct Laterality, Correct Procedure, Correct Position, site marked, Risks and benefits discussed,  Surgical consent,  Pre-op evaluation,  At surgeon's request and post-op pain management  Laterality: Right  Prep: chloraprep       Needles:  Injection technique: Single-shot  Needle Type: Echogenic Stimulator Needle     Needle Length: 9cm  Needle Gauge: 22   Needle insertion depth: 6 cm   Additional Needles:   Procedures:, nerve stimulator,,, ultrasound used (permanent image in chart),,,,   Nerve Stimulator or Paresthesia:  Response: biceps flexion, 0.8 mA,   Additional Responses:   Narrative:  Start time: 11/20/2017 7:29 AM End time: 11/20/2017 7:44 AM Injection made incrementally with aspirations every 5 mL.  Performed by: Personally   Additional Notes: Functioning IV was confirmed and monitors were applied.  A 62mm 22ga Stimuplex needle was used. Sterile prep and drape,hand hygiene and sterile gloves were used.  Negative aspiration and negative test dose prior to incremental administration of local anesthetic. The patient tolerated the procedure well.

## 2017-11-20 NOTE — Op Note (Signed)
Operative Note   SURGERY DATE: 11/20/2017  PRE-OP DIAGNOSIS:  1. Right shoulder rotator cuff arthropathy  POST-OP DIAGNOSIS: 1. Right shoulder rotator cuff arthropathy  PROCEDURES:  1. Right reverse total shoulder arthroplasty 2. Right biceps tenodesis  SURGEON: Cato Mulligan, MD  ASSISTANTS: Reche Dixon, PA  ANESTHESIA: Gen + interscalene block w/Exparil  ESTIMATED BLOOD LOSS:150cc  TOTAL IV FLUIDS: 1240m  IMPLANTS: [employer]: RSP Glenoid Head w/Retaining screw 32-4; Monoblock Reverse Shoulder Baseplate with 68.9FYcentral screw; 3 locking screws into baseplate ([id number] [id number] [id number]; Small Shell Humeral Stem 8 x1056m Neutral Small Socket Insert  INDICATION(S):  [name] a 89.o. female with chronic shoulder pain with inability to lift arm overhead. Imaging consistent with massive, irreparable rotator cuff tear and rotator cuff arthropathy. Conservative measures including medications, cortisone injections, and physical therapy have not provided adequate relief. After discussion of risks, benefits, and alternatives to surgery, the patient elected to proceed with reverse shoulder arthroplasty and biceps tenodesis.  OPERATIVE FINDINGS: massive rotator cuff tear (complete supraspinatus, partial infraspinatus, partial subscapularis)  OPERATIVE REPORT:  I identified Robin Alexander the pre-operative holding area. Informed consent was obtained and the surgical site was marked. I reviewed the risks and benefits of the proposed surgical intervention and the patient (and/or patient's guardian) wished to proceed. An interscalene block was administered by the Anesthesia team. The patient was transferred to the operative suite and general anesthesia was administered. The patient was placed in the beach chair position with the head of the bed elevated approximately 45 degrees. All down side pressure points were appropriately padded. Pre-op exam under anesthesia confirmed  some stiffness and crepitus. Appropriate IV antibioticswere administered. The extremity was then prepped and draped in standard fashion. A time out was performed confirming the correct extremity, correct patient, and correct procedure.   We used the standard deltopectoral incision from the coracoid to ~12cm distal. We found the cephalic vein and took it laterally. We opened the deltopectoral interval widely and placed retractors under the CA ligament in the subacromial space and under the deltoid tendon at its insertion. We then abducted and internally rotated the arm and released the underlying bursa between these retractors, taking care not to damage the circumflex branch of the axillary nerve.   Next, we brought the arm back in adduction at slight forward flexion with external rotation. We opened the clavipectoral fascia lateral to the conjoint tendon. We gently palpated the axillary nerve and verified its position and continuity on both sides of the humerus with a Tug test.  We then cauterized the anterior humeral circumflex ("Three sisters") vessels. The arm was then internally rotated, we cut the falciform ligament at approximately 1 cm of the upper portion of the pectoralis major insertion. Next we unroofed the bicipital groove. The biceps tendon was severely tendinopathic proximally. We proceeded with a soft tissue biceps tenodesis given the pathology of the tendon.  After opening the biceps tendon sheath all the way to the supraglenoid tubercle, we performed a biceps tenodesis with two #2 TiCron sutures to the upper border of the pectoralis major. The proximal portion of the tendon was excised.   At this point, we could see that the supraspinatus was completely torn with a bald humeral head superiorly. The subscapularis also had a partial tear of the superior fibers. We performed a subscapularis peel using electrocautery to remove the anterior capsule and subscapularis off of the humeral head. We  released the inferior capsule from the humerus all  the way to the posterior band of the inferior glenohumeral ligament. When this was complete we gently dislocated the shoulder up into the wound. We removed any osteophytes and made our cut with the appropriate inclination in 30 degrees of retroversion  We then turned our attention back to the glenoid. The proximal humerus was retracted posteriorly. The anterior capsule was dissected free from the subscapularis. The anterior capsule was then excised, exposing the anterior glenoid. We then grasped the labrum and removed it circumferentially. During the glenoid exposure, the axillary nerve was protected the entire time.    A patient-specific guide was used to drill the central guidepin. A tap was placed, matching the trajectory of the guidepin. An appropriately sized reamer was used to ream the glenoid. The monoblock baseplate was inserted and excellent fixation was achieved such that the entire scapula rotated with further attempted seating of the baseplate. The peripheral screws were drilled, measured, and placed. A 32-4 glenosphere was then placed and tightened.   We then turned our attention back to the humerus. We sized for a small-shell prosthesis. We sequentially used larger diameter canal finders until we met significant resistance and sequential broaching was performed to this size listed above. The humerus was trialed and noted to have satisfactory stability, motion, and deltoid tension with a 0 poly. The subscapularis was of poor quality so it was not repaired. The final humeral component was then inserted. We placed an 0 poly. The humerus was reduced and final confirmation of motion, tension, and stability were satisfactory. A Hemovac drain was placed.   We closed the deltopectoral interval deep to the cephalic vein with a running, 0-Vicryl suture. The skin was closed with 2-0 Vicryl and staples. Xeroform and Honeycomb dressing was applied. A  PolarCare unit and sling were placed. Patient was extubated, transferred to a stretcher bed and to the post antesthesia care unit in stable condition.   Of note, services of a PA were essential to performing the surgery. PA was able to assist in patient positioning, exposure, retraction, and suturing the wound.    POSTOPERATIVE PLAN: The patient will be admitted with plan for discharge home on POD#1. Operative arm to remain in sling at all times except RoM exercises and hygiene. Can perform pendulums, elbow/wrist/hand RoM exercises. Passive RoM allowed to 90 FF and 30 ER. ASA 341m x 6 weeks for DVT ppx. Plan for outpatient PT starting on POD #3-4. Patient to return to clinic in ~2 weeks for post-operative appointment.

## 2017-11-20 NOTE — Anesthesia Procedure Notes (Signed)
Procedure Name: Intubation Date/Time: 11/20/2017 7:51 AM Performed by: Carron Curie, CRNA Pre-anesthesia Checklist: Patient identified, Patient being monitored, Timeout performed, Emergency Drugs available and Suction available Patient Re-evaluated:Patient Re-evaluated prior to induction Oxygen Delivery Method: Circle system utilized Preoxygenation: Pre-oxygenation with 100% oxygen Induction Type: IV induction Ventilation: Mask ventilation without difficulty Laryngoscope Size: Mac and 3 Grade View: Grade II Tube type: Oral Tube size: 7.0 mm Number of attempts: 1 Airway Equipment and Method: Stylet Placement Confirmation: ETT inserted through vocal cords under direct vision,  positive ETCO2 and breath sounds checked- equal and bilateral Secured at: 21 cm Tube secured with: Tape Dental Injury: Teeth and Oropharynx as per pre-operative assessment

## 2017-11-20 NOTE — Discharge Instructions (Signed)
Robin Haggard H. Mutasim Tuckey, MD  [facility]  Phone: [phone]  Fax: [phone]   Discharge Instructions after Reverse Shoulder Replacement    1. Activity/Sling: You are to be non-weight bearing on operative extremity. A sling/shoulder immobilizer has been provided for you. Only remove the sling to perform elbow, wrist, and hand RoM exercises and hygiene/dressing. Active reaching and lifting are not permitted. You will be given further instructions on sling use at your first physical therapy visit and postoperative visit with Dr. Jacarri Gesner.   2. Dressings: Dressing may be removed at 1st physical therapy visit (~3-4 days after surgery). Afterwards, you may either leave open to air (if no drainage) or cover with dry, sterile dressing. If you have steri-strips on your wound, please do not remove them. They will fall off on their own. You may shower 5 days after surgery. Please pat incision dry. Do not rub or place any shear forces across incision. If there is drainage or any opening of incision after 5 days, please notify our offices immediately.    3. Driving:  Plan on not driving for six weeks. Please note that you are advised NOT to drive while taking narcotic pain medications as you may be impaired and unsafe to drive.   4. Medications:  - You have been provided a prescription for narcotic pain medicine (usually oxycodone). After surgery, take 1-2 narcotic tablets every 4 hours if needed for severe pain. Please start this as soon as you begin to start having pain (if you received a nerve block, start taking as soon as this wears off).  - A prescription for anti-nausea medication will be provided in case the narcotic medicine causes nausea - take 1 tablet every 6 hours only if nauseated.  - Take enteric coated aspirin 325 mg once daily for 6 weeks to prevent blood clots. Do not take aspirin if you have an aspirin sensitivity/allergy or asthma or are on an anticoagulant (blood thinner) already. If so, then  your home anticoagulant will be resume and managed - do not take aspirin. -Take tylenol 1000mg (2 Extra strength or 3 regular strength tablets) every 8 hours for pain. This will reduce the amount of narcotic medication needed. May stop tylenol when you are having minimal pain. - Take a stool softener (Colace, Dulcolax or Senakot) if you are using narcotic pain medications to help with constipation that is associated with narcotic use. - DO NOT take ANY nonsteroidal anti-inflammatory pain medications: Advil, Motrin, Ibuprofen, Aleve, Naproxen, or Naprosyn.   If you are taking prescription medication for anxiety, depression, insomnia, muscle spasm, chronic pain, or for attention deficit disorder you are advised that you are at a higher risk of adverse effects with use of narcotics post-op, including narcotic addiction/dependence, depressed breathing, death. If you use non-prescribed substances: alcohol, marijuana, cocaine, heroin, methamphetamines, etc., you are at a higher risk of adverse effects with use of narcotics post-op, including narcotic addiction/dependence, depressed breathing, death. You are advised that taking > 50 morphine milligram equivalents (MME) of narcotic pain medication per day results in twice the risk of overdose or death. For your prescription provided: oxycodone 5 mg - taking more than 6 tablets per day after the first few days of surgery.   5. Physical Therapy: 1-2 times per week for ~12 weeks. Therapy typically starts on post operative Day 3 or 4. You have been provided an order for physical therapy. The therapist will provide home exercises. Please contact our offices if this appointment has not been scheduled.      6. Work: May do light duty/desk job in approximately 2 weeks when off of narcotics, pain is well-controlled, and swelling has decreased if able to function with one arm in sling. Full work may take 6 weeks if light motions and function of both arms is required.  Lifting jobs may require 12 weeks.   7. Post-Op Appointments: Your first post-op appointment will be with Dr. Stryder Poitra in approximately 2 weeks time.    If you find that they have not been scheduled please call the Orthopaedic Appointment front desk at [phone].                               Robin Mcmahen H. Danette Weinfeld, MD [facility] Phone: [phone] Fax: [phone]   REVERSE SHOULDER ARTHROPLASTY REHAB GUIDELINES   These guidelines should be tailored to individual patients based on their rehab goals, age, precautions, quality of repair, etc.  Progression should be based on patient progress and approval by the referring physician.  PHASE 1 - Day 1 through Week 2  GENERAL GUIDELINES AND PRECAUTIONS Sling wear 24/7 except during grooming and home exercises (3 to 5 times daily) Avoid shoulder extension such that the arm is posterior the frontal plane.  When patients recline, a pillow should be placed behind the upper arm and sling should be on.  They should be advised to always be able to see the elbow Avoid combined IR/ADD/EXT, such as hand behind back to prevent dislocation Avoid combined IR and ADD such as reaching across the chest to prevent dislocation No AROM No submersion in pool/water for 4 weeks No weight bearing through operative arm (as in transfers, walker use, etc.)  GOALS Maintain integrity of joint replacement; protect soft tissue healing Increase PROM for elevation to 120 and ER to 30 (will remain the goal for first 6 weeks) Optimize distal UE circulation and muscle activity (elbow, wrist and hand) Instruct in use of sling for proper fit, polar care device for ice application after HEP, signs/symptoms of infection  EXERCISES Active elbow, wrist and hand Passive forward elevation in scapular plane to 90-120 max motion; ER in scapular plane to 30 Active scapular retraction with arms resting in neutral position  CRITERIA TO PROGRESS TO  PHASE 2 Low pain (less than 3/10) with shoulder PROM Healing of incision without signs of infection Clearance by MD to advance after 2 week MD check up  PHASE 2 - 2 weeks - 6 weeks  GENERAL GUIDELINES AND PRECAUTIONS Sling may be removed while at home; worn in community without abduction pillow May use arm for light activities of daily living (such as feeding, brushing teeth, dressing.) with elbow near  the side of the body  and arm in front of the body- no active lifting of the arm May submerge in water (tub, pool, Jacuzzi, etc.) after 4 weeks Continue to avoid WBing through the operative arm Continue to avoid combined IR/EXT/ADD (hand behind the back) and IR/ADD  (reaching across chest) for dislocation precautions  GOALS  Achieve passive elevation to 120 and ER to 30  Low (less than 3/10) to no pain  Ability to fire all heads of the deltoid  EXERCISES May discontinue grip, and active elbow and wrist exercises since using the arm in ADL's  with sling removed around the home Continue passive elevation to 120 and ER to 30, both in scapular plane with arm supported on table top Add submaximal isometrics, pain free effort, for all   functional heads of deltoid (anterior, posterior, middle)  Ensure that with posterior deltoid isometric the shoulder does not move into extension and the arm remains anterior the frontal plane At 4 weeks:  begin to place arm in balanced position of 90 deg elevation in supine; when patient able to hold this position with ease, may begin reverse pendulums clockwise and counterclockwise  CRITERIA TO PROGRESS TO PHASE 3 Passive forward elevation in scapular plane to 120; passive ER in scapular plane to 30 Ability to fire isometrically all heads of the deltoid muscle without pain Ability to place and hold the arm in balanced position (90 deg elevation in supine)  PHASE 3 - 6 weeks to 3 months  GENERAL GUIDELINES AND PRECAUTIONS Discontinue use of sling Avoid  forcing end range motion in any direction to prevent dislocation  May advance use of the arm actively in ADL's without being restricted to arm by the side of the body, however, avoid heavy lifting and sports (forever!) May initiate functional IR behind the back gently NO UPPER BODY ERGOMETER   GOALS Optimize PROM for elevation and ER in scapular plane with realistic expectation that max  mobility for elevation is usually around 145-160 passively; ER 40 to 50 passively; functional IR to L1 Recover AROM to approach as close to PROM available as possible; may expect 135-150 deg active elevation; 30 deg active ER; active functional IR to L1 Establish dynamic stability of the shoulder with deltoid and periscapular muscle gradual strengthening  EXERCISES Forward elevation in scapular plane active progression: supine to incline, to vertical; short to long lever arm Balanced position long lever arm AROM Active ER/IR with arm at side Scapular retraction with light band resistance Functional IR with hand slide up back - very gentle and gradual NO UPPER BODY ERGOMETER     CRITERIA TO PROGRESS TO PHASE 4  AROM equals/approaches PROM with good mechanics for elevation   No pain  Higher level demand on shoulder than ADL functions   PHASE 4 12 months and beyond  GENERAL GUIDELINES AND PRECAUTIONS No heavy lifting and no overhead sports No heavy pushing activity Gradually increase strength of deltoid and scapular stabilizers; also the rotator cuff if present with weights not to exceed 5 lbs NO UPPER BODY ERGOMETER   GOALS  Optimize functional use of the operative UE to meet the desired demands  Gradual increase in deltoid, scapular muscle, and rotator cuff strength  Pain free functional activities   EXERCISES Add light hand weights for deltoid up to and not to exceed 3 lbs for anterior and posterior with long arm lift against gravity; elbow bent to 90 deg for abduction in scapular  plane Theraband progression for extension to hip with scapular depression/retraction Theraband progression for serratus anterior punches in supine; avoid wall, incline or prone pressups for serratus anterior End range stretching gently without forceful overpressure in all planes (elevation in scapular plane, ER in scapular plane, functional IR) with stretching done for life as part of a daily routine NO UPPER BODY ERGOMETER     CRITERIA FOR DISCHARGE FROM SKILLED PHYSICAL THERAPY  Pain free AROM for shoulder elevation (expect around 135-150)  Functional strength for all ADL's, work tasks, and hobbies approved by surgeon  Independence with home maintenance program   NOTES: 1. With proper exercise, motion, strength, and function continue to improve even after one year. 2. The complication rate after surgery is 5 - 8%. Complications include infection, fracture, heterotopic bone formation, nerve injury, instability, rotator cuff   tear, and tuberosity nonunion. Please look for clinical signs, unusual symptoms, or lack of progress with therapy and report those to Dr. Sharesa Kemp. Prefer more communication than less.  3. The therapy plan above only serves as a guide. Please be aware of specific individualized patient instructions as written on the prescription or through discussions with the surgeon. 4. Please call Dr. Tamaira Ciriello if you have any specific questions or concerns [phone]    

## 2017-11-20 NOTE — Anesthesia Preprocedure Evaluation (Addendum)
Anesthesia Evaluation  Patient identified by MRN, date of birth, ID band Patient awake    Reviewed: Allergy & Precautions, Patient's Chart, lab work & pertinent test results  History of Anesthesia Complications (+) PROLONGED EMERGENCE and history of anesthetic complications  Airway Mallampati: III       Dental   Pulmonary neg sleep apnea, neg COPD, former smoker,           Cardiovascular hypertension, Pt. on medications + Peripheral Vascular Disease  (-) Past MI and (-) CHF + Valvular Problems/Murmurs (moderate AS, no symptoms) AS      Neuro/Psych neg Seizures    GI/Hepatic Neg liver ROS, GERD  Medicated and Controlled,  Endo/Other  neg diabetes  Renal/GU negative Renal ROS     Musculoskeletal   Abdominal   Peds  Hematology   Anesthesia Other Findings   Reproductive/Obstetrics                            Anesthesia Physical Anesthesia Plan  ASA: III  Anesthesia Plan: General and Regional   Post-op Pain Management: GA combined w/ Regional for post-op pain   Induction:   PONV Risk Score and Plan: 3 and Dexamethasone, Ondansetron and Midazolam  Airway Management Planned: Oral ETT  Additional Equipment:   Intra-op Plan:   Post-operative Plan:   Informed Consent: I have reviewed the patients History and Physical, chart, labs and discussed the procedure including the risks, benefits and alternatives for the proposed anesthesia with the patient or authorized representative who has indicated his/her understanding and acceptance.     Plan Discussed with:   Anesthesia Plan Comments:        Anesthesia Quick Evaluation

## 2017-11-20 NOTE — Evaluation (Signed)
Physical Therapy Evaluation Patient Details Name: Robin Alexander MRN: [id number] DOB: 1933-02-15 Today's Date: 11/20/2017   History of Present Illness  Pt admitted for R TSR. Per RN, pt has been ambulatory to bathroom already this shift.   Clinical Impression  Pt is a pleasant 84 year old female who was admitted for R TSR. Pt performs bed mobility with min assist and became nauseated once seated at EOB, unable to further tolerate mobility. Per RN, pt ambulated to bathroom previously and was steady with pushing IV pole. Pt demonstrates deficits with R UE strength/mobility. Would benefit from skilled PT to address above deficits and promote optimal return to PLOF. Recommending OP PT pending progress.      Follow Up Recommendations Outpatient PT    Equipment Recommendations  None recommended by PT    Recommendations for Other Services       Precautions / Restrictions Precautions Precautions: Shoulder;Fall Shoulder Interventions: Shoulder abduction pillow Precaution Booklet Issued: No Restrictions Weight Bearing Restrictions: Yes RUE Weight Bearing: Non weight bearing      Mobility  Bed Mobility Overal bed mobility: Needs Assistance Bed Mobility: Supine to Sit     Supine to sit: Min assist     General bed mobility comments: safe technique with assistance for sliding B LEs off bed. Once seated at EOB, pt becomes very nauseated and unable to further participate. Request to lie back down in bed. RN called for meds.  Transfers                 General transfer comment: unable to tolerate  Ambulation/Gait                Stairs            Wheelchair Mobility    Modified Rankin (Stroke Patients Only)       Balance Overall balance assessment: Needs assistance;History of Falls Sitting-balance support: Feet supported;Single extremity supported Sitting balance-Leahy Scale: Fair                                       Pertinent  Vitals/Pain Pain Assessment: Faces Faces Pain Scale: Hurts a little bit Pain Location: R shoulder Pain Descriptors / Indicators: Operative site guarding Pain Intervention(s): Limited activity within patient's tolerance;Ice applied;Repositioned    Home Living Family/patient expects to be discharged to:: Private residence Living Arrangements: Spouse/significant other Available Help at Discharge: Family Type of Home: House Home Access: Stairs to enter Entrance Stairs-Rails: Can reach both Entrance Stairs-Number of Steps: 6 Home Layout: Able to live on main level with bedroom/bathroom Home Equipment: Cane - single point      Prior Function Level of Independence: Independent with assistive device(s)         Comments: uses SPC for community distances, furniture holds in home environment. Active and bikes on stationary bike daily     Hand Dominance        Extremity/Trunk Assessment   Upper Extremity Assessment Upper Extremity Assessment: (R UE not assessed due to shoulder immobilizer)    Lower Extremity Assessment Lower Extremity Assessment: Generalized weakness(B LE grossly 4/5)       Communication   Communication: No difficulties  Cognition Arousal/Alertness: Lethargic;Suspect due to medications(reports just received pain meds) Behavior During Therapy: [facility] for tasks assessed/performed Overall Cognitive Status: Within Functional Limits for tasks assessed  General Comments      Exercises Other Exercises Other Exercises: attempted ther-ex, however decreased sensation on R UE, unable to perform   Assessment/Plan    PT Assessment Patient needs continued PT services  PT Problem List Decreased strength;Decreased activity tolerance;Decreased balance;Decreased mobility;Pain       PT Treatment Interventions DME instruction;Gait training;Therapeutic exercise    PT Goals (Current goals can be found in the Care Plan  section)  Acute Rehab PT Goals Patient Stated Goal: to go home PT Goal Formulation: With patient Time For Goal Achievement: 12/04/17 Potential to Achieve Goals: Good    Frequency BID   Barriers to discharge        Co-evaluation               AM-PAC PT "6 Clicks" Daily Activity  Outcome Measure Difficulty turning over in bed (including adjusting bedclothes, sheets and blankets)?: Unable Difficulty moving from lying on back to sitting on the side of the bed? : Unable Difficulty sitting down on and standing up from a chair with arms (e.g., wheelchair, bedside commode, etc,.)?: Unable Help needed moving to and from a bed to chair (including a wheelchair)?: A Little Help needed walking in hospital room?: A Little Help needed climbing 3-5 steps with a railing? : A Lot 6 Click Score: 11    End of Session Equipment Utilized During Treatment: Oxygen Activity Tolerance: Other (comment)(limited by nausea) Patient left: in bed;with bed alarm set Nurse Communication: Mobility status PT Visit Diagnosis: Muscle weakness (generalized) (M62.81);Difficulty in walking, not elsewhere classified (R26.2);Pain Pain - Right/Left: Right Pain - part of body: Shoulder    Time: 2330-0762 PT Time Calculation (min) (ACUTE ONLY): 26 min   Charges:   PT Evaluation $PT Eval Low Complexity: 1 Low     PT G CodesGreggory Stallion, PT, DPT [phone]   Eveleigh Crumpler 11/20/2017, 5:31 PM

## 2017-11-20 NOTE — Discharge Summary (Addendum)
Physician Discharge Summary  Patient ID: RENNAE FERRAIOLO MRN: [id number] DOB/AGE: [birth year] 84 y.o.  Admit date: 11/20/2017 Discharge date: 11/21/17  Admission Diagnoses:  Status post shoulder replacement [Z96.619] Right shoulder rotator cuff arthropathy.  Discharge Diagnoses: Patient Active Problem List   Diagnosis Date Noted  . Status post shoulder replacement 11/20/2017  . Postmenopausal estrogen deficiency 10/15/2017  . Localized osteoarthritis of right shoulder 10/15/2017  . Difficulty sleeping 10/15/2017  . Acute bronchitis 12/29/2016  . Bilateral knee pain 06/23/2014  . Lumbar radiculopathy, chronic 06/23/2014  . Aortic stenosis, mild 11/03/2013  . Right bundle branch block 05/04/2013  . Hyperlipidemia 06/29/2012  . GERD (gastroesophageal reflux disease) 07/31/2011  . Hypertension 07/31/2011  Right shoulder rotator cuff arthropathy.  Past Medical History:  Diagnosis Date  . Abnormal CT scan, chest 03/20/2014  . Arthritis    shoulders  . Chicken pox   . Complication of anesthesia 2016   slow to wake up after anesthesia  . Dyspnea    uses inhaler when she gets bronchitis  . GERD (gastroesophageal reflux disease)   . Heart murmur   . High cholesterol   . HTN (hypertension)   . Pneumonia    spring 2015     Transfusion: None.   Consultants (if any):   Discharged Condition: Improved  Hospital Course: Robin Alexander is an 84 y.o. female who was admitted 11/20/2017 with a diagnosis of right rotator cuff arthropathy and went to the operating room on 11/20/2017 and underwent the above named procedures.    Surgeries: Procedure(s): REVERSE SHOULDER ARTHROPLASTY BICEPS TENODESIS on 11/20/2017 Patient tolerated the surgery well. Taken to PACU where she was stabilized and then transferred to the orthopedic floor.  Started on Aspirin EC 325mg . Foot pumps applied bilaterally at 80 mm. Heels elevated on bed with rolled towels. No evidence of DVT. Negative  Homan. Physical therapy started on day #1 for gait training and transfer. OT started day #1 for ADL and assisted devices.  Patient's IV and Foley were d/c on POD1.  Hemovac removed on POD1.  Implants: [employer] Surgical: RSP Glenoid Head w/Retaining screw 32-4; Monoblock Reverse Shoulder Baseplate with 4.0JW central screw; 3 locking screws into baseplate (11BJ, 47WG, 95AO); Small Shell Humeral Stem 8 x163mm; Neutral Small Socket Insert.  She was given perioperative antibiotics:  Anti-infectives (From admission, onward)   Start     Dose/Rate Route Frequency Ordered Stop   11/20/17 1400  ceFAZolin (ANCEF) IVPB 2g/100 mL premix     2 g 200 mL/hr over 30 Minutes Intravenous Every 6 hours 11/20/17 1241 11/21/17 0335   11/20/17 1245  ceFAZolin (ANCEF) IVPB 2g/100 mL premix  Status:  Discontinued     2 g 200 mL/hr over 30 Minutes Intravenous Every 6 hours 11/20/17 1231 11/20/17 1241   11/20/17 0841  vancomycin (VANCOCIN) powder  Status:  Discontinued       As needed 11/20/17 0841 11/20/17 1059   11/20/17 0606  ceFAZolin (ANCEF) 2-4 GM/100ML-% IVPB    Note to Pharmacy:  Jeanene Erb   : cabinet override      11/20/17 0606 11/20/17 1814   11/19/17 2300  ceFAZolin (ANCEF) IVPB 2g/100 mL premix     2 g 200 mL/hr over 30 Minutes Intravenous  Once 11/19/17 2259 11/20/17 0805    .  She was given sequential compression devices, early ambulation, and aspirin for DVT prophylaxis.  She benefited maximally from the hospital stay and there were no complications.    Recent vital signs:  Vitals:  11/20/17 2321 11/21/17 0738  BP: (!) 122/50 (!) 111/50  Pulse: 65 67  Resp: 16 16  Temp: 97.7 F (36.5 C) 97.7 F (36.5 C)  SpO2: 90% 95%    Recent laboratory studies:  Lab Results  Component Value Date   HGB 9.1 (L) 11/21/2017   HGB 12.8 11/17/2017   HGB 10.7 (L) 02/23/2015   Lab Results  Component Value Date   WBC 7.8 11/21/2017   PLT 170 11/21/2017   Lab Results  Component Value Date    INR 0.95 11/17/2017   Lab Results  Component Value Date   NA 135 11/21/2017   K 4.9 11/21/2017   CL 102 11/21/2017   CO2 25 11/21/2017   BUN 33 (H) 11/21/2017   CREATININE 1.30 (H) 11/21/2017   GLUCOSE 137 (H) 11/21/2017    Discharge Medications:   Allergies as of 11/21/2017      Reactions   Adhesive [tape]    Paper tape okay   Codeine Itching      Medication List    TAKE these medications   amLODipine-benazepril 5-20 MG capsule Commonly known as:  LOTREL Take 1 capsule by mouth  daily   aspirin 325 MG EC tablet Take 1 tablet (325 mg total) by mouth daily. What changed:    medication strength  how much to take  when to take this   docusate sodium 100 MG capsule Commonly known as:  COLACE Take 100 mg by mouth daily.   Fish Oil 1000 MG Caps Take 1,000-2,000 mg by mouth daily. TAKE 2 CAPSULES IN THE MORNING & 1 CAPSULE AT NIGHT   Magnesium 250 MG Tabs Take 250 mg by mouth daily.   methocarbamol 500 MG tablet Commonly known as:  ROBAXIN Take 1 tablet (500 mg total) by mouth every 6 (six) hours as needed for muscle spasms.   multivitamin with minerals Tabs tablet Take 1 tablet by mouth daily. CENTRUM SILVER   ondansetron 4 MG tablet Commonly known as:  ZOFRAN Take 1 tablet (4 mg total) by mouth every 6 (six) hours as needed for nausea.   oxyCODONE 5 MG immediate release tablet Commonly known as:  Oxy IR/ROXICODONE Take 1-2 tablets (5-10 mg total) by mouth every 4 (four) hours as needed for moderate pain.   ranitidine 150 MG tablet Commonly known as:  ZANTAC Take 150 mg by mouth 2 (two) times daily.   simvastatin 20 MG tablet Commonly known as:  ZOCOR TAKE 1 TABLET BY MOUTH  EVERY MORNING What changed:    how much to take  how to take this  when to take this   traMADol 50 MG tablet Commonly known as:  ULTRAM Take 1-2 tablets (50-100 mg total) by mouth every 6 (six) hours as needed for moderate pain.   Turmeric 500 MG Caps Take 500 mg by  mouth daily.   Vitamin B12 3000 MCG Subl Take 6,000 mg by mouth daily.       Diagnostic Studies: Ct Shoulder Right Wo Contrast  Result Date: 10/26/2017 CLINICAL DATA:  Right shoulder pain for approximately 3 years. The patient is scheduled for right shoulder surgery 11/23/2017. History of prior rotator cuff repair. No known injury. EXAM: CT OF THE UPPER RIGHT EXTREMITY WITHOUT CONTRAST TECHNIQUE: Multidetector CT imaging of the upper right extremity was performed according to the standard protocol. COMPARISON:  MRI right shoulder this same day. FINDINGS: Bones/Joint/Cartilage No acute abnormality is identified. The patient has advanced glenohumeral osteoarthritis. There is bone-on-bone joint space narrowing and  a prominent osteophyte off the humeral head. Minimal subchondral cyst formation in the glenoid is noted. Mild posterior subluxation of the humeral head on the glenoid is identified. The humeral head abuts the undersurface of the acromion with small cysts in both the acromion and humeral head at the site of abutment consistent with chronic change. There is some subacromial spurring. The patient has undergone debridement of the undersurface of the acromioclavicular joint. A prominent osteophyte off the inferior margin of the clavicular head is identified and osteophytosis off the superior aspect of the joint is seen. No lytic or sclerotic bony lesion is identified. No acute bony or joint abnormality. Ligaments Suboptimally assessed by CT. Muscles and Tendons Supraspinatus tear is better evaluated on the MRI which was performed today. Soft tissues Calcified granuloma in the right middle lobe is noted. Imaged lung parenchyma is otherwise clear. IMPRESSION: Advanced glenohumeral osteoarthritis. Status post debridement of the acromioclavicular joint. An osteophyte is seen off the inferior margin of the clavicular head and there is also osteophytosis off the superior margin of the joint. Findings  consistent with chronic abutment of the humeral head and acromion due to the patient's supraspinatus tendon tear seen on MRI of right shoulder today. Electronically Signed   By: Inge Rise M.D.   On: 10/26/2017 15:19   Mr Shoulder Right Wo Contrast  Result Date: 10/26/2017 CLINICAL DATA:  Chronic right shoulder pain and limited range of motion. History of rotator cuff repair 10 years ago. EXAM: MRI OF THE RIGHT SHOULDER WITHOUT CONTRAST TECHNIQUE: Multiplanar, multisequence MR imaging of the shoulder was performed. No intravenous contrast was administered. COMPARISON:  None. FINDINGS: Rotator cuff: There is rotator cuff tendinopathy. The supraspinatus is completely torn and retracted to the glenoid, 3.5-4.5 cm. The subscapularis and infraspinatus are intact. Muscles: There is some fatty atrophy of all the rotator cuff muscles. No focal atrophy or lesion. Biceps long head: Intact. Severe tendinopathy in the superior aspect of the bicipital groove head and articular segment is identified. Acromioclavicular Joint: The undersurface of the joint has been debrided. Bulky osteophytosis off the superior margin of the joint is identified. Type 2 acromion. Subacromial spurring is present. There is fluid in the subacromial/subdeltoid bursa. Glenohumeral Joint: Severe degenerative change is present. Cartilage is denuded throughout and there is subchondral edema on both sides of the joint. Osteophyte off the humeral head is present. Mild posterior subluxation of the humeral head is identified. The humeral head also abuts the undersurface of the acromion with cystic change in the acromion. Labrum:  Severely degenerated. Bones: No fracture or worrisome lesion. Degenerative cysts in the humeral head noted. Other: None. IMPRESSION: Complete supraspinatus tendon tear with retraction of 3.5-4.5 cm. All rotator cuff musculature demonstrates moderate fatty atrophy. Severe tendinopathy of both the intra and extra-articular long  head of biceps. Tendinopathy of the subscapularis and infraspinatus tendinopathy is also seen. Advanced glenohumeral osteoarthritis. Posterior subluxation of the humeral head on the glenoid is compatible with glenohumeral joint instability. The humeral head is high-riding and abuts the undersurface of the acromion with cystic change in the acromion consistent with chronic abutment. Status post debridement of the undersurface of the acromioclavicular joint. Subacromial spurring noted. Subacromial/subdeltoid fluid consistent with bursitis. Electronically Signed   By: Inge Rise M.D.   On: 10/26/2017 14:59   Dg Shoulder Right Port  Result Date: 11/20/2017 CLINICAL DATA:  Status post shoulder replacement. EXAM: PORTABLE RIGHT SHOULDER COMPARISON:  Shoulder CT 10/26/2017 FINDINGS: Post total right shoulder replacement with normal alignment of  the orthopedic hardware. Expected postsurgical changes are seen. Skin staples are noted. IMPRESSION: Status post total right shoulder replacement without evidence of immediate complications. Electronically Signed   By: Fidela Salisbury M.D.   On: 11/20/2017 12:09   Disposition: Plan will be for discharge home on 11/21/17 following drain removal and working with PT.  Follow-up Information    Leim Fabry, MD Follow up in 14 day(s).   Specialty:  Orthopedic Surgery Why:  Staple removal. Contact information: [place] [address] [phone]          Signed: Judson Roch PA-C 11/21/2017, 8:03 AM

## 2017-11-21 LAB — BASIC METABOLIC PANEL
Anion gap: 8 (ref 5–15)
BUN: 33 mg/dL — AB (ref 6–20)
CALCIUM: 8.9 mg/dL (ref 8.9–10.3)
CO2: 25 mmol/L (ref 22–32)
Chloride: 102 mmol/L (ref 101–111)
Creatinine, Ser: 1.3 mg/dL — ABNORMAL HIGH (ref 0.44–1.00)
GFR calc Af Amer: 43 mL/min — ABNORMAL LOW (ref >60)
GFR, EST NON AFRICAN AMERICAN: 37 mL/min — AB (ref >60)
Glucose, Bld: 137 mg/dL — ABNORMAL HIGH (ref 65–99)
Potassium: 4.9 mmol/L (ref 3.5–5.1)
SODIUM: 135 mmol/L (ref 135–145)

## 2017-11-21 LAB — CBC
HCT: 26 % — ABNORMAL LOW (ref 35.0–47.0)
Hemoglobin: 9.1 g/dL — ABNORMAL LOW (ref 12.0–16.0)
MCH: 32.4 pg (ref 26.0–34.0)
MCHC: 35 g/dL (ref 32.0–36.0)
MCV: 92.6 fL (ref 80.0–100.0)
PLATELETS: 170 10*3/uL (ref 150–440)
RBC: 2.8 MIL/uL — ABNORMAL LOW (ref 3.80–5.20)
RDW: 12.9 % (ref 11.5–14.5)
WBC: 7.8 10*3/uL (ref 3.6–11.0)

## 2017-11-21 LAB — ABO/RH: ABO/RH(D): A POS

## 2017-11-21 MED ORDER — TRAMADOL HCL 50 MG PO TABS: 50.0000 mg | ORAL_TABLET | Freq: Four times a day (QID) | ORAL | 0 refills | Status: DC | PRN

## 2017-11-21 NOTE — Care Management (Signed)
RNCM spoke with son [name] regarding discharge today and follow up as outpatient for PT/OT with Dr. Posey Pronto.  He is not aware of any appointments but will call on Monday to find out when that is needed. He has been advised that she will need to have staples removed in 14-days (is on discharge instructions). He states she will have assistance in the home with bathing and toileting.  No RNCM needs.

## 2017-11-21 NOTE — Progress Notes (Signed)
Physical Therapy Treatment Patient Details Name: Robin Alexander MRN: [id number] DOB: 07-18-1933 Today's Date: 11/21/2017    History of Present Illness Pt admitted for R TSR. Per RN, pt has been ambulatory to bathroom already this shift.     PT Comments    To edge of bed with min assist.  Pt was able to walk into bathroom with min guard/assist to void with SPC then continue to therapy gym.  She did voice LE fatigue during gait with standing rest breaks but no LOB or buckling.  After seated rest she was able to go up/down steps with left rail and vc's for sequencing.  Shoes were worn as she has a previous fracture L ankle and reports gait is improved with shoes and left on Left.  Stated she wears shoes at all times at home. Pt and family discussed home safety and reported no further questions.   Follow Up Recommendations  Outpatient PT     Equipment Recommendations  None recommended by PT    Recommendations for Other Services       Precautions / Restrictions Precautions Precautions: Shoulder;Fall Shoulder Interventions: Shoulder abduction pillow Restrictions Weight Bearing Restrictions: Yes RUE Weight Bearing: Non weight bearing    Mobility  Bed Mobility Overal bed mobility: Needs Assistance       Supine to sit: Min assist        Transfers Overall transfer level: Needs assistance Equipment used: Straight cane Transfers: Sit to/from Stand Sit to Stand: Min assist            Ambulation/Gait Ambulation/Gait assistance: Min assist Ambulation Distance (Feet): 120 Feet Assistive device: Straight cane Gait Pattern/deviations: Step-through pattern;Decreased step length - right;Decreased step length - left   Gait velocity interpretation: <1.8 ft/sec, indicative of risk for recurrent falls General Gait Details: genrally slow shuffling gait gait but no LOB   Stairs Stairs: Yes   Stair Management: One rail Left Number of Stairs: 4 General stair comments: vc's  for sequencing.  Wheelchair Mobility    Modified Rankin (Stroke Patients Only)       Balance Overall balance assessment: Needs assistance;History of Falls Sitting-balance support: Feet supported;Single extremity supported Sitting balance-Leahy Scale: Fair     Standing balance support: Single extremity supported Standing balance-Leahy Scale: Fair                              Cognition Arousal/Alertness: Awake/alert Behavior During Therapy: WFL for tasks assessed/performed Overall Cognitive Status: Within Functional Limits for tasks assessed                                        Exercises      General Comments        Pertinent Vitals/Pain Pain Assessment: Faces Faces Pain Scale: Hurts a little bit Pain Location: R shoulder Pain Descriptors / Indicators: Operative site guarding Pain Intervention(s): Limited activity within patient's tolerance    Home Living                      Prior Function            PT Goals (current goals can now be found in the care plan section) Progress towards PT goals: Progressing toward goals    Frequency    BID      PT Plan Current plan remains appropriate  Co-evaluation              AM-PAC PT "6 Clicks" Daily Activity  Outcome Measure  Difficulty turning over in bed (including adjusting bedclothes, sheets and blankets)?: Unable Difficulty moving from lying on back to sitting on the side of the bed? : Unable Difficulty sitting down on and standing up from a chair with arms (e.g., wheelchair, bedside commode, etc,.)?: A Little Help needed moving to and from a bed to chair (including a wheelchair)?: A Little Help needed walking in hospital room?: A Little Help needed climbing 3-5 steps with a railing? : A Little 6 Click Score: 14    End of Session   Activity Tolerance: Patient tolerated treatment well;Patient limited by fatigue Patient left: in chair;with chair alarm  set;with call bell/phone within reach;with family/visitor present Nurse Communication: Mobility status Pain - Right/Left: Right Pain - part of body: Shoulder     Time: 1030-1100 PT Time Calculation (min) (ACUTE ONLY): 30 min  Charges:  $Gait Training: 8-22 mins $Therapeutic Activity: 8-22 mins                    G Codes:       Chesley Noon, PTA 11/21/17, 12:46 PM

## 2017-11-21 NOTE — Progress Notes (Signed)
Pt c/o pain on the right shoulder, 6/10 scale. Pt was offered with Oxycodone as PRN medicine but pt refused. Pt stated Oxycodone "makes her stomach sick". Pt and husband at bedside asking for any alternative that could be given for pain. Dr. Roland Rack paged and ordered for Tramadol 50-100mg  PO every 6 hours PRN. Will administer and continue to monitor.

## 2017-11-21 NOTE — Progress Notes (Signed)
  Subjective: 1 Day Post-Op Procedure(s) (LRB): REVERSE SHOULDER ARTHROPLASTY (Right) BICEPS TENODESIS (Right) Patient reports pain as mild.   Patient is well, and has had no acute complaints or problems Plan is to go Home after hospital stay. Negative for chest pain and shortness of breath Fever: no Gastrointestinal:Negative for nausea and vomiting  Objective: Vital signs in last 24 hours: Temp:  [97.6 F (36.4 C)-98.2 F (36.8 C)] 97.7 F (36.5 C) (03/23 0738) Pulse Rate:  [64-76] 67 (03/23 0738) Resp:  [12-18] 16 (03/23 0738) BP: (94-128)/(33-64) 111/50 (03/23 0738) SpO2:  [90 %-100 %] 95 % (03/23 0738)  Intake/Output from previous day:  Intake/Output Summary (Last 24 hours) at 11/21/2017 0801 Last data filed at 11/21/2017 0500 Gross per 24 hour  Intake 3360 ml  Output 620 ml  Net 2740 ml    Intake/Output this shift: No intake/output data recorded.  Labs: Recent Labs    11/21/17 0413  HGB 9.1*   Recent Labs    11/21/17 0413  WBC 7.8  RBC 2.80*  HCT 26.0*  PLT 170   Recent Labs    11/21/17 0413  NA 135  K 4.9  CL 102  CO2 25  BUN 33*  CREATININE 1.30*  GLUCOSE 137*  CALCIUM 8.9   No results for input(s): LABPT, INR in the last 72 hours.   EXAM General - Patient is Alert, Appropriate and Oriented Extremity - ABD soft Incision: dressing C/D/I No cellulitis present  Pt is intact to light touch to the right arm, including over the distribution of the axillary nerve. Able to flex and extend wrist without discomfort. Dressing/Incision - clean, dry, no drainage, Hemovac removed this morning. Motor Function - intact, moving foot and toes well on exam.   Past Medical History:  Diagnosis Date  . Abnormal CT scan, chest 03/20/2014  . Arthritis    shoulders  . Chicken pox   . Complication of anesthesia 2016   slow to wake up after anesthesia  . Dyspnea    uses inhaler when she gets bronchitis  . GERD (gastroesophageal reflux disease)   . Heart  murmur   . High cholesterol   . HTN (hypertension)   . Pneumonia    spring 2015    Assessment/Plan: 1 Day Post-Op Procedure(s) (LRB): REVERSE SHOULDER ARTHROPLASTY (Right) BICEPS TENODESIS (Right) Active Problems:   Status post shoulder replacement  Estimated body mass index is 32.24 kg/m as calculated from the following:   Height as of this encounter: 4\' 9"  (1.448 m).   Weight as of this encounter: 67.6 kg (149 lb). Advance diet Up with therapy D/C IV fluids when tolerating po intake.  Labs reviewed this AM. Hemovac removed today. Up with therapy this morning. Plan will be for discharge home this afternoon. Pt is passing gas without pain.  DVT Prophylaxis - Aspirin, Foot Pumps and TED hose Non-weightbearing to the right arm.  Raquel Paiten Boies, PA-C [facility] 11/21/2017, 8:01 AM

## 2017-11-21 NOTE — Progress Notes (Signed)
DISCHARGE NOTE:  Pt given discharge instrcutions and prescriptions. Pt verbalized understanding. Pt wheeled to car by staff.

## 2017-11-22 ENCOUNTER — Other Ambulatory Visit: Payer: Self-pay

## 2017-11-22 ENCOUNTER — Emergency Department: Payer: Medicare Other

## 2017-11-22 ENCOUNTER — Encounter: Payer: Self-pay | Admitting: Emergency Medicine

## 2017-11-22 ENCOUNTER — Inpatient Hospital Stay
Admission: EM | Admit: 2017-11-22 | Discharge: 2017-11-25 | DRG: 948 | Disposition: A | Discharge status: Skilled Nursing Facility | Payer: Medicare Other | Attending: Internal Medicine | Admitting: Internal Medicine

## 2017-11-22 DIAGNOSIS — M48061 Spinal stenosis, lumbar region without neurogenic claudication: Secondary | ICD-10-CM | POA: Diagnosis present

## 2017-11-22 DIAGNOSIS — Z7982 Long term (current) use of aspirin: Secondary | ICD-10-CM | POA: Diagnosis not present

## 2017-11-22 DIAGNOSIS — R278 Other lack of coordination: Secondary | ICD-10-CM | POA: Diagnosis not present

## 2017-11-22 DIAGNOSIS — M47816 Spondylosis without myelopathy or radiculopathy, lumbar region: Secondary | ICD-10-CM | POA: Diagnosis present

## 2017-11-22 DIAGNOSIS — Z9841 Cataract extraction status, right eye: Secondary | ICD-10-CM

## 2017-11-22 DIAGNOSIS — R262 Difficulty in walking, not elsewhere classified: Secondary | ICD-10-CM | POA: Diagnosis present

## 2017-11-22 DIAGNOSIS — M19012 Primary osteoarthritis, left shoulder: Secondary | ICD-10-CM | POA: Diagnosis not present

## 2017-11-22 DIAGNOSIS — Z961 Presence of intraocular lens: Secondary | ICD-10-CM | POA: Diagnosis present

## 2017-11-22 DIAGNOSIS — E785 Hyperlipidemia, unspecified: Secondary | ICD-10-CM | POA: Diagnosis not present

## 2017-11-22 DIAGNOSIS — I2510 Atherosclerotic heart disease of native coronary artery without angina pectoris: Secondary | ICD-10-CM | POA: Diagnosis not present

## 2017-11-22 DIAGNOSIS — Z79899 Other long term (current) drug therapy: Secondary | ICD-10-CM

## 2017-11-22 DIAGNOSIS — M21371 Foot drop, right foot: Secondary | ICD-10-CM | POA: Diagnosis not present

## 2017-11-22 DIAGNOSIS — R531 Weakness: Secondary | ICD-10-CM | POA: Diagnosis not present

## 2017-11-22 DIAGNOSIS — Z888 Allergy status to other drugs, medicaments and biological substances status: Secondary | ICD-10-CM

## 2017-11-22 DIAGNOSIS — Z9842 Cataract extraction status, left eye: Secondary | ICD-10-CM | POA: Diagnosis not present

## 2017-11-22 DIAGNOSIS — Z7401 Bed confinement status: Secondary | ICD-10-CM | POA: Diagnosis not present

## 2017-11-22 DIAGNOSIS — I4510 Unspecified right bundle-branch block: Secondary | ICD-10-CM | POA: Diagnosis not present

## 2017-11-22 DIAGNOSIS — I639 Cerebral infarction, unspecified: Secondary | ICD-10-CM

## 2017-11-22 DIAGNOSIS — I6523 Occlusion and stenosis of bilateral carotid arteries: Secondary | ICD-10-CM | POA: Diagnosis not present

## 2017-11-22 DIAGNOSIS — K219 Gastro-esophageal reflux disease without esophagitis: Secondary | ICD-10-CM | POA: Diagnosis not present

## 2017-11-22 DIAGNOSIS — Z96643 Presence of artificial hip joint, bilateral: Secondary | ICD-10-CM | POA: Diagnosis present

## 2017-11-22 DIAGNOSIS — M21379 Foot drop, unspecified foot: Secondary | ICD-10-CM | POA: Diagnosis present

## 2017-11-22 DIAGNOSIS — Z96611 Presence of right artificial shoulder joint: Secondary | ICD-10-CM | POA: Diagnosis not present

## 2017-11-22 DIAGNOSIS — Z471 Aftercare following joint replacement surgery: Secondary | ICD-10-CM | POA: Diagnosis not present

## 2017-11-22 DIAGNOSIS — R2689 Other abnormalities of gait and mobility: Secondary | ICD-10-CM | POA: Diagnosis not present

## 2017-11-22 DIAGNOSIS — Z87891 Personal history of nicotine dependence: Secondary | ICD-10-CM | POA: Diagnosis not present

## 2017-11-22 DIAGNOSIS — M5126 Other intervertebral disc displacement, lumbar region: Secondary | ICD-10-CM | POA: Diagnosis not present

## 2017-11-22 DIAGNOSIS — Z885 Allergy status to narcotic agent status: Secondary | ICD-10-CM

## 2017-11-22 DIAGNOSIS — I10 Essential (primary) hypertension: Secondary | ICD-10-CM | POA: Diagnosis present

## 2017-11-22 DIAGNOSIS — Z981 Arthrodesis status: Secondary | ICD-10-CM

## 2017-11-22 DIAGNOSIS — J40 Bronchitis, not specified as acute or chronic: Secondary | ICD-10-CM

## 2017-11-22 DIAGNOSIS — M6281 Muscle weakness (generalized): Secondary | ICD-10-CM | POA: Diagnosis not present

## 2017-11-22 DIAGNOSIS — M1990 Unspecified osteoarthritis, unspecified site: Secondary | ICD-10-CM | POA: Diagnosis not present

## 2017-11-22 DIAGNOSIS — Z90710 Acquired absence of both cervix and uterus: Secondary | ICD-10-CM

## 2017-11-22 DIAGNOSIS — Z8673 Personal history of transient ischemic attack (TIA), and cerebral infarction without residual deficits: Secondary | ICD-10-CM | POA: Diagnosis not present

## 2017-11-22 DIAGNOSIS — M4800 Spinal stenosis, site unspecified: Secondary | ICD-10-CM | POA: Diagnosis not present

## 2017-11-22 DIAGNOSIS — G629 Polyneuropathy, unspecified: Secondary | ICD-10-CM | POA: Diagnosis not present

## 2017-11-22 DIAGNOSIS — R079 Chest pain, unspecified: Secondary | ICD-10-CM | POA: Diagnosis not present

## 2017-11-22 LAB — COMPREHENSIVE METABOLIC PANEL
ALBUMIN: 3.8 g/dL (ref 3.5–5.0)
ALT: 8 U/L — ABNORMAL LOW (ref 14–54)
ANION GAP: 10 (ref 5–15)
AST: 40 U/L (ref 15–41)
Alkaline Phosphatase: 57 U/L (ref 38–126)
BILIRUBIN TOTAL: 0.9 mg/dL (ref 0.3–1.2)
BUN: 38 mg/dL — AB (ref 6–20)
CO2: 25 mmol/L (ref 22–32)
Calcium: 9.2 mg/dL (ref 8.9–10.3)
Chloride: 99 mmol/L — ABNORMAL LOW (ref 101–111)
Creatinine, Ser: 1.32 mg/dL — ABNORMAL HIGH (ref 0.44–1.00)
GFR calc Af Amer: 42 mL/min — ABNORMAL LOW (ref >60)
GFR calc non Af Amer: 36 mL/min — ABNORMAL LOW (ref >60)
GLUCOSE: 119 mg/dL — AB (ref 65–99)
POTASSIUM: 4.2 mmol/L (ref 3.5–5.1)
SODIUM: 134 mmol/L — AB (ref 135–145)
TOTAL PROTEIN: 7 g/dL (ref 6.5–8.1)

## 2017-11-22 LAB — TYPE AND SCREEN
ABO/RH(D): A POS
Antibody Screen: NEGATIVE

## 2017-11-22 LAB — CBC WITH DIFFERENTIAL/PLATELET
Basophils Absolute: 0 10*3/uL (ref 0–0.1)
Basophils Relative: 1 %
Eosinophils Absolute: 0.1 10*3/uL (ref 0–0.7)
Eosinophils Relative: 2 %
HEMATOCRIT: 26.3 % — AB (ref 35.0–47.0)
HEMOGLOBIN: 8.9 g/dL — AB (ref 12.0–16.0)
LYMPHS PCT: 13 %
Lymphs Abs: 1.2 10*3/uL (ref 1.0–3.6)
MCH: 31 pg (ref 26.0–34.0)
MCHC: 33.9 g/dL (ref 32.0–36.0)
MCV: 91.4 fL (ref 80.0–100.0)
MONO ABS: 1.4 10*3/uL — AB (ref 0.2–0.9)
MONOS PCT: 16 %
NEUTROS ABS: 6.3 10*3/uL (ref 1.4–6.5)
NEUTROS PCT: 68 %
Platelets: 151 10*3/uL (ref 150–440)
RBC: 2.88 MIL/uL — ABNORMAL LOW (ref 3.80–5.20)
RDW: 13.6 % (ref 11.5–14.5)
WBC: 9 10*3/uL (ref 3.6–11.0)

## 2017-11-22 LAB — URINALYSIS, COMPLETE (UACMP) WITH MICROSCOPIC
BACTERIA UA: NONE SEEN
Bilirubin Urine: NEGATIVE
GLUCOSE, UA: NEGATIVE mg/dL
KETONES UR: NEGATIVE mg/dL
Leukocytes, UA: NEGATIVE
NITRITE: NEGATIVE
Protein, ur: NEGATIVE mg/dL
Specific Gravity, Urine: 1.015 (ref 1.005–1.030)
pH: 5 (ref 5.0–8.0)

## 2017-11-22 LAB — TROPONIN I: Troponin I: <0.03 ng/mL (ref <0.03)

## 2017-11-22 NOTE — ED Notes (Signed)
Report from [place], [place]

## 2017-11-22 NOTE — ED Provider Notes (Signed)
[facility] Emergency Department Provider Note   ____________________________________________   First MD Initiated Contact with Patient 11/22/17 1950     (approximate)  I have reviewed the triage vital signs and the nursing notes.   HISTORY  Chief Complaint Weakness; Eye Problem; and Post-op Problem    HPI Robin Alexander is a 84 y.o. female patient reports she had shoulder replacement surgery Friday. Saturday she had trouble walking, shuffling gait and continued today. She also reports some hip pain in the right hip.. Her O2 sats were 87-88 on room air via EMS and here in the emergency room.. Patient is not usually on oxygen at home. Patient reports only an occasional slight cough. She is not running a fever.she has no swelling her legs and has no pain in her chest.  Past Medical History:  Diagnosis Date  . Abnormal CT scan, chest 03/20/2014  . Arthritis    shoulders  . Chicken pox   . Complication of anesthesia 2016   slow to wake up after anesthesia  . Dyspnea    uses inhaler when she gets bronchitis  . GERD (gastroesophageal reflux disease)   . Heart murmur   . High cholesterol   . HTN (hypertension)   . Pneumonia    spring 2015    Patient Active Problem List   Diagnosis Date Noted  . Status post shoulder replacement 11/20/2017  . Postmenopausal estrogen deficiency 10/15/2017  . Localized osteoarthritis of right shoulder 10/15/2017  . Difficulty sleeping 10/15/2017  . Acute bronchitis 12/29/2016  . Bilateral knee pain 06/23/2014  . Lumbar radiculopathy, chronic 06/23/2014  . Aortic stenosis, mild 11/03/2013  . Right bundle branch block 05/04/2013  . Hyperlipidemia 06/29/2012  . GERD (gastroesophageal reflux disease) 07/31/2011  . Hypertension 07/31/2011    Past Surgical History:  Procedure Laterality Date  . ABDOMINAL HYSTERECTOMY     in 50's  . ANTERIOR LAT LUMBAR FUSION Right 02/28/2015   Procedure: ANTERIOR LATERAL LUMBAR  FUSION 1 LEVEL;  Surgeon: Phylliss Bob, MD;  Location: [facility];  Service: Orthopedics;  Laterality: Right;  Right sided lumbar 3-4 lateral interbody fusion  . APPENDECTOMY  1950  . BACK SURGERY     rod in back  . CATARACT EXTRACTION W/ INTRAOCULAR LENS  IMPLANT, BILATERAL Bilateral 2011  . CESAREAN SECTION     2  . JOINT REPLACEMENT    . ROTATOR CUFF REPAIR Right    right shoulder   10 YRS  . TOTAL HIP ARTHROPLASTY Bilateral 2005, 2006   Bilateral, [place]    Prior to Admission medications   Medication Sig Start Date End Date Taking? Authorizing Provider  amLODipine-benazepril (LOTREL) 5-20 MG capsule Take 1 capsule by mouth  daily 01/27/17   Leone Haven, MD  aspirin EC 325 MG EC tablet Take 1 tablet (325 mg total) by mouth daily. 11/21/17   Lattie Corns, PA-C  Cyanocobalamin (VITAMIN B12) 3000 MCG SUBL Take 6,000 mg by mouth daily.    [provider]  docusate sodium (COLACE) 100 MG capsule Take 100 mg by mouth daily.    [provider]  Magnesium 250 MG TABS Take 250 mg by mouth daily.    [provider]  methocarbamol (ROBAXIN) 500 MG tablet Take 1 tablet (500 mg total) by mouth every 6 (six) hours as needed for muscle spasms. 11/20/17   Lattie Corns, PA-C  Multiple Vitamin (MULTIVITAMIN WITH MINERALS) TABS tablet Take 1 tablet by mouth daily. CENTRUM SILVER  [provider]  Omega-3 Fatty Acids (FISH OIL) 1000 MG CAPS Take 1,000-2,000 mg by mouth daily. TAKE 2 CAPSULES IN THE MORNING & 1 CAPSULE AT NIGHT    [provider]  ondansetron (ZOFRAN) 4 MG tablet Take 1 tablet (4 mg total) by mouth every 6 (six) hours as needed for nausea. 11/20/17   Lattie Corns, PA-C  oxyCODONE (OXY IR/ROXICODONE) 5 MG immediate release tablet Take 1-2 tablets (5-10 mg total) by mouth every 4 (four) hours as needed for moderate pain. 11/20/17   Lattie Corns, PA-C  ranitidine (ZANTAC) 150 MG tablet Take 150 mg by mouth 2 (two)  times daily.    [provider]  simvastatin (ZOCOR) 20 MG tablet TAKE 1 TABLET BY MOUTH  EVERY MORNING Patient taking differently: TAKE 1 TABLET BY MOUTH  EVERY night 01/22/17   Leone Haven, MD  traMADol (ULTRAM) 50 MG tablet Take 1-2 tablets (50-100 mg total) by mouth every 6 (six) hours as needed for moderate pain. 11/21/17   Lattie Corns, PA-C  Turmeric 500 MG CAPS Take 500 mg by mouth daily.     [provider]    Allergies Adhesive [tape] and Codeine  Family History  Problem Relation Age of Onset  . Arthritis Mother   . Heart disease Mother   . Arthritis Father   . Stroke Brother   . Heart disease Brother   . Cancer Neg Hx     Social History Social History   Tobacco Use  . Smoking status: Former Smoker    Packs/day: 0.75    Years: 20.00    Pack years: 15.00    Types: Cigarettes    Last attempt to quit: 09/02/1983    Years since quitting: 34.2  . Smokeless tobacco: Never Used  Substance Use Topics  . Alcohol use: No    Alcohol/week: 0.0 oz    Comment: Occasional wine  . Drug use: No    Review of Systems  Constitutional: No fever/chills Eyes: No visual changes. ENT: No sore throat. Cardiovascular: Denies chest pain. Respiratory: Denies shortness of breath. Gastrointestinal: No abdominal pain.  No nausea, no vomiting.  No diarrhea.  No constipation. Genitourinary: Negative for dysuria. Musculoskeletal: Negative for back pain. Skin: Negative for rash. Neurological: Negative for headaches, focal weakness  ____________________________________________   PHYSICAL EXAM:  VITAL SIGNS: ED Triage Vitals [11/22/17 1949]  Enc Vitals Group     BP      Pulse      Resp      Temp      Temp src      SpO2 (!) 87 %     Weight      Height      Head Circumference      Peak Flow      Pain Score      Pain Loc      Pain Edu?      Excl. in [place]?     Constitutional: Alert and oriented. Well appearing and in no acute distress. Eyes:  Conjunctivae are normal. Head: Atraumatic. Nose: No congestion/rhinnorhea. Mouth/Throat: Mucous membranes are moist.  Oropharynx non-erythematous. Neck: No stridor.   Cardiovascular: Normal rate, regular rhythm. Grossly normal heart sounds.  Good peripheral circulation. Respiratory: Normal respiratory effort.  No retractions. Lungs CTAB. Gastrointestinal: Soft and nontender. No distention. No abdominal bruits. No CVA tenderness. Musculoskeletal: No lower extremity tenderness nor edema.   Neurologic:  Normal speech and language. No gross focal neurologic deficits are appreciated.  patient has difficulty walking not picking up her legs well. The right leg seems weaker than the left. Skin:  Skin is warm, dry and intact. No rash noted. Psychiatric: Mood and affect are normal. Speech and behavior are normal.  ____________________________________________   LABS (all labs ordered are listed, but only abnormal results are displayed)  Labs Reviewed  COMPREHENSIVE METABOLIC PANEL - Abnormal; Notable for the following components:      Result Value   Sodium 134 (*)    Chloride 99 (*)    Glucose, Bld 119 (*)    BUN 38 (*)    Creatinine, Ser 1.32 (*)    ALT 8 (*)    GFR calc non Af Amer 36 (*)    GFR calc Af Amer 42 (*)    All other components within normal limits  CBC WITH DIFFERENTIAL/PLATELET - Abnormal; Notable for the following components:   RBC 2.88 (*)    Hemoglobin 8.9 (*)    HCT 26.3 (*)    Monocytes Absolute 1.4 (*)    All other components within normal limits  URINALYSIS, COMPLETE (UACMP) WITH MICROSCOPIC - Abnormal; Notable for the following components:   Color, Urine YELLOW (*)    APPearance CLEAR (*)    Hgb urine dipstick SMALL (*)    Squamous Epithelial / LPF 0-5 (*)    All other components within normal limits  TROPONIN I   ____________________________________________  EKG  ____________________________________________  RADIOLOGY  ED MD interpretation: CT read by  radiology reviewed by me shows only old strokes  Official radiology report(s): Ct Head Wo Contrast  Result Date: 11/22/2017 CLINICAL DATA:  Weakness 2 days with visual changes. Shoulder replacement 2 days ago. Right-sided weakness with difficulty ambulating. EXAM: CT HEAD WITHOUT CONTRAST TECHNIQUE: Contiguous axial images were obtained from the base of the skull through the vertex without intravenous contrast. COMPARISON:  None. FINDINGS: Brain: Ventricles, cisterns and other CSF spaces are within normal. There is moderate chronic ischemic microvascular disease. Small old lacune infarct over the right lentiform nucleus. Old right cerebellar infarct. No mass, mass effect, shift of midline structures or acute hemorrhage. No acute infarction. Vascular: No hyperdense vessel or unexpected calcification. Skull: Normal. Negative for fracture or focal lesion. Sinuses/Orbits: No acute finding. Other: None. IMPRESSION: No acute findings. Moderate chronic ischemic microvascular disease. Old right basal ganglia lacunar infarct and old right cerebellar infarct. Electronically Signed   By: Marin Olp M.D.   On: 11/22/2017 21:20   Dg Chest Portable 1 View  Result Date: 11/22/2017 CLINICAL DATA:  84 year old female postoperative day 2 right shoulder replacement. Pain and weakness. EXAM: PORTABLE CHEST 1 VIEW COMPARISON:  11/20/2017 postoperative radiographs and earlier. FINDINGS: Supine AP chest at 2014 hours. Partially visible right total shoulder arthroplasty appears stable. Skin staples remain in place. Low lung volumes, but no pneumothorax, pulmonary edema, pleural effusion or confluent pulmonary opacity. Extensive Calcified aortic atherosclerosis. Other mediastinal contours are within normal limits. Visualized tracheal air column is within normal limits. Negative visible bowel gas pattern. IMPRESSION: 1.  No acute cardiopulmonary abnormality. 2. Partially visible right total shoulder arthroplasty hardware appears  stable. 3.  Aortic Atherosclerosis (ICD10-I70.0). Electronically Signed   By: Genevie Ann M.D.   On: 11/22/2017 21:17   Dg Hip Unilat W Or Wo Pelvis 2-3 Views Right  Result Date: 11/22/2017 CLINICAL DATA:  84 year old female with increased weakness x2 days. EXAM: DG HIP (WITH OR WITHOUT PELVIS) 2-3V RIGHT COMPARISON:  None. FINDINGS: There is no acute fracture or dislocation  bilateral hip total arthroplasty noted. The arthroplasty components appear intact and in anatomic alignment. No bone lucency noted to suggest loosening. Lower lumbar degenerative changes and fixation screws noted. The bones are osteopenic. The soft tissues appear unremarkable. IMPRESSION: No acute fracture or dislocation. Electronically Signed   By: Anner Crete M.D.   On: 11/22/2017 21:21    ____________________________________________   PROCEDURES  Procedure(s) performed:   Procedures  Critical Care performed:   ____________________________________________   INITIAL IMPRESSION / ASSESSMENT AND PLAN / ED COURSE  patient's eye complaint is her chronic cataracts. We will get an MRI that negative should be on to discharge her home. I will sign the patient to Dr. Owens Shark pending report of the MRI     Clinical Course as of Nov 23 2347  Sun Nov 22, 2017  2141 CT Head Wo Contrast [PM]    Clinical Course User Index [PM] Nena Polio, MD     ____________________________________________   FINAL CLINICAL IMPRESSION(S) / ED DIAGNOSES  Final diagnoses:  Weakness     ED Discharge Orders    None       Note:  This document was prepared using Dragon voice recognition software and may include unintentional dictation errors.    Nena Polio, MD 11/22/17 [phone]

## 2017-11-22 NOTE — ED Notes (Signed)
Patient transported to X-ray 

## 2017-11-22 NOTE — ED Triage Notes (Addendum)
Pt bib [facility] from home d/t increased weakness x2days and EMS reports of vision changes "looks smoky" but patient denies upon arrival.  pt had right shoulder replacement on Friday, d/c yesterday. Reports increased difficulty ambulating, right sided weakness. Pt has no hx lung disease, but room air sat 87%, remains at 94% on 2LNC.  Son reports patient began taking oxycodone yesterday, tramadol and amlodipine today. States pt began having visual hallucinations yesterday, although pt had no c/o when prompted.   EDP at bedside upon arrival to dept.

## 2017-11-22 NOTE — ED Notes (Signed)
Pt readjusted in bed for comfort. Family on phone answering screening questions for MRI tech.

## 2017-11-22 NOTE — ED Notes (Signed)
Purewik placed on pt by this RN and Alissa, EDT due to immobility

## 2017-11-23 ENCOUNTER — Emergency Department: Payer: Medicare Other

## 2017-11-23 ENCOUNTER — Inpatient Hospital Stay: Payer: Medicare Other

## 2017-11-23 ENCOUNTER — Encounter: Payer: Self-pay | Admitting: Orthopedic Surgery

## 2017-11-23 DIAGNOSIS — Z96643 Presence of artificial hip joint, bilateral: Secondary | ICD-10-CM | POA: Diagnosis present

## 2017-11-23 DIAGNOSIS — Z7982 Long term (current) use of aspirin: Secondary | ICD-10-CM | POA: Diagnosis not present

## 2017-11-23 DIAGNOSIS — R262 Difficulty in walking, not elsewhere classified: Secondary | ICD-10-CM | POA: Diagnosis present

## 2017-11-23 DIAGNOSIS — M19012 Primary osteoarthritis, left shoulder: Secondary | ICD-10-CM | POA: Diagnosis present

## 2017-11-23 DIAGNOSIS — M21379 Foot drop, unspecified foot: Secondary | ICD-10-CM | POA: Diagnosis present

## 2017-11-23 DIAGNOSIS — Z79899 Other long term (current) drug therapy: Secondary | ICD-10-CM | POA: Diagnosis not present

## 2017-11-23 DIAGNOSIS — I10 Essential (primary) hypertension: Secondary | ICD-10-CM | POA: Diagnosis present

## 2017-11-23 DIAGNOSIS — M21371 Foot drop, right foot: Secondary | ICD-10-CM | POA: Diagnosis present

## 2017-11-23 DIAGNOSIS — Z981 Arthrodesis status: Secondary | ICD-10-CM | POA: Diagnosis not present

## 2017-11-23 DIAGNOSIS — R531 Weakness: Principal | ICD-10-CM

## 2017-11-23 DIAGNOSIS — Z87891 Personal history of nicotine dependence: Secondary | ICD-10-CM | POA: Diagnosis not present

## 2017-11-23 DIAGNOSIS — R278 Other lack of coordination: Secondary | ICD-10-CM | POA: Diagnosis present

## 2017-11-23 DIAGNOSIS — I4510 Unspecified right bundle-branch block: Secondary | ICD-10-CM | POA: Diagnosis present

## 2017-11-23 DIAGNOSIS — M48061 Spinal stenosis, lumbar region without neurogenic claudication: Secondary | ICD-10-CM | POA: Diagnosis present

## 2017-11-23 DIAGNOSIS — Z9842 Cataract extraction status, left eye: Secondary | ICD-10-CM | POA: Diagnosis not present

## 2017-11-23 DIAGNOSIS — Z885 Allergy status to narcotic agent status: Secondary | ICD-10-CM | POA: Diagnosis not present

## 2017-11-23 DIAGNOSIS — Z888 Allergy status to other drugs, medicaments and biological substances status: Secondary | ICD-10-CM | POA: Diagnosis not present

## 2017-11-23 DIAGNOSIS — Z96611 Presence of right artificial shoulder joint: Secondary | ICD-10-CM | POA: Diagnosis present

## 2017-11-23 DIAGNOSIS — M47816 Spondylosis without myelopathy or radiculopathy, lumbar region: Secondary | ICD-10-CM | POA: Diagnosis present

## 2017-11-23 DIAGNOSIS — Z9841 Cataract extraction status, right eye: Secondary | ICD-10-CM | POA: Diagnosis not present

## 2017-11-23 DIAGNOSIS — Z8673 Personal history of transient ischemic attack (TIA), and cerebral infarction without residual deficits: Secondary | ICD-10-CM | POA: Diagnosis not present

## 2017-11-23 DIAGNOSIS — Z90710 Acquired absence of both cervix and uterus: Secondary | ICD-10-CM | POA: Diagnosis not present

## 2017-11-23 DIAGNOSIS — Z961 Presence of intraocular lens: Secondary | ICD-10-CM | POA: Diagnosis present

## 2017-11-23 LAB — LIPID PANEL
Cholesterol: 123 mg/dL (ref 0–200)
HDL: 36 mg/dL — ABNORMAL LOW (ref >40)
LDL Cholesterol: 69 mg/dL (ref 0–99)
Total CHOL/HDL Ratio: 3.4 RATIO
Triglycerides: 92 mg/dL (ref <150)
VLDL: 18 mg/dL (ref 0–40)

## 2017-11-23 LAB — HEMOGLOBIN A1C
Hgb A1c MFr Bld: 5.6 % (ref 4.8–5.6)
Mean Plasma Glucose: 114.02 mg/dL

## 2017-11-23 MED ORDER — FAMOTIDINE 20 MG PO TABS
20.0000 mg | ORAL_TABLET | Freq: Two times a day (BID) | ORAL | Status: DC
  Administered 2017-11-23 – 2017-11-25 (×5): 20 mg via ORAL
  Filled 2017-11-23 (×5): qty 1

## 2017-11-23 MED ORDER — MAGNESIUM OXIDE 400 (241.3 MG) MG PO TABS
200.0000 mg | ORAL_TABLET | Freq: Every day | ORAL | Status: DC
  Administered 2017-11-23 – 2017-11-25 (×3): 200 mg via ORAL
  Filled 2017-11-23 (×3): qty 1

## 2017-11-23 MED ORDER — ASPIRIN EC 325 MG PO TBEC
325.0000 mg | DELAYED_RELEASE_TABLET | Freq: Every day | ORAL | Status: DC
  Administered 2017-11-23 – 2017-11-25 (×3): 325 mg via ORAL
  Filled 2017-11-23 (×3): qty 1

## 2017-11-23 MED ORDER — SIMVASTATIN 20 MG PO TABS
20.0000 mg | ORAL_TABLET | Freq: Every day | ORAL | Status: DC
  Administered 2017-11-23 – 2017-11-24 (×2): 20 mg via ORAL
  Filled 2017-11-23 (×2): qty 1

## 2017-11-23 MED ORDER — MAGNESIUM 250 MG PO TABS: 250.0000 mg | ORAL_TABLET | Freq: Every day | ORAL | Status: DC

## 2017-11-23 MED ORDER — AMLODIPINE BESY-BENAZEPRIL HCL 5-20 MG PO CAPS: 1.0000 | ORAL_CAPSULE | Freq: Every day | ORAL | Status: DC

## 2017-11-23 MED ORDER — STROKE: EARLY STAGES OF RECOVERY BOOK
Freq: Once | Status: AC
  Administered 2017-11-23: 06:00:00

## 2017-11-23 MED ORDER — METHOCARBAMOL 500 MG PO TABS: 500.0000 mg | ORAL_TABLET | Freq: Four times a day (QID) | ORAL | Status: DC | PRN

## 2017-11-23 MED ORDER — TRAMADOL HCL 50 MG PO TABS
50.0000 mg | ORAL_TABLET | Freq: Four times a day (QID) | ORAL | Status: DC | PRN
  Administered 2017-11-23: 50 mg via ORAL
  Administered 2017-11-23 – 2017-11-24 (×2): 100 mg via ORAL
  Filled 2017-11-23 (×3): qty 2

## 2017-11-23 MED ORDER — ORAL CARE MOUTH RINSE
15.0000 mL | Freq: Two times a day (BID) | OROMUCOSAL | Status: DC
  Administered 2017-11-23 – 2017-11-25 (×2): 15 mL via OROMUCOSAL

## 2017-11-23 MED ORDER — ADULT MULTIVITAMIN W/MINERALS CH
1.0000 | ORAL_TABLET | Freq: Every day | ORAL | Status: DC
  Administered 2017-11-23 – 2017-11-25 (×3): 1 via ORAL
  Filled 2017-11-23 (×3): qty 1

## 2017-11-23 MED ORDER — OXYCODONE HCL 5 MG PO TABS
5.0000 mg | ORAL_TABLET | ORAL | Status: DC | PRN
Start: 2017-11-23 — End: 2017-11-24
  Administered 2017-11-23: 10:00:00 5 mg via ORAL
  Filled 2017-11-23: qty 1

## 2017-11-23 MED ORDER — POLYETHYLENE GLYCOL 3350 17 G PO PACK
17.0000 g | PACK | Freq: Every day | ORAL | Status: DC
  Administered 2017-11-23 – 2017-11-25 (×3): 17 g via ORAL
  Filled 2017-11-23 (×3): qty 1

## 2017-11-23 MED ORDER — OMEGA-3-ACID ETHYL ESTERS 1 G PO CAPS
1.0000 g | ORAL_CAPSULE | Freq: Two times a day (BID) | ORAL | Status: DC
  Administered 2017-11-23 – 2017-11-25 (×5): 1 g via ORAL
  Filled 2017-11-23 (×5): qty 1

## 2017-11-23 MED ORDER — VITAMIN B12 3000 MCG SL SUBL: 6000.0000 mg | SUBLINGUAL_TABLET | Freq: Every day | SUBLINGUAL | Status: DC

## 2017-11-23 MED ORDER — AMLODIPINE BESYLATE 5 MG PO TABS
5.0000 mg | ORAL_TABLET | Freq: Every day | ORAL | Status: DC
  Administered 2017-11-23 – 2017-11-25 (×2): 5 mg via ORAL
  Filled 2017-11-23 (×2): qty 1

## 2017-11-23 MED ORDER — TURMERIC 500 MG PO CAPS: 500.0000 mg | ORAL_CAPSULE | Freq: Every day | ORAL | Status: DC

## 2017-11-23 MED ORDER — ONDANSETRON HCL 4 MG PO TABS
4.0000 mg | ORAL_TABLET | Freq: Four times a day (QID) | ORAL | Status: DC | PRN
  Administered 2017-11-23: 4 mg via ORAL
  Filled 2017-11-23: qty 1

## 2017-11-23 MED ORDER — ALBUTEROL SULFATE (2.5 MG/3ML) 0.083% IN NEBU
2.5000 mg | INHALATION_SOLUTION | Freq: Once | RESPIRATORY_TRACT | Status: AC
  Administered 2017-11-23: 2.5 mg via RESPIRATORY_TRACT
  Filled 2017-11-23: qty 3

## 2017-11-23 MED ORDER — BENAZEPRIL HCL 20 MG PO TABS
20.0000 mg | ORAL_TABLET | Freq: Every day | ORAL | Status: DC
  Administered 2017-11-23: 10:00:00 20 mg via ORAL
  Filled 2017-11-23 (×3): qty 1

## 2017-11-23 MED ORDER — VITAMIN B-12 1000 MCG PO TABS
6000.0000 ug | ORAL_TABLET | Freq: Every day | ORAL | Status: DC
  Administered 2017-11-23 – 2017-11-25 (×3): 6000 ug via ORAL
  Filled 2017-11-23 (×3): qty 6

## 2017-11-23 NOTE — Progress Notes (Signed)
PHARMACIST - PHYSICIAN ORDER COMMUNICATION  CONCERNING: P&T Medication Policy on Herbal Medications  DESCRIPTION:  This patient's order for:  turmeric  has been noted.  This product(s) is classified as an "herbal" or natural product. Due to a lack of definitive safety studies or FDA approval, nonstandard manufacturing practices, plus the potential risk of unknown drug-drug interactions while on inpatient medications, the Pharmacy and Therapeutics Committee does not permit the use of "herbal" or natural products of this type within [facility].   ACTION TAKEN: The pharmacy department is unable to verify this order at this time. Please reevaluate patient's clinical condition at discharge and address if the herbal or natural product(s) should be resumed at that time.

## 2017-11-23 NOTE — Evaluation (Signed)
Physical Therapy Evaluation Patient Details Name: Robin Alexander MRN: [id number] DOB: April 08, 1933 Today's Date: 11/23/2017   History of Present Illness  Pt is an 84 y.o. female presenting to hospital 11/22/17 with weakness, difficulty ambulating, and dragging of R foot; also reports of hallucinating seeing puffs of smoke.  Of note, pt s/p R reverse shoulder arthroplasty with biceps tenodesis 11/20/17 and discharged home 11/21/17.  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  PMH includes B knee pain, htn, chronic lumbar radiculopathy, B THA, back sx, h/o L ankle fx (pt reports August 2018).  Clinical Impression  Prior to recent hospital admissions, pt was modified independent (held onto furniture as needed in home and used [facility] in R hand in community).  Pt lives with her husband on main level of home with 6 steps to enter with B railing.  Currently pt is max assist supine to sit; CGA to min assist (with vc's for technique) for transfers; and min assist ambulating 20 feet with SPC (limited d/t fatigue and weakness).  During ambulation, pt demonstrating decreased stance time on L LE making it challenging for pt to advance R LE (pt reports h/o L ankle fx in 2018 but no longer wears boot for it).  R hip flexion appearing weaker from L LE and increased tone noted in R ankle (good stability noted in R LE in standing though when advancing L LE).  Pt would benefit from skilled PT to address noted impairments and functional limitations (see below for any additional details).  Upon hospital discharge, recommend pt discharge to [place].    Follow Up Recommendations SNF    Equipment Recommendations  Cane    Recommendations for Other Services       Precautions / Restrictions Precautions Precautions: Shoulder;Fall Shoulder Interventions: Shoulder abduction pillow Restrictions Weight Bearing Restrictions: Yes RUE Weight Bearing: Non weight bearing      Mobility  Bed Mobility Overal bed mobility: Needs  Assistance Bed Mobility: Supine to Sit     Supine to sit: Max assist;HOB elevated     General bed mobility comments: assist for B LE's and trunk supine to sit; increased effort and time to perform, vc's for technique required  Transfers Overall transfer level: Needs assistance Equipment used: Straight cane Transfers: Sit to/from [employer] Transfers Sit to Stand: Min guard;Min assist(min assist from bed with vc's for positioning required; CGA from recliner) Stand pivot transfers: Min assist       General transfer comment: assist to steady stand step turn bed to recliner using SPC; vc's to scoot to edge of bed, position LE's, and nose over toes to stand (pt attempted multiple times to stand on own but unable from bed without this cueing)  Ambulation/Gait Ambulation/Gait assistance: Min assist;+2 safety/equipment(chair follow plus assist for O2 tank) Ambulation Distance (Feet): 20 Feet Assistive device: Straight cane   Gait velocity: decreased   General Gait Details: decreased stance time L LE; decreased step length R LE; narrow BOS; increased lateral sway to L; unsteady  Stairs            Wheelchair Mobility    Modified Rankin (Stroke Patients Only)       Balance Overall balance assessment: Needs assistance;History of Falls Sitting-balance support: Feet supported;Single extremity supported Sitting balance-Leahy Scale: Good Sitting balance - Comments: dynamic sitting   Standing balance support: Single extremity supported Standing balance-Leahy Scale: Poor Standing balance comment: requires UE support for dynamic standing/ambulation  Pertinent Vitals/Pain Pain Assessment: 0-10 Pain Score: 0-No pain Pain Location: R shoulder Pain Intervention(s): Limited activity within patient's tolerance;Monitored during session;Premedicated before session;Repositioned  Pt did report 5/10 R "thumb" pain beginning of session but  pain went away during session.    Home Living Family/patient expects to be discharged to:: Private residence Living Arrangements: Spouse/significant other Available Help at Discharge: Family Type of Home: House Home Access: Stairs to enter Entrance Stairs-Rails: Can reach both Entrance Stairs-Number of Steps: [place]: Able to live on main level with bedroom/bathroom Home Equipment: Cane - single point      Prior Function Level of Independence: Independent with assistive device(s)         Comments: Uses SPC for community distances, furniture holds in home environment. Active and bikes on stationary bike daily.     Hand Dominance        Extremity/Trunk Assessment   Upper Extremity Assessment Upper Extremity Assessment: Defer to OT evaluation(R UE not assessed d/t shoulder immobilizer and s/p recent surgery)    Lower Extremity Assessment Lower Extremity Assessment: RLE deficits/detail;LLE deficits/detail(Light touch intact B LE's) RLE Deficits / Details: hip flexion 2+/5, knee extension 4/5, knee flexion 4/5, DF 4/5; increased tone noted R ankle (tends to be more in plantarflexed position; pt reports h/o R ankle issues) LLE Deficits / Details: hip flexion 3-/5, knee extension 4/5, knee flexion 4/5, DF 4/5       Communication   Communication: No difficulties  Cognition Arousal/Alertness: Awake/alert Behavior During Therapy: WFL for tasks assessed/performed Overall Cognitive Status: Within Functional Limits for tasks assessed                                        General Comments General comments (skin integrity, edema, etc.): R UE abduction sling in place.  Nursing cleared pt for participation in physical therapy.  Pt agreeable to PT session.  Pt's husband present during session.    Exercises     Assessment/Plan    PT Assessment Patient needs continued PT services  PT Problem List Decreased strength;Decreased activity tolerance;Decreased  balance;Decreased mobility;Decreased knowledge of precautions;Pain       PT Treatment Interventions DME instruction;Gait training;Stair training;Functional mobility training;Therapeutic activities;Therapeutic exercise;Balance training;Patient/family education    PT Goals (Current goals can be found in the Care Plan section)  Acute Rehab PT Goals Patient Stated Goal: to go home PT Goal Formulation: With patient Time For Goal Achievement: 12/07/17 Potential to Achieve Goals: Fair    Frequency 7X/week   Barriers to discharge Decreased caregiver support      Co-evaluation               AM-PAC PT "6 Clicks" Daily Activity  Outcome Measure Difficulty turning over in bed (including adjusting bedclothes, sheets and blankets)?: Unable Difficulty moving from lying on back to sitting on the side of the bed? : Unable Difficulty sitting down on and standing up from a chair with arms (e.g., wheelchair, bedside commode, etc,.)?: Unable Help needed moving to and from a bed to chair (including a wheelchair)?: A Little Help needed walking in hospital room?: A Little Help needed climbing 3-5 steps with a railing? : A Lot 6 Click Score: 11    End of Session Equipment Utilized During Treatment: Gait belt;Oxygen;Other (comment)(R UE abduction sling) Activity Tolerance: Patient limited by fatigue Patient left: in chair;with call bell/phone within reach;with chair alarm set;with nursing/sitter in  room;with family/visitor present(B heels elevated via pillow; polar care on and activated) Nurse Communication: Mobility status;Precautions;Weight bearing status PT Visit Diagnosis: Unsteadiness on feet (R26.81);Other abnormalities of gait and mobility (R26.89);Muscle weakness (generalized) (M62.81);Difficulty in walking, not elsewhere classified (R26.2);Pain Pain - Right/Left: Right Pain - part of body: Shoulder    Time: 1024-1100 PT Time Calculation (min) (ACUTE ONLY): 36 min   Charges:   PT  Evaluation $PT Eval Low Complexity: 1 Low PT Treatments $Gait Training: 8-22 mins   PT G CodesLeitha Bleak, PT 11/23/17, 11:37 AM [phone]

## 2017-11-23 NOTE — ED Notes (Signed)
Pt returned from MRI. Pt denies needs currently.

## 2017-11-23 NOTE — H&P (Signed)
Robin Alexander is an 84 y.o. female.   Chief Complaint: Weakness HPI: The patient with past medical history of strokes and hypertension presents to the emergency department with weakness.  The patient has been having more trouble walking over the last few days than usual.  The patient reports that she has been dragging her right foot.  She also reported vision problems although her son states that she has been having hallucinations of seeing puffs of smoke.  Imaging of the brain in the emergency department revealed only old cerebellar infarcts however the patient could not ambulate which prompted the emergency department staff to call the hospitalist service for admission.  Past Medical History:  Diagnosis Date  . Abnormal CT scan, chest 03/20/2014  . Arthritis    shoulders  . Chicken pox   . Complication of anesthesia 2016   slow to wake up after anesthesia  . Dyspnea    uses inhaler when she gets bronchitis  . GERD (gastroesophageal reflux disease)   . Heart murmur   . High cholesterol   . HTN (hypertension)   . Pneumonia    spring 2015    Past Surgical History:  Procedure Laterality Date  . ABDOMINAL HYSTERECTOMY     in 50's  . ANTERIOR LAT LUMBAR FUSION Right 02/28/2015   Procedure: ANTERIOR LATERAL LUMBAR FUSION 1 LEVEL;  Surgeon: Phylliss Bob, MD;  Location: [facility];  Service: Orthopedics;  Laterality: Right;  Right sided lumbar 3-4 lateral interbody fusion  . APPENDECTOMY  1950  . BACK SURGERY     rod in back  . CATARACT EXTRACTION W/ INTRAOCULAR LENS  IMPLANT, BILATERAL Bilateral 2011  . CESAREAN SECTION     2  . JOINT REPLACEMENT    . ROTATOR CUFF REPAIR Right    right shoulder   10 YRS  . TOTAL HIP ARTHROPLASTY Bilateral 2005, 2006   Bilateral, [place]    Family History  Problem Relation Age of Onset  . Arthritis Mother   . Heart disease Mother   . Arthritis Father   . Stroke Brother   . Heart disease Brother   . Cancer Neg Hx    Social History:  reports  that she quit smoking about 34 years ago. Her smoking use included cigarettes. She has a 15.00 pack-year smoking history. She has never used smokeless tobacco. She reports that she does not drink alcohol or use drugs.  Allergies:  Allergies  Allergen Reactions  . Adhesive [Tape]     Paper tape okay  . Codeine Itching    Facility-Administered Medications Prior to Admission  Medication Dose Route Frequency Provider Last Rate Last Dose  . albuterol (PROVENTIL) (2.5 MG/3ML) 0.083% nebulizer solution 2.5 mg  2.5 mg Nebulization Once Burnard Hawthorne, FNP       Medications Prior to Admission  Medication Sig Dispense Refill  . amLODipine-benazepril (LOTREL) 5-20 MG capsule Take 1 capsule by mouth  daily 90 capsule 3  . aspirin EC 325 MG EC tablet Take 1 tablet (325 mg total) by mouth daily. 14 tablet 0  . Cyanocobalamin (VITAMIN B12) 3000 MCG SUBL Take 6,000 mg by mouth daily.    Marland Kitchen docusate sodium (COLACE) 100 MG capsule Take 100 mg by mouth daily.    . Magnesium 250 MG TABS Take 250 mg by mouth daily.    . methocarbamol (ROBAXIN) 500 MG tablet Take 1 tablet (500 mg total) by mouth every 6 (six) hours as needed for muscle spasms. 30 tablet 0  .  Multiple Vitamin (MULTIVITAMIN WITH MINERALS) TABS tablet Take 1 tablet by mouth daily. CENTRUM SILVER    . Omega-3 Fatty Acids (FISH OIL) 1000 MG CAPS Take 1,000-2,000 mg by mouth daily. TAKE 2 CAPSULES IN THE MORNING & 1 CAPSULE AT NIGHT    . ondansetron (ZOFRAN) 4 MG tablet Take 1 tablet (4 mg total) by mouth every 6 (six) hours as needed for nausea. 20 tablet 0  . oxyCODONE (OXY IR/ROXICODONE) 5 MG immediate release tablet Take 1-2 tablets (5-10 mg total) by mouth every 4 (four) hours as needed for moderate pain. 60 tablet 0  . ranitidine (ZANTAC) 150 MG tablet Take 150 mg by mouth 2 (two) times daily.    . simvastatin (ZOCOR) 20 MG tablet TAKE 1 TABLET BY MOUTH  EVERY MORNING (Patient taking differently: TAKE 1 TABLET BY MOUTH  EVERY night) 90  tablet 3  . traMADol (ULTRAM) 50 MG tablet Take 1-2 tablets (50-100 mg total) by mouth every 6 (six) hours as needed for moderate pain. 40 tablet 0  . Turmeric 500 MG CAPS Take 500 mg by mouth daily.       Results for orders placed or performed during the hospital encounter of 11/22/17 (from the past 48 hour(s))  Comprehensive metabolic panel     Status: Abnormal   Collection Time: 11/22/17  8:10 PM  Result Value Ref Range   Sodium 134 (L) 135 - 145 mmol/L   Potassium 4.2 3.5 - 5.1 mmol/L   Chloride 99 (L) 101 - 111 mmol/L   CO2 25 22 - 32 mmol/L   Glucose, Bld 119 (H) 65 - 99 mg/dL   BUN 38 (H) 6 - 20 mg/dL   Creatinine, Ser 1.32 (H) 0.44 - 1.00 mg/dL   Calcium 9.2 8.9 - 10.3 mg/dL   Total Protein 7.0 6.5 - 8.1 g/dL   Albumin 3.8 3.5 - 5.0 g/dL   AST 40 15 - 41 U/L   ALT 8 (L) 14 - 54 U/L   Alkaline Phosphatase 57 38 - 126 U/L   Total Bilirubin 0.9 0.3 - 1.2 mg/dL   GFR calc non Af Amer 36 (L) >60 mL/min   GFR calc Af Amer 42 (L) >60 mL/min    Comment: (NOTE) The eGFR has been calculated using the CKD EPI equation. This calculation has not been validated in all clinical situations. eGFR's persistently <60 mL/min signify possible Chronic Kidney Disease.    Anion gap 10 5 - 15    Comment: Performed at [facility], [address] [place], [place] [address]  Troponin I     Status: None   Collection Time: 11/22/17  8:10 PM  Result Value Ref Range   Troponin I <0.03 <0.03 ng/mL    Comment: Performed at [facility], [address] [place], [place] [address]  CBC with Differential     Status: Abnormal   Collection Time: 11/22/17  8:10 PM  Result Value Ref Range   WBC 9.0 3.6 - 11.0 K/uL   RBC 2.88 (L) 3.80 - 5.20 MIL/uL   Hemoglobin 8.9 (L) 12.0 - 16.0 g/dL   HCT 26.3 (L) 35.0 - 47.0 %   MCV 91.4 80.0 - 100.0 fL   MCH 31.0 26.0 - 34.0 pg   MCHC 33.9 32.0 - 36.0 g/dL   RDW 13.6 11.5 - 14.5 %   Platelets 151 150 - 440 K/uL   Neutrophils Relative % 68 %    Neutro Abs 6.3 1.4 - 6.5 K/uL  Lymphocytes Relative 13 %   Lymphs Abs 1.2 1.0 - 3.6 K/uL   Monocytes Relative 16 %   Monocytes Absolute 1.4 (H) 0.2 - 0.9 K/uL   Eosinophils Relative 2 %   Eosinophils Absolute 0.1 0 - 0.7 K/uL   Basophils Relative 1 %   Basophils Absolute 0.0 0 - 0.1 K/uL    Comment: Performed at [facility], [address] [place], [place] [address]  Urinalysis, Complete w Microscopic     Status: Abnormal   Collection Time: 11/22/17  8:10 PM  Result Value Ref Range   Color, Urine YELLOW (A) YELLOW   APPearance CLEAR (A) CLEAR   Specific Gravity, Urine 1.015 1.005 - 1.030   pH 5.0 5.0 - 8.0   Glucose, UA NEGATIVE NEGATIVE mg/dL   Hgb urine dipstick SMALL (A) NEGATIVE   Bilirubin Urine NEGATIVE NEGATIVE   Ketones, ur NEGATIVE NEGATIVE mg/dL   Protein, ur NEGATIVE NEGATIVE mg/dL   Nitrite NEGATIVE NEGATIVE   Leukocytes, UA NEGATIVE NEGATIVE   RBC / HPF 0-5 0 - 5 RBC/hpf   WBC, UA 0-5 0 - 5 WBC/hpf   Bacteria, UA NONE SEEN NONE SEEN   Squamous Epithelial / LPF 0-5 (A) NONE SEEN   Mucus PRESENT    Hyaline Casts, UA PRESENT     Comment: Performed at [facility], [address] [place], [place] [address]  Lipid panel     Status: Abnormal   Collection Time: 11/22/17  8:10 PM  Result Value Ref Range   Cholesterol 123 0 - 200 mg/dL   Triglycerides 92 <150 mg/dL   HDL 36 (L) >40 mg/dL   Total CHOL/HDL Ratio 3.4 RATIO   VLDL 18 0 - 40 mg/dL   LDL Cholesterol 69 0 - 99 mg/dL    Comment:        Total Cholesterol/HDL:CHD Risk Coronary Heart Disease Risk Table                     Men   Women  1/2 Average Risk   3.4   3.3  Average Risk       5.0   4.4  2 X Average Risk   9.6   7.1  3 X Average Risk  23.4   11.0        Use the calculated Patient Ratio above and the CHD Risk Table to determine the patient's CHD Risk.        ATP III CLASSIFICATION (LDL):  <100     mg/dL   Optimal  100-129  mg/dL   Near or Above                     Optimal  130-159  mg/dL   Borderline  160-189  mg/dL   High  >190     mg/dL   Very High Performed at [address] [place],  [address]    Ct Head Wo Contrast  Result Date: 11/22/2017 CLINICAL DATA:  Weakness 2 days with visual changes. Shoulder replacement 2 days ago. Right-sided weakness with difficulty ambulating. EXAM: CT HEAD WITHOUT CONTRAST TECHNIQUE: Contiguous axial images were obtained from the base of the skull through the vertex without intravenous contrast. COMPARISON:  None. FINDINGS: Brain: Ventricles, cisterns and other CSF spaces are within normal. There is moderate chronic ischemic microvascular disease. Small old lacune infarct over the right lentiform nucleus. Old right cerebellar infarct. No mass, mass effect, shift of midline structures  or acute hemorrhage. No acute infarction. Vascular: No hyperdense vessel or unexpected calcification. Skull: Normal. Negative for fracture or focal lesion. Sinuses/Orbits: No acute finding. Other: None. IMPRESSION: No acute findings. Moderate chronic ischemic microvascular disease. Old right basal ganglia lacunar infarct and old right cerebellar infarct. Electronically Signed   By: Marin Olp M.D.   On: 11/22/2017 21:20   Mr Brain Wo Contrast  Result Date: 11/23/2017 CLINICAL DATA:  Difficulty walking.  Recent shoulder surgery. EXAM: MRI HEAD WITHOUT CONTRAST TECHNIQUE: Multiplanar, multiecho pulse sequences of the brain and surrounding structures were obtained without intravenous contrast. COMPARISON:  Head CT 11/22/2017 FINDINGS: Brain: The midline structures are normal. There is no acute infarct or acute hemorrhage. No mass lesion, hydrocephalus, dural abnormality or extra-axial collection. Early confluent hyperintense T2-weighted signal of the periventricular and deep white matter, most commonly due to chronic ischemic microangiopathy. Old right basal ganglia and bilateral cerebellar infarcts. No age-advanced  or lobar predominant atrophy. No chronic microhemorrhage or superficial siderosis. Vascular: Major intracranial arterial and venous sinus flow voids are preserved. Skull and upper cervical spine: The visualized skull base, calvarium, upper cervical spine and extracranial soft tissues are normal. Sinuses/Orbits: No fluid levels or advanced mucosal thickening. No mastoid or middle ear effusion. Normal orbits. IMPRESSION: Severe chronic ischemic microangiopathy and multiple old infarcts without acute intracranial abnormality. Electronically Signed   By: Ulyses Jarred M.D.   On: 11/23/2017 01:44   Dg Chest Portable 1 View  Result Date: 11/22/2017 CLINICAL DATA:  84 year old female postoperative day 2 right shoulder replacement. Pain and weakness. EXAM: PORTABLE CHEST 1 VIEW COMPARISON:  11/20/2017 postoperative radiographs and earlier. FINDINGS: Supine AP chest at 2014 hours. Partially visible right total shoulder arthroplasty appears stable. Skin staples remain in place. Low lung volumes, but no pneumothorax, pulmonary edema, pleural effusion or confluent pulmonary opacity. Extensive Calcified aortic atherosclerosis. Other mediastinal contours are within normal limits. Visualized tracheal air column is within normal limits. Negative visible bowel gas pattern. IMPRESSION: 1.  No acute cardiopulmonary abnormality. 2. Partially visible right total shoulder arthroplasty hardware appears stable. 3.  Aortic Atherosclerosis (ICD10-I70.0). Electronically Signed   By: Genevie Ann M.D.   On: 11/22/2017 21:17   Dg Hip Unilat W Or Wo Pelvis 2-3 Views Right  Result Date: 11/22/2017 CLINICAL DATA:  84 year old female with increased weakness x2 days. EXAM: DG HIP (WITH OR WITHOUT PELVIS) 2-3V RIGHT COMPARISON:  None. FINDINGS: There is no acute fracture or dislocation bilateral hip total arthroplasty noted. The arthroplasty components appear intact and in anatomic alignment. No bone lucency noted to suggest loosening. Lower  lumbar degenerative changes and fixation screws noted. The bones are osteopenic. The soft tissues appear unremarkable. IMPRESSION: No acute fracture or dislocation. Electronically Signed   By: Anner Crete M.D.   On: 11/22/2017 21:21    Review of Systems  Constitutional: Negative for chills and fever.  HENT: Negative for sore throat and tinnitus.   Eyes: Negative for blurred vision and redness.  Respiratory: Negative for cough and shortness of breath.   Cardiovascular: Negative for chest pain, palpitations, orthopnea and PND.  Gastrointestinal: Negative for abdominal pain, diarrhea, nausea and vomiting.  Genitourinary: Negative for dysuria, frequency and urgency.  Musculoskeletal: Negative for joint pain and myalgias.  Skin: Negative for rash.       No lesions  Neurological: Positive for weakness. Negative for speech change and focal weakness.  Endo/Heme/Allergies: Does not bruise/bleed easily.       No temperature intolerance  Psychiatric/Behavioral: Positive for hallucinations (??).  Negative for depression and suicidal ideas.    Blood pressure (!) 142/47, pulse 74, temperature 98.2 F (36.8 C), resp. rate 16, height _0  (1.499 m), weight 72 kg (158 lb 11.7 oz), SpO2 96 %. Physical Exam  Vitals reviewed. Constitutional: She is oriented to person, place, and time. She appears well-developed and well-nourished. No distress.  HENT:  Head: Normocephalic and atraumatic.  Mouth/Throat: Oropharynx is clear and moist.  Eyes: Pupils are equal, round, and reactive to light. Conjunctivae and EOM are normal. No scleral icterus.  Neck: Normal range of motion. Neck supple. No JVD present. No tracheal deviation present. No thyromegaly present.  Cardiovascular: Normal rate, regular rhythm and normal heart sounds. Exam reveals no gallop and no friction rub.  No murmur heard. Respiratory: Effort normal and breath sounds normal.  GI: Soft. Bowel sounds are normal. She exhibits no distension.  There is no tenderness.  Genitourinary:  Genitourinary Comments: Deferred  Musculoskeletal: She exhibits no edema.  Right arm in sling s/p shoulder arthroplasty  Lymphadenopathy:    She has no cervical adenopathy.  Neurological: She is alert and oriented to person, place, and time. No cranial nerve deficit. She exhibits normal muscle tone.  Skin: Skin is warm and dry. No rash noted. No erythema.  Psychiatric: She has a normal mood and affect. Her behavior is normal. Judgment and thought content normal.     Assessment/Plan This is an 84 year old female admitted for dysmetria. 1.  Dysmetria: The patient has significant dysmetria of her right lower extremity which is making it difficult for her to walk.  Initially described as foot drop the patient has good muscular strength on flexion and dorsiflexion of the right foot.  MRI of the brain shows only old cerebellar infarcts.  The patient has recently undergone an echocardiogram.  Thus I will order ultrasounds of the carotid arteries at this time.  Neurology consult ordered as well. 2.  Difficulty walking: Will need PT/OT eval and treatment for next site of care needs 3.  Hypertension: Acceptable for age; continue benazepril the and amlodipine 4.  History of strokes: Continue aspirin.  Secondary prevention with statin therapy 5.  DVT prophylaxis: SCDs 6.  GI prophylaxis: None The patient is a full code.  Time spent on admission orders and patient care approximately 45 minutes  Harrie Foreman, MD 11/23/2017, 7:23 AM

## 2017-11-23 NOTE — Progress Notes (Signed)
OT Cancellation Note  Patient Details Name: Robin Alexander MRN: [id number] DOB: September 12, 1932   Cancelled Treatment:    Reason Eval/Treat Not Completed: Patient at procedure or test/ unavailable. Order received, chart reviewed. Pt out of room for diagnostic testing. Will re-attempt OT evaluation at later date/time as pt is available and medically appropriate.  Jeni Salles, MPH, MS, OTR/L ascom [phone] 11/23/17, 8:31 AM

## 2017-11-23 NOTE — Progress Notes (Signed)
Gave report to accepting RN on 1A. Patient transported via nurse. VS. Stable. No distress noted.  Dorna Bloom, LPN

## 2017-11-23 NOTE — ED Notes (Signed)
Patient transported to 40

## 2017-11-23 NOTE — ED Provider Notes (Signed)
I assumed care of the patient from Dr. Rip Harbour at 11:30 PM.  Spoke with the patient's family who informed me that the patient has had long-standing leg weakness with ambulation however that today the patient's weakness is profound and that patient was unable to ambulate.  MRI was ordered by Dr. Rip Harbour before he signed the patient out.  MRI revealed bilateral cerebellar infarcts as well as severe microvascular disease.  Spoke with the patient and family at length regarding admission in the right all clinical findings.  Patient discussed with Dr. Marcille Blanco for hospital admission further evaluation and management.   Gregor Hams, MD 11/23/17 [phone]

## 2017-11-23 NOTE — ED Notes (Signed)
Pt to MRI

## 2017-11-23 NOTE — ED Notes (Signed)
Admitting MD at bedside.

## 2017-11-23 NOTE — Progress Notes (Signed)
SLP Cancellation Note  Patient Details Name: Robin Alexander MRN: [id number] DOB: 1933/08/25   Cancelled treatment:       Reason Eval/Treat Not Completed: SLP screened, no needs identified, will sign off(chart reviewed; consulted NSG then met w/ pt/Son). Pt denied any difficulty swallowing and is currently on a regular diet; tolerates swallowing pills w/ water per NSG. Educated pt on using a PUREE like Applesauce for swallowing pills when in bed swallowing more now(d/t shoulder, illness) - better control during swallowing. All agreed. Pt conversed at conversational level w/out deficits noted; pt and family denied any new speech-language deficits.  No further skilled ST services indicated as pt appears at her baseline. Pt/Son agreed. NSG to reconsult if any change in status.    Orinda Kenner, MS, CCC-SLP Emmert Roethler 11/23/2017, 9:30 AM

## 2017-11-23 NOTE — Consult Note (Signed)
Reason for Consult:RLE weakness Referring Physician: Sudini  CC: RLE weakness  HPI: Robin Alexander is an 84 y.o. female with recent right shoulder surgery presenting to the ED with difficulty ambulating over the past few days.  Reportedly with RLE weakness and dragging right foot.  Patient unable to ambulate in the ED.  Patient reports that she usually uses a cane for ambulation.  Reports that she has had RLE weakness for the past 2 years.  Suspect this is related to her past lumbar fusion.    Past Medical History:  Diagnosis Date  . Abnormal CT scan, chest 03/20/2014  . Arthritis    shoulders  . Chicken pox   . Complication of anesthesia 2016   slow to wake up after anesthesia  . Dyspnea    uses inhaler when she gets bronchitis  . GERD (gastroesophageal reflux disease)   . Heart murmur   . High cholesterol   . HTN (hypertension)   . Pneumonia    spring 2015    Past Surgical History:  Procedure Laterality Date  . ABDOMINAL HYSTERECTOMY     in 50's  . ANTERIOR LAT LUMBAR FUSION Right 02/28/2015   Procedure: ANTERIOR LATERAL LUMBAR FUSION 1 LEVEL;  Surgeon: Phylliss Bob, MD;  Location: [facility];  Service: Orthopedics;  Laterality: Right;  Right sided lumbar 3-4 lateral interbody fusion  . APPENDECTOMY  1950  . BACK SURGERY     rod in back  . BICEPT TENODESIS Right 11/20/2017   Procedure: BICEPS TENODESIS;  Surgeon: Leim Fabry, MD;  Location: [facility];  Service: Orthopedics;  Laterality: Right;  . CATARACT EXTRACTION W/ INTRAOCULAR LENS  IMPLANT, BILATERAL Bilateral 2011  . CESAREAN SECTION     2  . JOINT REPLACEMENT    . REVERSE SHOULDER ARTHROPLASTY Right 11/20/2017   Procedure: REVERSE SHOULDER ARTHROPLASTY;  Surgeon: Leim Fabry, MD;  Location: [facility];  Service: Orthopedics;  Laterality: Right;  . ROTATOR CUFF REPAIR Right    right shoulder   10 YRS  . TOTAL HIP ARTHROPLASTY Bilateral 2005, 2006   Bilateral, [place]    Family History  Problem Relation Age of  Onset  . Arthritis Mother   . Heart disease Mother   . Arthritis Father   . Stroke Brother   . Heart disease Brother   . Cancer Neg Hx     Social History:  reports that she quit smoking about 34 years ago. Her smoking use included cigarettes. She has a 15.00 pack-year smoking history. She has never used smokeless tobacco. She reports that she does not drink alcohol or use drugs.  Allergies  Allergen Reactions  . Adhesive [Tape]     Paper tape okay  . Codeine Itching    Medications:  I have reviewed the patient's current medications. Prior to Admission:  Facility-Administered Medications Prior to Admission  Medication Dose Route Frequency Provider Last Rate Last Dose  . albuterol (PROVENTIL) (2.5 MG/3ML) 0.083% nebulizer solution 2.5 mg  2.5 mg Nebulization Once Burnard Hawthorne, FNP       Medications Prior to Admission  Medication Sig Dispense Refill Last Dose  . amLODipine-benazepril (LOTREL) 5-20 MG capsule Take 1 capsule by mouth  daily 90 capsule 3 11/22/2017 at Unknown time  . aspirin EC 325 MG EC tablet Take 1 tablet (325 mg total) by mouth daily. 14 tablet 0 11/22/2017 at Unknown time  . Cyanocobalamin (VITAMIN B12) 3000 MCG SUBL Take 6,000 mg by mouth daily.   11/22/2017 at Unknown time  .  docusate sodium (COLACE) 100 MG capsule Take 100 mg by mouth daily.   11/22/2017 at Unknown time  . Magnesium 250 MG TABS Take 250 mg by mouth daily.   11/22/2017 at Unknown time  . methocarbamol (ROBAXIN) 500 MG tablet Take 1 tablet (500 mg total) by mouth every 6 (six) hours as needed for muscle spasms. 30 tablet 0 prn at prn  . Multiple Vitamin (MULTIVITAMIN WITH MINERALS) TABS tablet Take 1 tablet by mouth daily. CENTRUM SILVER   11/22/2017 at Unknown time  . Omega-3 Fatty Acids (FISH OIL) 1000 MG CAPS Take 1,000-2,000 mg by mouth daily. TAKE 2 CAPSULES IN THE MORNING & 1 CAPSULE AT NIGHT   11/22/2017 at am  . ondansetron (ZOFRAN) 4 MG tablet Take 1 tablet (4 mg total) by mouth every 6  (six) hours as needed for nausea. 20 tablet 0 11/22/2017 at 0830  . oxyCODONE (OXY IR/ROXICODONE) 5 MG immediate release tablet Take 1-2 tablets (5-10 mg total) by mouth every 4 (four) hours as needed for moderate pain. 60 tablet 0 11/22/2017 at 0830  . ranitidine (ZANTAC) 150 MG tablet Take 150 mg by mouth 2 (two) times daily.   11/22/2017 at Unknown time  . simvastatin (ZOCOR) 20 MG tablet TAKE 1 TABLET BY MOUTH  EVERY MORNING (Patient taking differently: TAKE 1 TABLET BY MOUTH  EVERY night) 90 tablet 3 Past Week at Unknown time  . traMADol (ULTRAM) 50 MG tablet Take 1-2 tablets (50-100 mg total) by mouth every 6 (six) hours as needed for moderate pain. 40 tablet 0 11/22/2017 at 1230  . Turmeric 500 MG CAPS Take 500 mg by mouth daily.    11/22/2017 at Unknown time   Scheduled: . amLODipine  5 mg Oral Daily   And  . benazepril  20 mg Oral Daily  . aspirin  325 mg Oral Daily  . famotidine  20 mg Oral BID  . magnesium oxide  200 mg Oral Daily  . mouth rinse  15 mL Mouth Rinse BID  . multivitamin with minerals  1 tablet Oral Daily  . omega-3 acid ethyl esters  1 g Oral BID  . simvastatin  20 mg Oral QHS  . vitamin B-12  6,000 mcg Oral Daily    ROS: History obtained from the patient  General ROS: negative for - chills, fatigue, fever, night sweats, weight gain or weight loss Psychological ROS: negative for - behavioral disorder, hallucinations, memory difficulties, mood swings or suicidal ideation Ophthalmic ROS: negative for - blurry vision, double vision, eye pain or loss of vision ENT ROS: negative for - epistaxis, nasal discharge, oral lesions, sore throat, tinnitus or vertigo Allergy and Immunology ROS: negative for - hives or itchy/watery eyes Hematological and Lymphatic ROS: negative for - bleeding problems, bruising or swollen lymph nodes Endocrine ROS: negative for - galactorrhea, hair pattern changes, polydipsia/polyuria or temperature intolerance Respiratory ROS: shortness of  breath Cardiovascular ROS: negative for - chest pain, dyspnea on exertion, edema or irregular heartbeat Gastrointestinal ROS: negative for - abdominal pain, diarrhea, hematemesis, nausea/vomiting or stool incontinence Genito-Urinary ROS: negative for - dysuria, hematuria, incontinence or urinary frequency/urgency Musculoskeletal ROS: right thumb pain, back pain Neurological ROS: as noted in HPI Dermatological ROS: negative for rash and skin lesion changes  Physical Examination: Blood pressure (!) 130/50, pulse 82, temperature 98.2 F (36.8 C), resp. rate 18, height 4\' 11"  (1.499 m), weight 72 kg (158 lb 11.7 oz), SpO2 97 %.  HEENT-  Normocephalic, no lesions, without obvious abnormality.  Normal  external eye and conjunctiva.  Normal TM's bilaterally.  Normal auditory canals and external ears. Normal external nose, mucus membranes and septum.  Normal pharynx. Cardiovascular- S1, S2 normal, pulses palpable throughout   Lungs- chest clear, no wheezing, rales, normal symmetric air entry Abdomen- soft, non-tender; bowel sounds normal; no masses,  no organomegaly Extremities- no edema Lymph-no adenopathy palpable Musculoskeletal-no joint tenderness, deformity or swelling Skin-warm and dry, no hyperpigmentation, vitiligo, or suspicious lesions  Neurological Examination   Mental Status: Alert and awake.  Oriented to person, place and time.  Speech fluent without evidence of aphasia.  Able to follow 3 step commands without difficulty. Cranial Nerves: II: Discs flat bilaterally; Visual fields grossly normal, pupils equal, round, reactive to light and accommodation III,IV, VI: ptosis not present, extra-ocular motions intact bilaterally V,VII: decrease in right NLF, facial light touch sensation normal bilaterally VIII: hearing normal bilaterally IX,X: gag reflex present XI: bilateral shoulder shrug XII: midline tongue extension Motor: Right : Upper extremity   5/5    Left:     Upper extremity    5/5 Patient with hip flexor weakness bilaterally and unable to lift either leg more than a few inches off the recliner (L>R). Patient 5/5 otherwise in the lower extremities. Sensory: Pinprick and light touch intact throughout, bilaterally Deep Tendon Reflexes: 2+ in the upper extremities, trace at the knees and absent at the ankles. Plantars: Right: mute   Left: mute Cerebellar: Normal finger-to-nose testing on the left.  Heel to shin testing not performed due to weakness Gait: not tested due to safety concerns    Laboratory Studies:   Basic Metabolic Panel: Recent Labs  Lab 11/17/17 1616 11/21/17 0413 11/22/17 2010  NA 140 135 134*  K 4.1 4.9 4.2  CL 102 102 99*  CO2 27 25 25   GLUCOSE 110* 137* 119*  BUN 25* 33* 38*  CREATININE 1.06* 1.30* 1.32*  CALCIUM 10.0 8.9 9.2    Liver Function Tests: Recent Labs  Lab 11/22/17 2010  AST 40  ALT 8*  ALKPHOS 57  BILITOT 0.9  PROT 7.0  ALBUMIN 3.8   No results for input(s): LIPASE, AMYLASE in the last 168 hours. No results for input(s): AMMONIA in the last 168 hours.  CBC: Recent Labs  Lab 11/17/17 1616 11/21/17 0413 11/22/17 2010  WBC 7.9 7.8 9.0  NEUTROABS 3.8  --  6.3  HGB 12.8 9.1* 8.9*  HCT 37.7 26.0* 26.3*  MCV 92.3 92.6 91.4  PLT 204 170 151    Cardiac Enzymes: Recent Labs  Lab 11/22/17 2010  TROPONINI <0.03    BNP: Invalid input(s): POCBNP  CBG: No results for input(s): GLUCAP in the last 168 hours.  Microbiology: Results for orders placed or performed during the hospital encounter of 11/17/17  Urine culture     Status: Abnormal   Collection Time: 11/17/17  4:16 PM  Result Value Ref Range Status   Specimen Description   Final    URINE, CLEAN CATCH Performed at [facility], [address] [place], [place] [address]    Special Requests   Final    NONE Performed at [facility], [address] [place], [place] [address]    Culture >=100,000 COLONIES/mL ESCHERICHIA COLI  (A)  Final   Report Status 11/19/2017 FINAL  Final   Organism ID, Bacteria ESCHERICHIA COLI (A)  Final      Susceptibility   Escherichia coli - MIC*    AMPICILLIN <=2 SENSITIVE Sensitive     CEFAZOLIN <=4  SENSITIVE Sensitive     CEFTRIAXONE <=1 SENSITIVE Sensitive     CIPROFLOXACIN <=0.25 SENSITIVE Sensitive     GENTAMICIN <=1 SENSITIVE Sensitive     IMIPENEM <=0.25 SENSITIVE Sensitive     NITROFURANTOIN <=16 SENSITIVE Sensitive     TRIMETH/SULFA <=20 SENSITIVE Sensitive     AMPICILLIN/SULBACTAM <=2 SENSITIVE Sensitive     PIP/TAZO <=4 SENSITIVE Sensitive     Extended ESBL NEGATIVE Sensitive     * >=100,000 COLONIES/mL ESCHERICHIA COLI  Surgical pcr screen     Status: None   Collection Time: 11/17/17  4:16 PM  Result Value Ref Range Status   MRSA, PCR NEGATIVE NEGATIVE Final   Staphylococcus aureus NEGATIVE NEGATIVE Final    Comment: (NOTE) The Xpert SA Assay (FDA approved for NASAL specimens in patients 65 years of age and older), is one component of a comprehensive surveillance program. It is not intended to diagnose infection nor to guide or monitor treatment. Performed at [facility], [address] [place], [place] [address]     Coagulation Studies: No results for input(s): LABPROT, INR in the last 72 hours.  Urinalysis:  Recent Labs  Lab 11/17/17 1616 11/22/17 2010  COLORURINE YELLOW* YELLOW*  LABSPEC 1.013 1.015  PHURINE 7.0 5.0  GLUCOSEU NEGATIVE NEGATIVE  HGBUR NEGATIVE SMALL*  BILIRUBINUR NEGATIVE NEGATIVE  KETONESUR NEGATIVE NEGATIVE  PROTEINUR NEGATIVE NEGATIVE  NITRITE POSITIVE* NEGATIVE  LEUKOCYTESUR NEGATIVE NEGATIVE    Lipid Panel:     Component Value Date/Time   CHOL 123 11/22/2017 2010   TRIG 92 11/22/2017 2010   HDL 36 (L) 11/22/2017 2010   CHOLHDL 3.4 11/22/2017 2010   VLDL 18 11/22/2017 2010   LDLCALC 69 11/22/2017 2010    HgbA1C:  Lab Results  Component Value Date   HGBA1C 5.6 11/22/2017    Urine Drug Screen:  No  results found for: LABOPIA, COCAINSCRNUR, LABBENZ, AMPHETMU, THCU, LABBARB  Alcohol Level: No results for input(s): ETH in the last 168 hours.  Other results: EKG: sinus rhythm at 93 bpm.  Imaging: Ct Head Wo Contrast  Result Date: 11/22/2017 CLINICAL DATA:  Weakness 2 days with visual changes. Shoulder replacement 2 days ago. Right-sided weakness with difficulty ambulating. EXAM: CT HEAD WITHOUT CONTRAST TECHNIQUE: Contiguous axial images were obtained from the base of the skull through the vertex without intravenous contrast. COMPARISON:  None. FINDINGS: Brain: Ventricles, cisterns and other CSF spaces are within normal. There is moderate chronic ischemic microvascular disease. Small old lacune infarct over the right lentiform nucleus. Old right cerebellar infarct. No mass, mass effect, shift of midline structures or acute hemorrhage. No acute infarction. Vascular: No hyperdense vessel or unexpected calcification. Skull: Normal. Negative for fracture or focal lesion. Sinuses/Orbits: No acute finding. Other: None. IMPRESSION: No acute findings. Moderate chronic ischemic microvascular disease. Old right basal ganglia lacunar infarct and old right cerebellar infarct. Electronically Signed   By: Marin Olp M.D.   On: 11/22/2017 21:20   Mr Brain Wo Contrast  Result Date: 11/23/2017 CLINICAL DATA:  Difficulty walking.  Recent shoulder surgery. EXAM: MRI HEAD WITHOUT CONTRAST TECHNIQUE: Multiplanar, multiecho pulse sequences of the brain and surrounding structures were obtained without intravenous contrast. COMPARISON:  Head CT 11/22/2017 FINDINGS: Brain: The midline structures are normal. There is no acute infarct or acute hemorrhage. No mass lesion, hydrocephalus, dural abnormality or extra-axial collection. Early confluent hyperintense T2-weighted signal of the periventricular and deep white matter, most commonly due to chronic ischemic microangiopathy. Old right basal ganglia and bilateral cerebellar  infarcts. No age-advanced or lobar predominant atrophy. No chronic microhemorrhage or superficial siderosis. Vascular: Major intracranial arterial and venous sinus flow voids are preserved. Skull and upper cervical spine: The visualized skull base, calvarium, upper cervical spine and extracranial soft tissues are normal. Sinuses/Orbits: No fluid levels or advanced mucosal thickening. No mastoid or middle ear effusion. Normal orbits. IMPRESSION: Severe chronic ischemic microangiopathy and multiple old infarcts without acute intracranial abnormality. Electronically Signed   By: Ulyses Jarred M.D.   On: 11/23/2017 01:44   US Carotid Bilateral (at [facility] Only)  Result Date: 11/23/2017 CLINICAL DATA:  84 year old female with a history of stroke. Cardiovascular risk factors include hypertension, hyperlipidemia EXAM: BILATERAL CAROTID DUPLEX ULTRASOUND TECHNIQUE: Pearline Cables scale imaging, color Doppler and duplex ultrasound were performed of bilateral carotid and vertebral arteries in the neck. COMPARISON:  No prior duplex FINDINGS: Criteria: Quantification of carotid stenosis is based on velocity parameters that correlate the residual internal carotid diameter with NASCET-based stenosis levels, using the diameter of the distal internal carotid lumen as the denominator for stenosis measurement. The following velocity measurements were obtained: RIGHT ICA:  Systolic 989 cm/sec, Diastolic 28 cm/sec CCA:  211 cm/sec SYSTOLIC ICA/CCA RATIO:  0.7 ECA:  114 cm/sec LEFT ICA:  Systolic 941 cm/sec, Diastolic 22 cm/sec CCA:  740 cm/sec SYSTOLIC ICA/CCA RATIO:  1.1 ECA:  150 cm/sec Right Brachial SBP: Not acquired Left Brachial SBP: Not acquired RIGHT CAROTID ARTERY: No significant calcifications of the right common carotid artery. Intermediate waveform maintained. Heterogeneous and partially calcified plaque at the right carotid bifurcation. No significant lumen shadowing. Low resistance waveform of the right ICA. No significant  tortuosity. RIGHT VERTEBRAL ARTERY: Antegrade flow with low resistance waveform. LEFT CAROTID ARTERY: No significant calcifications of the left common carotid artery. Intermediate waveform maintained. Heterogeneous and partially calcified plaque at the left carotid bifurcation without significant lumen shadowing. Low resistance waveform of the left ICA. No significant tortuosity. LEFT VERTEBRAL ARTERY:  Antegrade flow with low resistance waveform. IMPRESSION: Color duplex indicates minimal heterogeneous and calcified plaque, with no hemodynamically significant stenosis by duplex criteria in the extracranial cerebrovascular circulation. Signed, Dulcy Fanny. Earleen Newport, DO Vascular and Interventional Radiology Specialist [facility] Radiology Electronically Signed   By: Corrie Mckusick D.O.   On: 11/23/2017 09:04   Dg Chest Portable 1 View  Result Date: 11/22/2017 CLINICAL DATA:  84 year old female postoperative day 2 right shoulder replacement. Pain and weakness. EXAM: PORTABLE CHEST 1 VIEW COMPARISON:  11/20/2017 postoperative radiographs and earlier. FINDINGS: Supine AP chest at 2014 hours. Partially visible right total shoulder arthroplasty appears stable. Skin staples remain in place. Low lung volumes, but no pneumothorax, pulmonary edema, pleural effusion or confluent pulmonary opacity. Extensive Calcified aortic atherosclerosis. Other mediastinal contours are within normal limits. Visualized tracheal air column is within normal limits. Negative visible bowel gas pattern. IMPRESSION: 1.  No acute cardiopulmonary abnormality. 2. Partially visible right total shoulder arthroplasty hardware appears stable. 3.  Aortic Atherosclerosis (ICD10-I70.0). Electronically Signed   By: Genevie Ann M.D.   On: 11/22/2017 21:17   Dg Hip Unilat W Or Wo Pelvis 2-3 Views Right  Result Date: 11/22/2017 CLINICAL DATA:  84 year old female with increased weakness x2 days. EXAM: DG HIP (WITH OR WITHOUT PELVIS) 2-3V RIGHT COMPARISON:  None.  FINDINGS: There is no acute fracture or dislocation bilateral hip total arthroplasty noted. The arthroplasty components appear intact and in anatomic alignment. No bone lucency noted to suggest loosening. Lower lumbar degenerative changes and fixation screws noted. The bones are osteopenic.  The soft tissues appear unremarkable. IMPRESSION: No acute fracture or dislocation. Electronically Signed   By: Anner Crete M.D.   On: 11/22/2017 21:21     Assessment/Plan: 84 year old female presenting with inability to ambulate.  Underwent shoulder surgery on 3/22.  MRI of the brain shows no acute changes.  Patient ambulated with therapy 82feet today.  Do not suspect acute infarct but patient may be having some reaction to pain medications since patient with infarcts in the past seen on imaging.  Proximal lower extremities quite weak though.  Will rule out cord abnormality.    Recommendations: 1.  Continue therapy 2.  MRI of the lumbar spine 3.  Continue ASA daily  Alexis Goodell, MD Neurology [phone] 11/23/2017, 1:09 PM

## 2017-11-23 NOTE — Progress Notes (Signed)
OT Cancellation Note  Patient Details Name: Robin Alexander MRN: [id number] DOB: 19-Aug-1933   Cancelled Treatment:    Reason Eval/Treat Not Completed: Patient at procedure or test/ unavailable. Order received, chart reviewed. Pt out of room for diagnostic testing again in pm. Briefly spoke to her husband and son who were in the room.  Will re-attempt OT evaluation at later date/time as pt is available and medically appropriate  Chrys Racer, OTR/L ascom [phone] 11/23/17, 3:16 PM

## 2017-11-24 DIAGNOSIS — M48061 Spinal stenosis, lumbar region without neurogenic claudication: Secondary | ICD-10-CM

## 2017-11-24 LAB — SURGICAL PATHOLOGY

## 2017-11-24 MED ORDER — ENOXAPARIN SODIUM 30 MG/0.3ML ~~LOC~~ SOLN
30.0000 mg | SUBCUTANEOUS | Status: DC
  Administered 2017-11-24: 30 mg via SUBCUTANEOUS
  Filled 2017-11-24: qty 0.3

## 2017-11-24 MED ORDER — TRAMADOL HCL 50 MG PO TABS
25.0000 mg | ORAL_TABLET | Freq: Four times a day (QID) | ORAL | Status: DC | PRN
  Administered 2017-11-25 (×2): 25 mg via ORAL
  Filled 2017-11-24 (×2): qty 1

## 2017-11-24 MED ORDER — ENOXAPARIN SODIUM 40 MG/0.4ML ~~LOC~~ SOLN: 40.0000 mg | SUBCUTANEOUS | Status: DC

## 2017-11-24 MED ORDER — METHOCARBAMOL 500 MG PO TABS
500.0000 mg | ORAL_TABLET | Freq: Three times a day (TID) | ORAL | Status: DC | PRN
  Administered 2017-11-25 (×2): 500 mg via ORAL
  Filled 2017-11-24 (×2): qty 1

## 2017-11-24 NOTE — Progress Notes (Signed)
Clinical Education officer, museum (CSW) presented bed offers to patient, her husband [name] and son [name]. They wanted [facility] however patient has an open claim with [employer] in 2017 and [facility] did not make a bed offer. Patient chose [facility]. Per [facility] admissions coordinator at [facility] she will start [facility] authorization.   [employer], LCSW [phone]

## 2017-11-24 NOTE — Clinical Social Work Placement (Signed)
   CLINICAL SOCIAL WORK PLACEMENT  NOTE  Date:  11/24/2017  Patient Details  Name: Robin Alexander MRN: [id number] Date of Birth: 02-23-1933  Clinical Social Work is seeking post-discharge placement for this patient at the [place] level of care (*CSW will initial, date and re-position this form in  chart as items are completed):  Yes   Patient/family provided with [facility] Work Department's list of facilities offering this level of care within the geographic area requested by the patient (or if unable, by the patient's family).  Yes   Patient/family informed of their freedom to choose among providers that offer the needed level of care, that participate in Medicare, Medicaid or managed care program needed by the patient, have an available bed and are willing to accept the patient.  Yes   Patient/family informed of [facility]'s ownership interest in [facility] and [facility], as well as of the fact that they are under no obligation to receive care at these facilities.  PASRR submitted to EDS on 11/24/17     PASRR number received on 11/24/17     Existing PASRR number confirmed on       FL2 transmitted to all facilities in geographic area requested by pt/family on 11/24/17     FL2 transmitted to all facilities within larger geographic area on       Patient informed that his/her managed care company has contracts with or will negotiate with certain facilities, including the following:            Patient/family informed of bed offers received.  Patient chooses bed at       Physician recommends and patient chooses bed at      Patient to be transferred to   on  .  Patient to be transferred to facility by       Patient family notified on   of transfer.  Name of family member notified:        PHYSICIAN       Additional Comment:    _______________________________________________ Breyana Follansbee, [name], LCSW 11/24/2017, 12:08 PM

## 2017-11-24 NOTE — [place] FL2 (Signed)
[place] LEVEL OF CARE SCREENING TOOL     IDENTIFICATION  Patient Name: Robin Alexander Birthdate: Mar 19, 1933 Sex: female Admission Date (Current Location): 11/22/2017  [place] and [place] Number:  Engineering geologist and Address:  [facility], [address], [place], [place] [address]      Provider Number: [id number]  Attending Physician Name and Address:  Hillary Bow, MD  Relative Name and Phone Number:       Current Level of Care: Hospital Recommended Level of Care: [place] Prior Approval Number:    Date Approved/Denied:   PASRR Number: [id number])  Discharge Plan: SNF    Current Diagnoses: Patient Active Problem List   Diagnosis Date Noted  . Foot drop 11/23/2017  . Status post shoulder replacement 11/20/2017  . Postmenopausal estrogen deficiency 10/15/2017  . Localized osteoarthritis of right shoulder 10/15/2017  . Difficulty sleeping 10/15/2017  . Acute bronchitis 12/29/2016  . Bilateral knee pain 06/23/2014  . Lumbar radiculopathy, chronic 06/23/2014  . Aortic stenosis, mild 11/03/2013  . Right bundle branch block 05/04/2013  . Hyperlipidemia 06/29/2012  . GERD (gastroesophageal reflux disease) 07/31/2011  . Hypertension 07/31/2011    Orientation RESPIRATION BLADDER Height & Weight     Self, Time, Situation, Place  O2(2L Nasal Cannula) Continent Weight: 158 lb 11.7 oz (72 kg) Height:  4\' 11"  (149.9 cm)  BEHAVIORAL SYMPTOMS/MOOD NEUROLOGICAL BOWEL NUTRITION STATUS      Continent Diet(Heart Healthy)  AMBULATORY STATUS COMMUNICATION OF NEEDS Skin   Extensive Assist Verbally Surgical wounds(Incision Right Shoulder)                       Personal Care Assistance Level of Assistance  Bathing, Feeding, Dressing Bathing Assistance: Limited assistance Feeding assistance: Independent Dressing Assistance: Limited assistance     Functional Limitations Info  Sight, Hearing, Speech Sight  Info: Adequate Hearing Info: Adequate Speech Info: Adequate    SPECIAL CARE FACTORS FREQUENCY  PT (By licensed PT), OT (By licensed OT)     PT Frequency: (5) OT Frequency: (5)            Contractures      Additional Factors Info  Code Status, Allergies Code Status Info: (Full Code) Allergies Info: (ADHESIVE TAPE, CODEINE )           Current Medications (11/24/2017):  This is the current hospital active medication list Current Facility-Administered Medications  Medication Dose Route Frequency Provider Last Rate Last Dose  . amLODipine (NORVASC) tablet 5 mg  5 mg Oral Daily Harrie Foreman, MD   5 mg at 11/23/17 3382   And  . benazepril (LOTENSIN) tablet 20 mg  20 mg Oral Daily Harrie Foreman, MD   20 mg at 11/23/17 0950  . aspirin EC tablet 325 mg  325 mg Oral Daily Harrie Foreman, MD   325 mg at 11/23/17 0950  . famotidine (PEPCID) tablet 20 mg  20 mg Oral BID Harrie Foreman, MD   20 mg at 11/23/17 2143  . magnesium oxide (MAG-OX) tablet 200 mg  200 mg Oral Daily Harrie Foreman, MD   200 mg at 11/23/17 0951  . MEDLINE mouth rinse  15 mL Mouth Rinse BID Harrie Foreman, MD   15 mL at 11/23/17 1104  . methocarbamol (ROBAXIN) tablet 500 mg  500 mg Oral Q8H PRN Sudini, Alveta Heimlich, MD      . multivitamin with minerals tablet 1 tablet  1 tablet  Oral Daily Harrie Foreman, MD   1 tablet at 11/23/17 [phone]  . omega-3 acid ethyl esters (LOVAZA) capsule 1 g  1 g Oral BID Harrie Foreman, MD   1 g at 11/23/17 2143  . ondansetron (ZOFRAN) tablet 4 mg  4 mg Oral Q6H PRN Hillary Bow, MD   4 mg at 11/23/17 0957  . polyethylene glycol (MIRALAX / GLYCOLAX) packet 17 g  17 g Oral Daily Hillary Bow, MD   17 g at 11/23/17 1544  . simvastatin (ZOCOR) tablet 20 mg  20 mg Oral QHS Harrie Foreman, MD   20 mg at 11/23/17 2143  . traMADol (ULTRAM) tablet 25 mg  25 mg Oral Q6H PRN Sudini, Alveta Heimlich, MD      . vitamin B-12 (CYANOCOBALAMIN) tablet 6,000 mcg  6,000 mcg Oral  Daily Harrie Foreman, MD   6,000 mcg at 11/23/17 4401     Discharge Medications: Please see discharge summary for a list of discharge medications.  Relevant Imaging Results:  Relevant Lab Results:   Additional Information (SSN: [id number])  [name], Student-Social Work

## 2017-11-24 NOTE — Evaluation (Signed)
Occupational Therapy Evaluation Patient Details Name: Robin Alexander MRN: [id number] DOB: 08/05/1933 Today's Date: 11/24/2017    History of Present Illness Pt is an 84 y.o. female presenting to hospital 11/22/17 with weakness, difficulty ambulating, and dragging of R foot; also reports of hallucinating seeing puffs of smoke.  Of note, pt s/p R reverse shoulder arthroplasty with biceps tenodesis 11/20/17 and discharged home 11/21/17.  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  PMH includes B knee pain, htn, chronic lumbar radiculopathy, B THA, back sx, h/o L ankle fx (pt reports August 2018).   Clinical Impression   Pt seen for OT evaluation this date. Pt with recent R rTSA w/ bicep tenodesis on 3/22, admitted now for BLE weakess. Son and spouse present, with son expressing concerns about pt's return home due to weakness and spouse's inability to provide any physical assist. Pt notes that since return home she had only been ambulating to/from the bathroom. Pt presents with RUE in shoulder sling/immobilizer and polar care. OT adjusted to optimize positioning. Polar care noted to be warm. CNA notified of need for fresh ice. Pt performed bed mobility with min-mod assist and able to take small unsteady side steps EOB with SPC in L hand to get to recliner with CGA- min assist. Pt on 2L O2, 92% at start of session in bed, improving to 97% with mobility. Pt demonstrates deficits in activity tolerance, BLE strength, balance, and impaired UE functional use due to recent reverse TSA. Pt will benefit from skilled OT services to address noted impairments and functional deficits in order to maximize safety and return to PLOF. Recommend pt transition to STR in order to facilitate ultimate safe return home.     Follow Up Recommendations  SNF    Equipment Recommendations  None recommended by OT    Recommendations for Other Services       Precautions / Restrictions Precautions Precautions: Shoulder;Fall Shoulder  Interventions: Shoulder abduction pillow;Shoulder sling/immobilizer Precaution Booklet Issued: No Restrictions Weight Bearing Restrictions: Yes RUE Weight Bearing: Non weight bearing      Mobility Bed Mobility Overal bed mobility: Needs Assistance Bed Mobility: Supine to Sit     Supine to sit: Min assist;Mod assist;HOB elevated     General bed mobility comments: Deferred (pt up in chair beginning/end of session)  Transfers Overall transfer level: Needs assistance Equipment used: Straight cane Transfers: Sit to/from [facility] Sit to Stand: Min guard;Min assist Stand pivot transfers: Min guard;Min assist           Balance Overall balance assessment: Needs assistance;History of Falls Sitting-balance support: Feet supported;Single extremity supported Sitting balance-Leahy Scale: Good Sitting balance - Comments: dynamic sitting   Standing balance support: No upper extremity supported Standing balance-Leahy Scale: Fair Standing balance comment: initial posterior lean with pt bracing BLE against bed but with cues able to bring body weight forward                           ADL either performed or assessed with clinical judgement   ADL Overall ADL's : Needs assistance/impaired Eating/Feeding: Sitting;Modified independent   Grooming: Sitting;Modified independent   Upper Body Bathing: Sitting;Moderate assistance   Lower Body Bathing: Sit to/from stand;Moderate assistance;Maximal assistance   Upper Body Dressing : Sitting;Moderate assistance   Lower Body Dressing: Sit to/from stand;Moderate assistance;Maximal assistance   Toilet Transfer: Ambulation;Min guard;Minimal assistance Toilet Transfer Details (indicate cue type and reason): w/ SPC  Functional mobility during ADLs: Cane;Min guard;Minimal assistance       Vision Patient Visual Report: No change from baseline       Perception     Praxis      Pertinent Vitals/Pain  Pain Assessment: No/denies pain Pain Intervention(s): Limited activity within patient's tolerance;Monitored during session;Repositioned     Hand Dominance Right   Extremity/Trunk Assessment Upper Extremity Assessment Upper Extremity Assessment: RUE deficits/detail(LUE WFL) RUE Deficits / Details: OT adjusted polar care and shoulder immobilizer  RUE: Unable to fully assess due to immobilization   Lower Extremity Assessment Lower Extremity Assessment: Defer to PT evaluation;Generalized weakness(hip flexors weaker bilaterally, grossly weak)   Cervical / Trunk Assessment Cervical / Trunk Assessment: Normal   Communication Communication Communication: No difficulties   Cognition Arousal/Alertness: Awake/alert Behavior During Therapy: WFL for tasks assessed/performed Overall Cognitive Status: Within Functional Limits for tasks assessed                                     General Comments  RUE polar care and shoulder sling/immobilizer w/ abduction pillow in place, CNA notified of need for fresh ice in polar care    Exercises Other Exercises Other Exercises: Pt instructed in techniques to improve standing balance and stability, able to perform side steps from EOB to recliner    Shoulder Instructions      Home Living Family/patient expects to be discharged to:: Skilled nursing facility Living Arrangements: Spouse/significant other Available Help at Discharge: Family Type of Home: House Home Access: Stairs to enter Technical brewer of Steps: 6 Entrance Stairs-Rails: Can reach both Home Layout: Able to live on main level with bedroom/bathroom               Home Equipment: Cane - single point          Prior Functioning/Environment Level of Independence: Independent with assistive device(s)        Comments: Uses SPC for community distances, furniture holds in home environment. Active and bikes on stationary bike daily.        OT Problem List:  Impaired balance (sitting and/or standing);Decreased activity tolerance;Impaired UE functional use;Decreased strength      OT Treatment/Interventions: Self-care/ADL training;Balance training;Therapeutic exercise;Therapeutic activities;DME and/or AE instruction;Patient/family education    OT Goals(Current goals can be found in the care plan section) Acute Rehab OT Goals Patient Stated Goal: get better and return to PLOF OT Goal Formulation: With patient/family Time For Goal Achievement: 12/08/17 Potential to Achieve Goals: Good ADL Goals Pt Will Perform Lower Body Dressing: with min assist;sit to/from stand;with adaptive equipment Pt Will Transfer to Toilet: with min guard assist;ambulating(comfort height toilet, SPC for amb) Additional ADL Goal #1: Pt will perform bed mobility to the L side with supervision and no LOB.  OT Frequency: Min 2X/week   Barriers to D/C: Decreased caregiver support;Inaccessible home environment          Co-evaluation              AM-PAC PT "6 Clicks" Daily Activity     Outcome Measure Help from another person eating meals?: None Help from another person taking care of personal grooming?: A Little Help from another person toileting, which includes using toliet, bedpan, or urinal?: A Little Help from another person bathing (including washing, rinsing, drying)?: A Lot Help from another person to put on and taking off regular upper body clothing?: A Lot Help from another person  to put on and taking off regular lower body clothing?: A Lot 6 Click Score: 16   End of Session Equipment Utilized During Treatment: Gait belt;Oxygen  Activity Tolerance: Patient tolerated treatment well Patient left: in chair;with call bell/phone within reach;with chair alarm set;with family/visitor present  OT Visit Diagnosis: Muscle weakness (generalized) (M62.81);Unsteadiness on feet (R26.81)                Time: 5784-6962 OT Time Calculation (min): 42 min Charges:  OT  General Charges $OT Visit: 1 Visit OT Evaluation $OT Eval Low Complexity: 1 Low OT Treatments $Self Care/Home Management : 8-22 mins  Jeni Salles, MPH, MS, OTR/L ascom [phone] 11/24/17, 10:54 AM

## 2017-11-24 NOTE — Clinical Social Work Note (Addendum)
Clinical Social Work Assessment  Patient Details  Name: Robin Alexander MRN: [id number] Date of Birth: [birth year]  Date of referral:  11/24/17               Reason for consult:  Facility Placement                Permission sought to share information with:  Chartered certified accountant granted to share information::  Yes, Verbal Permission Granted  Name::      [place]::   [place]  Relationship::     Contact Information:     Housing/Transportation Living arrangements for the past 2 months:  [place] of Information:  Patient, Power of [place], Adult Children Patient Interpreter Needed:  None Criminal Activity/Legal Involvement Pertinent to Current Situation/Hospitalization:  Yes Significant Relationships:  Adult Children, Spouse Lives with:  Spouse Do you feel safe going back to the place where you live?  Yes Need for family participation in patient care:  Yes (Comment)  Care giving concerns: Patient lives in [name] with husband [name] [phone]).   Social Worker assessment / plan: Holiday representative (CSW) received SNF consult. PT is recommending SNF. Social work Theatre manager met with patient, patient's husband, and son/HPOA [name] [phone]) at bedside. Patient was sitting up in chair alert and oriented x4. Social work Theatre manager introduced self and explained the role of the [place]. Patient lives in [place] with her husband [name]. Social work Theatre manager explained that PT is recommending SNF and [employer] will have to approve it. Patient and her family members verbalized their understanding and is agreeable to SNF placement. Patient prefers [facility]. FL2 completed and faxed out. CSW and social work Theatre manager will continue to follow up and assist.  Employment status:  Retired Nurse, adult PT Recommendations:  [place] / Referral to community resources:  [place]  Patient/Family's Response to care: Patient is agreeable to SNF placement and prefers [facility].  Patient/Family's Understanding of and Emotional Response to Diagnosis, Current Treatment, and Prognosis: Patient and her family members were pleasant and thanked social work Theatre manager for her assistance.  Emotional Assessment Appearance:  Appears stated age Attitude/Demeanor/Rapport:    Affect (typically observed):  Accepting, Calm, Pleasant Orientation:  Oriented to Self, Oriented to Place, Oriented to  Time, Oriented to Situation Alcohol / Substance use:  Not Applicable Psych involvement (Current and /or in the community):  No (Comment)  Discharge Needs  Concerns to be addressed:  Care Coordination, Discharge Planning Concerns Readmission within the last 30 days:  No Current discharge risk:  Dependent with Mobility Barriers to Discharge:  Continued Medical Work up   [name], Student-Social Work 11/24/2017, 10:36 AM

## 2017-11-24 NOTE — Progress Notes (Signed)
[facility] NAME: Robin Alexander    MR#:  [id number]  DATE OF BIRTH:  April 21, 1933  SUBJECTIVE:  CHIEF COMPLAINT:   Chief Complaint  Patient presents with  . Weakness  . Eye Problem  . Post-op Problem   Continues to feel weak all over and more in the lower extremities. Family at bedside.  REVIEW OF SYSTEMS:    Review of Systems  Constitutional: Positive for malaise/fatigue. Negative for chills and fever.  HENT: Negative for sore throat.   Eyes: Negative for blurred vision, double vision and pain.  Respiratory: Negative for cough, hemoptysis, shortness of breath and wheezing.   Cardiovascular: Negative for chest pain, palpitations, orthopnea and leg swelling.  Gastrointestinal: Negative for abdominal pain, constipation, diarrhea, heartburn, nausea and vomiting.  Genitourinary: Negative for dysuria and hematuria.  Musculoskeletal: Positive for back pain and joint pain.  Skin: Negative for rash.  Neurological: Negative for sensory change, speech change, focal weakness and headaches.  Endo/Heme/Allergies: Does not bruise/bleed easily.  Psychiatric/Behavioral: Negative for depression. The patient is not nervous/anxious.     DRUG ALLERGIES:   Allergies  Allergen Reactions  . Adhesive [Tape]     Paper tape okay  . Codeine Itching    VITALS:  Blood pressure (!) 127/44, pulse 76, temperature 99 F (37.2 C), temperature source Oral, resp. rate 19, height 4\' 11"  (1.499 m), weight 72 kg (158 lb 11.7 oz), SpO2 (!) 88 %.  PHYSICAL EXAMINATION:   Physical Exam  GENERAL:  84 y.o.-year-old patient lying in the bed with no acute distress.  EYES: Pupils equal, round, reactive to light and accommodation. No scleral icterus. Extraocular muscles intact.  HEENT: Head atraumatic, normocephalic. Oropharynx and nasopharynx clear.  NECK:  Supple, no jugular venous distention. No thyroid enlargement, no tenderness.  LUNGS: Normal breath  sounds bilaterally, no wheezing, rales, rhonchi. No use of accessory muscles of respiration.  CARDIOVASCULAR: S1, S2 normal. No murmurs, rubs, or gallops.  ABDOMEN: Soft, nontender, nondistended. Bowel sounds present. No organomegaly or mass.  EXTREMITIES: No cyanosis, clubbing or edema b/l.    NEUROLOGIC: Cranial nerves II through XII are intact. Limited assessment of right upper extremity due to being in a sling. Lower extremities 4/5. PSYCHIATRIC: The patient is alert and oriented x 3.  SKIN: No obvious rash, lesion, or ulcer.   LABORATORY PANEL:   CBC Recent Labs  Lab 11/22/17 2010  WBC 9.0  HGB 8.9*  HCT 26.3*  PLT 151   ------------------------------------------------------------------------------------------------------------------ Chemistries  Recent Labs  Lab 11/22/17 2010  NA 134*  K 4.2  CL 99*  CO2 25  GLUCOSE 119*  BUN 38*  CREATININE 1.32*  CALCIUM 9.2  AST 40  ALT 8*  ALKPHOS 57  BILITOT 0.9   ------------------------------------------------------------------------------------------------------------------  Cardiac Enzymes Recent Labs  Lab 11/22/17 2010  TROPONINI <0.03   ------------------------------------------------------------------------------------------------------------------  RADIOLOGY:  Ct Head Wo Contrast  Result Date: 11/22/2017 CLINICAL DATA:  Weakness 2 days with visual changes. Shoulder replacement 2 days ago. Right-sided weakness with difficulty ambulating. EXAM: CT HEAD WITHOUT CONTRAST TECHNIQUE: Contiguous axial images were obtained from the base of the skull through the vertex without intravenous contrast. COMPARISON:  None. FINDINGS: Brain: Ventricles, cisterns and other CSF spaces are within normal. There is moderate chronic ischemic microvascular disease. Small old lacune infarct over the right lentiform nucleus. Old right cerebellar infarct. No mass, mass effect, shift of midline structures or acute hemorrhage. No acute  infarction. Vascular: No hyperdense vessel or  unexpected calcification. Skull: Normal. Negative for fracture or focal lesion. Sinuses/Orbits: No acute finding. Other: None. IMPRESSION: No acute findings. Moderate chronic ischemic microvascular disease. Old right basal ganglia lacunar infarct and old right cerebellar infarct. Electronically Signed   By: Marin Olp M.D.   On: 11/22/2017 21:20   Mr Brain Wo Contrast  Result Date: 11/23/2017 CLINICAL DATA:  Difficulty walking.  Recent shoulder surgery. EXAM: MRI HEAD WITHOUT CONTRAST TECHNIQUE: Multiplanar, multiecho pulse sequences of the brain and surrounding structures were obtained without intravenous contrast. COMPARISON:  Head CT 11/22/2017 FINDINGS: Brain: The midline structures are normal. There is no acute infarct or acute hemorrhage. No mass lesion, hydrocephalus, dural abnormality or extra-axial collection. Early confluent hyperintense T2-weighted signal of the periventricular and deep white matter, most commonly due to chronic ischemic microangiopathy. Old right basal ganglia and bilateral cerebellar infarcts. No age-advanced or lobar predominant atrophy. No chronic microhemorrhage or superficial siderosis. Vascular: Major intracranial arterial and venous sinus flow voids are preserved. Skull and upper cervical spine: The visualized skull base, calvarium, upper cervical spine and extracranial soft tissues are normal. Sinuses/Orbits: No fluid levels or advanced mucosal thickening. No mastoid or middle ear effusion. Normal orbits. IMPRESSION: Severe chronic ischemic microangiopathy and multiple old infarcts without acute intracranial abnormality. Electronically Signed   By: Ulyses Jarred M.D.   On: 11/23/2017 01:44   Mr Lumbar Spine Wo Contrast  Result Date: 11/23/2017 CLINICAL DATA:  84 y/o  F; difficulty walking and weakness. EXAM: MRI LUMBAR SPINE WITHOUT CONTRAST TECHNIQUE: Multiplanar, multisequence MR imaging of the lumbar spine was performed.  No intravenous contrast was administered. COMPARISON:  03/13/2016 lumbar spine MRI. FINDINGS: Segmentation:  Standard. Alignment:  L4-5 stable grade 1 anterolisthesis. Vertebrae: L3-4 right lateral vertebral body and pedicle fusion. Susceptibility artifact from fusion hardware partially obscures the vertebral bodies and right-sided neural foramen at that level. Mild opposing endplate edema at G0-1, likely degenerative. No evidence for acute fracture or discitis. Stable chronic anterior compression deformity of L1 vertebral body. Conus medullaris and cauda equina: Conus extends to the L2 level. Conus and cauda equina appear normal. Paraspinal and other soft tissues: Bilateral renal cysts measuring up to 5.2 cm in left lower pole. Disc levels: L1-2: Stable small disc bulge eccentric to the right with mild right foraminal stenosis. No canal stenosis. L2-3: Progression of disc bulge with endplate marginal osteophytes as well as facet and ligamentum flavum hypertrophy. Mild foraminal stenosis and moderate to severe canal stenosis. L3-4: Stable small disc bulge with mild facet hypertrophy and small central sequestered disc fragment at the L3 infra pedicular level. No significant foraminal stenosis. Mild canal stenosis. L4-5: Anterolisthesis and facet hypertrophy. Right neural foramen is partially obscured by susceptibility artifact. Patent neural foramen. Mild canal stenosis. L5-S1: No significant disc displacement, foraminal stenosis, or canal stenosis. IMPRESSION: 1. No acute osseous abnormality. 2. Stable L4-5 grade 1 anterolisthesis. Stable chronic L1 mild anterior compression deformity. L3-4 spinal fusion postsurgical changes without apparent complication. 3. Mild progression of lumbar spondylosis greatest at the L2-3 level where there is multifactorial moderate to severe canal stenosis. Electronically Signed   By: Kristine Garbe M.D.   On: 11/23/2017 14:59   US Carotid Bilateral (at [facility]  Only)  Result Date: 11/23/2017 CLINICAL DATA:  84 year old female with a history of stroke. Cardiovascular risk factors include hypertension, hyperlipidemia EXAM: BILATERAL CAROTID DUPLEX ULTRASOUND TECHNIQUE: Pearline Cables scale imaging, color Doppler and duplex ultrasound were performed of bilateral carotid and vertebral arteries in the neck. COMPARISON:  No  prior duplex FINDINGS: Criteria: Quantification of carotid stenosis is based on velocity parameters that correlate the residual internal carotid diameter with NASCET-based stenosis levels, using the diameter of the distal internal carotid lumen as the denominator for stenosis measurement. The following velocity measurements were obtained: RIGHT ICA:  Systolic 382 cm/sec, Diastolic 28 cm/sec CCA:  505 cm/sec SYSTOLIC ICA/CCA RATIO:  0.7 ECA:  114 cm/sec LEFT ICA:  Systolic 397 cm/sec, Diastolic 22 cm/sec CCA:  673 cm/sec SYSTOLIC ICA/CCA RATIO:  1.1 ECA:  150 cm/sec Right Brachial SBP: Not acquired Left Brachial SBP: Not acquired RIGHT CAROTID ARTERY: No significant calcifications of the right common carotid artery. Intermediate waveform maintained. Heterogeneous and partially calcified plaque at the right carotid bifurcation. No significant lumen shadowing. Low resistance waveform of the right ICA. No significant tortuosity. RIGHT VERTEBRAL ARTERY: Antegrade flow with low resistance waveform. LEFT CAROTID ARTERY: No significant calcifications of the left common carotid artery. Intermediate waveform maintained. Heterogeneous and partially calcified plaque at the left carotid bifurcation without significant lumen shadowing. Low resistance waveform of the left ICA. No significant tortuosity. LEFT VERTEBRAL ARTERY:  Antegrade flow with low resistance waveform. IMPRESSION: Color duplex indicates minimal heterogeneous and calcified plaque, with no hemodynamically significant stenosis by duplex criteria in the extracranial cerebrovascular circulation. Signed, Dulcy Fanny.  Earleen Newport, DO Vascular and Interventional Radiology Specialist [facility] Radiology Electronically Signed   By: Corrie Mckusick D.O.   On: 11/23/2017 09:04   Dg Chest Portable 1 View  Result Date: 11/22/2017 CLINICAL DATA:  84 year old female postoperative day 2 right shoulder replacement. Pain and weakness. EXAM: PORTABLE CHEST 1 VIEW COMPARISON:  11/20/2017 postoperative radiographs and earlier. FINDINGS: Supine AP chest at 2014 hours. Partially visible right total shoulder arthroplasty appears stable. Skin staples remain in place. Low lung volumes, but no pneumothorax, pulmonary edema, pleural effusion or confluent pulmonary opacity. Extensive Calcified aortic atherosclerosis. Other mediastinal contours are within normal limits. Visualized tracheal air column is within normal limits. Negative visible bowel gas pattern. IMPRESSION: 1.  No acute cardiopulmonary abnormality. 2. Partially visible right total shoulder arthroplasty hardware appears stable. 3.  Aortic Atherosclerosis (ICD10-I70.0). Electronically Signed   By: Genevie Ann M.D.   On: 11/22/2017 21:17   Dg Hip Unilat W Or Wo Pelvis 2-3 Views Right  Result Date: 11/22/2017 CLINICAL DATA:  84 year old female with increased weakness x2 days. EXAM: DG HIP (WITH OR WITHOUT PELVIS) 2-3V RIGHT COMPARISON:  None. FINDINGS: There is no acute fracture or dislocation bilateral hip total arthroplasty noted. The arthroplasty components appear intact and in anatomic alignment. No bone lucency noted to suggest loosening. Lower lumbar degenerative changes and fixation screws noted. The bones are osteopenic. The soft tissues appear unremarkable. IMPRESSION: No acute fracture or dislocation. Electronically Signed   By: Anner Crete M.D.   On: 11/22/2017 21:21     ASSESSMENT AND PLAN:   *Bilateral lower extremity weakness.  Likely generalized deconditioning.  MRI of the brain showed old strokes.  Nothing acute.  MRI of the lumbar spine shows spinal stenosis.  Will  wait for further neurology input.  Appreciate help. Physical therapy evaluated patient.  Will need skilled nursing facility at discharge for ongoing physical therapy.  *Recent right shoulder surgery.  Continue occupational therapy and physical therapy.  Pain controlled with tramadol.  *Hypertension.  Continue home medications  DVT prophylaxis with Lovenox  All the records are reviewed and case discussed with Care Management/Social Worker Management plans discussed with the patient, family and they are in agreement.  CODE STATUS:  FULL CODE  DVT Prophylaxis: SCDs  TOTAL TIME TAKING CARE OF THIS PATIENT: 35 minutes.   POSSIBLE D/C IN 1-2 DAYS, DEPENDING ON CLINICAL CONDITION.  Leia Alf Audrinna Sherman M.D on 11/24/2017 at 10:45 AM  Between 7am to 6pm - Pager - [phone]  After 6pm go to [email or web address] - password [email or web address] [facility]  Office  [phone]  CC: Primary care physician; Leone Haven, MD  Note: This dictation was prepared with Dragon dictation along with smaller phrase technology. Any transcriptional errors that result from this process are unintentional.

## 2017-11-24 NOTE — Progress Notes (Signed)
Subjective: Patient walked some further with PT today.  Able to go 25 feet.    Objective: Current vital signs: BP (!) 127/44 (BP Location: Left Arm)   Pulse 76   Temp 99 F (37.2 C) (Oral)   Resp 19   Ht 4\' 11"  (1.499 m)   Wt 72 kg (158 lb 11.7 oz)   SpO2 (!) 88%   BMI 32.06 kg/m  Vital signs in last 24 hours: Temp:  [99 F (37.2 C)-99.6 F (37.6 C)] 99 F (37.2 C) (03/26 0439) Pulse Rate:  [76-82] 76 (03/26 0439) Resp:  [19] 19 (03/26 0439) BP: (100-127)/(31-44) 127/44 (03/26 0835) SpO2:  [88 %-98 %] 88 % (03/26 0835)  Intake/Output from previous day: No intake/output data recorded. Intake/Output this shift: Total I/O In: 240 [P.O.:240] Out: -  Nutritional status: Fall precautions Diet Heart Room service appropriate? Yes; Fluid consistency: Thin  Neurologic Exam: Mental Status: Alert and awake.  Oriented to person, place and time.  Speech fluent without evidence of aphasia.  Able to follow 3 step commands without difficulty. Cranial Nerves: II: Discs flat bilaterally; Visual fields grossly normal, pupils equal, round, reactive to light and accommodation III,IV, VI: ptosis not present, extra-ocular motions intact bilaterally V,VII: decrease in right NLF, facial light touch sensation normal bilaterally VIII: hearing normal bilaterally IX,X: gag reflex present XI: bilateral shoulder shrug XII: midline tongue extension Motor: Right :  Upper extremity   5/5                                      Left:     Upper extremity   5/5 Patient with hip flexor weakness bilaterally and unable to lift either leg more than a few inches off the recliner (L>R). Patient 5/5 otherwise in the lower extremities. Sensory: Pinprick and light touch intact throughout, bilaterally  Lab Results: Basic Metabolic Panel: Recent Labs  Lab 11/17/17 1616 11/21/17 0413 11/22/17 2010  NA 140 135 134*  K 4.1 4.9 4.2  CL 102 102 99*  CO2 27 25 25   GLUCOSE 110* 137* 119*  BUN 25* 33* 38*   CREATININE 1.06* 1.30* 1.32*  CALCIUM 10.0 8.9 9.2    Liver Function Tests: Recent Labs  Lab 11/22/17 2010  AST 40  ALT 8*  ALKPHOS 57  BILITOT 0.9  PROT 7.0  ALBUMIN 3.8   No results for input(s): LIPASE, AMYLASE in the last 168 hours. No results for input(s): AMMONIA in the last 168 hours.  CBC: Recent Labs  Lab 11/17/17 1616 11/21/17 0413 11/22/17 2010  WBC 7.9 7.8 9.0  NEUTROABS 3.8  --  6.3  HGB 12.8 9.1* 8.9*  HCT 37.7 26.0* 26.3*  MCV 92.3 92.6 91.4  PLT 204 170 151    Cardiac Enzymes: Recent Labs  Lab 11/22/17 2010  TROPONINI <0.03    Lipid Panel: Recent Labs  Lab 11/22/17 2010  CHOL 123  TRIG 92  HDL 36*  CHOLHDL 3.4  VLDL 18  LDLCALC 69    CBG: No results for input(s): GLUCAP in the last 168 hours.  Microbiology: Results for orders placed or performed during the hospital encounter of 11/17/17  Urine culture     Status: Abnormal   Collection Time: 11/17/17  4:16 PM  Result Value Ref Range Status   Specimen Description   Final    URINE, CLEAN CATCH Performed at [facility], [address] [place],  [place] [address]    Special Requests   Final    NONE Performed at [facility], [address] [place], [place] [address]    Culture >=100,000 COLONIES/mL ESCHERICHIA COLI (A)  Final   Report Status 11/19/2017 FINAL  Final   Organism ID, Bacteria ESCHERICHIA COLI (A)  Final      Susceptibility   Escherichia coli - MIC*    AMPICILLIN <=2 SENSITIVE Sensitive     CEFAZOLIN <=4 SENSITIVE Sensitive     CEFTRIAXONE <=1 SENSITIVE Sensitive     CIPROFLOXACIN <=0.25 SENSITIVE Sensitive     GENTAMICIN <=1 SENSITIVE Sensitive     IMIPENEM <=0.25 SENSITIVE Sensitive     NITROFURANTOIN <=16 SENSITIVE Sensitive     TRIMETH/SULFA <=20 SENSITIVE Sensitive     AMPICILLIN/SULBACTAM <=2 SENSITIVE Sensitive     PIP/TAZO <=4 SENSITIVE Sensitive     Extended ESBL NEGATIVE Sensitive     * >=100,000 COLONIES/mL ESCHERICHIA COLI   Surgical pcr screen     Status: None   Collection Time: 11/17/17  4:16 PM  Result Value Ref Range Status   MRSA, PCR NEGATIVE NEGATIVE Final   Staphylococcus aureus NEGATIVE NEGATIVE Final    Comment: (NOTE) The Xpert SA Assay (FDA approved for NASAL specimens in patients 37 years of age and older), is one component of a comprehensive surveillance program. It is not intended to diagnose infection nor to guide or monitor treatment. Performed at [facility], [address] [place], [place] [address]     Coagulation Studies: No results for input(s): LABPROT, INR in the last 72 hours.  Imaging: Ct Head Wo Contrast  Result Date: 11/22/2017 CLINICAL DATA:  Weakness 2 days with visual changes. Shoulder replacement 2 days ago. Right-sided weakness with difficulty ambulating. EXAM: CT HEAD WITHOUT CONTRAST TECHNIQUE: Contiguous axial images were obtained from the base of the skull through the vertex without intravenous contrast. COMPARISON:  None. FINDINGS: Brain: Ventricles, cisterns and other CSF spaces are within normal. There is moderate chronic ischemic microvascular disease. Small old lacune infarct over the right lentiform nucleus. Old right cerebellar infarct. No mass, mass effect, shift of midline structures or acute hemorrhage. No acute infarction. Vascular: No hyperdense vessel or unexpected calcification. Skull: Normal. Negative for fracture or focal lesion. Sinuses/Orbits: No acute finding. Other: None. IMPRESSION: No acute findings. Moderate chronic ischemic microvascular disease. Old right basal ganglia lacunar infarct and old right cerebellar infarct. Electronically Signed   By: Marin Olp M.D.   On: 11/22/2017 21:20   Mr Brain Wo Contrast  Result Date: 11/23/2017 CLINICAL DATA:  Difficulty walking.  Recent shoulder surgery. EXAM: MRI HEAD WITHOUT CONTRAST TECHNIQUE: Multiplanar, multiecho pulse sequences of the brain and surrounding structures were obtained without  intravenous contrast. COMPARISON:  Head CT 11/22/2017 FINDINGS: Brain: The midline structures are normal. There is no acute infarct or acute hemorrhage. No mass lesion, hydrocephalus, dural abnormality or extra-axial collection. Early confluent hyperintense T2-weighted signal of the periventricular and deep white matter, most commonly due to chronic ischemic microangiopathy. Old right basal ganglia and bilateral cerebellar infarcts. No age-advanced or lobar predominant atrophy. No chronic microhemorrhage or superficial siderosis. Vascular: Major intracranial arterial and venous sinus flow voids are preserved. Skull and upper cervical spine: The visualized skull base, calvarium, upper cervical spine and extracranial soft tissues are normal. Sinuses/Orbits: No fluid levels or advanced mucosal thickening. No mastoid or middle ear effusion. Normal orbits. IMPRESSION: Severe chronic ischemic microangiopathy and multiple old infarcts without acute intracranial abnormality. Electronically Signed  By: Ulyses Jarred M.D.   On: 11/23/2017 01:44   Mr Lumbar Spine Wo Contrast  Result Date: 11/23/2017 CLINICAL DATA:  84 y/o  F; difficulty walking and weakness. EXAM: MRI LUMBAR SPINE WITHOUT CONTRAST TECHNIQUE: Multiplanar, multisequence MR imaging of the lumbar spine was performed. No intravenous contrast was administered. COMPARISON:  03/13/2016 lumbar spine MRI. FINDINGS: Segmentation:  Standard. Alignment:  L4-5 stable grade 1 anterolisthesis. Vertebrae: L3-4 right lateral vertebral body and pedicle fusion. Susceptibility artifact from fusion hardware partially obscures the vertebral bodies and right-sided neural foramen at that level. Mild opposing endplate edema at L9-7, likely degenerative. No evidence for acute fracture or discitis. Stable chronic anterior compression deformity of L1 vertebral body. Conus medullaris and cauda equina: Conus extends to the L2 level. Conus and cauda equina appear normal. Paraspinal and  other soft tissues: Bilateral renal cysts measuring up to 5.2 cm in left lower pole. Disc levels: L1-2: Stable small disc bulge eccentric to the right with mild right foraminal stenosis. No canal stenosis. L2-3: Progression of disc bulge with endplate marginal osteophytes as well as facet and ligamentum flavum hypertrophy. Mild foraminal stenosis and moderate to severe canal stenosis. L3-4: Stable small disc bulge with mild facet hypertrophy and small central sequestered disc fragment at the L3 infra pedicular level. No significant foraminal stenosis. Mild canal stenosis. L4-5: Anterolisthesis and facet hypertrophy. Right neural foramen is partially obscured by susceptibility artifact. Patent neural foramen. Mild canal stenosis. L5-S1: No significant disc displacement, foraminal stenosis, or canal stenosis. IMPRESSION: 1. No acute osseous abnormality. 2. Stable L4-5 grade 1 anterolisthesis. Stable chronic L1 mild anterior compression deformity. L3-4 spinal fusion postsurgical changes without apparent complication. 3. Mild progression of lumbar spondylosis greatest at the L2-3 level where there is multifactorial moderate to severe canal stenosis. Electronically Signed   By: Kristine Garbe M.D.   On: 11/23/2017 14:59   US Carotid Bilateral (at [facility] Only)  Result Date: 11/23/2017 CLINICAL DATA:  84 year old female with a history of stroke. Cardiovascular risk factors include hypertension, hyperlipidemia EXAM: BILATERAL CAROTID DUPLEX ULTRASOUND TECHNIQUE: Pearline Cables scale imaging, color Doppler and duplex ultrasound were performed of bilateral carotid and vertebral arteries in the neck. COMPARISON:  No prior duplex FINDINGS: Criteria: Quantification of carotid stenosis is based on velocity parameters that correlate the residual internal carotid diameter with NASCET-based stenosis levels, using the diameter of the distal internal carotid lumen as the denominator for stenosis measurement. The following  velocity measurements were obtained: RIGHT ICA:  Systolic 673 cm/sec, Diastolic 28 cm/sec CCA:  419 cm/sec SYSTOLIC ICA/CCA RATIO:  0.7 ECA:  114 cm/sec LEFT ICA:  Systolic 379 cm/sec, Diastolic 22 cm/sec CCA:  024 cm/sec SYSTOLIC ICA/CCA RATIO:  1.1 ECA:  150 cm/sec Right Brachial SBP: Not acquired Left Brachial SBP: Not acquired RIGHT CAROTID ARTERY: No significant calcifications of the right common carotid artery. Intermediate waveform maintained. Heterogeneous and partially calcified plaque at the right carotid bifurcation. No significant lumen shadowing. Low resistance waveform of the right ICA. No significant tortuosity. RIGHT VERTEBRAL ARTERY: Antegrade flow with low resistance waveform. LEFT CAROTID ARTERY: No significant calcifications of the left common carotid artery. Intermediate waveform maintained. Heterogeneous and partially calcified plaque at the left carotid bifurcation without significant lumen shadowing. Low resistance waveform of the left ICA. No significant tortuosity. LEFT VERTEBRAL ARTERY:  Antegrade flow with low resistance waveform. IMPRESSION: Color duplex indicates minimal heterogeneous and calcified plaque, with no hemodynamically significant stenosis by duplex criteria in the extracranial cerebrovascular circulation. Signed, Dulcy Fanny.  Earleen Newport, DO Vascular and Interventional Radiology Specialist [facility] Radiology Electronically Signed   By: Corrie Mckusick D.O.   On: 11/23/2017 09:04   Dg Chest Portable 1 View  Result Date: 11/22/2017 CLINICAL DATA:  84 year old female postoperative day 2 right shoulder replacement. Pain and weakness. EXAM: PORTABLE CHEST 1 VIEW COMPARISON:  11/20/2017 postoperative radiographs and earlier. FINDINGS: Supine AP chest at 2014 hours. Partially visible right total shoulder arthroplasty appears stable. Skin staples remain in place. Low lung volumes, but no pneumothorax, pulmonary edema, pleural effusion or confluent pulmonary opacity. Extensive Calcified  aortic atherosclerosis. Other mediastinal contours are within normal limits. Visualized tracheal air column is within normal limits. Negative visible bowel gas pattern. IMPRESSION: 1.  No acute cardiopulmonary abnormality. 2. Partially visible right total shoulder arthroplasty hardware appears stable. 3.  Aortic Atherosclerosis (ICD10-I70.0). Electronically Signed   By: Genevie Ann M.D.   On: 11/22/2017 21:17   Dg Hip Unilat W Or Wo Pelvis 2-3 Views Right  Result Date: 11/22/2017 CLINICAL DATA:  84 year old female with increased weakness x2 days. EXAM: DG HIP (WITH OR WITHOUT PELVIS) 2-3V RIGHT COMPARISON:  None. FINDINGS: There is no acute fracture or dislocation bilateral hip total arthroplasty noted. The arthroplasty components appear intact and in anatomic alignment. No bone lucency noted to suggest loosening. Lower lumbar degenerative changes and fixation screws noted. The bones are osteopenic. The soft tissues appear unremarkable. IMPRESSION: No acute fracture or dislocation. Electronically Signed   By: Anner Crete M.D.   On: 11/22/2017 21:21    Medications:  I have reviewed the patient's current medications. Scheduled: . amLODipine  5 mg Oral Daily   And  . benazepril  20 mg Oral Daily  . aspirin  325 mg Oral Daily  . enoxaparin (LOVENOX) injection  30 mg Subcutaneous Q24H  . famotidine  20 mg Oral BID  . magnesium oxide  200 mg Oral Daily  . mouth rinse  15 mL Mouth Rinse BID  . multivitamin with minerals  1 tablet Oral Daily  . omega-3 acid ethyl esters  1 g Oral BID  . polyethylene glycol  17 g Oral Daily  . simvastatin  20 mg Oral QHS  . vitamin B-12  6,000 mcg Oral Daily    Assessment/Plan: Patient continues to progress with therapy.  MRI of the lumbar spine shows many stable, chronic changes but also shows some progression of lumbar spondylosis at L2-3 with moderate to severe canal stenosis at that level.  With patient showing progress would not move to aggressive  interventions at this time.    Recommendations: 1.  Continue PT 2.  Follow up with ortho (previous surgeons)  No further neurologic intervention is recommended at this time.  If further questions arise, please call or page at that time.  Thank you for allowing neurology to participate in the care of this patient.    LOS: 1 day   Alexis Goodell, MD Neurology [phone] 11/24/2017  2:11 PM

## 2017-11-24 NOTE — Progress Notes (Signed)
Pharmacy adjusted dose of Enoxaparin to 30mg  q24 hr based on CrCl < 30 ml/min.  Will continue to monitor and adjust as needed. Charlane Ferretti, RPh  11/24/2017

## 2017-11-24 NOTE — Progress Notes (Signed)
Physical Therapy Treatment Patient Details Name: Robin Alexander MRN: [id number] DOB: 1933-07-27 Today's Date: 11/24/2017    History of Present Illness Pt is an 84 y.o. female presenting to hospital 11/22/17 with weakness, difficulty ambulating, and dragging of R foot; also reports of hallucinating seeing puffs of smoke.  Of note, pt s/p R reverse shoulder arthroplasty with biceps tenodesis 11/20/17 and discharged home 11/21/17.  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  PMH includes B knee pain, htn, chronic lumbar radiculopathy, B THA, back sx, h/o L ankle fx (pt reports August 2018).    PT Comments    Pt able to progress ambulation to 25 feet with SPC with min assist to steady; limited distance d/t fatigue.  During ambulation, good stance time R LE with fairly good ability to advance L LE but decreased stance time noted L LE with increased difficulty advancing R LE (significantly decreasing step length fatigue).  Will continue to focus on strengthening and progressive ambulation per pt tolerance.   Follow Up Recommendations  SNF     Equipment Recommendations  Cane    Recommendations for Other Services       Precautions / Restrictions Precautions Precautions: Shoulder;Fall Shoulder Interventions: Shoulder abduction pillow Restrictions Weight Bearing Restrictions: Yes RUE Weight Bearing: Non weight bearing    Mobility  Bed Mobility               General bed mobility comments: Deferred (pt up in chair beginning/end of session)  Transfers Overall transfer level: Needs assistance Equipment used: Straight cane Transfers: Sit to/from [employer] Transfers Sit to Stand: Min guard;Min assist Stand pivot transfers: Min guard;Min assist       General transfer comment: multiple attempts by pt to stand on own from recliner initially (vc's required to scoot forward and lean forward to stand and then pt able to stand with [facility]); CGA from recliner later in session to stand  and CGA from [facility] (over toilet)  Ambulation/Gait Ambulation/Gait assistance: Min assist;+2 safety/equipment(chair follow) Ambulation Distance (Feet): (25 feet; 7 feet x2 (recliner to toilet and back)) Assistive device: Straight cane   Gait velocity: decreased   General Gait Details: decreased stance time L LE; decreased step length R LE; narrow BOS; increased lateral sway to L; unsteady   Stairs            Wheelchair Mobility    Modified Rankin (Stroke Patients Only)       Balance Overall balance assessment: Needs assistance;History of Falls Sitting-balance support: Feet supported;Single extremity supported Sitting balance-Leahy Scale: Good Sitting balance - Comments: dynamic sitting   Standing balance support: No upper extremity supported Standing balance-Leahy Scale: Fair Standing balance comment: standing washing L hand at sink                            Cognition Arousal/Alertness: Awake/alert Behavior During Therapy: WFL for tasks assessed/performed Overall Cognitive Status: Within Functional Limits for tasks assessed                                        Exercises      General Comments General comments (skin integrity, edema, etc.): R UE abduction sling in place.  Nursing cleared pt for participation in physical therapy.  Pt agreeable to PT session.  Pt's husband present entire session; son present end of session.      Pertinent  Vitals/Pain Pain Assessment: No/denies pain Pain Intervention(s): Limited activity within patient's tolerance;Monitored during session;Repositioned  Vitals (HR and O2 on 2 L via nasal cannula) stable and WFL throughout treatment session.    Home Living                      Prior Function            PT Goals (current goals can now be found in the care plan section) Acute Rehab PT Goals Patient Stated Goal: to go home PT Goal Formulation: With patient Time For Goal Achievement:  12/07/17 Potential to Achieve Goals: Fair Progress towards PT goals: Progressing toward goals    Frequency    7X/week      PT Plan Current plan remains appropriate    Co-evaluation              AM-PAC PT "6 Clicks" Daily Activity  Outcome Measure  Difficulty turning over in bed (including adjusting bedclothes, sheets and blankets)?: Unable Difficulty moving from lying on back to sitting on the side of the bed? : Unable Difficulty sitting down on and standing up from a chair with arms (e.g., wheelchair, bedside commode, etc,.)?: Unable Help needed moving to and from a bed to chair (including a wheelchair)?: A Little Help needed walking in hospital room?: A Little Help needed climbing 3-5 steps with a railing? : A Lot 6 Click Score: 11    End of Session Equipment Utilized During Treatment: Gait belt;Oxygen;Other (comment)(R UE abduction sling) Activity Tolerance: Patient limited by fatigue Patient left: in chair;with call bell/phone within reach;with chair alarm set;with nursing/sitter in room;with family/visitor present(B heels elevated via pillow) Nurse Communication: Mobility status;Precautions;Weight bearing status PT Visit Diagnosis: Unsteadiness on feet (R26.81);Other abnormalities of gait and mobility (R26.89);Muscle weakness (generalized) (M62.81);Difficulty in walking, not elsewhere classified (R26.2);Pain Pain - Right/Left: Right Pain - part of body: Shoulder     Time: 0109-3235 PT Time Calculation (min) (ACUTE ONLY): 39 min  Charges:  $Gait Training: 8-22 mins $Therapeutic Activity: 23-37 mins                    G CodesLeitha Bleak, PT 11/24/17, 11:59 AM [phone]

## 2017-11-25 DIAGNOSIS — M4800 Spinal stenosis, site unspecified: Secondary | ICD-10-CM | POA: Diagnosis not present

## 2017-11-25 DIAGNOSIS — R278 Other lack of coordination: Secondary | ICD-10-CM | POA: Diagnosis not present

## 2017-11-25 DIAGNOSIS — Z96611 Presence of right artificial shoulder joint: Secondary | ICD-10-CM | POA: Diagnosis not present

## 2017-11-25 DIAGNOSIS — Z7401 Bed confinement status: Secondary | ICD-10-CM | POA: Diagnosis not present

## 2017-11-25 DIAGNOSIS — Z8673 Personal history of transient ischemic attack (TIA), and cerebral infarction without residual deficits: Secondary | ICD-10-CM | POA: Diagnosis not present

## 2017-11-25 DIAGNOSIS — M1990 Unspecified osteoarthritis, unspecified site: Secondary | ICD-10-CM | POA: Diagnosis not present

## 2017-11-25 DIAGNOSIS — K5900 Constipation, unspecified: Secondary | ICD-10-CM | POA: Diagnosis not present

## 2017-11-25 DIAGNOSIS — E785 Hyperlipidemia, unspecified: Secondary | ICD-10-CM | POA: Diagnosis not present

## 2017-11-25 DIAGNOSIS — R262 Difficulty in walking, not elsewhere classified: Secondary | ICD-10-CM | POA: Diagnosis not present

## 2017-11-25 DIAGNOSIS — G629 Polyneuropathy, unspecified: Secondary | ICD-10-CM | POA: Diagnosis not present

## 2017-11-25 DIAGNOSIS — M6281 Muscle weakness (generalized): Secondary | ICD-10-CM | POA: Diagnosis not present

## 2017-11-25 DIAGNOSIS — K219 Gastro-esophageal reflux disease without esophagitis: Secondary | ICD-10-CM | POA: Diagnosis not present

## 2017-11-25 DIAGNOSIS — Z471 Aftercare following joint replacement surgery: Secondary | ICD-10-CM | POA: Diagnosis not present

## 2017-11-25 DIAGNOSIS — I2510 Atherosclerotic heart disease of native coronary artery without angina pectoris: Secondary | ICD-10-CM | POA: Diagnosis not present

## 2017-11-25 DIAGNOSIS — I10 Essential (primary) hypertension: Secondary | ICD-10-CM | POA: Diagnosis not present

## 2017-11-25 DIAGNOSIS — M48061 Spinal stenosis, lumbar region without neurogenic claudication: Secondary | ICD-10-CM | POA: Diagnosis not present

## 2017-11-25 DIAGNOSIS — G5621 Lesion of ulnar nerve, right upper limb: Secondary | ICD-10-CM | POA: Diagnosis not present

## 2017-11-25 DIAGNOSIS — M25511 Pain in right shoulder: Secondary | ICD-10-CM | POA: Diagnosis not present

## 2017-11-25 MED ORDER — BISACODYL 10 MG RE SUPP
10.0000 mg | Freq: Every day | RECTAL | Status: DC | PRN
  Administered 2017-11-25: 10 mg via RECTAL
  Filled 2017-11-25: qty 1

## 2017-11-25 MED ORDER — FLEET ENEMA 7-19 GM/118ML RE ENEM: 1.0000 | ENEMA | Freq: Every day | RECTAL | Status: DC | PRN

## 2017-11-25 MED ORDER — TRAMADOL HCL 50 MG PO TABS: 25.0000 mg | ORAL_TABLET | Freq: Four times a day (QID) | ORAL | 0 refills | Status: DC | PRN

## 2017-11-25 NOTE — Discharge Instructions (Signed)
Continue physical therapy.  Full weight bearing status  F/U with orthopedics in 1-2 weeks for right shoulder

## 2017-11-25 NOTE — Progress Notes (Signed)
Report called and given to [facility].

## 2017-11-25 NOTE — Progress Notes (Signed)
[facility] NAME: Robin Alexander    MR#:  [id number]  DATE OF BIRTH:  Jan 14, 1933  SUBJECTIVE:  CHIEF COMPLAINT:   Chief Complaint  Patient presents with  . Weakness  . Eye Problem  . Post-op Problem   Patient slept well.  Son at bedside.  Afebrile.   REVIEW OF SYSTEMS:    Review of Systems  Constitutional: Positive for malaise/fatigue. Negative for chills and fever.  HENT: Negative for sore throat.   Eyes: Negative for blurred vision, double vision and pain.  Respiratory: Negative for cough, hemoptysis, shortness of breath and wheezing.   Cardiovascular: Negative for chest pain, palpitations, orthopnea and leg swelling.  Gastrointestinal: Negative for abdominal pain, constipation, diarrhea, heartburn, nausea and vomiting.  Genitourinary: Negative for dysuria and hematuria.  Musculoskeletal: Positive for back pain and joint pain.  Skin: Negative for rash.  Neurological: Negative for sensory change, speech change, focal weakness and headaches.  Endo/Heme/Allergies: Does not bruise/bleed easily.  Psychiatric/Behavioral: Negative for depression. The patient is not nervous/anxious.     DRUG ALLERGIES:   Allergies  Allergen Reactions  . Adhesive [Tape]     Paper tape okay  . Codeine Itching    VITALS:  Blood pressure (!) 126/44, pulse 70, temperature 98.1 F (36.7 C), temperature source Oral, resp. rate 18, height 4\' 11"  (1.499 m), weight 72 kg (158 lb 11.7 oz), SpO2 98 %.  PHYSICAL EXAMINATION:   Physical Exam  GENERAL:  84 y.o.-year-old patient lying in the bed with no acute distress.  EYES: Pupils equal, round, reactive to light and accommodation. No scleral icterus. Extraocular muscles intact.  HEENT: Head atraumatic, normocephalic. Oropharynx and nasopharynx clear.  NECK:  Supple, no jugular venous distention. No thyroid enlargement, no tenderness.  LUNGS: Normal breath sounds bilaterally, no wheezing, rales,  rhonchi. No use of accessory muscles of respiration.  CARDIOVASCULAR: S1, S2 normal. No murmurs, rubs, or gallops.  ABDOMEN: Soft, nontender, nondistended. Bowel sounds present. No organomegaly or mass.  EXTREMITIES: No cyanosis, clubbing or edema b/l.    NEUROLOGIC: Cranial nerves II through XII are intact. Limited assessment of right upper extremity due to being in a sling. Lower extremities 4/5. PSYCHIATRIC: The patient is alert and oriented x 3.  SKIN: No obvious rash, lesion, or ulcer.   LABORATORY PANEL:   CBC Recent Labs  Lab 11/22/17 2010  WBC 9.0  HGB 8.9*  HCT 26.3*  PLT 151   ------------------------------------------------------------------------------------------------------------------ Chemistries  Recent Labs  Lab 11/22/17 2010  NA 134*  K 4.2  CL 99*  CO2 25  GLUCOSE 119*  BUN 38*  CREATININE 1.32*  CALCIUM 9.2  AST 40  ALT 8*  ALKPHOS 57  BILITOT 0.9   ------------------------------------------------------------------------------------------------------------------  Cardiac Enzymes Recent Labs  Lab 11/22/17 2010  TROPONINI <0.03   ------------------------------------------------------------------------------------------------------------------  RADIOLOGY:  Mr Lumbar Spine Wo Contrast  Result Date: 11/23/2017 CLINICAL DATA:  84 y/o  F; difficulty walking and weakness. EXAM: MRI LUMBAR SPINE WITHOUT CONTRAST TECHNIQUE: Multiplanar, multisequence MR imaging of the lumbar spine was performed. No intravenous contrast was administered. COMPARISON:  03/13/2016 lumbar spine MRI. FINDINGS: Segmentation:  Standard. Alignment:  L4-5 stable grade 1 anterolisthesis. Vertebrae: L3-4 right lateral vertebral body and pedicle fusion. Susceptibility artifact from fusion hardware partially obscures the vertebral bodies and right-sided neural foramen at that level. Mild opposing endplate edema at K5-3, likely degenerative. No evidence for acute fracture or discitis.  Stable chronic anterior compression deformity of L1 vertebral body.  Conus medullaris and cauda equina: Conus extends to the L2 level. Conus and cauda equina appear normal. Paraspinal and other soft tissues: Bilateral renal cysts measuring up to 5.2 cm in left lower pole. Disc levels: L1-2: Stable small disc bulge eccentric to the right with mild right foraminal stenosis. No canal stenosis. L2-3: Progression of disc bulge with endplate marginal osteophytes as well as facet and ligamentum flavum hypertrophy. Mild foraminal stenosis and moderate to severe canal stenosis. L3-4: Stable small disc bulge with mild facet hypertrophy and small central sequestered disc fragment at the L3 infra pedicular level. No significant foraminal stenosis. Mild canal stenosis. L4-5: Anterolisthesis and facet hypertrophy. Right neural foramen is partially obscured by susceptibility artifact. Patent neural foramen. Mild canal stenosis. L5-S1: No significant disc displacement, foraminal stenosis, or canal stenosis. IMPRESSION: 1. No acute osseous abnormality. 2. Stable L4-5 grade 1 anterolisthesis. Stable chronic L1 mild anterior compression deformity. L3-4 spinal fusion postsurgical changes without apparent complication. 3. Mild progression of lumbar spondylosis greatest at the L2-3 level where there is multifactorial moderate to severe canal stenosis. Electronically Signed   By: Kristine Garbe M.D.   On: 11/23/2017 14:59     ASSESSMENT AND PLAN:   *Bilateral lower extremity weakness.  Likely generalized deconditioning.  MRI of the brain showed old strokes.  Nothing acute.  MRI of the lumbar spine shows spinal stenosis.   Conservative management at this time.  If any worsening will need follow-up with spine surgery. Physical therapy evaluated patient.  Will need skilled nursing facility at discharge for ongoing physical therapy.  *Recent right shoulder surgery.  Continue occupational therapy and physical therapy.  Pain  controlled with tramadol. Patient is very sensitive to narcotic medications causing drowsiness and weakness.  *Hypertension.  Continue home medications  DVT prophylaxis with Lovenox  All the records are reviewed and case discussed with Care Management/Social Worker Management plans discussed with the patient, family and they are in agreement.  CODE STATUS: FULL CODE  DVT Prophylaxis: SCDs  TOTAL TIME TAKING CARE OF THIS PATIENT: 35 minutes.   POSSIBLE D/C IN 1-2 DAYS, DEPENDING ON CLINICAL CONDITION.  Neita Carp M.D on 11/25/2017 at 9:33 AM  Between 7am to 6pm - Pager - [phone]  After 6pm go to [email or web address] - password [email or web address] [facility]  Office  [phone]  CC: Primary care physician; Leone Haven, MD  Note: This dictation was prepared with Dragon dictation along with smaller phrase technology. Any transcriptional errors that result from this process are unintentional.

## 2017-11-25 NOTE — Clinical Social Work Placement (Signed)
   CLINICAL SOCIAL WORK PLACEMENT  NOTE  Date:  11/25/2017  Patient Details  Name: Robin Alexander MRN: [id number] Date of Birth: 04-13-1933  Clinical Social Work is seeking post-discharge placement for this patient at the [place] level of care (*CSW will initial, date and re-position this form in  chart as items are completed):  Yes   Patient/family provided with [name]'s list of facilities offering this level of care within the geographic area requested by the patient (or if unable, by the patient's family).  Yes   Patient/family informed of their freedom to choose among providers that offer the needed level of care, that participate in Medicare, Medicaid or managed care program needed by the patient, have an available bed and are willing to accept the patient.  Yes   Patient/family informed of [name]'s ownership interest in [name] and [name], as well as of the fact that they are under no obligation to receive care at these facilities.  PASRR submitted to EDS on 11/24/17     PASRR number received on 11/24/17     Existing PASRR number confirmed on       FL2 transmitted to all facilities in geographic area requested by pt/family on 11/24/17     FL2 transmitted to all facilities within larger geographic area on       Patient informed that his/her managed care company has contracts with or will negotiate with certain facilities, including the following:        Yes   Patient/family informed of bed offers received.  Patient chooses bed at [name] )     Physician recommends and patient chooses bed at      Patient to be transferred to [name] ) on 11/25/17.  Patient to be transferred to facility by [name] )     Patient family notified on 11/25/17 of transfer.  Name of family member notified:  (Patient's son [name] and husband [name] are at the bedside and aware of D/C  today.  )     PHYSICIAN       Additional Comment:    _______________________________________________ [name], [name], LCSW 11/25/2017, 10:07 AM

## 2017-11-25 NOTE — Progress Notes (Signed)
Patient is medically stable for D/C to [facility] today. Per [facility] admissions coordinator at [facility] authorization has been received and patient can come today to room 508. RN will call report and arrange EMS for transport. Clinical Education officer, museum (CSW) sent D/C orders to [facility] via [place]. Patient is aware of above. Patient's son [name] and husband [name] were at bedside and aware of above. Please reconsult if future social work needs arise. CSW signing off.   [employer], LCSW [phone]

## 2017-11-25 NOTE — Progress Notes (Signed)
Physical Therapy Treatment Patient Details Name: Robin Alexander MRN: [id number] DOB: 10-Nov-1932 Today's Date: 11/25/2017    History of Present Illness Pt is an 84 y.o. female presenting to hospital 11/22/17 with weakness, difficulty ambulating, and dragging of R foot; also reports of hallucinating seeing puffs of smoke.  Of note, pt s/p R reverse shoulder arthroplasty with biceps tenodesis 11/20/17 and discharged home 11/21/17.  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  PMH includes B knee pain, htn, chronic lumbar radiculopathy, B THA, back sx, h/o L ankle fx (pt reports August 2018).    PT Comments    Pt in bed, needing to void.  To edge of bed, right side, with +1 mod assist.  Once sitting able to maintain balance with supervision.  She stood with min a x 2 with B feet noted to be sliding on floor and narrow BOS.  Pt declined to put on her shoes with heel lift on L stating her feet were cold and she wanted to keep her socks on.  Pt was able to walk to/from bathroom with SPC and min guard/assist at times +2.  She takes very slow, short steps with minimal step height.  While pt is able to maintain balance walking forward she has difficulty with turns and backwards ambulation and had one LOB backing up to commode with required assist from staff to prevent fall.  She remains at a significant fall risk and should have +2 assist for gait for safety.     Follow Up Recommendations  SNF     Equipment Recommendations       Recommendations for Other Services       Precautions / Restrictions Precautions Precautions: Shoulder;Fall Shoulder Interventions: Shoulder abduction pillow;Shoulder sling/immobilizer Precaution Booklet Issued: No Restrictions Weight Bearing Restrictions: Yes RUE Weight Bearing: Non weight bearing    Mobility  Bed Mobility Overal bed mobility: Needs Assistance Bed Mobility: Supine to Sit     Supine to sit: Mod assist        Transfers Overall transfer level:  Needs assistance Equipment used: Straight cane Transfers: Sit to/from Stand Sit to Stand: Min assist         General transfer comment: feet sliding upon standing in socks.  Did not want to wear her shoes with lift "My feet are cold" Pt reported in prior encounter that gait is better with shoes vs socks.  Ambulation/Gait Ambulation/Gait assistance: Min assist;+2 safety/equipment Ambulation Distance (Feet): 12 Feet Assistive device: Straight cane Gait Pattern/deviations: Step-through pattern;Decreased step length - right;Decreased step length - left;Narrow base of support;Trunk flexed;Shuffle   Gait velocity interpretation: <1.8 ft/sec, indicative of risk for recurrent falls General Gait Details: to/from bathroom to void.    Stairs            Wheelchair Mobility    Modified Rankin (Stroke Patients Only)       Balance Overall balance assessment: Needs assistance;History of Falls Sitting-balance support: Feet supported;Single extremity supported Sitting balance-Leahy Scale: Good     Standing balance support: Single extremity supported Standing balance-Leahy Scale: Poor                              Cognition Arousal/Alertness: Awake/alert Behavior During Therapy: WFL for tasks assessed/performed Overall Cognitive Status: Within Functional Limits for tasks assessed  Exercises      General Comments        Pertinent Vitals/Pain Pain Assessment: No/denies pain    Home Living                      Prior Function            PT Goals (current goals can now be found in the care plan section) Progress towards PT goals: Progressing toward goals    Frequency    7X/week      PT Plan Current plan remains appropriate    Co-evaluation              AM-PAC PT "6 Clicks" Daily Activity  Outcome Measure  Difficulty turning over in bed (including adjusting bedclothes, sheets and  blankets)?: Unable Difficulty moving from lying on back to sitting on the side of the bed? : Unable Difficulty sitting down on and standing up from a chair with arms (e.g., wheelchair, bedside commode, etc,.)?: Unable Help needed moving to and from a bed to chair (including a wheelchair)?: A Little Help needed walking in hospital room?: A Lot Help needed climbing 3-5 steps with a railing? : A Lot 6 Click Score: 10    End of Session Equipment Utilized During Treatment: Gait belt;Oxygen;Other (comment) Activity Tolerance: Patient limited by fatigue Patient left: in chair;with call bell/phone within reach;with chair alarm set;with family/visitor present   Pain - Right/Left: Right Pain - part of body: Shoulder     Time: 3254-9826 PT Time Calculation (min) (ACUTE ONLY): 23 min  Charges:  $Therapeutic Exercise: 8-22 mins $Therapeutic Activity: 8-22 mins                    G Codes:       Chesley Noon, PTA 11/25/17, 9:24 AM

## 2017-11-25 NOTE — Progress Notes (Signed)
EMS called for transport. IV removed. Will assist pt with getting dressed and pack belongings.

## 2017-11-25 NOTE — Discharge Summary (Signed)
Robin Alexander at [facility] NAME: Robin Alexander    MR#:  [id number]  DATE OF BIRTH:  12/13/1932  DATE OF ADMISSION:  11/22/2017 ADMITTING PHYSICIAN: Harrie Foreman, MD  DATE OF DISCHARGE: No discharge date for patient encounter.  PRIMARY CARE PHYSICIAN: Leone Haven, MD   ADMISSION DIAGNOSIS:  Cerebellar infarction ([place]) [I63.9] Weakness [R53.1] Bronchitis [J40]  DISCHARGE DIAGNOSIS:  Active Problems:   Foot drop   SECONDARY DIAGNOSIS:   Past Medical History:  Diagnosis Date  . Abnormal CT scan, chest 03/20/2014  . Arthritis    shoulders  . Chicken pox   . Complication of anesthesia 2016   slow to wake up after anesthesia  . Dyspnea    uses inhaler when she gets bronchitis  . GERD (gastroesophageal reflux disease)   . Heart murmur   . High cholesterol   . HTN (hypertension)   . Pneumonia    spring 2015     ADMITTING HISTORY  Chief Complaint: Weakness HPI: The patient with past medical history of strokes and hypertension presents to the emergency department with weakness.  The patient has been having more trouble walking over the last few days than usual.  The patient reports that she has been dragging her right foot.  She also reported vision problems although her son states that she has been having hallucinations of seeing puffs of smoke.  Imaging of the brain in the emergency department revealed only old cerebellar infarcts however the patient could not ambulate which prompted the emergency department staff to call the hospitalist service for admission.    HOSPITAL COURSE:    *Bilateral lower extremity weakness.  Likely generalized deconditioning.  MRI of the brain showed old strokes.  Nothing acute.  MRI of the lumbar spine shows spinal stenosis.   Conservative management at this time.  If any worsening will need follow-up with spine surgery. Physical therapy evaluated patient.  Will need skilled nursing facility at  discharge for ongoing physical therapy.  *Recent right shoulder surgery.  Continue occupational therapy and physical therapy.  Pain controlled with tramadol. Patient is very sensitive to narcotic medications causing drowsiness and weakness.  *Hypertension.  Continue home medications  Patient stable for discharge to skilled nursing facility.   CONSULTS OBTAINED:  Treatment Team:  Alexis Goodell, MD  DRUG ALLERGIES:   Allergies  Allergen Reactions  . Adhesive [Tape]     Paper tape okay  . Codeine Itching    DISCHARGE MEDICATIONS:   Allergies as of 11/25/2017      Reactions   Adhesive [tape]    Paper tape okay   Codeine Itching      Medication List    STOP taking these medications   oxyCODONE 5 MG immediate release tablet Commonly known as:  Oxy IR/ROXICODONE     TAKE these medications   amLODipine-benazepril 5-20 MG capsule Commonly known as:  LOTREL Take 1 capsule by mouth  daily   aspirin 325 MG EC tablet Take 1 tablet (325 mg total) by mouth daily.   docusate sodium 100 MG capsule Commonly known as:  COLACE Take 100 mg by mouth daily.   Fish Oil 1000 MG Caps Take 1,000-2,000 mg by mouth daily. TAKE 2 CAPSULES IN THE MORNING & 1 CAPSULE AT NIGHT   Magnesium 250 MG Tabs Take 250 mg by mouth daily.   methocarbamol 500 MG tablet Commonly known as:  ROBAXIN Take 1 tablet (500 mg total) by mouth every 6 (six)  hours as needed for muscle spasms.   multivitamin with minerals Tabs tablet Take 1 tablet by mouth daily. CENTRUM SILVER   ondansetron 4 MG tablet Commonly known as:  ZOFRAN Take 1 tablet (4 mg total) by mouth every 6 (six) hours as needed for nausea.   ranitidine 150 MG tablet Commonly known as:  ZANTAC Take 150 mg by mouth 2 (two) times daily.   simvastatin 20 MG tablet Commonly known as:  ZOCOR TAKE 1 TABLET BY MOUTH  EVERY MORNING What changed:    how much to take  how to take this  when to take this   traMADol 50 MG  tablet Commonly known as:  ULTRAM Take 0.5 tablets (25 mg total) by mouth every 6 (six) hours as needed for severe pain. What changed:    how much to take  reasons to take this   Turmeric 500 MG Caps Take 500 mg by mouth daily.   Vitamin B12 3000 MCG Subl Take 6,000 mg by mouth daily.       Today   VITAL SIGNS:  Blood pressure (!) 126/44, pulse 70, temperature 98.1 F (36.7 C), temperature source Oral, resp. rate 18, height 4\' 11"  (1.499 m), weight 72 kg (158 lb 11.7 oz), SpO2 98 %.  I/O:    Intake/Output Summary (Last 24 hours) at 11/25/2017 0935 Last data filed at 11/24/2017 1300 Gross per 24 hour  Intake 240 ml  Output -  Net 240 ml    PHYSICAL EXAMINATION:  Physical Exam  GENERAL:  84 y.o.-year-old patient lying in the bed with no acute distress.  LUNGS: Normal breath sounds bilaterally, no wheezing, rales,rhonchi or crepitation. No use of accessory muscles of respiration.  CARDIOVASCULAR: S1, S2 normal. No murmurs, rubs, or gallops.  ABDOMEN: Soft, non-tender, non-distended. Bowel sounds present. No organomegaly or mass.  NEUROLOGIC: Moves all 4 extremities.  Motor strength 4/5 in lower extremities PSYCHIATRIC: The patient is alert and oriented x 3.  SKIN: No obvious rash, lesion, or ulcer.   Right shoulder sling  DATA REVIEW:   CBC Recent Labs  Lab 11/22/17 2010  WBC 9.0  HGB 8.9*  HCT 26.3*  PLT 151    Chemistries  Recent Labs  Lab 11/22/17 2010  NA 134*  K 4.2  CL 99*  CO2 25  GLUCOSE 119*  BUN 38*  CREATININE 1.32*  CALCIUM 9.2  AST 40  ALT 8*  ALKPHOS 35  BILITOT 0.9    Cardiac Enzymes Recent Labs  Lab 11/22/17 2010  TROPONINI <0.03    Microbiology Results  Results for orders placed or performed during the hospital encounter of 11/17/17  Urine culture     Status: Abnormal   Collection Time: 11/17/17  4:16 PM  Result Value Ref Range Status   Specimen Description   Final    URINE, CLEAN CATCH Performed at [facility], [address] [place], [place] [address]    Special Requests   Final    NONE Performed at [facility], [address] [place], [place] [address]    Culture >=100,000 COLONIES/mL ESCHERICHIA COLI (A)  Final   Report Status 11/19/2017 FINAL  Final   Organism ID, Bacteria ESCHERICHIA COLI (A)  Final      Susceptibility   Escherichia coli - MIC*    AMPICILLIN <=2 SENSITIVE Sensitive     CEFAZOLIN <=4 SENSITIVE Sensitive     CEFTRIAXONE <=1 SENSITIVE Sensitive     CIPROFLOXACIN <=0.25 SENSITIVE Sensitive  GENTAMICIN <=1 SENSITIVE Sensitive     IMIPENEM <=0.25 SENSITIVE Sensitive     NITROFURANTOIN <=16 SENSITIVE Sensitive     TRIMETH/SULFA <=20 SENSITIVE Sensitive     AMPICILLIN/SULBACTAM <=2 SENSITIVE Sensitive     PIP/TAZO <=4 SENSITIVE Sensitive     Extended ESBL NEGATIVE Sensitive     * >=100,000 COLONIES/mL ESCHERICHIA COLI  Surgical pcr screen     Status: None   Collection Time: 11/17/17  4:16 PM  Result Value Ref Range Status   MRSA, PCR NEGATIVE NEGATIVE Final   Staphylococcus aureus NEGATIVE NEGATIVE Final    Comment: (NOTE) The Xpert SA Assay (FDA approved for NASAL specimens in patients 61 years of age and older), is one component of a comprehensive surveillance program. It is not intended to diagnose infection nor to guide or monitor treatment. Performed at [facility], [address] [place], [place] [address]     RADIOLOGY:  Mr Lumbar Spine Wo Contrast  Result Date: 11/23/2017 CLINICAL DATA:  84 y/o  F; difficulty walking and weakness. EXAM: MRI LUMBAR SPINE WITHOUT CONTRAST TECHNIQUE: Multiplanar, multisequence MR imaging of the lumbar spine was performed. No intravenous contrast was administered. COMPARISON:  03/13/2016 lumbar spine MRI. FINDINGS: Segmentation:  Standard. Alignment:  L4-5 stable grade 1 anterolisthesis. Vertebrae: L3-4 right lateral vertebral body and pedicle fusion. Susceptibility artifact from fusion  hardware partially obscures the vertebral bodies and right-sided neural foramen at that level. Mild opposing endplate edema at S0-6, likely degenerative. No evidence for acute fracture or discitis. Stable chronic anterior compression deformity of L1 vertebral body. Conus medullaris and cauda equina: Conus extends to the L2 level. Conus and cauda equina appear normal. Paraspinal and other soft tissues: Bilateral renal cysts measuring up to 5.2 cm in left lower pole. Disc levels: L1-2: Stable small disc bulge eccentric to the right with mild right foraminal stenosis. No canal stenosis. L2-3: Progression of disc bulge with endplate marginal osteophytes as well as facet and ligamentum flavum hypertrophy. Mild foraminal stenosis and moderate to severe canal stenosis. L3-4: Stable small disc bulge with mild facet hypertrophy and small central sequestered disc fragment at the L3 infra pedicular level. No significant foraminal stenosis. Mild canal stenosis. L4-5: Anterolisthesis and facet hypertrophy. Right neural foramen is partially obscured by susceptibility artifact. Patent neural foramen. Mild canal stenosis. L5-S1: No significant disc displacement, foraminal stenosis, or canal stenosis. IMPRESSION: 1. No acute osseous abnormality. 2. Stable L4-5 grade 1 anterolisthesis. Stable chronic L1 mild anterior compression deformity. L3-4 spinal fusion postsurgical changes without apparent complication. 3. Mild progression of lumbar spondylosis greatest at the L2-3 level where there is multifactorial moderate to severe canal stenosis. Electronically Signed   By: Kristine Garbe M.D.   On: 11/23/2017 14:59    Follow up with PCP in 1 week.  Management plans discussed with the patient, family and they are in agreement.  CODE STATUS:  Code Status History    Date Active Date Inactive Code Status Order ID Comments User Context   11/20/2017 1231 11/21/2017 1529 Full Code [id number]  Leim Fabry, MD Inpatient    02/28/2015 2315 03/01/2015 2029 Full Code [id number]  Justice Britain, PA-C Inpatient    Advance Directive Documentation     Most Recent Value  Type of Advance Directive  Healthcare Power of Attorney, Living will  Pre-existing out of facility DNR order (yellow form or pink MOST form)  -  "MOST" Form in Place?  -      [place]  PATIENT ON DAY OF DISCHARGE: more than 30 minutes.   Leia Alf Lew Prout M.D on 11/25/2017 at 9:35 AM  Between 7am to 6pm - Pager - [phone]  After 6pm go to [email or web address] - password [email or web address] [facility]  Office  [phone]  CC: Primary care physician; Leone Haven, MD  Note: This dictation was prepared with Dragon dictation along with smaller phrase technology. Any transcriptional errors that result from this process are unintentional.

## 2017-11-26 DIAGNOSIS — M25511 Pain in right shoulder: Secondary | ICD-10-CM | POA: Diagnosis not present

## 2017-11-26 DIAGNOSIS — G5621 Lesion of ulnar nerve, right upper limb: Secondary | ICD-10-CM | POA: Diagnosis not present

## 2017-11-26 DIAGNOSIS — M48061 Spinal stenosis, lumbar region without neurogenic claudication: Secondary | ICD-10-CM | POA: Diagnosis not present

## 2017-12-01 DIAGNOSIS — M4800 Spinal stenosis, site unspecified: Secondary | ICD-10-CM | POA: Diagnosis not present

## 2017-12-01 DIAGNOSIS — I10 Essential (primary) hypertension: Secondary | ICD-10-CM | POA: Diagnosis not present

## 2017-12-01 DIAGNOSIS — M25511 Pain in right shoulder: Secondary | ICD-10-CM | POA: Diagnosis not present

## 2017-12-01 DIAGNOSIS — K5900 Constipation, unspecified: Secondary | ICD-10-CM | POA: Diagnosis not present

## 2017-12-02 DIAGNOSIS — M25511 Pain in right shoulder: Secondary | ICD-10-CM | POA: Diagnosis not present

## 2017-12-03 DIAGNOSIS — M25511 Pain in right shoulder: Secondary | ICD-10-CM | POA: Diagnosis not present

## 2017-12-03 DIAGNOSIS — K5900 Constipation, unspecified: Secondary | ICD-10-CM | POA: Diagnosis not present

## 2017-12-03 DIAGNOSIS — M4800 Spinal stenosis, site unspecified: Secondary | ICD-10-CM | POA: Diagnosis not present

## 2017-12-03 DIAGNOSIS — I10 Essential (primary) hypertension: Secondary | ICD-10-CM | POA: Diagnosis not present

## 2017-12-04 ENCOUNTER — Ambulatory Visit: Payer: Medicare Other | Admitting: Cardiovascular Disease

## 2017-12-08 DIAGNOSIS — M6281 Muscle weakness (generalized): Secondary | ICD-10-CM | POA: Diagnosis not present

## 2017-12-08 DIAGNOSIS — Z96611 Presence of right artificial shoulder joint: Secondary | ICD-10-CM | POA: Diagnosis not present

## 2017-12-08 DIAGNOSIS — M25611 Stiffness of right shoulder, not elsewhere classified: Secondary | ICD-10-CM | POA: Diagnosis not present

## 2017-12-15 DIAGNOSIS — M25611 Stiffness of right shoulder, not elsewhere classified: Secondary | ICD-10-CM | POA: Diagnosis not present

## 2017-12-15 DIAGNOSIS — Z96611 Presence of right artificial shoulder joint: Secondary | ICD-10-CM | POA: Diagnosis not present

## 2017-12-23 DIAGNOSIS — Z96611 Presence of right artificial shoulder joint: Secondary | ICD-10-CM | POA: Diagnosis not present

## 2017-12-23 DIAGNOSIS — M25611 Stiffness of right shoulder, not elsewhere classified: Secondary | ICD-10-CM | POA: Diagnosis not present

## 2017-12-30 DIAGNOSIS — M25511 Pain in right shoulder: Secondary | ICD-10-CM | POA: Diagnosis not present

## 2017-12-30 DIAGNOSIS — Z96611 Presence of right artificial shoulder joint: Secondary | ICD-10-CM | POA: Diagnosis not present

## 2017-12-30 DIAGNOSIS — M25611 Stiffness of right shoulder, not elsewhere classified: Secondary | ICD-10-CM | POA: Diagnosis not present

## 2018-01-04 DIAGNOSIS — M25611 Stiffness of right shoulder, not elsewhere classified: Secondary | ICD-10-CM | POA: Diagnosis not present

## 2018-01-04 DIAGNOSIS — Z96611 Presence of right artificial shoulder joint: Secondary | ICD-10-CM | POA: Diagnosis not present

## 2018-01-06 DIAGNOSIS — M25611 Stiffness of right shoulder, not elsewhere classified: Secondary | ICD-10-CM | POA: Diagnosis not present

## 2018-01-06 DIAGNOSIS — Z96611 Presence of right artificial shoulder joint: Secondary | ICD-10-CM | POA: Diagnosis not present

## 2018-01-11 DIAGNOSIS — M25611 Stiffness of right shoulder, not elsewhere classified: Secondary | ICD-10-CM | POA: Diagnosis not present

## 2018-01-11 DIAGNOSIS — Z96611 Presence of right artificial shoulder joint: Secondary | ICD-10-CM | POA: Diagnosis not present

## 2018-01-13 DIAGNOSIS — Z96611 Presence of right artificial shoulder joint: Secondary | ICD-10-CM | POA: Diagnosis not present

## 2018-01-13 DIAGNOSIS — M25611 Stiffness of right shoulder, not elsewhere classified: Secondary | ICD-10-CM | POA: Diagnosis not present

## 2018-01-18 DIAGNOSIS — Z96611 Presence of right artificial shoulder joint: Secondary | ICD-10-CM | POA: Diagnosis not present

## 2018-01-18 DIAGNOSIS — M25611 Stiffness of right shoulder, not elsewhere classified: Secondary | ICD-10-CM | POA: Diagnosis not present

## 2018-01-26 DIAGNOSIS — M25611 Stiffness of right shoulder, not elsewhere classified: Secondary | ICD-10-CM | POA: Diagnosis not present

## 2018-01-26 DIAGNOSIS — Z96611 Presence of right artificial shoulder joint: Secondary | ICD-10-CM | POA: Diagnosis not present

## 2018-01-27 ENCOUNTER — Other Ambulatory Visit: Payer: Self-pay | Admitting: Family Medicine

## 2018-01-28 ENCOUNTER — Other Ambulatory Visit: Payer: Medicare Other

## 2018-01-28 DIAGNOSIS — Z96611 Presence of right artificial shoulder joint: Secondary | ICD-10-CM | POA: Diagnosis not present

## 2018-01-28 DIAGNOSIS — M25611 Stiffness of right shoulder, not elsewhere classified: Secondary | ICD-10-CM | POA: Diagnosis not present

## 2018-02-01 ENCOUNTER — Ambulatory Visit
Admission: RE | Admit: 2018-02-01 | Discharge: 2018-02-01 | Disposition: A | Discharge status: Home / Self Care | Payer: Medicare Other | Source: Ambulatory Visit | Attending: Family Medicine | Admitting: Family Medicine

## 2018-02-01 DIAGNOSIS — M85832 Other specified disorders of bone density and structure, left forearm: Secondary | ICD-10-CM | POA: Diagnosis not present

## 2018-02-01 DIAGNOSIS — Z780 Asymptomatic menopausal state: Secondary | ICD-10-CM | POA: Insufficient documentation

## 2018-02-02 DIAGNOSIS — Z96611 Presence of right artificial shoulder joint: Secondary | ICD-10-CM | POA: Diagnosis not present

## 2018-02-03 DIAGNOSIS — M25511 Pain in right shoulder: Secondary | ICD-10-CM | POA: Diagnosis not present

## 2018-02-04 DIAGNOSIS — M25611 Stiffness of right shoulder, not elsewhere classified: Secondary | ICD-10-CM | POA: Diagnosis not present

## 2018-02-04 DIAGNOSIS — Z96611 Presence of right artificial shoulder joint: Secondary | ICD-10-CM | POA: Diagnosis not present

## 2018-02-09 DIAGNOSIS — Z96611 Presence of right artificial shoulder joint: Secondary | ICD-10-CM | POA: Diagnosis not present

## 2018-02-11 ENCOUNTER — Other Ambulatory Visit: Payer: Self-pay | Admitting: Family Medicine

## 2018-02-15 ENCOUNTER — Ambulatory Visit (INDEPENDENT_AMBULATORY_CARE_PROVIDER_SITE_OTHER): Payer: Medicare Other | Admitting: Family

## 2018-02-15 ENCOUNTER — Encounter: Payer: Self-pay | Admitting: Family

## 2018-02-15 VITALS — BP 138/54 | HR 83 | Temp 98.1°F | Resp 15 | Wt 146.1 lb

## 2018-02-15 DIAGNOSIS — L299 Pruritus, unspecified: Secondary | ICD-10-CM | POA: Diagnosis not present

## 2018-02-15 MED ORDER — TRIAMCINOLONE ACETONIDE 0.025 % EX CREA: 1.0000 "application " | TOPICAL_CREAM | Freq: Two times a day (BID) | CUTANEOUS | 1 refills | Status: DC

## 2018-02-15 MED ORDER — LORATADINE 10 MG PO TABS: 10.0000 mg | ORAL_TABLET | Freq: Every day | ORAL | 1 refills | Status: DC

## 2018-02-15 NOTE — Patient Instructions (Signed)
Hives of unknown cause  May trial atarax- this is sedating H1 antihistamine- you may use this at bedtime , and may find it helpful, especially when combined with a nonsedating H1 antihistamine ( zyrtec or claritin) during the day. If you take the atarax- DO NOT also take Benadryl as these medications are in the same drug class and VERY sedating.   As discussed, source of itching is unclear .   Trial of claritin, zantac and topical steriod.   Please let me know if doesn't improve   Tips on Aggravating factors -    These include:  ?Physical factors -  As an example, heat (hot showers, extreme humidity) is a common trigger for many , and tight clothing or straps can also aggravate symptoms.   ?Anti-inflammatory medications - Nonsteroidal anti-inflammatory drugs (NSAIDs) worsen symptoms   ?Stress - Patients often report more severe symptoms during periods of physical or psychologic stress   ?Variations in dietary habits and alcohol - Although food allergy is a rare cause of, some patients will report that variations in diet, particularly rich meals or spicy foods, will aggravate symptoms. Alcohol also aggravates symptoms in some.  If hives do not improve, please let me know and remember :   Histamine 1 blocker - Either benadryl every 8 hours (may be different dosing/duration depending on formulation, please follow directions on bottle)                                         OR  claritin or zyrtec once daily  Histamine 2 blocker- Pepcid AC or Zantac twice a day

## 2018-02-15 NOTE — Progress Notes (Signed)
Subjective:    Patient ID: Robin Alexander, female    DOB: 1932-12-04, 84 y.o.   MRN: [id number]  CC: Robin Alexander is a 84 y.o. female who presents today for an acute visit.    HPI: CC: 'itching all over' x one week, worsening. Started on low back and then spread to front.  Tried the tramadol last night to help her sleep. No visual rash. No trouble swallowing, SOB.  Endorses sneezing 'my head off.' Took benadryl couple of night with no relief. Using anti itch lotion.   Reports she changed soaps one week ago to see if contributory, no change.  Thought allergic to grapes, tomatoes.  On zantac.   Recent shoulder replacement surgery 10/2017.     Hospital admission for bilateral LE weakness after right shoulder surgery. MRI lumbar - spinal stenosis, L4-5. MRI brain- old infarct, no acute abnormality. Told she was overmedicated. Cannot take oxycodone , can take tramadol.   HISTORY:  Past Medical History:  Diagnosis Date  . Abnormal CT scan, chest 03/20/2014  . Arthritis    shoulders  . Chicken pox   . Complication of anesthesia 2016   slow to wake up after anesthesia  . Dyspnea    uses inhaler when she gets bronchitis  . GERD (gastroesophageal reflux disease)   . Heart murmur   . High cholesterol   . HTN (hypertension)   . Pneumonia    spring 2015   Past Surgical History:  Procedure Laterality Date  . ABDOMINAL HYSTERECTOMY     in 50's  . ANTERIOR LAT LUMBAR FUSION Right 02/28/2015   Procedure: ANTERIOR LATERAL LUMBAR FUSION 1 LEVEL;  Surgeon: Phylliss Bob, MD;  Location: [facility];  Service: Orthopedics;  Laterality: Right;  Right sided lumbar 3-4 lateral interbody fusion  . APPENDECTOMY  1950  . BACK SURGERY     rod in back  . BICEPT TENODESIS Right 11/20/2017   Procedure: BICEPS TENODESIS;  Surgeon: Leim Fabry, MD;  Location: [facility];  Service: Orthopedics;  Laterality: Right;  . CATARACT EXTRACTION W/ INTRAOCULAR LENS  IMPLANT, BILATERAL Bilateral 2011  . CESAREAN  SECTION     2  . JOINT REPLACEMENT    . REVERSE SHOULDER ARTHROPLASTY Right 11/20/2017   Procedure: REVERSE SHOULDER ARTHROPLASTY;  Surgeon: Leim Fabry, MD;  Location: [facility];  Service: Orthopedics;  Laterality: Right;  . ROTATOR CUFF REPAIR Right    right shoulder   10 YRS  . TOTAL HIP ARTHROPLASTY Bilateral 2005, 2006   Bilateral, [place]   Family History  Problem Relation Age of Onset  . Arthritis Mother   . Heart disease Mother   . Arthritis Father   . Stroke Brother   . Heart disease Brother   . Cancer Neg Hx     Allergies: Adhesive [tape] and Codeine Current Outpatient Medications on File Prior to Visit  Medication Sig Dispense Refill  . amLODipine-benazepril (LOTREL) 5-20 MG capsule TAKE 1 CAPSULE BY MOUTH  DAILY 90 capsule 3  . aspirin EC 325 MG EC tablet Take 1 tablet (325 mg total) by mouth daily. 14 tablet 0  . Cyanocobalamin (VITAMIN B12) 3000 MCG SUBL Take 6,000 mg by mouth daily.    Marland Kitchen docusate sodium (COLACE) 100 MG capsule Take 100 mg by mouth daily.    . Magnesium 250 MG TABS Take 250 mg by mouth daily.    . methocarbamol (ROBAXIN) 500 MG tablet Take 1 tablet (500 mg total) by mouth every 6 (six) hours as  needed for muscle spasms. 30 tablet 0  . Multiple Vitamin (MULTIVITAMIN WITH MINERALS) TABS tablet Take 1 tablet by mouth daily. CENTRUM SILVER    . Omega-3 Fatty Acids (FISH OIL) 1000 MG CAPS Take 1,000-2,000 mg by mouth daily. TAKE 2 CAPSULES IN THE MORNING & 1 CAPSULE AT NIGHT    . ondansetron (ZOFRAN) 4 MG tablet Take 1 tablet (4 mg total) by mouth every 6 (six) hours as needed for nausea. 20 tablet 0  . ranitidine (ZANTAC) 150 MG tablet Take 150 mg by mouth 2 (two) times daily.    . simvastatin (ZOCOR) 20 MG tablet TAKE 1 TABLET BY MOUTH  EVERY MORNING 90 tablet 3  . traMADol (ULTRAM) 50 MG tablet Take 0.5 tablets (25 mg total) by mouth every 6 (six) hours as needed for severe pain. 10 tablet 0  . Turmeric 500 MG CAPS Take 500 mg by mouth daily.       Current Facility-Administered Medications on File Prior to Visit  Medication Dose Route Frequency Provider Last Rate Last Dose  . albuterol (PROVENTIL) (2.5 MG/3ML) 0.083% nebulizer solution 2.5 mg  2.5 mg Nebulization Once Burnard Hawthorne, FNP        Social History   Tobacco Use  . Smoking status: Former Smoker    Packs/day: 0.75    Years: 20.00    Pack years: 15.00    Types: Cigarettes    Last attempt to quit: 09/02/1983    Years since quitting: 34.4  . Smokeless tobacco: Never Used  Substance Use Topics  . Alcohol use: No    Alcohol/week: 0.0 oz    Comment: Occasional wine  . Drug use: No    Review of Systems  Constitutional: Negative for chills and fever.  HENT: Negative for trouble swallowing and voice change.   Respiratory: Negative for cough, chest tightness and shortness of breath.   Cardiovascular: Negative for chest pain and palpitations.  Gastrointestinal: Negative for nausea and vomiting.  Skin: Negative for pallor, rash and wound.      Objective:    BP (!) 138/54 (BP Location: Left Arm, Patient Position: Sitting, Cuff Size: Normal)   Pulse 83   Temp 98.1 F (36.7 C) (Oral)   Resp 15   Wt 146 lb 2 oz (66.3 kg)   SpO2 96%   BMI 29.51 kg/m    Physical Exam  Constitutional: She appears well-developed and well-nourished.  HENT:  Head: Normocephalic and atraumatic.  Right Ear: Hearing, tympanic membrane, external ear and ear canal normal. No drainage, swelling or tenderness. No foreign bodies. Tympanic membrane is not erythematous and not bulging. No middle ear effusion. No decreased hearing is noted.  Left Ear: Hearing, tympanic membrane, external ear and ear canal normal. No drainage, swelling or tenderness. No foreign bodies. Tympanic membrane is not erythematous and not bulging.  No middle ear effusion. No decreased hearing is noted.  Nose: Nose normal. No rhinorrhea. Right sinus exhibits no maxillary sinus tenderness and no frontal sinus tenderness.  Left sinus exhibits no maxillary sinus tenderness and no frontal sinus tenderness.  Mouth/Throat: Uvula is midline, oropharynx is clear and moist and mucous membranes are normal. No oropharyngeal exudate, posterior oropharyngeal edema, posterior oropharyngeal erythema or tonsillar abscesses.  Eyes: Conjunctivae are normal.  Cardiovascular: Regular rhythm, normal heart sounds and normal pulses.  Pulmonary/Chest: Effort normal and breath sounds normal. She has no wheezes. She has no rhonchi. She has no rales.  Lymphadenopathy:       Head (right side): No  submental, no submandibular, no tonsillar, no preauricular, no posterior auricular and no occipital adenopathy present.       Head (left side): No submental, no submandibular, no tonsillar, no preauricular, no posterior auricular and no occipital adenopathy present.    She has no cervical adenopathy.  Neurological: She is alert.  Skin: Skin is warm and dry. No rash noted.  Psychiatric: She has a normal mood and affect. Her speech is normal and behavior is normal. Thought content normal.  Vitals reviewed.      Assessment & Plan:  1. Itching Patient well-appearing.  No acute respiratory distress.  No rash on exam.  Benign HEENT exam.  In this context, we discussed with patient that etiology of "itching" is not specific at this time.  We will go ahead and treat her prophylactically.  She is currently on Zantac.  Asked her to start Claritin.  Also will give her topical mid potency corticosteroid.  Patient let me know how she is doing.  We discussed  If no improvement, may possibly consider prednisone taper.  - triamcinolone (KENALOG) 0.025 % cream; Apply 1 application topically 2 (two) times daily.  Dispense: 80 g; Refill: 1 - loratadine (CLARITIN) 10 MG tablet; Take 1 tablet (10 mg total) by mouth daily.  Dispense: 30 tablet; Refill: 1     I am having Donald Pore start on triamcinolone and loratadine. I am also having her maintain her  Turmeric, Fish Oil, multivitamin with minerals, Magnesium, Vitamin B12, docusate sodium, ranitidine, aspirin, methocarbamol, ondansetron, traMADol, amLODipine-benazepril, and simvastatin. We will continue to administer albuterol.   Meds ordered this encounter  Medications  . triamcinolone (KENALOG) 0.025 % cream    Sig: Apply 1 application topically 2 (two) times daily.    Dispense:  80 g    Refill:  1    Order Specific Question:   Supervising Provider    Answer:   Derrel Nip, TERESA L [[id number]]  . loratadine (CLARITIN) 10 MG tablet    Sig: Take 1 tablet (10 mg total) by mouth daily.    Dispense:  30 tablet    Refill:  1    Order Specific Question:   Supervising Provider    Answer:   Crecencio Mc [[id number]]    Return precautions given.   Risks, benefits, and alternatives of the medications and treatment plan prescribed today were discussed, and patient expressed understanding.   Education regarding symptom management and diagnosis given to patient on AVS.  Continue to follow with Leone Haven, MD for routine health maintenance.   Donald Pore and I agreed with plan.   Mable Paris, FNP

## 2018-02-16 DIAGNOSIS — M25611 Stiffness of right shoulder, not elsewhere classified: Secondary | ICD-10-CM | POA: Diagnosis not present

## 2018-02-16 DIAGNOSIS — Z96611 Presence of right artificial shoulder joint: Secondary | ICD-10-CM | POA: Diagnosis not present

## 2018-02-22 DIAGNOSIS — M25611 Stiffness of right shoulder, not elsewhere classified: Secondary | ICD-10-CM | POA: Diagnosis not present

## 2018-02-22 DIAGNOSIS — Z96611 Presence of right artificial shoulder joint: Secondary | ICD-10-CM | POA: Diagnosis not present

## 2018-02-26 DIAGNOSIS — R809 Proteinuria, unspecified: Secondary | ICD-10-CM | POA: Diagnosis not present

## 2018-02-26 DIAGNOSIS — D649 Anemia, unspecified: Secondary | ICD-10-CM | POA: Diagnosis not present

## 2018-02-26 DIAGNOSIS — I10 Essential (primary) hypertension: Secondary | ICD-10-CM | POA: Diagnosis not present

## 2018-02-26 DIAGNOSIS — N183 Chronic kidney disease, stage 3 (moderate): Secondary | ICD-10-CM | POA: Diagnosis not present

## 2018-03-01 DIAGNOSIS — Z96611 Presence of right artificial shoulder joint: Secondary | ICD-10-CM | POA: Diagnosis not present

## 2018-03-02 ENCOUNTER — Inpatient Hospital Stay
Admission: EM | Admit: 2018-03-02 | Discharge: 2018-03-03 | DRG: 811 | Disposition: A | Discharge status: Home / Self Care | Payer: Medicare Other | Attending: Internal Medicine | Admitting: Internal Medicine

## 2018-03-02 ENCOUNTER — Encounter: Payer: Self-pay | Admitting: Emergency Medicine

## 2018-03-02 ENCOUNTER — Other Ambulatory Visit: Payer: Self-pay

## 2018-03-02 ENCOUNTER — Telehealth: Payer: Self-pay | Admitting: Family Medicine

## 2018-03-02 DIAGNOSIS — D500 Iron deficiency anemia secondary to blood loss (chronic): Principal | ICD-10-CM | POA: Diagnosis present

## 2018-03-02 DIAGNOSIS — Z7982 Long term (current) use of aspirin: Secondary | ICD-10-CM

## 2018-03-02 DIAGNOSIS — Z79899 Other long term (current) drug therapy: Secondary | ICD-10-CM

## 2018-03-02 DIAGNOSIS — Z96611 Presence of right artificial shoulder joint: Secondary | ICD-10-CM | POA: Diagnosis not present

## 2018-03-02 DIAGNOSIS — Z96643 Presence of artificial hip joint, bilateral: Secondary | ICD-10-CM | POA: Diagnosis present

## 2018-03-02 DIAGNOSIS — K5750 Diverticulosis of both small and large intestine without perforation or abscess without bleeding: Secondary | ICD-10-CM | POA: Diagnosis present

## 2018-03-02 DIAGNOSIS — N182 Chronic kidney disease, stage 2 (mild): Secondary | ICD-10-CM | POA: Diagnosis present

## 2018-03-02 DIAGNOSIS — D649 Anemia, unspecified: Secondary | ICD-10-CM | POA: Diagnosis not present

## 2018-03-02 DIAGNOSIS — Z961 Presence of intraocular lens: Secondary | ICD-10-CM | POA: Diagnosis not present

## 2018-03-02 DIAGNOSIS — K5520 Angiodysplasia of colon without hemorrhage: Secondary | ICD-10-CM | POA: Diagnosis not present

## 2018-03-02 DIAGNOSIS — Z87891 Personal history of nicotine dependence: Secondary | ICD-10-CM | POA: Diagnosis not present

## 2018-03-02 DIAGNOSIS — Z9842 Cataract extraction status, left eye: Secondary | ICD-10-CM

## 2018-03-02 DIAGNOSIS — K2981 Duodenitis with bleeding: Secondary | ICD-10-CM | POA: Diagnosis not present

## 2018-03-02 DIAGNOSIS — Z8249 Family history of ischemic heart disease and other diseases of the circulatory system: Secondary | ICD-10-CM

## 2018-03-02 DIAGNOSIS — E785 Hyperlipidemia, unspecified: Secondary | ICD-10-CM | POA: Diagnosis present

## 2018-03-02 DIAGNOSIS — K219 Gastro-esophageal reflux disease without esophagitis: Secondary | ICD-10-CM | POA: Diagnosis present

## 2018-03-02 DIAGNOSIS — K264 Chronic or unspecified duodenal ulcer with hemorrhage: Secondary | ICD-10-CM | POA: Diagnosis present

## 2018-03-02 DIAGNOSIS — Z981 Arthrodesis status: Secondary | ICD-10-CM

## 2018-03-02 DIAGNOSIS — Z91048 Other nonmedicinal substance allergy status: Secondary | ICD-10-CM

## 2018-03-02 DIAGNOSIS — I129 Hypertensive chronic kidney disease with stage 1 through stage 4 chronic kidney disease, or unspecified chronic kidney disease: Secondary | ICD-10-CM | POA: Diagnosis present

## 2018-03-02 DIAGNOSIS — Z885 Allergy status to narcotic agent status: Secondary | ICD-10-CM | POA: Diagnosis not present

## 2018-03-02 DIAGNOSIS — K922 Gastrointestinal hemorrhage, unspecified: Secondary | ICD-10-CM | POA: Diagnosis not present

## 2018-03-02 DIAGNOSIS — Z9841 Cataract extraction status, right eye: Secondary | ICD-10-CM

## 2018-03-02 DIAGNOSIS — Z90710 Acquired absence of both cervix and uterus: Secondary | ICD-10-CM | POA: Diagnosis not present

## 2018-03-02 DIAGNOSIS — D509 Iron deficiency anemia, unspecified: Secondary | ICD-10-CM | POA: Diagnosis not present

## 2018-03-02 DIAGNOSIS — I10 Essential (primary) hypertension: Secondary | ICD-10-CM | POA: Diagnosis not present

## 2018-03-02 LAB — IRON AND TIBC
Iron: 19 ug/dL — ABNORMAL LOW (ref 28–170)
Saturation Ratios: 4 % — ABNORMAL LOW (ref 10.4–31.8)
TIBC: 443 ug/dL (ref 250–450)
UIBC: 424 ug/dL

## 2018-03-02 LAB — CBC
HCT: 21.8 % — ABNORMAL LOW (ref 35.0–47.0)
Hemoglobin: 6.9 g/dL — ABNORMAL LOW (ref 12.0–16.0)
MCH: 25.3 pg — ABNORMAL LOW (ref 26.0–34.0)
MCHC: 31.9 g/dL — AB (ref 32.0–36.0)
MCV: 79.2 fL — ABNORMAL LOW (ref 80.0–100.0)
Platelets: 277 10*3/uL (ref 150–440)
RBC: 2.75 MIL/uL — ABNORMAL LOW (ref 3.80–5.20)
RDW: 16.8 % — ABNORMAL HIGH (ref 11.5–14.5)
WBC: 7.4 10*3/uL (ref 3.6–11.0)

## 2018-03-02 LAB — COMPREHENSIVE METABOLIC PANEL
ALBUMIN: 4.2 g/dL (ref 3.5–5.0)
ALT: 17 U/L (ref 0–44)
ANION GAP: 11 (ref 5–15)
AST: 22 U/L (ref 15–41)
Alkaline Phosphatase: 74 U/L (ref 38–126)
BILIRUBIN TOTAL: 0.7 mg/dL (ref 0.3–1.2)
BUN: 34 mg/dL — AB (ref 8–23)
CALCIUM: 9.5 mg/dL (ref 8.9–10.3)
CO2: 24 mmol/L (ref 22–32)
Chloride: 107 mmol/L (ref 98–111)
Creatinine, Ser: 1.16 mg/dL — ABNORMAL HIGH (ref 0.44–1.00)
GFR calc Af Amer: 49 mL/min — ABNORMAL LOW (ref >60)
GFR, EST NON AFRICAN AMERICAN: 42 mL/min — AB (ref >60)
GLUCOSE: 121 mg/dL — AB (ref 70–99)
Potassium: 4.5 mmol/L (ref 3.5–5.1)
Sodium: 142 mmol/L (ref 135–145)
TOTAL PROTEIN: 7.9 g/dL (ref 6.5–8.1)

## 2018-03-02 LAB — FOLATE: FOLATE: 82 ng/mL (ref >5.9)

## 2018-03-02 LAB — PREPARE RBC (CROSSMATCH)

## 2018-03-02 LAB — FERRITIN: Ferritin: 10 ng/mL — ABNORMAL LOW (ref 11–307)

## 2018-03-02 LAB — VITAMIN B12: VITAMIN B 12: 2699 pg/mL — AB (ref 180–914)

## 2018-03-02 MED ORDER — FAMOTIDINE 20 MG PO TABS
20.0000 mg | ORAL_TABLET | Freq: Every day | ORAL | Status: DC
  Administered 2018-03-03: 20 mg via ORAL
  Filled 2018-03-02: qty 1

## 2018-03-02 MED ORDER — AMLODIPINE BESYLATE 5 MG PO TABS
5.0000 mg | ORAL_TABLET | Freq: Every day | ORAL | Status: DC
  Administered 2018-03-03: 5 mg via ORAL
  Filled 2018-03-02: qty 1

## 2018-03-02 MED ORDER — ACETAMINOPHEN 650 MG RE SUPP: 650.0000 mg | Freq: Four times a day (QID) | RECTAL | Status: DC | PRN

## 2018-03-02 MED ORDER — ONDANSETRON HCL 4 MG/2ML IJ SOLN: 4.0000 mg | Freq: Four times a day (QID) | INTRAMUSCULAR | Status: DC | PRN

## 2018-03-02 MED ORDER — AMLODIPINE BESY-BENAZEPRIL HCL 5-20 MG PO CAPS: 1.0000 | ORAL_CAPSULE | Freq: Every day | ORAL | Status: DC

## 2018-03-02 MED ORDER — TRAZODONE HCL 50 MG PO TABS: 25.0000 mg | ORAL_TABLET | Freq: Every evening | ORAL | Status: DC | PRN

## 2018-03-02 MED ORDER — KETOROLAC TROMETHAMINE 30 MG/ML IJ SOLN: 15.0000 mg | Freq: Once | INTRAMUSCULAR | Status: DC

## 2018-03-02 MED ORDER — PEG 3350-KCL-NA BICARB-NACL 420 G PO SOLR
4000.0000 mL | Freq: Once | ORAL | Status: AC
  Administered 2018-03-02: 4000 mL via ORAL
  Filled 2018-03-02: qty 4000

## 2018-03-02 MED ORDER — DOCUSATE SODIUM 100 MG PO CAPS: 100.0000 mg | ORAL_CAPSULE | Freq: Every day | ORAL | Status: DC

## 2018-03-02 MED ORDER — BISACODYL 5 MG PO TBEC: 5.0000 mg | DELAYED_RELEASE_TABLET | Freq: Every day | ORAL | Status: DC | PRN

## 2018-03-02 MED ORDER — BENAZEPRIL HCL 20 MG PO TABS
20.0000 mg | ORAL_TABLET | Freq: Every day | ORAL | Status: DC
  Administered 2018-03-03: 10:00:00 20 mg via ORAL
  Filled 2018-03-02: qty 1

## 2018-03-02 MED ORDER — SODIUM CHLORIDE 0.9 % IV SOLN
INTRAVENOUS | Status: DC
  Administered 2018-03-03 (×2): via INTRAVENOUS

## 2018-03-02 MED ORDER — TRAMADOL HCL 50 MG PO TABS
25.0000 mg | ORAL_TABLET | Freq: Four times a day (QID) | ORAL | Status: DC | PRN
  Administered 2018-03-02: 25 mg via ORAL
  Filled 2018-03-02: qty 1

## 2018-03-02 MED ORDER — ONDANSETRON HCL 4 MG PO TABS: 4.0000 mg | ORAL_TABLET | Freq: Four times a day (QID) | ORAL | Status: DC | PRN

## 2018-03-02 MED ORDER — SODIUM CHLORIDE 0.9 % IV SOLN
10.0000 mL/h | Freq: Once | INTRAVENOUS | Status: AC
  Administered 2018-03-02: 17:00:00 10 mL/h via INTRAVENOUS

## 2018-03-02 MED ORDER — ACETAMINOPHEN 325 MG PO TABS
650.0000 mg | ORAL_TABLET | Freq: Four times a day (QID) | ORAL | Status: DC | PRN
  Administered 2018-03-02: 650 mg via ORAL
  Filled 2018-03-02: qty 2

## 2018-03-02 MED ORDER — DOCUSATE SODIUM 100 MG PO CAPS: 100.0000 mg | ORAL_CAPSULE | Freq: Two times a day (BID) | ORAL | Status: DC

## 2018-03-02 NOTE — Telephone Encounter (Signed)
She needs to go to the emergency department given that her hemoglobin is 6.3. She will need a transfusion. Please call the patient to advise her of this. She should have someone drive her if she is asymptomatic or if she is having symptoms such as chest pain, shortness of breath, light headedness, or palpitations she should contact EMS to transport her.

## 2018-03-02 NOTE — ED Triage Notes (Signed)
Pt to ED c/o low hemoglobin, states had blood work done on Friday and called by Dr. Josephina Gip today to come to ED.  Hemoglobin 6.3.  States has had blood transfusion in past.  States "not a lot of energy", color appropriate, denies blood in stool, denies pain or SOB.  Chest rise even and unlabored, speaking in complete and coherent sentences.

## 2018-03-02 NOTE — ED Notes (Signed)
Patient reports she was called by PCP to be evaluated for low hemoglobin. Patient denies any chest pain or SOB. States she feels a little more tired than normal but that that started in March when she had surgery on her shoulder.

## 2018-03-02 NOTE — Telephone Encounter (Signed)
fyi

## 2018-03-02 NOTE — Plan of Care (Signed)
  Problem: Education: Goal: Ability to identify signs and symptoms of gastrointestinal bleeding will improve Outcome: Progressing   Problem: Bowel/Gastric: Goal: Will show no signs and symptoms of gastrointestinal bleeding Outcome: Progressing   Problem: Fluid Volume: Goal: Will show no signs and symptoms of excessive bleeding Outcome: Progressing   Problem: Clinical Measurements: Goal: Complications related to the disease process, condition or treatment will be avoided or minimized Outcome: Progressing   Problem: Education: Goal: Knowledge of General Education information will improve Outcome: Progressing   Problem: Safety: Goal: Ability to remain free from injury will improve Outcome: Progressing   

## 2018-03-02 NOTE — Telephone Encounter (Signed)
FYI

## 2018-03-02 NOTE — H&P (Addendum)
[facility] NAME: Robin Alexander    MR#:  [id number]  DATE OF BIRTH:  [birth year]  DATE OF ADMISSION:  03/02/2018  PRIMARY CARE PHYSICIAN: Leone Haven, MD   REQUESTING/REFERRING PHYSICIAN: Dr. Corky Downs  CHIEF COMPLAINT:   Chief Complaint  Patient presents with  . Anemia    HISTORY OF PRESENT ILLNESS:  Robin Alexander  is a 84 y.o. female with a known history of recent right shoulder surgery for arthroplasty, recent admission for right foot drop came in because of abnormal labs with low hemoglobin.  Patient went to Dr. Candiss Norse for annual checkup, had blood work with hemoglobin 6.3 and patient was sent to PCP, we will refer the patient to ER.  Patient denies any black stool.  But complaining of fatigue recently.  No shortness of breath or chest pain.  Global 6.9 on blood work today.  Last hemoglobin was 8.9 in March 2019.  PAST MEDICAL HISTORY:   Past Medical History:  Diagnosis Date  . Abnormal CT scan, chest 03/20/2014  . Arthritis    shoulders  . Chicken pox   . Complication of anesthesia 2016   slow to wake up after anesthesia  . Dyspnea    uses inhaler when she gets bronchitis  . GERD (gastroesophageal reflux disease)   . Heart murmur   . High cholesterol   . HTN (hypertension)   . Pneumonia    spring 2015    PAST SURGICAL HISTOIRY:   Past Surgical History:  Procedure Laterality Date  . ABDOMINAL HYSTERECTOMY     in 50's  . ANTERIOR LAT LUMBAR FUSION Right 02/28/2015   Procedure: ANTERIOR LATERAL LUMBAR FUSION 1 LEVEL;  Surgeon: Phylliss Bob, MD;  Location: [facility];  Service: Orthopedics;  Laterality: Right;  Right sided lumbar 3-4 lateral interbody fusion  . APPENDECTOMY  1950  . BACK SURGERY     rod in back  . BICEPT TENODESIS Right 11/20/2017   Procedure: BICEPS TENODESIS;  Surgeon: Leim Fabry, MD;  Location: [facility];  Service: Orthopedics;  Laterality: Right;  . CATARACT EXTRACTION W/ INTRAOCULAR  LENS  IMPLANT, BILATERAL Bilateral 2011  . CESAREAN SECTION     2  . JOINT REPLACEMENT    . REVERSE SHOULDER ARTHROPLASTY Right 11/20/2017   Procedure: REVERSE SHOULDER ARTHROPLASTY;  Surgeon: Leim Fabry, MD;  Location: [facility];  Service: Orthopedics;  Laterality: Right;  . ROTATOR CUFF REPAIR Right    right shoulder   10 YRS  . TOTAL HIP ARTHROPLASTY Bilateral 2005, 2006   Bilateral, [place]    SOCIAL HISTORY:   Social History   Tobacco Use  . Smoking status: Former Smoker    Packs/day: 0.75    Years: 20.00    Pack years: 15.00    Types: Cigarettes    Last attempt to quit: 09/02/1983    Years since quitting: 34.5  . Smokeless tobacco: Never Used  Substance Use Topics  . Alcohol use: No    Alcohol/week: 0.0 oz    Comment: Occasional wine    FAMILY HISTORY:   Family History  Problem Relation Age of Onset  . Arthritis Mother   . Heart disease Mother   . Arthritis Father   . Stroke Brother   . Heart disease Brother   . Cancer Neg Hx     DRUG ALLERGIES:   Allergies  Allergen Reactions  . Adhesive [Tape]     Paper tape okay  . Codeine Itching  REVIEW OF SYSTEMS:  CONSTITUTIONAL: No fever, worsening fatigue recently.   EYES: No blurred or double vision.  EARS, NOSE, AND THROAT: No tinnitus or ear pain.  RESPIRATORY: No cough, shortness of breath, wheezing or hemoptysis.  CARDIOVASCULAR: No chest pain, orthopnea, edema.  GASTROINTESTINAL: No nausea, vomiting, diarrhea or abdominal pain.  GENITOURINARY: No dysuria, hematuria.  ENDOCRINE: No polyuria, nocturia,  HEMATOLOGY: No anemia, easy bruising or bleeding SKIN: No rash or lesion. MUSCULOSKELETAL: No joint pain or arthritis.   NEUROLOGIC: No tingling, numbness, weakness.  PSYCHIATRY: No anxiety or depression.   MEDICATIONS AT HOME:   Prior to Admission medications   Medication Sig Start Date End Date Taking? Authorizing Provider  amLODipine-benazepril (LOTREL) 5-20 MG capsule TAKE 1 CAPSULE BY  MOUTH  DAILY 01/27/18  Yes Leone Haven, MD  aspirin EC 325 MG EC tablet Take 1 tablet (325 mg total) by mouth daily. 11/21/17  Yes Lattie Corns, PA-C  Cyanocobalamin (VITAMIN B12 SL) Take 1-2 tablets by mouth 2 (two) times daily. Take 2 tablets in the morning and 1 tablet at night.   Yes [provider]  docusate sodium (COLACE) 100 MG capsule Take 100 mg by mouth daily.   Yes [provider]  loratadine (CLARITIN) 10 MG tablet Take 1 tablet (10 mg total) by mouth daily. 02/15/18  Yes Burnard Hawthorne, FNP  Magnesium 250 MG TABS Take 250 mg by mouth daily.   Yes [provider]  methocarbamol (ROBAXIN) 500 MG tablet Take 1 tablet (500 mg total) by mouth every 6 (six) hours as needed for muscle spasms. 11/20/17  Yes Lattie Corns, PA-C  Multiple Vitamin (MULTIVITAMIN WITH MINERALS) TABS tablet Take 1 tablet by mouth daily. CENTRUM SILVER   Yes [provider]  Omega-3 Fatty Acids (FISH OIL) 1000 MG CAPS Take 1,000-2,000 mg by mouth daily. TAKE 2 CAPSULES IN THE MORNING & 1 CAPSULE AT NIGHT   Yes [provider]  ranitidine (ZANTAC) 150 MG tablet Take 150 mg by mouth 2 (two) times daily.   Yes [provider]  simvastatin (ZOCOR) 20 MG tablet TAKE 1 TABLET BY MOUTH  EVERY MORNING Patient taking differently: TAKE 1 TABLET BY MOUTH  EVERY EVENING 02/11/18  Yes Leone Haven, MD  traMADol (ULTRAM) 50 MG tablet Take 0.5 tablets (25 mg total) by mouth every 6 (six) hours as needed for severe pain. 11/25/17  Yes Sudini, Alveta Heimlich, MD  Turmeric 500 MG CAPS Take 500 mg by mouth daily.    Yes [provider]  ondansetron (ZOFRAN) 4 MG tablet Take 1 tablet (4 mg total) by mouth every 6 (six) hours as needed for nausea. Patient not taking: Reported on 03/02/2018 11/20/17   Lattie Corns, PA-C  triamcinolone (KENALOG) 0.025 % cream Apply 1 application topically 2 (two) times daily. Patient not taking: Reported on 03/02/2018 02/15/18    Burnard Hawthorne, FNP      VITAL SIGNS:  Blood pressure (!) 159/62, pulse 98, temperature 98.1 F (36.7 C), temperature source Oral, resp. rate 14, height 5' (1.524 m), weight 65.3 kg (144 lb), SpO2 97 %.  PHYSICAL EXAMINATION:  GENERAL:  84 y.o.-year-old patient lying in the bed with no acute distress.  slightly pale in appearance. EYES: Pupils equal, round, reactive to light and accommodation. No scleral icterus. Extraocular muscles intact.  HEENT: Head atraumatic, normocephalic. Oropharynx and nasopharynx clear.  NECK:  Supple, no jugular venous distention. No thyroid enlargement, no tenderness.  LUNGS: Normal breath sounds bilaterally,  no wheezing, rales,rhonchi or crepitation. No use of accessory muscles of respiration.  CARDIOVASCULAR: S1, S2 normal. No murmurs, rubs, or gallops.  ABDOMEN: Soft, nontender, nondistended. Bowel sounds present. No organomegaly or mass.  EXTREMITIES: No pedal edema, cyanosis, or clubbing.  NEUROLOGIC: Cranial nerves II through XII are intact. Muscle strength 5/5 in all extremities. Sensation intact. Gait not checked.  PSYCHIATRIC: The patient is alert and oriented x 3.  SKIN: No obvious rash, lesion, or ulcer.   LABORATORY PANEL:   CBC Recent Labs  Lab 03/02/18 1233  WBC 7.4  HGB 6.9*  HCT 21.8*  PLT 277   ------------------------------------------------------------------------------------------------------------------  Chemistries  Recent Labs  Lab 03/02/18 1233  NA 142  K 4.5  CL 107  CO2 24  GLUCOSE 121*  BUN 34*  CREATININE 1.16*  CALCIUM 9.5  AST 22  ALT 17  ALKPHOS 74  BILITOT 0.7   ------------------------------------------------------------------------------------------------------------------  Cardiac Enzymes No results for input(s): TROPONINI in the last 168 hours. ------------------------------------------------------------------------------------------------------------------  RADIOLOGY:  No results  found.  EKG:   Orders placed or performed during the hospital encounter of 11/22/17  . EKG 12-Lead  . EKG 12-Lead  . ED EKG  . ED EKG  . EKG    IMPRESSION AND PLAN:  84 year old female patient with multiple medical problems arthritis of shoulders, GERD, hyperlipidemia, hypertensiion and then because of low hemoglobin.   1. symptomatic anemia, has been feeling weak recently.;transfuse 1 unit prbc,check iron and TIBC panel. 2.acute on chronic anemia, patient hemoglobin is dropping gradually from March 2019.  Transfused 1 unit of packed RBC, patient is guiac positive.  Check anemia panel, hold aspirin.  Continue PPIs.consult GI for EGD/colonoscopy #3.essential htn;controlled  4,CKD stage 2;followed by [facility],    All the records are reviewed and case discussed with ED provider. Management plans discussed with the patient, family and they are in agreement.  CODE STATUS: full  TOTAL TIME TAKING CARE OF THIS PATIENT:16minutes.    Epifanio Lesches M.D on 03/02/2018 at 2:07 PM  Between 7am to 6pm - Pager - [phone]  After 6pm go to [email or web address] - password [email or web address] [facility]  [facility]  Office  [phone]  CC: Primary care physician; Leone Haven, MD  Note: This dictation was prepared with Dragon dictation along with smaller phrase technology. Any transcriptional errors that result from this process are unintentional.

## 2018-03-02 NOTE — Telephone Encounter (Signed)
Called patient and informed her of message below. Patient denies any symptoms. Patient will have someone take her to [facility] ER now. I called charge nurse at ER and informed him of patients hemoglobin and informed him that patient is on the way.

## 2018-03-02 NOTE — Telephone Encounter (Signed)
Copied from [place] [id number]. Topic: General - Other >> Mar 02, 2018 10:57 AM Adelene Idler wrote: Ilona Sorrel from [facility] is calling in stating the pt came in for labs and her HGB IS 6.3. They are faxing a copy to the office for [place] to take a look at.

## 2018-03-02 NOTE — ED Provider Notes (Signed)
[facility] Emergency Department Provider Note   ____________________________________________    I have reviewed the triage vital signs and the nursing notes.   HISTORY  Chief Complaint Anemia     HPI Robin Alexander is a 84 y.o. female who was sent in by her PCP for low hemoglobin.  Reportedly she was found to have a hemoglobin of 6.3 and was called today and told to come to the emergency department.  Patient reports feeling very low energy and occasionally lightheaded.  Denies chest pain.  Denies rectal bleeding.  Not on blood thinners.  She has never had this problem before.  She did have surgery back in March and attributed her feeling low energy to still recovering from that surgery   Past Medical History:  Diagnosis Date  . Abnormal CT scan, chest 03/20/2014  . Arthritis    shoulders  . Chicken pox   . Complication of anesthesia 2016   slow to wake up after anesthesia  . Dyspnea    uses inhaler when she gets bronchitis  . GERD (gastroesophageal reflux disease)   . Heart murmur   . High cholesterol   . HTN (hypertension)   . Pneumonia    spring 2015    Patient Active Problem List   Diagnosis Date Noted  . Foot drop 11/23/2017  . Status post shoulder replacement 11/20/2017  . Postmenopausal estrogen deficiency 10/15/2017  . Localized osteoarthritis of right shoulder 10/15/2017  . Difficulty sleeping 10/15/2017  . Acute bronchitis 12/29/2016  . Bilateral knee pain 06/23/2014  . Lumbar radiculopathy, chronic 06/23/2014  . Aortic stenosis, mild 11/03/2013  . Right bundle branch block 05/04/2013  . Hyperlipidemia 06/29/2012  . GERD (gastroesophageal reflux disease) 07/31/2011  . Hypertension 07/31/2011    Past Surgical History:  Procedure Laterality Date  . ABDOMINAL HYSTERECTOMY     in 50's  . ANTERIOR LAT LUMBAR FUSION Right 02/28/2015   Procedure: ANTERIOR LATERAL LUMBAR FUSION 1 LEVEL;  Surgeon: Phylliss Bob, MD;  Location:  [facility];  Service: Orthopedics;  Laterality: Right;  Right sided lumbar 3-4 lateral interbody fusion  . APPENDECTOMY  1950  . BACK SURGERY     rod in back  . BICEPT TENODESIS Right 11/20/2017   Procedure: BICEPS TENODESIS;  Surgeon: Leim Fabry, MD;  Location: [facility];  Service: Orthopedics;  Laterality: Right;  . CATARACT EXTRACTION W/ INTRAOCULAR LENS  IMPLANT, BILATERAL Bilateral 2011  . CESAREAN SECTION     2  . JOINT REPLACEMENT    . REVERSE SHOULDER ARTHROPLASTY Right 11/20/2017   Procedure: REVERSE SHOULDER ARTHROPLASTY;  Surgeon: Leim Fabry, MD;  Location: [facility];  Service: Orthopedics;  Laterality: Right;  . ROTATOR CUFF REPAIR Right    right shoulder   10 YRS  . TOTAL HIP ARTHROPLASTY Bilateral 2005, 2006   Bilateral, [place]    Prior to Admission medications   Medication Sig Start Date End Date Taking? Authorizing Provider  amLODipine-benazepril (LOTREL) 5-20 MG capsule TAKE 1 CAPSULE BY MOUTH  DAILY 01/27/18  Yes Leone Haven, MD  aspirin EC 325 MG EC tablet Take 1 tablet (325 mg total) by mouth daily. 11/21/17  Yes Lattie Corns, PA-C  Cyanocobalamin (VITAMIN B12 SL) Take 1-2 tablets by mouth 2 (two) times daily. Take 2 tablets in the morning and 1 tablet at night.   Yes [provider]  docusate sodium (COLACE) 100 MG capsule Take 100 mg by mouth daily.   Yes [provider]  loratadine (  CLARITIN) 10 MG tablet Take 1 tablet (10 mg total) by mouth daily. 02/15/18  Yes Burnard Hawthorne, FNP  Magnesium 250 MG TABS Take 250 mg by mouth daily.   Yes [provider]  methocarbamol (ROBAXIN) 500 MG tablet Take 1 tablet (500 mg total) by mouth every 6 (six) hours as needed for muscle spasms. 11/20/17  Yes Lattie Corns, PA-C  Multiple Vitamin (MULTIVITAMIN WITH MINERALS) TABS tablet Take 1 tablet by mouth daily. CENTRUM SILVER   Yes [provider]  Omega-3 Fatty Acids (FISH OIL) 1000 MG CAPS Take 1,000-2,000 mg by mouth  daily. TAKE 2 CAPSULES IN THE MORNING & 1 CAPSULE AT NIGHT   Yes [provider]  ranitidine (ZANTAC) 150 MG tablet Take 150 mg by mouth 2 (two) times daily.   Yes [provider]  simvastatin (ZOCOR) 20 MG tablet TAKE 1 TABLET BY MOUTH  EVERY MORNING Patient taking differently: TAKE 1 TABLET BY MOUTH  EVERY EVENING 02/11/18  Yes Leone Haven, MD  traMADol (ULTRAM) 50 MG tablet Take 0.5 tablets (25 mg total) by mouth every 6 (six) hours as needed for severe pain. 11/25/17  Yes Sudini, Alveta Heimlich, MD  Turmeric 500 MG CAPS Take 500 mg by mouth daily.    Yes [provider]  ondansetron (ZOFRAN) 4 MG tablet Take 1 tablet (4 mg total) by mouth every 6 (six) hours as needed for nausea. Patient not taking: Reported on 03/02/2018 11/20/17   Lattie Corns, PA-C  triamcinolone (KENALOG) 0.025 % cream Apply 1 application topically 2 (two) times daily. Patient not taking: Reported on 03/02/2018 02/15/18   Burnard Hawthorne, FNP     Allergies Adhesive [tape] and Codeine  Family History  Problem Relation Age of Onset  . Arthritis Mother   . Heart disease Mother   . Arthritis Father   . Stroke Brother   . Heart disease Brother   . Cancer Neg Hx     Social History Social History   Tobacco Use  . Smoking status: Former Smoker    Packs/day: 0.75    Years: 20.00    Pack years: 15.00    Types: Cigarettes    Last attempt to quit: 09/02/1983    Years since quitting: 34.5  . Smokeless tobacco: Never Used  Substance Use Topics  . Alcohol use: No    Alcohol/week: 0.0 oz    Comment: Occasional wine  . Drug use: No    Review of Systems  Constitutional: No fever/chills Eyes: No visual changes.  ENT: No sore throat. Cardiovascular: Denies chest pain. Respiratory: Denies shortness of breath. Gastrointestinal: No abdominal pain.  No nausea, no vomiting.   Genitourinary: Negative for dysuria. Musculoskeletal: Negative for back pain. Skin: Negative for  rash. Neurological: Negative for headaches    ____________________________________________   PHYSICAL EXAM:  VITAL SIGNS: ED Triage Vitals  Enc Vitals Group     BP 03/02/18 1222 (!) 161/59     Pulse Rate 03/02/18 1222 98     Resp 03/02/18 1222 16     Temp 03/02/18 1222 98.1 F (36.7 C)     Temp Source 03/02/18 1222 Oral     SpO2 03/02/18 1222 97 %     Weight 03/02/18 1226 65.3 kg (144 lb)     Height 03/02/18 1226 1.524 m (5')     Head Circumference --      Peak Flow --      Pain Score 03/02/18 1226 0  Pain Loc --      Pain Edu? --      Excl. in [place]? --     Constitutional: Alert and oriented. No acute distress. Pleasant and interactive Eyes: Conjunctivae are pale Head: Atraumatic. Nose: No congestion/rhinnorhea. Mouth/Throat: Mucous membranes are moist.    Cardiovascular: Normal rate, regular rhythm. Grossly normal heart sounds.  Good peripheral circulation. Respiratory: Normal respiratory effort.  No retractions. Lungs CTAB. Gastrointestinal: Soft and nontender. No distention.  No CVA tenderness.  Brown stool, guaiac positive  Musculoskeletal:  Warm and well perfused Neurologic:  Normal speech and language. No gross focal neurologic deficits are appreciated.  Skin:  Skin is warm, dry and intact. No rash noted. Psychiatric: Mood and affect are normal. Speech and behavior are normal.  ____________________________________________   LABS (all labs ordered are listed, but only abnormal results are displayed)  Labs Reviewed  COMPREHENSIVE METABOLIC PANEL - Abnormal; Notable for the following components:      Result Value   Glucose, Bld 121 (*)    BUN 34 (*)    Creatinine, Ser 1.16 (*)    GFR calc non Af Amer 42 (*)    GFR calc Af Amer 49 (*)    All other components within normal limits  CBC - Abnormal; Notable for the following components:   RBC 2.75 (*)    Hemoglobin 6.9 (*)    HCT 21.8 (*)    MCV 79.2 (*)    MCH 25.3 (*)    MCHC 31.9 (*)    RDW 16.8 (*)     All other components within normal limits  TYPE AND SCREEN  PREPARE RBC (CROSSMATCH)   ____________________________________________  EKG  None ____________________________________________  RADIOLOGY   ____________________________________________   PROCEDURES  Procedure(s) performed: No  Procedures   Critical Care performed: No ____________________________________________   INITIAL IMPRESSION / ASSESSMENT AND PLAN / ED COURSE  Pertinent labs & imaging results that were available during my care of the patient were reviewed by me and considered in my medical decision making (see chart for details).  Patient presents with hemoglobin of 6.9 on our tests.  Brown stool guaiac positive, will transfuse PRBCs, permission obtained from patient.  Will admit to the hospital service    ____________________________________________   FINAL CLINICAL IMPRESSION(S) / ED DIAGNOSES  Final diagnoses:  Gastrointestinal hemorrhage, unspecified gastrointestinal hemorrhage type        Note:  This document was prepared using Dragon voice recognition software and may include unintentional dictation errors.    Lavonia Drafts, MD 03/02/18 [phone]

## 2018-03-02 NOTE — Consult Note (Signed)
Robin Alexander , MD [address] [place], [place], [place], [address] [address], [place], [place], [place], [address] Phone: [phone]  Fax: [phone]  Consultation  Referring Provider: Dr Vianne Bulls Primary Care Physician:  Leone Haven, MD Primary Gastroenterologist:  None          Reason for Consultation:     Anemia.   Date of Admission:  03/02/2018 Date of Consultation:  03/02/2018         HPI:   Robin Alexander is a 84 y.o. female presented to the ER for low blood count .  No overt blood loss. Was incidentally found out during her routine blood work. Last colonoscopy was back in 2010 -no report available but was said to be normal.   Denies any NSAID use, no abdominal pain . Son died from colon cancer.    CBC Latest Ref Rng & Units 03/02/2018 11/22/2017 11/21/2017  WBC 3.6 - 11.0 K/uL 7.4 9.0 7.8  Hemoglobin 12.0 - 16.0 g/dL 6.9(L) 8.9(L) 9.1(L)  Hematocrit 35.0 - 47.0 % 21.8(L) 26.3(L) 26.0(L)  Platelets 150 - 440 K/uL 277 151 170     Past Medical History:  Diagnosis Date  . Abnormal CT scan, chest 03/20/2014  . Arthritis    shoulders  . Chicken pox   . Complication of anesthesia 2016   slow to wake up after anesthesia  . Dyspnea    uses inhaler when she gets bronchitis  . GERD (gastroesophageal reflux disease)   . Heart murmur   . High cholesterol   . HTN (hypertension)   . Pneumonia    spring 2015    Past Surgical History:  Procedure Laterality Date  . ABDOMINAL HYSTERECTOMY     in 50's  . ANTERIOR LAT LUMBAR FUSION Right 02/28/2015   Procedure: ANTERIOR LATERAL LUMBAR FUSION 1 LEVEL;  Surgeon: Phylliss Bob, MD;  Location: [facility];  Service: Orthopedics;  Laterality: Right;  Right sided lumbar 3-4 lateral interbody fusion  . APPENDECTOMY  1950  . BACK SURGERY     rod in back  . BICEPT TENODESIS Right 11/20/2017   Procedure: BICEPS TENODESIS;  Surgeon: Leim Fabry, MD;  Location: [facility];  Service: Orthopedics;  Laterality: Right;  . CATARACT  EXTRACTION W/ INTRAOCULAR LENS  IMPLANT, BILATERAL Bilateral 2011  . CESAREAN SECTION     2  . JOINT REPLACEMENT    . REVERSE SHOULDER ARTHROPLASTY Right 11/20/2017   Procedure: REVERSE SHOULDER ARTHROPLASTY;  Surgeon: Leim Fabry, MD;  Location: [facility];  Service: Orthopedics;  Laterality: Right;  . ROTATOR CUFF REPAIR Right    right shoulder   10 YRS  . TOTAL HIP ARTHROPLASTY Bilateral 2005, 2006   Bilateral, [place]    Prior to Admission medications   Medication Sig Start Date End Date Taking? Authorizing Provider  amLODipine-benazepril (LOTREL) 5-20 MG capsule TAKE 1 CAPSULE BY MOUTH  DAILY 01/27/18  Yes Leone Haven, MD  aspirin EC 325 MG EC tablet Take 1 tablet (325 mg total) by mouth daily. 11/21/17  Yes Lattie Corns, PA-C  Cyanocobalamin (VITAMIN B12 SL) Take 1-2 tablets by mouth 2 (two) times daily. Take 2 tablets in the morning and 1 tablet at night.   Yes [provider]  docusate sodium (COLACE) 100 MG capsule Take 100 mg by mouth daily.   Yes [provider]  loratadine (CLARITIN) 10 MG tablet Take 1 tablet (10 mg total) by mouth daily. 02/15/18  Yes Burnard Hawthorne, FNP  Magnesium  250 MG TABS Take 250 mg by mouth daily.   Yes [provider]  methocarbamol (ROBAXIN) 500 MG tablet Take 1 tablet (500 mg total) by mouth every 6 (six) hours as needed for muscle spasms. 11/20/17  Yes Lattie Corns, PA-C  Multiple Vitamin (MULTIVITAMIN WITH MINERALS) TABS tablet Take 1 tablet by mouth daily. CENTRUM SILVER   Yes [provider]  Omega-3 Fatty Acids (FISH OIL) 1000 MG CAPS Take 1,000-2,000 mg by mouth daily. TAKE 2 CAPSULES IN THE MORNING & 1 CAPSULE AT NIGHT   Yes [provider]  ranitidine (ZANTAC) 150 MG tablet Take 150 mg by mouth 2 (two) times daily.   Yes [provider]  simvastatin (ZOCOR) 20 MG tablet TAKE 1 TABLET BY MOUTH  EVERY MORNING Patient taking differently: TAKE 1 TABLET BY MOUTH  EVERY  EVENING 02/11/18  Yes Leone Haven, MD  traMADol (ULTRAM) 50 MG tablet Take 0.5 tablets (25 mg total) by mouth every 6 (six) hours as needed for severe pain. 11/25/17  Yes Sudini, Alveta Heimlich, MD  Turmeric 500 MG CAPS Take 500 mg by mouth daily.    Yes [provider]  ondansetron (ZOFRAN) 4 MG tablet Take 1 tablet (4 mg total) by mouth every 6 (six) hours as needed for nausea. Patient not taking: Reported on 03/02/2018 11/20/17   Lattie Corns, PA-C  triamcinolone (KENALOG) 0.025 % cream Apply 1 application topically 2 (two) times daily. Patient not taking: Reported on 03/02/2018 02/15/18   Burnard Hawthorne, FNP    Family History  Problem Relation Age of Onset  . Arthritis Mother   . Heart disease Mother   . Arthritis Father   . Stroke Brother   . Heart disease Brother   . Cancer Neg Hx      Social History   Tobacco Use  . Smoking status: Former Smoker    Packs/day: 0.75    Years: 20.00    Pack years: 15.00    Types: Cigarettes    Last attempt to quit: 09/02/1983    Years since quitting: 34.5  . Smokeless tobacco: Never Used  Substance Use Topics  . Alcohol use: No    Alcohol/week: 0.0 oz    Comment: Occasional wine  . Drug use: No    Allergies as of 03/02/2018 - Review Complete 03/02/2018  Allergen Reaction Noted  . Adhesive [tape]  11/17/2017  . Codeine Itching 12/26/2011    Review of Systems:    All systems reviewed and negative except where noted in HPI.   Physical Exam:  Vital signs in last 24 hours: Temp:  [98.1 F (36.7 C)] 98.1 F (36.7 C) (07/02 1222) Pulse Rate:  [98] 98 (07/02 1222) Resp:  [14-16] 14 (07/02 1330) BP: (159-161)/(59-62) 159/62 (07/02 1330) SpO2:  [97 %] 97 % (07/02 1222) Weight:  [144 lb (65.3 kg)] 144 lb (65.3 kg) (07/02 1226)   General:   Pleasant, cooperative in NAD Head:  Normocephalic and atraumatic. Eyes:   No icterus.   Conjunctiva pink. PERRLA. Ears:  Normal auditory acuity. Neck:  Supple; no masses or  thyroidomegaly Lungs: Respirations even and unlabored. Lungs clear to auscultation bilaterally.   No wheezes, crackles, or rhonchi.  Heart:  Regular rate and rhythm;  Systolic murmur in aortic area. No clicks, rubs or gallops Abdomen:  Soft, nondistended, nontender. Normal bowel sounds. No appreciable masses or hepatomegaly.  No rebound or guarding.  Neurologic:  Alert and oriented x3;  grossly normal neurologically. Skin:  Intact  without significant lesions or rashes. Cervical Nodes:  No significant cervical adenopathy. Psych:  Alert and cooperative. Normal affect.  LAB RESULTS: Recent Labs    03/02/18 1233  WBC 7.4  HGB 6.9*  HCT 21.8*  PLT 277   BMET Recent Labs    03/02/18 1233  NA 142  K 4.5  CL 107  CO2 24  GLUCOSE 121*  BUN 34*  CREATININE 1.16*  CALCIUM 9.5   LFT Recent Labs    03/02/18 1233  PROT 7.9  ALBUMIN 4.2  AST 22  ALT 17  ALKPHOS 74  BILITOT 0.7   PT/INR No results for input(s): LABPROT, INR in the last 72 hours.  STUDIES: No results found.    Impression / Plan:   Robin Alexander is a 84 y.o. y/o female presented to the ER with incidental finding of a microcytic anemia . No overt blood loss. Very likely a chronic process. 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    Plan  1. Check iron, b12,folate 2. EGD+colonoscopy tomorrow and if negative will need capsule study of the small bowel  3. Monitor CBC and transfuse   I have discussed alternative options, risks & benefits,  which include, but are not limited to, bleeding, infection, perforation,respiratory complication & drug reaction.  The patient agrees with this plan & written consent will be obtained.       Thank you for involving me in the care of this patient.      LOS: 0 days   Robin Bellows, MD  03/02/2018, 2:37 PM

## 2018-03-03 ENCOUNTER — Inpatient Hospital Stay: Payer: Medicare Other | Admitting: Anesthesiology

## 2018-03-03 ENCOUNTER — Encounter
Admission: EM | Disposition: A | Discharge status: Home / Self Care | Payer: Self-pay | Source: Home / Self Care | Attending: Internal Medicine

## 2018-03-03 ENCOUNTER — Encounter: Payer: Self-pay | Admitting: *Deleted

## 2018-03-03 DIAGNOSIS — Z87891 Personal history of nicotine dependence: Secondary | ICD-10-CM | POA: Diagnosis not present

## 2018-03-03 DIAGNOSIS — Z981 Arthrodesis status: Secondary | ICD-10-CM | POA: Diagnosis not present

## 2018-03-03 DIAGNOSIS — Z7982 Long term (current) use of aspirin: Secondary | ICD-10-CM | POA: Diagnosis not present

## 2018-03-03 DIAGNOSIS — Z79899 Other long term (current) drug therapy: Secondary | ICD-10-CM | POA: Diagnosis not present

## 2018-03-03 DIAGNOSIS — I129 Hypertensive chronic kidney disease with stage 1 through stage 4 chronic kidney disease, or unspecified chronic kidney disease: Secondary | ICD-10-CM | POA: Diagnosis not present

## 2018-03-03 DIAGNOSIS — K2970 Gastritis, unspecified, without bleeding: Secondary | ICD-10-CM | POA: Diagnosis not present

## 2018-03-03 DIAGNOSIS — K2981 Duodenitis with bleeding: Secondary | ICD-10-CM | POA: Diagnosis not present

## 2018-03-03 DIAGNOSIS — Q2733 Arteriovenous malformation of digestive system vessel: Secondary | ICD-10-CM | POA: Diagnosis not present

## 2018-03-03 DIAGNOSIS — K264 Chronic or unspecified duodenal ulcer with hemorrhage: Secondary | ICD-10-CM | POA: Diagnosis not present

## 2018-03-03 DIAGNOSIS — Z9841 Cataract extraction status, right eye: Secondary | ICD-10-CM | POA: Diagnosis not present

## 2018-03-03 DIAGNOSIS — I10 Essential (primary) hypertension: Secondary | ICD-10-CM | POA: Diagnosis not present

## 2018-03-03 DIAGNOSIS — D500 Iron deficiency anemia secondary to blood loss (chronic): Secondary | ICD-10-CM | POA: Diagnosis not present

## 2018-03-03 DIAGNOSIS — K5790 Diverticulosis of intestine, part unspecified, without perforation or abscess without bleeding: Secondary | ICD-10-CM | POA: Diagnosis not present

## 2018-03-03 DIAGNOSIS — D509 Iron deficiency anemia, unspecified: Secondary | ICD-10-CM | POA: Diagnosis not present

## 2018-03-03 DIAGNOSIS — K922 Gastrointestinal hemorrhage, unspecified: Secondary | ICD-10-CM | POA: Diagnosis not present

## 2018-03-03 DIAGNOSIS — K219 Gastro-esophageal reflux disease without esophagitis: Secondary | ICD-10-CM | POA: Diagnosis not present

## 2018-03-03 DIAGNOSIS — Z91048 Other nonmedicinal substance allergy status: Secondary | ICD-10-CM | POA: Diagnosis not present

## 2018-03-03 DIAGNOSIS — K5520 Angiodysplasia of colon without hemorrhage: Secondary | ICD-10-CM | POA: Diagnosis not present

## 2018-03-03 DIAGNOSIS — K2990 Gastroduodenitis, unspecified, without bleeding: Secondary | ICD-10-CM | POA: Diagnosis not present

## 2018-03-03 DIAGNOSIS — Z885 Allergy status to narcotic agent status: Secondary | ICD-10-CM | POA: Diagnosis not present

## 2018-03-03 DIAGNOSIS — Z9842 Cataract extraction status, left eye: Secondary | ICD-10-CM | POA: Diagnosis not present

## 2018-03-03 DIAGNOSIS — K269 Duodenal ulcer, unspecified as acute or chronic, without hemorrhage or perforation: Secondary | ICD-10-CM | POA: Diagnosis not present

## 2018-03-03 DIAGNOSIS — Z8249 Family history of ischemic heart disease and other diseases of the circulatory system: Secondary | ICD-10-CM | POA: Diagnosis not present

## 2018-03-03 DIAGNOSIS — N182 Chronic kidney disease, stage 2 (mild): Secondary | ICD-10-CM | POA: Diagnosis not present

## 2018-03-03 DIAGNOSIS — D649 Anemia, unspecified: Secondary | ICD-10-CM | POA: Diagnosis not present

## 2018-03-03 DIAGNOSIS — Z96643 Presence of artificial hip joint, bilateral: Secondary | ICD-10-CM | POA: Diagnosis not present

## 2018-03-03 DIAGNOSIS — Z96611 Presence of right artificial shoulder joint: Secondary | ICD-10-CM | POA: Diagnosis not present

## 2018-03-03 DIAGNOSIS — Z961 Presence of intraocular lens: Secondary | ICD-10-CM | POA: Diagnosis not present

## 2018-03-03 HISTORY — PX: COLONOSCOPY WITH PROPOFOL: SHX5780

## 2018-03-03 HISTORY — PX: ESOPHAGOGASTRODUODENOSCOPY (EGD) WITH PROPOFOL: SHX5813

## 2018-03-03 LAB — TYPE AND SCREEN
ABO/RH(D): A POS
ANTIBODY SCREEN: NEGATIVE
Unit division: 0

## 2018-03-03 LAB — BASIC METABOLIC PANEL
ANION GAP: 9 (ref 5–15)
BUN: 26 mg/dL — ABNORMAL HIGH (ref 8–23)
CALCIUM: 9.2 mg/dL (ref 8.9–10.3)
CO2: 23 mmol/L (ref 22–32)
Chloride: 109 mmol/L (ref 98–111)
Creatinine, Ser: 1.05 mg/dL — ABNORMAL HIGH (ref 0.44–1.00)
GFR calc non Af Amer: 48 mL/min — ABNORMAL LOW (ref >60)
GFR, EST AFRICAN AMERICAN: 55 mL/min — AB (ref >60)
Glucose, Bld: 91 mg/dL (ref 70–99)
POTASSIUM: 4.3 mmol/L (ref 3.5–5.1)
Sodium: 141 mmol/L (ref 135–145)

## 2018-03-03 LAB — CBC
HCT: 25.4 % — ABNORMAL LOW (ref 35.0–47.0)
HEMOGLOBIN: 8.3 g/dL — AB (ref 12.0–16.0)
MCH: 26.5 pg (ref 26.0–34.0)
MCHC: 32.7 g/dL (ref 32.0–36.0)
MCV: 80.9 fL (ref 80.0–100.0)
Platelets: 266 10*3/uL (ref 150–440)
RBC: 3.14 MIL/uL — AB (ref 3.80–5.20)
RDW: 16 % — ABNORMAL HIGH (ref 11.5–14.5)
WBC: 7.8 10*3/uL (ref 3.6–11.0)

## 2018-03-03 LAB — BPAM RBC
Blood Product Expiration Date: 201907162359
ISSUE DATE / TIME: 201907021644
Unit Type and Rh: 6200

## 2018-03-03 SURGERY — ESOPHAGOGASTRODUODENOSCOPY (EGD) WITH PROPOFOL
Anesthesia: General

## 2018-03-03 MED ORDER — FENTANYL CITRATE (PF) 100 MCG/2ML IJ SOLN
INTRAMUSCULAR | Status: DC | PRN
  Administered 2018-03-03: 50 ug via INTRAVENOUS

## 2018-03-03 MED ORDER — SODIUM CHLORIDE 0.9 % IV SOLN
510.0000 mg | Freq: Once | INTRAVENOUS | Status: AC
  Administered 2018-03-03: 510 mg via INTRAVENOUS
  Filled 2018-03-03: qty 17

## 2018-03-03 MED ORDER — FERROUS SULFATE 325 (65 FE) MG PO TBEC: 325.0000 mg | DELAYED_RELEASE_TABLET | Freq: Two times a day (BID) | ORAL | 0 refills | Status: DC

## 2018-03-03 MED ORDER — PROPOFOL 500 MG/50ML IV EMUL
INTRAVENOUS | Status: DC | PRN
  Administered 2018-03-03: 140 ug/kg/min via INTRAVENOUS

## 2018-03-03 MED ORDER — LIDOCAINE 2% (20 MG/ML) 5 ML SYRINGE
INTRAMUSCULAR | Status: DC | PRN
  Administered 2018-03-03: 30 mg via INTRAVENOUS

## 2018-03-03 MED ORDER — FENTANYL CITRATE (PF) 100 MCG/2ML IJ SOLN
INTRAMUSCULAR | Status: AC
  Filled 2018-03-03: qty 2

## 2018-03-03 MED ORDER — PHENYLEPHRINE HCL 10 MG/ML IJ SOLN
INTRAMUSCULAR | Status: DC | PRN
  Administered 2018-03-03 (×3): 100 ug via INTRAVENOUS

## 2018-03-03 MED ORDER — PROPOFOL 500 MG/50ML IV EMUL
INTRAVENOUS | Status: AC
  Filled 2018-03-03: qty 50

## 2018-03-03 MED ORDER — PROPOFOL 10 MG/ML IV BOLUS
INTRAVENOUS | Status: DC | PRN
  Administered 2018-03-03: 100 mg via INTRAVENOUS

## 2018-03-03 MED ORDER — LIDOCAINE HCL (PF) 2 % IJ SOLN
INTRAMUSCULAR | Status: AC
  Filled 2018-03-03: qty 10

## 2018-03-03 MED ORDER — MORPHINE SULFATE (PF) 2 MG/ML IV SOLN
1.0000 mg | INTRAVENOUS | Status: DC | PRN
  Administered 2018-03-03: 1 mg via INTRAVENOUS
  Filled 2018-03-03: qty 1

## 2018-03-03 MED ORDER — EPHEDRINE SULFATE 50 MG/ML IJ SOLN
INTRAMUSCULAR | Status: DC | PRN
  Administered 2018-03-03: 5 mg via INTRAVENOUS

## 2018-03-03 MED ORDER — PANTOPRAZOLE SODIUM 40 MG PO TBEC: 40.0000 mg | DELAYED_RELEASE_TABLET | Freq: Two times a day (BID) | ORAL | 0 refills | Status: DC

## 2018-03-03 NOTE — Anesthesia Preprocedure Evaluation (Signed)
Anesthesia Evaluation  Patient identified by MRN, date of birth, ID band Patient awake    Reviewed: Allergy & Precautions, Patient's Chart, lab work & pertinent test results  History of Anesthesia Complications (+) PROLONGED EMERGENCE and history of anesthetic complications  Airway Mallampati: III       Dental  (+) Dental Advidsory Given   Pulmonary neg sleep apnea, neg COPD, former smoker,           Cardiovascular hypertension, Pt. on medications + Peripheral Vascular Disease  (-) Past MI and (-) CHF + dysrhythmias (RBBB) + Valvular Problems/Murmurs (moderate AS, no symptoms) AS      Neuro/Psych neg Seizures    GI/Hepatic Neg liver ROS, GERD  Medicated and Controlled,  Endo/Other  neg diabetes  Renal/GU negative Renal ROS     Musculoskeletal   Abdominal   Peds  Hematology  (+) Blood dyscrasia, anemia ,   Anesthesia Other Findings Past Medical History: 03/20/2014: Abnormal CT scan, chest No date: Arthritis     Comment:  shoulders No date: Chicken pox 1916: Complication of anesthesia     Comment:  slow to wake up after anesthesia No date: Dyspnea     Comment:  uses inhaler when she gets bronchitis No date: GERD (gastroesophageal reflux disease) No date: Heart murmur No date: High cholesterol No date: HTN (hypertension) No date: Pneumonia     Comment:  spring 2015   Reproductive/Obstetrics                             Anesthesia Physical  Anesthesia Plan  ASA: III  Anesthesia Plan: General   Post-op Pain Management:    Induction: Intravenous  PONV Risk Score and Plan: 3 and Propofol infusion  Airway Management Planned: Nasal Cannula  Additional Equipment:   Intra-op Plan:   Post-operative Plan:   Informed Consent: I have reviewed the patients History and Physical, chart, labs and discussed the procedure including the risks, benefits and alternatives for the proposed  anesthesia with the patient or authorized representative who has indicated his/her understanding and acceptance.     Plan Discussed with:   Anesthesia Plan Comments:         Anesthesia Quick Evaluation

## 2018-03-03 NOTE — Anesthesia Post-op Follow-up Note (Signed)
Anesthesia QCDR form completed.        

## 2018-03-03 NOTE — Op Note (Signed)
[facility] Gastroenterology Patient Name: Robin Alexander Procedure Date: 03/03/2018 12:19 PM MRN: [id number] Account #: [id number] Date of Birth: 06/29/1933 Admit Type: Inpatient Age: 84 Room: [facility] ENDO ROOM 1 Gender: Female Note Status: Finalized Procedure:            Colonoscopy Indications:          Iron deficiency anemia Providers:            Jonathon Bellows MD, MD Referring MD:         Angela Adam. Caryl Bis (Referring MD) Medicines:            Monitored Anesthesia Care Complications:        No immediate complications. Procedure:            Pre-Anesthesia Assessment:                       - Prior to the procedure, a History and Physical was                        performed, and patient medications, allergies and                        sensitivities were reviewed. The patient's tolerance of                        previous anesthesia was reviewed.                       - The risks and benefits of the procedure and the                        sedation options and risks were discussed with the                        patient. All questions were answered and informed                        consent was obtained.                       - ASA Grade Assessment: III - A patient with severe                        systemic disease.                       After obtaining informed consent, the colonoscope was                        passed under direct vision. Throughout the procedure,                        the patient's blood pressure, pulse, and oxygen                        saturations were monitored continuously. The                        Colonoscope was introduced through the anus and                        advanced  to the the cecum, identified by the                        appendiceal orifice, IC valve and transillumination.                        The colonoscopy was performed with ease. The patient                        tolerated the procedure well. The quality of the bowel                         preparation was good. Findings:      The perianal and digital rectal examinations were normal.      Multiple small-mouthed diverticula were found in the sigmoid colon.      A single large localized angioectasia without bleeding was found in the       cecum. Coagulation for bleeding prevention using argon plasma at 0.5       liters/minute and 20 watts was successful.      The exam was otherwise without abnormality on direct and retroflexion       views. Impression:           - Diverticulosis in the sigmoid colon.                       - A single non-bleeding colonic angioectasia. Treated                        with argon plasma coagulation (APC).                       - The examination was otherwise normal on direct and                        retroflexion views.                       - No specimens collected. Recommendation:       - Return patient to hospital ward for ongoing care.                       - Resume previous diet.                       - Continue present medications.                       - No active bleeding                       IV iron                       F/u as an outpatient likely will need capsule study Procedure Code(s):    --- Professional ---                       [phone], Colonoscopy, flexible; with control of bleeding,                        any method Diagnosis Code(s):    --- Professional ---  D50.9, Iron deficiency anemia, unspecified                       K55.20, Angiodysplasia of colon without hemorrhage                       K57.30, Diverticulosis of large intestine without                        perforation or abscess without bleeding CPT copyright 2017 American Medical Association. All rights reserved. The codes documented in this report are preliminary and upon coder review may  be revised to meet current compliance requirements. Jonathon Bellows, MD Jonathon Bellows MD, MD 03/03/2018 12:59:14 PM This report has been  signed electronically. Number of Addenda: 0 Note Initiated On: 03/03/2018 12:19 PM Scope Withdrawal Time: 0 hours 11 minutes 54 seconds  Total Procedure Duration: 0 hours 18 minutes 26 seconds       [facility]

## 2018-03-03 NOTE — Progress Notes (Signed)
Pt discharged home at this time  A/o no distress or voiced c/o.  Pt received hemeiron iv prior to discharge. ivs x 2 d/cd. Instructions discussed with pt and spouse. meds diet activity and f/u discussed.  Verbalizes understanding.

## 2018-03-03 NOTE — Discharge Instructions (Signed)
Resume diet and activity as before ° ° °

## 2018-03-03 NOTE — Anesthesia Postprocedure Evaluation (Signed)
Anesthesia Post Note  Patient: Robin Alexander  Procedure(s) Performed: ESOPHAGOGASTRODUODENOSCOPY (EGD) WITH PROPOFOL (N/A ) COLONOSCOPY WITH PROPOFOL (N/A )  Patient location during evaluation: Endoscopy Anesthesia Type: General Level of consciousness: awake and alert Pain management: pain level controlled Vital Signs Assessment: post-procedure vital signs reviewed and stable Respiratory status: spontaneous breathing, nonlabored ventilation, respiratory function stable and patient connected to nasal cannula oxygen Cardiovascular status: blood pressure returned to baseline and stable Postop Assessment: no apparent nausea or vomiting Anesthetic complications: no     Last Vitals:  Vitals:   03/03/18 1413 03/03/18 1500  BP: (!) 134/55 (!) 125/48  Pulse: 78 80  Resp: 20 18  Temp: (!) 36.4 C 36.7 C  SpO2: 97% 100%    Last Pain:  Vitals:   03/03/18 1500  TempSrc: Oral  PainSc: 0-No pain                 Limmie Schoenberg S

## 2018-03-03 NOTE — Op Note (Signed)
[facility] Gastroenterology Patient Name: Robin Alexander Procedure Date: 03/03/2018 12:20 PM MRN: [id number] Account #: [id number] Date of Birth: 1933/02/16 Admit Type: Inpatient Age: 84 Room: [facility] ENDO ROOM 1 Gender: Female Note Status: Finalized Procedure:            Upper GI endoscopy Indications:          Iron deficiency anemia Providers:            Jonathon Bellows MD, MD Referring MD:         Angela Adam. Caryl Bis (Referring MD) Medicines:            Monitored Anesthesia Care Complications:        No immediate complications. Procedure:            Pre-Anesthesia Assessment:                       - Prior to the procedure, a History and Physical was                        performed, and patient medications, allergies and                        sensitivities were reviewed. The patient's tolerance of                        previous anesthesia was reviewed.                       - The risks and benefits of the procedure and the                        sedation options and risks were discussed with the                        patient. All questions were answered and informed                        consent was obtained.                       - ASA Grade Assessment: III - A patient with severe                        systemic disease.                       After obtaining informed consent, the endoscope was                        passed under direct vision. Throughout the procedure,                        the patient's blood pressure, pulse, and oxygen                        saturations were monitored continuously. The Endoscope                        was introduced through the mouth, and advanced to the  third part of duodenum. The upper GI endoscopy was                        accomplished with ease. The patient tolerated the                        procedure well. Findings:      The esophagus was normal.      Localized moderate inflammation  characterized by congestion (edema) and       erythema was found in the gastric antrum. Biopsies were taken with a       cold forceps for histology.      A large non-bleeding diverticulum was found in the third portion of the       duodenum.      Few non-bleeding superficial duodenal ulcers with a clean ulcer base       (Forrest Class III) were found in the entire duodenum. The largest       lesion was 6 mm in largest dimension. Biopsies were taken with a cold       forceps for histology.      The cardia and gastric fundus were normal on retroflexion. Impression:           - Normal esophagus.                       - Gastritis. Biopsied.                       - Non-bleeding duodenal diverticulum.                       - Multiple non-bleeding duodenal ulcers with a clean                        ulcer base (Forrest Class III). Biopsied. Recommendation:       - Await pathology results.                       - Perform a colonoscopy today.                       - Prilosec 40 mg daily for 6 weeks                       Check stool for H pylori                       Avoid all NSAID's Procedure Code(s):    --- Professional ---                       [phone], Esophagogastroduodenoscopy, flexible, transoral;                        with biopsy, single or multiple Diagnosis Code(s):    --- Professional ---                       K29.70, Gastritis, unspecified, without bleeding                       K26.9, Duodenal ulcer, unspecified as acute or chronic,  without hemorrhage or perforation                       D50.9, Iron deficiency anemia, unspecified                       K57.10, Diverticulosis of small intestine without                        perforation or abscess without bleeding CPT copyright 2017 American Medical Association. All rights reserved. The codes documented in this report are preliminary and upon coder review may  be revised to meet current compliance  requirements. Jonathon Bellows, MD Jonathon Bellows MD, MD 03/03/2018 12:35:48 PM This report has been signed electronically. Number of Addenda: 0 Note Initiated On: 03/03/2018 12:20 PM      [facility]

## 2018-03-03 NOTE — Transfer of Care (Signed)
Immediate Anesthesia Transfer of Care Note  Patient: Robin Alexander  Procedure(s) Performed: ESOPHAGOGASTRODUODENOSCOPY (EGD) WITH PROPOFOL (N/A ) COLONOSCOPY WITH PROPOFOL (N/A )  Patient Location: PACU and Endoscopy Unit  Anesthesia Type:General  Level of Consciousness: drowsy  Airway & Oxygen Therapy: Patient Spontanous Breathing and Patient connected to nasal cannula oxygen  Post-op Assessment: Report given to RN and Post -op Vital signs reviewed and stable  Post vital signs: Reviewed and stable  Last Vitals:  Vitals Value Taken Time  BP 108/60 03/03/2018  1:10 PM  Temp 35.9 C 03/03/2018  1:08 PM  Pulse 82 03/03/2018  1:11 PM  Resp 15 03/03/2018  1:11 PM  SpO2 100 % 03/03/2018  1:11 PM  Vitals shown include unvalidated device data.  Last Pain:  Vitals:   03/03/18 1308  TempSrc: Tympanic  PainSc: 0-No pain         Complications: No apparent anesthesia complications

## 2018-03-03 NOTE — Plan of Care (Signed)
  Problem: Education: Goal: Ability to identify signs and symptoms of gastrointestinal bleeding will improve Outcome: Progressing   Problem: Bowel/Gastric: Goal: Will show no signs and symptoms of gastrointestinal bleeding Outcome: Progressing   Problem: Fluid Volume: Goal: Will show no signs and symptoms of excessive bleeding Outcome: Progressing   Problem: Clinical Measurements: Goal: Complications related to the disease process, condition or treatment will be avoided or minimized Outcome: Progressing   Problem: Education: Goal: Knowledge of General Education information will improve Outcome: Progressing   Problem: Safety: Goal: Ability to remain free from injury will improve Outcome: Progressing   

## 2018-03-03 NOTE — H&P (Signed)
Jonathon Bellows, MD [address], [place], [place], [place], [address] [address], [place], [place], [place], [address] Phone: [phone]  Fax: [phone]  Primary Care Physician:  Leone Haven, MD   Pre-Procedure History & Physical: HPI:  Robin Alexander is a 84 y.o. female is here for an endoscopy and colonoscopy    Past Medical History:  Diagnosis Date  . Abnormal CT scan, chest 03/20/2014  . Arthritis    shoulders  . Chicken pox   . Complication of anesthesia 2016   slow to wake up after anesthesia  . Dyspnea    uses inhaler when she gets bronchitis  . GERD (gastroesophageal reflux disease)   . Heart murmur   . High cholesterol   . HTN (hypertension)   . Pneumonia    spring 2015    Past Surgical History:  Procedure Laterality Date  . ABDOMINAL HYSTERECTOMY     in 50's  . ANTERIOR LAT LUMBAR FUSION Right 02/28/2015   Procedure: ANTERIOR LATERAL LUMBAR FUSION 1 LEVEL;  Surgeon: Phylliss Bob, MD;  Location: [facility];  Service: Orthopedics;  Laterality: Right;  Right sided lumbar 3-4 lateral interbody fusion  . APPENDECTOMY  1950  . BACK SURGERY     rod in back  . BICEPT TENODESIS Right 11/20/2017   Procedure: BICEPS TENODESIS;  Surgeon: Leim Fabry, MD;  Location: [facility];  Service: Orthopedics;  Laterality: Right;  . CATARACT EXTRACTION W/ INTRAOCULAR LENS  IMPLANT, BILATERAL Bilateral 2011  . CESAREAN SECTION     2  . JOINT REPLACEMENT    . REVERSE SHOULDER ARTHROPLASTY Right 11/20/2017   Procedure: REVERSE SHOULDER ARTHROPLASTY;  Surgeon: Leim Fabry, MD;  Location: [facility];  Service: Orthopedics;  Laterality: Right;  . ROTATOR CUFF REPAIR Right    right shoulder   10 YRS  . TOTAL HIP ARTHROPLASTY Bilateral 2005, 2006   Bilateral, [place]    Prior to Admission medications   Medication Sig Start Date End Date Taking? Authorizing Provider  amLODipine-benazepril (LOTREL) 5-20 MG capsule TAKE 1 CAPSULE BY MOUTH  DAILY 01/27/18  Yes Leone Haven, MD  aspirin EC 325 MG EC tablet Take 1 tablet (325 mg total) by mouth daily. 11/21/17  Yes Lattie Corns, PA-C  Cyanocobalamin (VITAMIN B12 SL) Take 1-2 tablets by mouth 2 (two) times daily. Take 2 tablets in the morning and 1 tablet at night.   Yes [provider]  docusate sodium (COLACE) 100 MG capsule Take 100 mg by mouth daily.   Yes [provider]  loratadine (CLARITIN) 10 MG tablet Take 1 tablet (10 mg total) by mouth daily. 02/15/18  Yes Burnard Hawthorne, FNP  Magnesium 250 MG TABS Take 250 mg by mouth daily.   Yes [provider]  methocarbamol (ROBAXIN) 500 MG tablet Take 1 tablet (500 mg total) by mouth every 6 (six) hours as needed for muscle spasms. 11/20/17  Yes Lattie Corns, PA-C  Multiple Vitamin (MULTIVITAMIN WITH MINERALS) TABS tablet Take 1 tablet by mouth daily. CENTRUM SILVER   Yes [provider]  Omega-3 Fatty Acids (FISH OIL) 1000 MG CAPS Take 1,000-2,000 mg by mouth daily. TAKE 2 CAPSULES IN THE MORNING & 1 CAPSULE AT NIGHT   Yes [provider]  ranitidine (ZANTAC) 150 MG tablet Take 150 mg by mouth 2 (two) times daily.   Yes [provider]  simvastatin (ZOCOR) 20 MG tablet TAKE 1 TABLET BY MOUTH  EVERY MORNING Patient taking  differently: TAKE 1 TABLET BY MOUTH  EVERY EVENING 02/11/18  Yes Leone Haven, MD  traMADol (ULTRAM) 50 MG tablet Take 0.5 tablets (25 mg total) by mouth every 6 (six) hours as needed for severe pain. 11/25/17  Yes Sudini, Alveta Heimlich, MD  Turmeric 500 MG CAPS Take 500 mg by mouth daily.    Yes [provider]  ondansetron (ZOFRAN) 4 MG tablet Take 1 tablet (4 mg total) by mouth every 6 (six) hours as needed for nausea. Patient not taking: Reported on 03/02/2018 11/20/17   Lattie Corns, PA-C  triamcinolone (KENALOG) 0.025 % cream Apply 1 application topically 2 (two) times daily. Patient not taking: Reported on 03/02/2018 02/15/18   Burnard Hawthorne, FNP     Allergies as of 03/02/2018 - Review Complete 03/02/2018  Allergen Reaction Noted  . Adhesive [tape]  11/17/2017  . Codeine Itching 12/26/2011    Family History  Problem Relation Age of Onset  . Arthritis Mother   . Heart disease Mother   . Arthritis Father   . Stroke Brother   . Heart disease Brother   . Cancer Neg Hx     Social History   Socioeconomic History  . Marital status: Married    Spouse name: Not on file  . Number of children: 2  . Years of education: Not on file  . Highest education level: Not on file  Occupational History  . Occupation: Retired - Product/process development scientist: RETIRED  Social Needs  . Financial resource strain: Not hard at all  . Food insecurity:    Worry: Never true    Inability: Never true  . Transportation needs:    Medical: No    Non-medical: No  Tobacco Use  . Smoking status: Former Smoker    Packs/day: 0.75    Years: 20.00    Pack years: 15.00    Types: Cigarettes    Last attempt to quit: 09/02/1983    Years since quitting: 34.5  . Smokeless tobacco: Never Used  Substance and Sexual Activity  . Alcohol use: Yes    Alcohol/week: 0.0 oz    Comment: Occasional wine  . Drug use: No  . Sexual activity: Yes    Birth control/protection: Post-menopausal, Surgical  Lifestyle  . Physical activity:    Days per week: Not on file    Minutes per session: Not on file  . Stress: Not at all  Relationships  . Social connections:    Talks on phone: Patient refused    Gets together: Patient refused    Attends religious service: Patient refused    Active member of club or organization: Patient refused    Attends meetings of clubs or organizations: Patient refused    Relationship status: Patient refused  . Intimate partner violence:    Fear of current or ex partner: No    Emotionally abused: No    Physically abused: No    Forced sexual activity: No  Other Topics Concern  . Not on file  Social History Narrative   Regular Exercise -   Active but no regular exercise routine   Daily Caffeine Use:  NO          Review of Systems: See HPI, otherwise negative ROS  Physical Exam: BP (!) 149/60   Pulse 89   Temp (!) 97.3 F (36.3 C) (Tympanic)   Resp 18   Ht 4\' 9"  (1.448 m)   Wt 144 lb (65.3 kg)   SpO2 99%  BMI 31.16 kg/m  General:   Alert,  pleasant and cooperative in NAD Head:  Normocephalic and atraumatic. Neck:  Supple; no masses or thyromegaly. Lungs:  Clear throughout to auscultation, normal respiratory effort.    Heart:  +S1, +S2, Regular rate and rhythm, No edema. Abdomen:  Soft, nontender and nondistended. Normal bowel sounds, without guarding, and without rebound.   Neurologic:  Alert and  oriented x4;  grossly normal neurologically.  Impression/Plan: Robin Alexander is here for an endoscopy and colonoscopy  to be performed for  evaluation of iron deficiency anemia.     Risks, benefits, limitations, and alternatives regarding endoscopy have been reviewed with the patient.  Questions have been answered.  All parties agreeable.   Jonathon Bellows, MD  03/03/2018, 12:13 PM

## 2018-03-05 ENCOUNTER — Telehealth: Payer: Self-pay | Admitting: Family Medicine

## 2018-03-05 NOTE — Telephone Encounter (Signed)
430 on July 10

## 2018-03-05 NOTE — Telephone Encounter (Signed)
Transition Care Management Follow-up Telephone Call  How have you been since you were released from the hospital? Patient feeling better just weak.   Do you understand why you were in the hospital? yes   Do you understand the discharge instrcutions? yes  Items Reviewed:  Medications reviewed: yes  Allergies reviewed: yes  Dietary changes reviewed: yes  Referrals reviewed: yes   Functional Questionnaire:   Activities of Daily Living (ADLs):   She states they are independent in the following: ambulation, bathing and hygiene, feeding, continence, grooming, toileting and dressing States they require assistance with the following: No assistance needed   Any transportation issues/concerns?: no   Any patient concerns? no   Confirmed importance and date/time of follow-up visits scheduled: yes   Confirmed with patient if condition begins to worsen call PCP or go to the ER.  Patient was given the Call-a-Nurse line [phone]: yes

## 2018-03-05 NOTE — Telephone Encounter (Signed)
Patient was advised by Hospitalist to follow up with in one week need appointment.

## 2018-03-08 ENCOUNTER — Other Ambulatory Visit: Payer: Self-pay | Admitting: *Deleted

## 2018-03-08 ENCOUNTER — Encounter: Payer: Self-pay | Admitting: Gastroenterology

## 2018-03-08 LAB — SURGICAL PATHOLOGY

## 2018-03-08 NOTE — Patient Outreach (Signed)
[facility]) Care Management  03/08/2018  Robin Alexander May 04, 1933 [id number]  Referral via RED Alert-EMMI-General Discharge. Day# 1, 03/05/2018: Reason: Scheduled follow up? "no"  Telephone call to patient who was advised of reason for call.  HIPPA verification received.  Patient states she had called primary care office Friday to make appointment. States she left a message but has not gotten call back. Advised patient to call back tomorrow to inquire about follow up appointment. Patient voices understanding & states she will call back. States she is feeling well since discharge. Voices that she is taking medications as prescribed by her MD.  Voices that she will call office if she has abnormal symptoms & will call 911 or have spouse call 911 for emergency symptoms.  Emergency symptoms reviewed with patient who voices understanding.  Patient voices no further concerns. EMMI_Alert has been addressed.  Plan: Case closure.   Sherrin Daisy, RN BSN [place] Management Coordinator [facility] Care Management  [phone]

## 2018-03-08 NOTE — Telephone Encounter (Signed)
You could try Tuesday (03/09/2018) at noon if she is able to get here.

## 2018-03-08 NOTE — Telephone Encounter (Signed)
Appointment taken for previous ER follow up

## 2018-03-09 DIAGNOSIS — Z96611 Presence of right artificial shoulder joint: Secondary | ICD-10-CM | POA: Diagnosis not present

## 2018-03-09 DIAGNOSIS — M25611 Stiffness of right shoulder, not elsewhere classified: Secondary | ICD-10-CM | POA: Diagnosis not present

## 2018-03-09 NOTE — Telephone Encounter (Signed)
Sorry dr. Caryl Bis I could not reach patient.

## 2018-03-09 NOTE — Telephone Encounter (Signed)
Patient scheduled.

## 2018-03-09 NOTE — Telephone Encounter (Signed)
4:30 on 03/10/18. There is no one scheduled at that time. Thanks.

## 2018-03-10 ENCOUNTER — Encounter: Payer: Self-pay | Admitting: Gastroenterology

## 2018-03-10 ENCOUNTER — Ambulatory Visit (INDEPENDENT_AMBULATORY_CARE_PROVIDER_SITE_OTHER): Payer: Medicare Other | Admitting: Family Medicine

## 2018-03-10 ENCOUNTER — Encounter: Payer: Self-pay | Admitting: Family Medicine

## 2018-03-10 VITALS — BP 130/60 | HR 82 | Temp 98.3°F | Wt 142.6 lb

## 2018-03-10 DIAGNOSIS — D649 Anemia, unspecified: Secondary | ICD-10-CM | POA: Diagnosis not present

## 2018-03-10 DIAGNOSIS — K922 Gastrointestinal hemorrhage, unspecified: Secondary | ICD-10-CM | POA: Diagnosis not present

## 2018-03-10 NOTE — Patient Instructions (Signed)
Nice to see you. We will check your hemoglobin again today. Please follow-up with Dr. Vicente Males. Please continue your Prilosec.  If you develop blood in your stools, lightheadedness, chest pain, shortness of breath, abdominal pain, worsening fatigue, or any new or changing symptoms please seek medical attention immediately.

## 2018-03-11 DIAGNOSIS — M25611 Stiffness of right shoulder, not elsewhere classified: Secondary | ICD-10-CM | POA: Diagnosis not present

## 2018-03-11 DIAGNOSIS — Z96611 Presence of right artificial shoulder joint: Secondary | ICD-10-CM | POA: Diagnosis not present

## 2018-03-11 LAB — CBC
HCT: 29 % — ABNORMAL LOW (ref 36.0–46.0)
Hemoglobin: 9.4 g/dL — ABNORMAL LOW (ref 12.0–15.0)
MCHC: 32.4 g/dL (ref 30.0–36.0)
MCV: 83.2 fl (ref 78.0–100.0)
Platelets: 293 10*3/uL (ref 150.0–400.0)
RBC: 3.48 Mil/uL — ABNORMAL LOW (ref 3.87–5.11)
RDW: 17.7 % — AB (ref 11.5–15.5)
WBC: 9.2 10*3/uL (ref 4.0–10.5)

## 2018-03-12 ENCOUNTER — Other Ambulatory Visit: Payer: Self-pay

## 2018-03-12 ENCOUNTER — Telehealth: Payer: Self-pay

## 2018-03-12 DIAGNOSIS — K269 Duodenal ulcer, unspecified as acute or chronic, without hemorrhage or perforation: Secondary | ICD-10-CM

## 2018-03-12 MED ORDER — OMEPRAZOLE 40 MG PO CPDR: 40.0000 mg | DELAYED_RELEASE_CAPSULE | Freq: Two times a day (BID) | ORAL | 1 refills | Status: DC

## 2018-03-12 NOTE — Telephone Encounter (Signed)
-----   Message from Jonathon Bellows, MD sent at 03/10/2018 12:12 PM EDT ----- Robin Alexander  Inform severe gastritis and duodenitis- suggest Prilosec 40 mg BID for 6 weeks and repeat EGD after to check for healing of multiple ulcers seen   Leone Haven, MD    Dr Jonathon Bellows MD,MRCP [facility]) Gastroenterology/Hepatology Pager: [phone]

## 2018-03-12 NOTE — Telephone Encounter (Signed)
Pt notified of EGD results. Rx for Prilosec 40mg  has been sent to pt's pharmacy.   Dr. Vicente Males, she has a 2 week hospital follow up with you on Tuesday, 03/23/18. Does she still need to follow up with you if I discussed results and scheduled her for the repeat EGD?   EGD repeat scheduled for 04/22/18.

## 2018-03-13 NOTE — Telephone Encounter (Signed)
Yes she can follow up after the repeat EGD, no need to see earlier

## 2018-03-14 NOTE — Discharge Summary (Signed)
[facility] NAME: Robin Alexander    MR#:  [id number]  DATE OF BIRTH:  [birth year]  DATE OF ADMISSION:  03/02/2018 ADMITTING PHYSICIAN: Epifanio Lesches, MD  DATE OF DISCHARGE: 03/03/2018  5:10 PM  PRIMARY CARE PHYSICIAN: Leone Haven, MD   ADMISSION DIAGNOSIS:  Gastrointestinal hemorrhage, unspecified gastrointestinal hemorrhage type [K92.2]  DISCHARGE DIAGNOSIS:  Active Problems:   Symptomatic anemia   SECONDARY DIAGNOSIS:   Past Medical History:  Diagnosis Date  . Abnormal CT scan, chest 03/20/2014  . Arthritis    shoulders  . Chicken pox   . Complication of anesthesia 2016   slow to wake up after anesthesia  . Dyspnea    uses inhaler when she gets bronchitis  . GERD (gastroesophageal reflux disease)   . Heart murmur   . High cholesterol   . HTN (hypertension)   . Pneumonia    spring 2015     ADMITTING HISTORY  HISTORY OF PRESENT ILLNESS:  Robin Alexander  is a 84 y.o. female with a known history of recent right shoulder surgery for arthroplasty, recent admission for right foot drop came in because of abnormal labs with low hemoglobin.  Patient went to Dr. Candiss Norse for annual checkup, had blood work with hemoglobin 6.3 and patient was sent to PCP, we will refer the patient to ER.  Patient denies any black stool.  But complaining of fatigue recently.  No shortness of breath or chest pain.  Global 6.9 on blood work today.  Last hemoglobin was 8.9 in March 2019.  HOSPITAL COURSE:   *Symptomatic anemia secondary to chronic GI blood losses Patient was admitted to medical floor.  Transfused 1 unit packed RBC.  Patient had EGD and colonoscopy.  EGD showed gastritis and nonbleeding duodenal ulcers.  Diverticulosis on colonoscopy.  With no acute bleeding and improved hemoglobin patient is being discharged home with PPIs.  Follow-up with primary care physician and GI as outpatient.  Iron supplements given.  Other comorbidities  remained stable.  CONSULTS OBTAINED:    DRUG ALLERGIES:   Allergies  Allergen Reactions  . Adhesive [Tape]     Paper tape okay  . Codeine Itching    DISCHARGE MEDICATIONS:   Allergies as of 03/03/2018      Reactions   Adhesive [tape]    Paper tape okay   Codeine Itching      Medication List    TAKE these medications   amLODipine-benazepril 5-20 MG capsule Commonly known as:  LOTREL TAKE 1 CAPSULE BY MOUTH  DAILY   aspirin 325 MG EC tablet Take 1 tablet (325 mg total) by mouth daily.   docusate sodium 100 MG capsule Commonly known as:  COLACE Take 100 mg by mouth daily.   ferrous sulfate 325 (65 FE) MG EC tablet Take 1 tablet (325 mg total) by mouth 2 (two) times daily with a meal.   Fish Oil 1000 MG Caps Take 1,000-2,000 mg by mouth daily. TAKE 2 CAPSULES IN THE MORNING & 1 CAPSULE AT NIGHT   Magnesium 250 MG Tabs Take 250 mg by mouth daily.   multivitamin with minerals Tabs tablet Take 1 tablet by mouth daily. CENTRUM SILVER   pantoprazole 40 MG tablet Commonly known as:  PROTONIX Take 1 tablet (40 mg total) by mouth 2 (two) times daily before a meal.   ranitidine 150 MG tablet Commonly known as:  ZANTAC Take 150 mg by mouth 2 (two) times daily.   simvastatin 20  MG tablet Commonly known as:  ZOCOR TAKE 1 TABLET BY MOUTH  EVERY MORNING What changed:    how much to take  how to take this  when to take this   Turmeric 500 MG Caps Take 500 mg by mouth daily.   VITAMIN B12 SL Take 1-2 tablets by mouth 2 (two) times daily. Take 2 tablets in the morning and 1 tablet at night.       Today   VITAL SIGNS:  Blood pressure (!) 125/48, pulse 80, temperature 98 F (36.7 C), temperature source Oral, resp. rate 18, height 4\' 9"  (1.448 m), weight 65.3 kg (144 lb), SpO2 100 %.  I/O:  No intake or output data in the 24 hours ending 03/14/18 1711  PHYSICAL EXAMINATION:  Physical Exam  GENERAL:  84 y.o.-year-old patient lying in the bed with no acute  distress.  LUNGS: Normal breath sounds bilaterally, no wheezing, rales,rhonchi or crepitation. No use of accessory muscles of respiration.  CARDIOVASCULAR: S1, S2 normal. No murmurs, rubs, or gallops.  ABDOMEN: Soft, non-tender, non-distended. Bowel sounds present. No organomegaly or mass.  NEUROLOGIC: Moves all 4 extremities. PSYCHIATRIC: The patient is alert and oriented x 3.  SKIN: No obvious rash, lesion, or ulcer.   DATA REVIEW:   CBC Recent Labs  Lab 03/10/18 1702  WBC 9.2  HGB 9.4*  HCT 29.0*  PLT 293.0    Chemistries  No results for input(s): NA, K, CL, CO2, GLUCOSE, BUN, CREATININE, CALCIUM, MG, AST, ALT, ALKPHOS, BILITOT in the last 168 hours.  Invalid input(s): GFRCGP  Cardiac Enzymes No results for input(s): TROPONINI in the last 168 hours.  Microbiology Results  Results for orders placed or performed during the hospital encounter of 11/17/17  Urine culture     Status: Abnormal   Collection Time: 11/17/17  4:16 PM  Result Value Ref Range Status   Specimen Description   Final    URINE, CLEAN CATCH Performed at [facility], [address] [place], [place] [address]    Special Requests   Final    NONE Performed at [facility], [address] [place], [place] [address]    Culture >=100,000 COLONIES/mL ESCHERICHIA COLI (A)  Final   Report Status 11/19/2017 FINAL  Final   Organism ID, Bacteria ESCHERICHIA COLI (A)  Final      Susceptibility   Escherichia coli - MIC*    AMPICILLIN <=2 SENSITIVE Sensitive     CEFAZOLIN <=4 SENSITIVE Sensitive     CEFTRIAXONE <=1 SENSITIVE Sensitive     CIPROFLOXACIN <=0.25 SENSITIVE Sensitive     GENTAMICIN <=1 SENSITIVE Sensitive     IMIPENEM <=0.25 SENSITIVE Sensitive     NITROFURANTOIN <=16 SENSITIVE Sensitive     TRIMETH/SULFA <=20 SENSITIVE Sensitive     AMPICILLIN/SULBACTAM <=2 SENSITIVE Sensitive     PIP/TAZO <=4 SENSITIVE Sensitive     Extended ESBL NEGATIVE Sensitive     * >=100,000  COLONIES/mL ESCHERICHIA COLI  Surgical pcr screen     Status: None   Collection Time: 11/17/17  4:16 PM  Result Value Ref Range Status   MRSA, PCR NEGATIVE NEGATIVE Final   Staphylococcus aureus NEGATIVE NEGATIVE Final    Comment: (NOTE) The Xpert SA Assay (FDA approved for NASAL specimens in patients 61 years of age and older), is one component of a comprehensive surveillance program. It is not intended to diagnose infection nor to guide or monitor treatment. Performed at [facility], [address], [place], [place] [address]  RADIOLOGY:  No results found.  Follow up with PCP in 1 week.  Management plans discussed with the patient, family and they are in agreement.  CODE STATUS:  Code Status History    Date Active Date Inactive Code Status Order ID Comments User Context   03/02/2018 1406 03/03/2018 2016 Full Code [id number]  Epifanio Lesches, MD ED   11/20/2017 1231 11/21/2017 1529 Full Code [id number]  Leim Fabry, MD Inpatient   02/28/2015 2315 03/01/2015 2029 Full Code [id number]  McKenzie, Lennie Muckle, PA-C Inpatient    Advance Directive Documentation     Most Recent Value  Type of Advance Directive  Living will  Pre-existing out of facility DNR order (yellow form or pink MOST form)  -  "MOST" Form in Place?  -      TOTAL TIME TAKING CARE OF THIS PATIENT ON DAY OF DISCHARGE: more than 30 minutes.   Neita Carp M.D on 03/14/2018 at 5:11 PM  Between 7am to 6pm - Pager - [phone]  After 6pm go to [email or web address] - password [email or web address] [facility]  Office  [phone]  CC: Primary care physician; Leone Haven, MD  Note: This dictation was prepared with Dragon dictation along with smaller phrase technology. Any transcriptional errors that result from this process are unintentional.

## 2018-03-15 DIAGNOSIS — Z96611 Presence of right artificial shoulder joint: Secondary | ICD-10-CM | POA: Diagnosis not present

## 2018-03-15 NOTE — Telephone Encounter (Signed)
Left vm for pt that she can reschedule her appt with Dr. Vicente Males on 03/23/18 and reschedule follow up until after 04/22/18 EGD.

## 2018-03-16 ENCOUNTER — Other Ambulatory Visit
Admission: RE | Admit: 2018-03-16 | Discharge: 2018-03-16 | Disposition: A | Discharge status: Home / Self Care | Payer: Medicare Other | Source: Ambulatory Visit | Attending: Gastroenterology | Admitting: Gastroenterology

## 2018-03-16 ENCOUNTER — Telehealth: Payer: Self-pay | Admitting: Family Medicine

## 2018-03-16 ENCOUNTER — Telehealth: Payer: Self-pay

## 2018-03-16 DIAGNOSIS — K922 Gastrointestinal hemorrhage, unspecified: Secondary | ICD-10-CM

## 2018-03-16 DIAGNOSIS — K269 Duodenal ulcer, unspecified as acute or chronic, without hemorrhage or perforation: Secondary | ICD-10-CM | POA: Insufficient documentation

## 2018-03-16 DIAGNOSIS — R197 Diarrhea, unspecified: Secondary | ICD-10-CM | POA: Diagnosis present

## 2018-03-16 DIAGNOSIS — D649 Anemia, unspecified: Secondary | ICD-10-CM

## 2018-03-16 HISTORY — DX: Gastrointestinal hemorrhage, unspecified: K92.2

## 2018-03-16 NOTE — Telephone Encounter (Signed)
-----   Message from Leone Haven, MD sent at 03/14/2018 11:05 AM EDT ----- Please let the patient know that her anemia is slightly improved. Need to recheck this in two weeks. Please place an order for a CBC for a diagnosis of anemia. Thanks.

## 2018-03-16 NOTE — Progress Notes (Signed)
  Tommi Rumps, MD Phone: [phone]  Robin Alexander is a 84 y.o. female who presents today for f/u.  CC: hospital f/u, anemia  Patient was admitted to the hospital for anemia and GI bleed from 03/02/18-03/03/18.  She was found to be anemic through nephrology labs.  They contacted [place] regarding this and I advised emergency room evaluation.  The was transfused 1 unit of packed red blood cells.  She underwent EGD and colonoscopy which revealed angiectasia of the colon that was not bleeding also revealed gastritis and duodenal ulcers.  They placed her on Protonix twice daily.  She has had no shortness of breath, chest pain, or bright red blood in her stool.  She notes her stools are black though they are not tarry.  She notes she was advised that they would be that color given that she is on iron now.  She has had no stomach pain.  No lightheadedness.  Does have some fatigue.  She sees GI for follow-up this coming week.  Discharge summary reviewed.  Medications reviewed.  Social History   Tobacco Use  Smoking Status Former Smoker  . Packs/day: 0.75  . Years: 20.00  . Pack years: 15.00  . Types: Cigarettes  . Last attempt to quit: 09/02/1983  . Years since quitting: 34.5  Smokeless Tobacco Never Used     ROS see history of present illness  Objective  Physical Exam Vitals:   03/10/18 1623  BP: 130/60  Pulse: 82  Temp: 98.3 F (36.8 C)  SpO2: 98%    BP Readings from Last 3 Encounters:  03/10/18 130/60  03/03/18 (!) 125/48  02/15/18 (!) 138/54   Wt Readings from Last 3 Encounters:  03/10/18 142 lb 9.6 oz (64.7 kg)  03/03/18 144 lb (65.3 kg)  02/15/18 146 lb 2 oz (66.3 kg)    Physical Exam  Constitutional: No distress.  Cardiovascular: Normal rate, regular rhythm and normal heart sounds.  Pulmonary/Chest: Effort normal and breath sounds normal.  Abdominal: Soft. Bowel sounds are normal. She exhibits no distension. There is no tenderness.  Musculoskeletal: She exhibits no  edema.  Neurological: She is alert.  Skin: Skin is warm and dry. She is not diaphoretic.     Assessment/Plan: Please see individual problem list.  Symptomatic anemia Patient currently with some fatigue likely related to this.  Hemoglobin had trended up after transfusion.  We will recheck that.  I did discuss checking a stool guaiac given her black-colored stools though she declined this.  She will continue Protonix.  She will see GI in follow-up.  He is given return precautions.  GI bleed No acute bleeding.  Underwent EGD and colonoscopy.  Currently on Protonix and iron supplement.  She will see GI as planned.   Orders Placed This Encounter  Procedures  . CBC    No orders of the defined types were placed in this encounter.    Tommi Rumps, MD [place]

## 2018-03-16 NOTE — Assessment & Plan Note (Signed)
No acute bleeding.  Underwent EGD and colonoscopy.  Currently on Protonix and iron supplement.  She will see GI as planned.

## 2018-03-16 NOTE — Telephone Encounter (Signed)
Charted in result notes. 

## 2018-03-16 NOTE — Assessment & Plan Note (Signed)
Patient currently with some fatigue likely related to this.  Hemoglobin had trended up after transfusion.  We will recheck that.  I did discuss checking a stool guaiac given her black-colored stools though she declined this.  She will continue Protonix.  She will see GI in follow-up.  He is given return precautions.

## 2018-03-16 NOTE — Telephone Encounter (Signed)
Copied from [place] [id number]. Topic: Quick Communication - Lab Results >> Mar 16, 2018 11:40 AM Juanda Chance, CMA wrote: Left message to return call, ok for [facility] to give results and speak to patient and schedule non fasting lab appointment  Patient is calling back and states she missed a call . Please call patient CB# [phone]

## 2018-03-17 DIAGNOSIS — M25611 Stiffness of right shoulder, not elsewhere classified: Secondary | ICD-10-CM | POA: Diagnosis not present

## 2018-03-17 DIAGNOSIS — Z96611 Presence of right artificial shoulder joint: Secondary | ICD-10-CM | POA: Diagnosis not present

## 2018-03-17 LAB — H. PYLORI ANTIGEN, STOOL: H. Pylori Stool Ag, Eia: NEGATIVE

## 2018-03-22 ENCOUNTER — Encounter: Payer: Self-pay | Admitting: Gastroenterology

## 2018-03-22 DIAGNOSIS — M25611 Stiffness of right shoulder, not elsewhere classified: Secondary | ICD-10-CM | POA: Diagnosis not present

## 2018-03-22 DIAGNOSIS — Z96611 Presence of right artificial shoulder joint: Secondary | ICD-10-CM | POA: Diagnosis not present

## 2018-03-23 ENCOUNTER — Ambulatory Visit: Payer: Medicare Other | Admitting: Gastroenterology

## 2018-03-24 DIAGNOSIS — Z96611 Presence of right artificial shoulder joint: Secondary | ICD-10-CM | POA: Diagnosis not present

## 2018-03-24 DIAGNOSIS — M25611 Stiffness of right shoulder, not elsewhere classified: Secondary | ICD-10-CM | POA: Diagnosis not present

## 2018-03-30 ENCOUNTER — Other Ambulatory Visit (INDEPENDENT_AMBULATORY_CARE_PROVIDER_SITE_OTHER): Payer: Medicare Other

## 2018-03-30 DIAGNOSIS — Z96611 Presence of right artificial shoulder joint: Secondary | ICD-10-CM | POA: Diagnosis not present

## 2018-03-30 DIAGNOSIS — D649 Anemia, unspecified: Secondary | ICD-10-CM | POA: Diagnosis not present

## 2018-03-30 LAB — CBC
HEMATOCRIT: 32.2 % — AB (ref 36.0–46.0)
Hemoglobin: 10.6 g/dL — ABNORMAL LOW (ref 12.0–15.0)
MCHC: 33 g/dL (ref 30.0–36.0)
MCV: 86.4 fl (ref 78.0–100.0)
PLATELETS: 197 10*3/uL (ref 150.0–400.0)
RBC: 3.73 Mil/uL — AB (ref 3.87–5.11)
RDW: 24.1 % — ABNORMAL HIGH (ref 11.5–15.5)
WBC: 7 10*3/uL (ref 4.0–10.5)

## 2018-03-31 ENCOUNTER — Telehealth: Payer: Self-pay

## 2018-03-31 ENCOUNTER — Telehealth: Payer: Self-pay | Admitting: Family Medicine

## 2018-03-31 DIAGNOSIS — D649 Anemia, unspecified: Secondary | ICD-10-CM

## 2018-03-31 NOTE — Telephone Encounter (Signed)
-----   Message from Leone Haven, MD sent at 03/31/2018 10:17 AM EDT ----- Please let the patient know her hemoglobin is trending up.  We should recheck this in 1 to 2 months.  Please place an order for a CBC for anemia.  Thanks.

## 2018-03-31 NOTE — Telephone Encounter (Signed)
Lab results given and documented in result note 

## 2018-03-31 NOTE — Telephone Encounter (Signed)
Copied from [place] [id number]. Topic: Quick Communication - Lab Results >> Mar 31, 2018 10:45 AM Juanda Chance, CMA wrote: Left message to return call, ok for [facility] to give results and speak to patient and schedule lab appointment

## 2018-04-01 DIAGNOSIS — M25611 Stiffness of right shoulder, not elsewhere classified: Secondary | ICD-10-CM | POA: Diagnosis not present

## 2018-04-01 DIAGNOSIS — Z96611 Presence of right artificial shoulder joint: Secondary | ICD-10-CM | POA: Diagnosis not present

## 2018-04-06 DIAGNOSIS — M25611 Stiffness of right shoulder, not elsewhere classified: Secondary | ICD-10-CM | POA: Diagnosis not present

## 2018-04-06 DIAGNOSIS — Z96611 Presence of right artificial shoulder joint: Secondary | ICD-10-CM | POA: Diagnosis not present

## 2018-04-08 DIAGNOSIS — Z96611 Presence of right artificial shoulder joint: Secondary | ICD-10-CM | POA: Diagnosis not present

## 2018-04-08 DIAGNOSIS — M25611 Stiffness of right shoulder, not elsewhere classified: Secondary | ICD-10-CM | POA: Diagnosis not present

## 2018-04-14 ENCOUNTER — Encounter: Payer: Self-pay | Admitting: Family Medicine

## 2018-04-14 ENCOUNTER — Ambulatory Visit (INDEPENDENT_AMBULATORY_CARE_PROVIDER_SITE_OTHER): Payer: Medicare Other | Admitting: Family Medicine

## 2018-04-14 DIAGNOSIS — I10 Essential (primary) hypertension: Secondary | ICD-10-CM

## 2018-04-14 DIAGNOSIS — K922 Gastrointestinal hemorrhage, unspecified: Secondary | ICD-10-CM | POA: Diagnosis not present

## 2018-04-14 DIAGNOSIS — M25611 Stiffness of right shoulder, not elsewhere classified: Secondary | ICD-10-CM | POA: Diagnosis not present

## 2018-04-14 DIAGNOSIS — M19011 Primary osteoarthritis, right shoulder: Secondary | ICD-10-CM | POA: Diagnosis not present

## 2018-04-14 DIAGNOSIS — E785 Hyperlipidemia, unspecified: Secondary | ICD-10-CM | POA: Diagnosis not present

## 2018-04-14 DIAGNOSIS — I350 Nonrheumatic aortic (valve) stenosis: Secondary | ICD-10-CM

## 2018-04-14 DIAGNOSIS — Z96611 Presence of right artificial shoulder joint: Secondary | ICD-10-CM | POA: Diagnosis not present

## 2018-04-14 MED ORDER — FERROUS SULFATE 325 (65 FE) MG PO TBEC: 325.0000 mg | DELAYED_RELEASE_TABLET | Freq: Two times a day (BID) | ORAL | 0 refills | Status: DC

## 2018-04-14 NOTE — Patient Instructions (Signed)
Nice to see you. Please continue your iron supplement.  I sent a refill to your pharmacy. Please follow-up with GI as planned.

## 2018-04-14 NOTE — Assessment & Plan Note (Signed)
Discussed Tylenol for discomfort.  Advised against NSAID use given GI bleed.

## 2018-04-14 NOTE — Progress Notes (Signed)
  Tommi Rumps, MD Phone: [phone]  Robin Alexander is a 84 y.o. female who presents today for f/u.  CC: htn, hld, GI bleed, OA  HYPERTENSION  Disease Monitoring  Home BP Monitoring not checking Chest pain- no    Dyspnea- no Medications  Compliance-  Taking amlodipine, benazepril.   Edema- no  HYPERLIPIDEMIA Symptoms Chest pain on exertion:  no   Medications: Compliance- taking simvastatin Right upper quadrant pain- no  Muscle aches- no  OA: Patient notes scattered arthritic pains.  She has been taking Tylenol as needed for this.  Tumeric has not been beneficial.  GI bleed: She is doing well with this.  No abdominal pain.  No melena or blood in her stool.  She is taking Prilosec.  She ran out of her iron supplement.  She is scheduled for a repeat EGD.  Her energy has gotten better.    Social History   Tobacco Use  Smoking Status Former Smoker  . Packs/day: 0.75  . Years: 20.00  . Pack years: 15.00  . Types: Cigarettes  . Last attempt to quit: 09/02/1983  . Years since quitting: 34.6  Smokeless Tobacco Never Used     ROS see history of present illness  Objective  Physical Exam Vitals:   04/14/18 0956  BP: 132/60  Pulse: 81  Temp: 98.1 F (36.7 C)  SpO2: 97%    BP Readings from Last 3 Encounters:  04/14/18 132/60  03/10/18 130/60  03/03/18 (!) 125/48   Wt Readings from Last 3 Encounters:  04/14/18 144 lb 12.8 oz (65.7 kg)  03/10/18 142 lb 9.6 oz (64.7 kg)  03/03/18 144 lb (65.3 kg)    Physical Exam  Constitutional: No distress.  Cardiovascular: Normal rate and regular rhythm. Exam reveals no gallop and no friction rub.  Murmur (3/6 RUSB systolic murmur) heard. Pulmonary/Chest: Effort normal and breath sounds normal.  Musculoskeletal: She exhibits no edema.  Neurological: She is alert.  Skin: Skin is warm and dry. She is not diaphoretic.     Assessment/Plan: Please see individual problem list.  Moderate aortic stenosis She will continue  to see cardiology.  This is stable on most recent echo.  Hypertension Well-controlled.  Continue current regimen.  GI bleed Has improved quite a bit.  Hemoglobin has been trending up.  We will have her continue her iron supplement.  She will continue on Prilosec.  She will see GI as planned.  Advised against NSAID use.  She will remain off of aspirin at this time.  Hyperlipidemia Continue simvastatin.  Localized osteoarthritis of right shoulder Discussed Tylenol for discomfort.  Advised against NSAID use given GI bleed.   No orders of the defined types were placed in this encounter.   Meds ordered this encounter  Medications  . ferrous sulfate 325 (65 FE) MG EC tablet    Sig: Take 1 tablet (325 mg total) by mouth 2 (two) times daily with a meal.    Dispense:  60 tablet    Refill:  0     Tommi Rumps, MD [place]

## 2018-04-14 NOTE — Assessment & Plan Note (Signed)
Well-controlled.  Continue current regimen. 

## 2018-04-14 NOTE — Assessment & Plan Note (Addendum)
Has improved quite a bit.  Hemoglobin has been trending up.  We will have her continue her iron supplement.  She will continue on Prilosec.  She will see GI as planned.  Advised against NSAID use.  She will remain off of aspirin at this time.

## 2018-04-14 NOTE — Assessment & Plan Note (Signed)
Continue simvastatin. 

## 2018-04-14 NOTE — Assessment & Plan Note (Signed)
She will continue to see cardiology.  This is stable on most recent echo.

## 2018-04-21 ENCOUNTER — Telehealth: Payer: Self-pay

## 2018-04-21 DIAGNOSIS — Z96611 Presence of right artificial shoulder joint: Secondary | ICD-10-CM | POA: Diagnosis not present

## 2018-04-21 DIAGNOSIS — M25611 Stiffness of right shoulder, not elsewhere classified: Secondary | ICD-10-CM | POA: Diagnosis not present

## 2018-04-21 NOTE — Telephone Encounter (Signed)
Patient contacted office states she did not stop her Fish Oil or Iron Supplements for her EGD tomorrow.  She has been rescheduled from tomorrow to 04/26/18.  Thanks [employer]

## 2018-04-26 ENCOUNTER — Encounter
Admission: RE | Disposition: A | Discharge status: Home / Self Care | Payer: Self-pay | Source: Ambulatory Visit | Attending: Gastroenterology

## 2018-04-26 ENCOUNTER — Ambulatory Visit: Payer: Medicare Other | Admitting: Anesthesiology

## 2018-04-26 ENCOUNTER — Ambulatory Visit
Admission: RE | Admit: 2018-04-26 | Discharge: 2018-04-26 | Disposition: A | Discharge status: Home / Self Care | Payer: Medicare Other | Source: Ambulatory Visit | Attending: Gastroenterology | Admitting: Gastroenterology

## 2018-04-26 DIAGNOSIS — Z87891 Personal history of nicotine dependence: Secondary | ICD-10-CM | POA: Diagnosis not present

## 2018-04-26 DIAGNOSIS — E7800 Pure hypercholesterolemia, unspecified: Secondary | ICD-10-CM | POA: Diagnosis not present

## 2018-04-26 DIAGNOSIS — K5710 Diverticulosis of small intestine without perforation or abscess without bleeding: Secondary | ICD-10-CM | POA: Insufficient documentation

## 2018-04-26 DIAGNOSIS — K263 Acute duodenal ulcer without hemorrhage or perforation: Secondary | ICD-10-CM | POA: Diagnosis not present

## 2018-04-26 DIAGNOSIS — I10 Essential (primary) hypertension: Secondary | ICD-10-CM | POA: Insufficient documentation

## 2018-04-26 DIAGNOSIS — D649 Anemia, unspecified: Secondary | ICD-10-CM | POA: Insufficient documentation

## 2018-04-26 DIAGNOSIS — K219 Gastro-esophageal reflux disease without esophagitis: Secondary | ICD-10-CM | POA: Insufficient documentation

## 2018-04-26 DIAGNOSIS — Z79899 Other long term (current) drug therapy: Secondary | ICD-10-CM | POA: Insufficient documentation

## 2018-04-26 DIAGNOSIS — K449 Diaphragmatic hernia without obstruction or gangrene: Secondary | ICD-10-CM | POA: Insufficient documentation

## 2018-04-26 DIAGNOSIS — K269 Duodenal ulcer, unspecified as acute or chronic, without hemorrhage or perforation: Secondary | ICD-10-CM

## 2018-04-26 HISTORY — DX: Duodenitis with bleeding: K29.81

## 2018-04-26 HISTORY — PX: ESOPHAGOGASTRODUODENOSCOPY (EGD) WITH PROPOFOL: SHX5813

## 2018-04-26 HISTORY — DX: Anemia, unspecified: D64.9

## 2018-04-26 HISTORY — DX: Duodenal ulcer, unspecified as acute or chronic, without hemorrhage or perforation: K26.9

## 2018-04-26 SURGERY — ESOPHAGOGASTRODUODENOSCOPY (EGD) WITH PROPOFOL
Anesthesia: General

## 2018-04-26 MED ORDER — SODIUM CHLORIDE 0.9 % IV SOLN
INTRAVENOUS | Status: DC
  Administered 2018-04-26: 10:00:00 via INTRAVENOUS

## 2018-04-26 MED ORDER — LIDOCAINE HCL (CARDIAC) PF 100 MG/5ML IV SOSY
PREFILLED_SYRINGE | INTRAVENOUS | Status: DC | PRN
  Administered 2018-04-26: 100 mg via INTRAVENOUS

## 2018-04-26 MED ORDER — PROPOFOL 10 MG/ML IV BOLUS
INTRAVENOUS | Status: DC | PRN
  Administered 2018-04-26: 60 mg via INTRAVENOUS
  Administered 2018-04-26: 30 mg via INTRAVENOUS
  Administered 2018-04-26: 40 mg via INTRAVENOUS

## 2018-04-26 NOTE — Transfer of Care (Signed)
Immediate Anesthesia Transfer of Care Note  Patient: Robin Alexander  Procedure(s) Performed: ESOPHAGOGASTRODUODENOSCOPY (EGD) WITH PROPOFOL (N/A )  Patient Location: Endoscopy Unit  Anesthesia Type:General  Level of Consciousness: drowsy and patient cooperative  Airway & Oxygen Therapy: Patient Spontanous Breathing and Patient connected to nasal cannula oxygen  Post-op Assessment: Report given to RN and Post -op Vital signs reviewed and stable  Post vital signs: Reviewed and stable  Last Vitals:  Vitals Value Taken Time  BP 119/52 04/26/2018 11:00 AM  Temp 36.1 C 04/26/2018 11:00 AM  Pulse 71 04/26/2018 11:02 AM  Resp 17 04/26/2018 11:02 AM  SpO2 100 % 04/26/2018 11:02 AM  Vitals shown include unvalidated device data.  Last Pain:  Vitals:   04/26/18 1100  TempSrc: Tympanic  PainSc: 0-No pain         Complications: No apparent anesthesia complications

## 2018-04-26 NOTE — H&P (Signed)
Robin Bellows, MD [address] [place], [place], [place], [address] [address], [place], [place], [place], [address] Phone: [phone]  Fax: [phone]  Primary Care Physician:  Robin Haven, MD   Pre-Procedure History & Physical: HPI:  [name] is a 84 y.o. female is here for an endoscopy    Past Medical History:  Diagnosis Date  . Abnormal CT scan, chest 03/20/2014  . Anemia   . Arthritis    shoulders  . Chicken pox   . Complication of anesthesia 2016   slow to wake up after anesthesia  . Dyspnea    uses inhaler when she gets bronchitis  . GERD (gastroesophageal reflux disease)   . Heart murmur   . High cholesterol   . HTN (hypertension)   . Multiple duodenal ulcers   . Pneumonia    spring 2015    Past Surgical History:  Procedure Laterality Date  . ABDOMINAL HYSTERECTOMY     in 50's  . ANTERIOR LAT LUMBAR FUSION Right 02/28/2015   Procedure: ANTERIOR LATERAL LUMBAR FUSION 1 LEVEL;  Surgeon: Robin Bob, MD;  Location: [facility];  Service: Orthopedics;  Laterality: Right;  Right sided lumbar 3-4 lateral interbody fusion  . APPENDECTOMY  1950  . BACK SURGERY     rod in back  . BICEPT TENODESIS Right 11/20/2017   Procedure: BICEPS TENODESIS;  Surgeon: Robin Fabry, MD;  Location: [facility];  Service: Orthopedics;  Laterality: Right;  . CATARACT EXTRACTION W/ INTRAOCULAR LENS  IMPLANT, BILATERAL Bilateral 2011  . CESAREAN SECTION     2  . COLONOSCOPY WITH PROPOFOL N/A 03/03/2018   Procedure: COLONOSCOPY WITH PROPOFOL;  Surgeon: Robin Bellows, MD;  Location: [facility] END[facility]COPY;  Service: Gastroenterology;  Laterality: N/A;  . ESOPHAGOGASTRODUODENOSCOPY (EGD) WITH PROPOFOL N/A 03/03/2018   Procedure: ESOPHAGOGASTRODUODENOSCOPY (EGD) WITH PROPOFOL;  Surgeon: Robin Bellows, MD;  Location: [facility];  Service: Gastroenterology;  Laterality: N/A;  . JOINT REPLACEMENT    . REVERSE SHOULDER ARTHROPLASTY Right 11/20/2017   Procedure: REVERSE SHOULDER  ARTHROPLASTY;  Surgeon: Robin Fabry, MD;  Location: [facility];  Service: Orthopedics;  Laterality: Right;  . ROTATOR CUFF REPAIR Right    right shoulder   10 YRS  . TOTAL HIP ARTHROPLASTY Bilateral 2005, 2006   Bilateral, [place]    Prior to Admission medications   Medication Sig Start Date End Date Taking? Authorizing Provider  Acetaminophen (TYLENOL ARTHRITIS PAIN PO) Take by mouth 2 (two) times daily as needed.   Yes [provider]  amLODipine-benazepril (LOTREL) 5-20 MG capsule TAKE 1 CAPSULE BY MOUTH  DAILY 01/27/18  Yes Robin Haven, MD  cholecalciferol (VITAMIN D) 1000 units tablet Take 1,000 Units by mouth daily.   Yes [provider]  docusate sodium (COLACE) 100 MG capsule Take 100 mg by mouth daily.   Yes [provider]  ferrous sulfate 325 (65 FE) MG EC tablet Take 1 tablet (325 mg total) by mouth 2 (two) times daily with a meal. 04/14/18 05/14/18 Yes Robin Haven, MD  Multiple Vitamin (MULTIVITAMIN WITH MINERALS) TABS tablet Take 1 tablet by mouth daily. CENTRUM SILVER   Yes [provider]  Omega-3 Fatty Acids (FISH OIL) 1000 MG CAPS Take 1,000-2,000 mg by mouth daily. TAKE 2 CAPSULES IN THE MORNING & 1 CAPSULE AT NIGHT   Yes [provider]  omeprazole (PRILOSEC) 40 MG capsule Take 1 capsule (40 mg total) by mouth 2 (two) times daily. 03/12/18  Yes Robin Bellows,  MD  simvastatin (ZOCOR) 20 MG tablet TAKE 1 TABLET BY MOUTH  EVERY MORNING Patient taking differently: TAKE 1 TABLET BY MOUTH  EVERY EVENING 02/11/18  Yes Robin Haven, MD    Allergies as of 03/15/2018 - Review Complete 03/10/2018  Allergen Reaction Noted  . Adhesive [tape]  11/17/2017  . Codeine Itching 12/26/2011    Family History  Problem Relation Age of Onset  . Arthritis Mother   . Heart disease Mother   . Arthritis Father   . Stroke Brother   . Heart disease Brother   . Cancer Neg Hx     Social History   Socioeconomic History  . Marital  status: Married    Spouse name: Not on file  . Number of children: 2  . Years of education: Not on file  . Highest education level: Not on file  Occupational History  . Occupation: Retired - Product/process development scientist: RETIRED  Social Needs  . Financial resource strain: Not hard at all  . Food insecurity:    Worry: Never true    Inability: Never true  . Transportation needs:    Medical: No    Non-medical: No  Tobacco Use  . Smoking status: Former Smoker    Packs/day: 0.75    Years: 20.00    Pack years: 15.00    Types: Cigarettes    Last attempt to quit: 09/02/1983    Years since quitting: 34.6  . Smokeless tobacco: Never Used  Substance and Sexual Activity  . Alcohol use: Yes    Alcohol/week: 0.0 standard drinks    Comment: Occasional wine,none last 24hrs  . Drug use: No  . Sexual activity: Yes    Birth control/protection: Post-menopausal, Surgical  Lifestyle  . Physical activity:    Days per week: Not on file    Minutes per session: Not on file  . Stress: Not at all  Relationships  . Social connections:    Talks on phone: Patient refused    Gets together: Patient refused    Attends religious service: Patient refused    Active member of club or organization: Patient refused    Attends meetings of clubs or organizations: Patient refused    Relationship status: Patient refused  . Intimate partner violence:    Fear of current or ex partner: No    Emotionally abused: No    Physically abused: No    Forced sexual activity: No  Other Topics Concern  . Not on file  Social History Narrative   Regular Exercise -  Active but no regular exercise routine   Daily Caffeine Use:  NO          Review of Systems: See HPI, otherwise negative ROS  Physical Exam: BP (!) 158/65   Pulse 72   Temp (!) 96.8 F (36 C) (Tympanic)   Resp 18   Ht 5' (1.524 m)   Wt 63.5 kg   SpO2 98%   BMI 27.34 kg/m  General:   Alert,  pleasant and cooperative in NAD Head:  Normocephalic and  atraumatic. Neck:  Supple; no masses or thyromegaly. Lungs:  Clear throughout to auscultation, normal respiratory effort.    Heart:  +S1, +S2, Regular rate and rhythm, No edema. Abdomen:  Soft, nontender and nondistended. Normal bowel sounds, without guarding, and without rebound.   Neurologic:  Alert and  oriented x4;  grossly normal neurologically.  Impression/Plan: Robin Alexander is here for an endoscopy  to be performed for  evaluation of ulcer     Risks, benefits, limitations, and alternatives regarding endoscopy have been reviewed with the patient.  Questions have been answered.  All parties agreeable.   Robin Bellows, MD  04/26/2018, 10:39 AM

## 2018-04-26 NOTE — Anesthesia Preprocedure Evaluation (Addendum)
Anesthesia Evaluation  Patient identified by MRN, date of birth, ID band Patient awake    Reviewed: Allergy & Precautions, NPO status , Patient's Chart, lab work & pertinent test results  History of Anesthesia Complications (+) PROLONGED EMERGENCE and history of anesthetic complications  Airway Mallampati: III       Dental   Pulmonary neg sleep apnea, neg COPD, former smoker,           Cardiovascular hypertension, Pt. on medications (-) Past MI and (-) CHF (-) dysrhythmias + Valvular Problems/Murmurs (mild-moderate AS, mild-moderate MS) AS      Neuro/Psych neg Seizures    GI/Hepatic Neg liver ROS, GERD  Medicated and Controlled,  Endo/Other  neg diabetes  Renal/GU negative Renal ROS     Musculoskeletal   Abdominal   Peds  Hematology   Anesthesia Other Findings   Reproductive/Obstetrics                            Anesthesia Physical Anesthesia Plan  ASA: III  Anesthesia Plan: General   Post-op Pain Management:    Induction:   PONV Risk Score and Plan: 3 and Midazolam, TIVA and Propofol infusion  Airway Management Planned: Nasal Cannula  Additional Equipment:   Intra-op Plan:   Post-operative Plan:   Informed Consent: I have reviewed the patients History and Physical, chart, labs and discussed the procedure including the risks, benefits and alternatives for the proposed anesthesia with the patient or authorized representative who has indicated his/her understanding and acceptance.     Plan Discussed with:   Anesthesia Plan Comments:         Anesthesia Quick Evaluation

## 2018-04-26 NOTE — Anesthesia Post-op Follow-up Note (Signed)
Anesthesia QCDR form completed.        

## 2018-04-26 NOTE — Anesthesia Postprocedure Evaluation (Signed)
Anesthesia Post Note  Patient: Robin Alexander  Procedure(s) Performed: ESOPHAGOGASTRODUODENOSCOPY (EGD) WITH PROPOFOL (N/A )  Patient location during evaluation: Endoscopy Anesthesia Type: General Level of consciousness: awake and alert Pain management: pain level controlled Vital Signs Assessment: post-procedure vital signs reviewed and stable Respiratory status: spontaneous breathing and respiratory function stable Cardiovascular status: stable Anesthetic complications: no     Last Vitals:  Vitals:   04/26/18 1120 04/26/18 1127  BP: 130/85 (!) 141/53  Pulse:    Resp:    Temp:    SpO2:      Last Pain:  Vitals:   04/26/18 1134  TempSrc:   PainSc: 0-No pain                 Jenisis Harmsen K

## 2018-04-26 NOTE — Op Note (Signed)
[facility] Gastroenterology Patient Name: Robin Alexander Procedure Date: 04/26/2018 10:41 AM MRN: [id number] Account #: [id number] Date of Birth: November 19, 1932 Admit Type: Outpatient Age: 84 Room: [facility] ENDO ROOM 4 Gender: Female Note Status: Finalized Procedure:            Upper GI endoscopy Indications:          Follow-up of acute duodenal ulcer with hemorrhage Providers:            Jonathon Bellows MD, MD Referring MD:         Angela Adam. Caryl Bis (Referring MD) Medicines:            Monitored Anesthesia Care Complications:        No immediate complications. Procedure:            Pre-Anesthesia Assessment:                       - Prior to the procedure, a History and Physical was                        performed, and patient medications, allergies and                        sensitivities were reviewed. The patient's tolerance of                        previous anesthesia was reviewed.                       - The risks and benefits of the procedure and the                        sedation options and risks were discussed with the                        patient. All questions were answered and informed                        consent was obtained.                       - ASA Grade Assessment: III - A patient with severe                        systemic disease.                       After obtaining informed consent, the endoscope was                        passed under direct vision. Throughout the procedure,                        the patient's blood pressure, pulse, and oxygen                        saturations were monitored continuously. The Endoscope                        was introduced through the mouth, and advanced to the  third part of duodenum. The upper GI endoscopy was                        accomplished with ease. The patient tolerated the                        procedure well. Findings:      The esophagus was normal.      A 5 cm hiatal  hernia was present.      A large non-bleeding diverticulum was found in the second portion of the       duodenum.      The cardia and gastric fundus were normal on retroflexion. Impression:           - Normal esophagus.                       - 5 cm hiatal hernia.                       - Non-bleeding duodenal diverticulum.                       - No specimens collected. Recommendation:       - Discharge patient to home (with escort).                       - Resume previous diet.                       - Decrease omeprazole to 20 mg once daily Procedure Code(s):    --- Professional ---                       [phone], Esophagogastroduodenoscopy, flexible, transoral;                        diagnostic, including collection of specimen(s) by                        brushing or washing, when performed (separate procedure) Diagnosis Code(s):    --- Professional ---                       K44.9, Diaphragmatic hernia without obstruction or                        gangrene                       K26.0, Acute duodenal ulcer with hemorrhage                       K57.10, Diverticulosis of small intestine without                        perforation or abscess without bleeding CPT copyright 2017 American Medical Association. All rights reserved. The codes documented in this report are preliminary and upon coder review may  be revised to meet current compliance requirements. Jonathon Bellows, MD Jonathon Bellows MD, MD 04/26/2018 10:54:43 AM This report has been signed electronically. Number of Addenda: 0 Note Initiated On: 04/26/2018 10:41 AM      [facility]

## 2018-04-27 ENCOUNTER — Encounter: Payer: Self-pay | Admitting: Gastroenterology

## 2018-04-28 ENCOUNTER — Encounter: Payer: Self-pay | Admitting: Gastroenterology

## 2018-04-28 ENCOUNTER — Ambulatory Visit (INDEPENDENT_AMBULATORY_CARE_PROVIDER_SITE_OTHER): Payer: Medicare Other | Admitting: Gastroenterology

## 2018-04-28 VITALS — BP 137/71 | HR 75 | Ht 60.0 in | Wt 147.6 lb

## 2018-04-28 DIAGNOSIS — K269 Duodenal ulcer, unspecified as acute or chronic, without hemorrhage or perforation: Secondary | ICD-10-CM | POA: Diagnosis not present

## 2018-04-28 DIAGNOSIS — D509 Iron deficiency anemia, unspecified: Secondary | ICD-10-CM

## 2018-04-28 DIAGNOSIS — Z96611 Presence of right artificial shoulder joint: Secondary | ICD-10-CM | POA: Diagnosis not present

## 2018-04-28 DIAGNOSIS — M25611 Stiffness of right shoulder, not elsewhere classified: Secondary | ICD-10-CM | POA: Diagnosis not present

## 2018-04-28 MED ORDER — OMEPRAZOLE 20 MG PO CPDR: 20.0000 mg | DELAYED_RELEASE_CAPSULE | Freq: Every day | ORAL | 3 refills | Status: DC

## 2018-04-28 NOTE — Progress Notes (Signed)
Jonathon Bellows MD, MRCP(U.K) [address]  [place]  [place], [place] [address]  Main: [phone]  Fax: [phone]   Primary Care Physician: Leone Haven, MD  Primary Gastroenterologist:  Dr. Jonathon Bellows   No chief complaint on file.   HPI: Robin Alexander is a 84 y.o. female   Summary of history :  She is here today to see me for a hospital follow-up.  She was admitted on 03/02/2018 she was found to be anemic incidentally on routine lab work.  She denies any NSAID use, abdominal pain.  Hemoglobin on admission was 6.9 g a few months prior it was 9.1 g.  Vitamin B12, folate levels were normal.  Very low iron levels with a ferritin of 10. Performed an upper endoscopy and colonoscopy on 03/03/2018.  A large AVM in the cecum which I ablated with APC.  The internal hemorrhoids were noted.  The upper endoscopy demonstrated a large duodenal diverticulum multiple nonbleeding ulcers in the duodenum.  This showed erosive and reactive duodenitis.  Noted with erosive gastritis.     Interval history   10/21/2017 to 04/28/2018 6 weeks later on 04/26/2018.  Upper endoscopy showed the duodenum had healed completely.  5 cm hiatal hernia noted.  03/16/2018: H. pylori stool antigen negative.  03/30/2018 2019 hemoglobin 10.6 g.  Not on any NSAID's, doing well , black stool as she is on iron pills.     Current Outpatient Medications  Medication Sig Dispense Refill  . Acetaminophen (TYLENOL ARTHRITIS PAIN PO) Take by mouth 2 (two) times daily as needed.    Marland Kitchen amLODipine-benazepril (LOTREL) 5-20 MG capsule TAKE 1 CAPSULE BY MOUTH  DAILY 90 capsule 3  . cholecalciferol (VITAMIN D) 1000 units tablet Take 1,000 Units by mouth daily.    Marland Kitchen docusate sodium (COLACE) 100 MG capsule Take 100 mg by mouth daily.    . ferrous sulfate 325 (65 FE) MG EC tablet Take 1 tablet (325 mg total) by mouth 2 (two) times daily with a meal. 60 tablet 0  . Multiple Vitamin (MULTIVITAMIN WITH MINERALS) TABS tablet Take 1  tablet by mouth daily. CENTRUM SILVER    . Omega-3 Fatty Acids (FISH OIL) 1000 MG CAPS Take 1,000-2,000 mg by mouth daily. TAKE 2 CAPSULES IN THE MORNING & 1 CAPSULE AT NIGHT    . omeprazole (PRILOSEC) 40 MG capsule Take 1 capsule (40 mg total) by mouth 2 (two) times daily. 60 capsule 1  . simvastatin (ZOCOR) 20 MG tablet TAKE 1 TABLET BY MOUTH  EVERY MORNING (Patient taking differently: TAKE 1 TABLET BY MOUTH  EVERY EVENING) 90 tablet 3   Current Facility-Administered Medications  Medication Dose Route Frequency Provider Last Rate Last Dose  . albuterol (PROVENTIL) (2.5 MG/3ML) 0.083% nebulizer solution 2.5 mg  2.5 mg Nebulization Once Burnard Hawthorne, FNP        Allergies as of 04/28/2018 - Review Complete 04/26/2018  Allergen Reaction Noted  . Adhesive [tape]  11/17/2017  . Codeine Itching 12/26/2011    ROS:  General: Negative for anorexia, weight loss, fever, chills, fatigue, weakness. ENT: Negative for hoarseness, difficulty swallowing , nasal congestion. CV: Negative for chest pain, angina, palpitations, dyspnea on exertion, peripheral edema.  Respiratory: Negative for dyspnea at rest, dyspnea on exertion, cough, sputum, wheezing.  GI: See history of present illness. GU:  Negative for dysuria, hematuria, urinary incontinence, urinary frequency, nocturnal urination.  Endo: Negative for unusual weight change.    Physical Examination:   BP 137/71  Pulse 75   Ht 5' (1.524 m)   Wt 147 lb 9.6 oz (67 kg)   BMI 28.83 kg/m   General: Well-nourished, well-developed in no acute distress.  Eyes: No icterus. Conjunctivae pink. Mouth: Oropharyngeal mucosa moist and pink , no lesions erythema or exudate. Lungs: Clear to auscultation bilaterally. Non-labored. Heart: Regular rate and rhythm, systolic murmur in aortic area, no  rubs or gallops.  Abdomen: Bowel sounds are normal, nontender, nondistended, no hepatosplenomegaly or masses, no abdominal bruits or hernia , no rebound or  guarding.   Extremities: No lower extremity edema. No clubbing or deformities. Neuro: Alert and oriented x 3.  Grossly intact. Skin: Warm and dry, no jaundice.   Psych: Alert and cooperative, normal mood and affect.   Imaging Studies: No results found.  Assessment and Plan:   Robin Alexander is a 84 y.o. y/o female here to follow-up for anemia when she was seen at the hospital on 03/02/2018.  He had a iron deficiency anemia.  EGD plus colonoscopy in July 2019 1  cecal AVM that was treated a nd multiple bleeding nonbleeding ulcers in the duodenum.  Repeat EGD 6 weeks later showed that the duodenum completely healed.  CBC is slowly rising.she has a history of known aortic stenosis.   Plan  1.  Repeat hemoglobin in 1 to 2 months.   2. No NSAIDs. 3.  Continue on 20 mg once a day long term due to large hiatal hernia  4.  If there is a further drop in hemoglobin then at that point we would consider stool study of the small bowel. 5. Can resume Asprin if needed low dose while on PPI at the same time.    Dr Jonathon Bellows  MD,MRCP [facility]) Follow up in 4 months

## 2018-04-30 ENCOUNTER — Other Ambulatory Visit (INDEPENDENT_AMBULATORY_CARE_PROVIDER_SITE_OTHER): Payer: Medicare Other

## 2018-04-30 DIAGNOSIS — D649 Anemia, unspecified: Secondary | ICD-10-CM | POA: Diagnosis not present

## 2018-04-30 LAB — CBC
HEMATOCRIT: 33.5 % — AB (ref 36.0–46.0)
Hemoglobin: 11.1 g/dL — ABNORMAL LOW (ref 12.0–15.0)
MCHC: 33.2 g/dL (ref 30.0–36.0)
MCV: 88.7 fl (ref 78.0–100.0)
Platelets: 190 10*3/uL (ref 150.0–400.0)
RBC: 3.77 Mil/uL — AB (ref 3.87–5.11)
RDW: 23.6 % — ABNORMAL HIGH (ref 11.5–15.5)
WBC: 6.5 10*3/uL (ref 4.0–10.5)

## 2018-05-01 ENCOUNTER — Other Ambulatory Visit: Payer: Self-pay | Admitting: Family Medicine

## 2018-05-01 DIAGNOSIS — D500 Iron deficiency anemia secondary to blood loss (chronic): Secondary | ICD-10-CM

## 2018-05-04 ENCOUNTER — Other Ambulatory Visit: Payer: Self-pay | Admitting: Gastroenterology

## 2018-05-05 DIAGNOSIS — Z96611 Presence of right artificial shoulder joint: Secondary | ICD-10-CM | POA: Diagnosis not present

## 2018-05-05 DIAGNOSIS — M25611 Stiffness of right shoulder, not elsewhere classified: Secondary | ICD-10-CM | POA: Diagnosis not present

## 2018-05-08 ENCOUNTER — Other Ambulatory Visit: Payer: Self-pay | Admitting: Family Medicine

## 2018-05-12 DIAGNOSIS — M25611 Stiffness of right shoulder, not elsewhere classified: Secondary | ICD-10-CM | POA: Diagnosis not present

## 2018-05-12 DIAGNOSIS — Z96611 Presence of right artificial shoulder joint: Secondary | ICD-10-CM | POA: Diagnosis not present

## 2018-05-19 DIAGNOSIS — Z96611 Presence of right artificial shoulder joint: Secondary | ICD-10-CM | POA: Diagnosis not present

## 2018-05-20 DIAGNOSIS — M25611 Stiffness of right shoulder, not elsewhere classified: Secondary | ICD-10-CM | POA: Diagnosis not present

## 2018-05-20 DIAGNOSIS — Z96611 Presence of right artificial shoulder joint: Secondary | ICD-10-CM | POA: Diagnosis not present

## 2018-06-08 ENCOUNTER — Other Ambulatory Visit: Payer: Self-pay | Admitting: Family Medicine

## 2018-06-08 NOTE — Telephone Encounter (Signed)
Last OV 04/14/2018   Last refilled 05/10/2018 disp 60 with no refills   Sent to PCP for approval

## 2018-06-18 DIAGNOSIS — L821 Other seborrheic keratosis: Secondary | ICD-10-CM | POA: Diagnosis not present

## 2018-06-21 ENCOUNTER — Other Ambulatory Visit: Payer: Self-pay | Admitting: Family Medicine

## 2018-06-30 DIAGNOSIS — R809 Proteinuria, unspecified: Secondary | ICD-10-CM | POA: Diagnosis not present

## 2018-06-30 DIAGNOSIS — N183 Chronic kidney disease, stage 3 (moderate): Secondary | ICD-10-CM | POA: Diagnosis not present

## 2018-06-30 DIAGNOSIS — I129 Hypertensive chronic kidney disease with stage 1 through stage 4 chronic kidney disease, or unspecified chronic kidney disease: Secondary | ICD-10-CM | POA: Diagnosis not present

## 2018-07-05 ENCOUNTER — Other Ambulatory Visit (INDEPENDENT_AMBULATORY_CARE_PROVIDER_SITE_OTHER): Payer: Medicare Other

## 2018-07-05 DIAGNOSIS — D500 Iron deficiency anemia secondary to blood loss (chronic): Secondary | ICD-10-CM

## 2018-07-05 DIAGNOSIS — Z23 Encounter for immunization: Secondary | ICD-10-CM | POA: Diagnosis not present

## 2018-07-05 LAB — CBC
HCT: 35.6 % — ABNORMAL LOW (ref 36.0–46.0)
Hemoglobin: 12 g/dL (ref 12.0–15.0)
MCHC: 33.7 g/dL (ref 30.0–36.0)
MCV: 95.5 fl (ref 78.0–100.0)
Platelets: 197 10*3/uL (ref 150.0–400.0)
RBC: 3.73 Mil/uL — ABNORMAL LOW (ref 3.87–5.11)
RDW: 13.5 % (ref 11.5–15.5)
WBC: 7.3 10*3/uL (ref 4.0–10.5)

## 2018-07-23 ENCOUNTER — Ambulatory Visit (INDEPENDENT_AMBULATORY_CARE_PROVIDER_SITE_OTHER): Payer: Medicare Other

## 2018-07-23 ENCOUNTER — Other Ambulatory Visit: Payer: Self-pay

## 2018-07-23 ENCOUNTER — Telehealth: Payer: Self-pay

## 2018-07-23 VITALS — BP 122/70 | HR 69 | Temp 97.8°F | Resp 15 | Ht <= 58 in | Wt 148.4 lb

## 2018-07-23 DIAGNOSIS — Z0000 Encounter for general adult medical examination without abnormal findings: Secondary | ICD-10-CM | POA: Diagnosis not present

## 2018-07-23 DIAGNOSIS — D500 Iron deficiency anemia secondary to blood loss (chronic): Secondary | ICD-10-CM

## 2018-07-23 MED ORDER — FERROUS SULFATE 325 (65 FE) MG PO TBEC: 325.0000 mg | DELAYED_RELEASE_TABLET | Freq: Two times a day (BID) | ORAL | 1 refills | Status: DC

## 2018-07-23 NOTE — Telephone Encounter (Signed)
She should continue the iron for now and we can recheck her levels in 2 months. Orders placed.

## 2018-07-23 NOTE — Progress Notes (Signed)
Subjective:   Robin Alexander is a 84 y.o. female who presents for Medicare Annual (Subsequent) preventive examination.  Review of Systems:  No ROS.  Medicare Wellness Visit. Additional risk factors are reflected in the social history. Cardiac Risk Factors include: advanced age (>50men, >64 women);hypertension     Objective:     Vitals: BP 122/70 (BP Location: Left Arm, Patient Position: Sitting, Cuff Size: Normal)   Pulse 69   Temp 97.8 F (36.6 C) (Oral)   Resp 15   Ht 4\' 10"  (1.473 m)   Wt 148 lb 6.4 oz (67.3 kg)   SpO2 96%   BMI 31.02 kg/m   Body mass index is 31.02 kg/m.  Advanced Directives 07/23/2018 04/26/2018 03/03/2018 03/02/2018 11/23/2017 11/20/2017 11/20/2017  Does Patient Have a Medical Advance Directive? Yes Yes Yes Yes Yes Yes Yes  Type of Paramedic of [place];Living will [place];Living will Living will Living will [place];Living will [place];Living will Living will;Healthcare Power of Attorney  Does patient want to make changes to medical advance directive? No - Patient declined - - No - Patient declined No - Patient declined No - Patient declined No - Patient declined  Copy of [place] in Chart? Yes - validated most recent copy scanned in chart (See row information) Yes Yes - No - copy requested Yes Yes    Tobacco Social History   Tobacco Use  Smoking Status Former Smoker  . Packs/day: 0.75  . Years: 20.00  . Pack years: 15.00  . Types: Cigarettes  . Last attempt to quit: 09/02/1983  . Years since quitting: 34.9  Smokeless Tobacco Never Used     Counseling given: Not Answered   Clinical Intake:  Pre-visit preparation completed: Yes        Nutritional Status: BMI 25 -29 Overweight Diabetes: No  How often do you need to have someone help you when you read instructions, pamphlets, or other written materials from your doctor or pharmacy?: 2 -  Rarely  Interpreter Needed?: No     Past Medical History:  Diagnosis Date  . Abnormal CT scan, chest 03/20/2014  . Anemia   . Arthritis    shoulders  . Chicken pox   . Complication of anesthesia 2016   slow to wake up after anesthesia  . Dyspnea    uses inhaler when she gets bronchitis  . GERD (gastroesophageal reflux disease)   . Heart murmur   . High cholesterol   . HTN (hypertension)   . Multiple duodenal ulcers   . Pneumonia    spring 2015   Past Surgical History:  Procedure Laterality Date  . ABDOMINAL HYSTERECTOMY     in 50's  . ANTERIOR LAT LUMBAR FUSION Right 02/28/2015   Procedure: ANTERIOR LATERAL LUMBAR FUSION 1 LEVEL;  Surgeon: Phylliss Bob, MD;  Location: [place];  Service: Orthopedics;  Laterality: Right;  Right sided lumbar 3-4 lateral interbody fusion  . APPENDECTOMY  1950  . BACK SURGERY     rod in back  . BICEPT TENODESIS Right 11/20/2017   Procedure: BICEPS TENODESIS;  Surgeon: Leim Fabry, MD;  Location: [facility];  Service: Orthopedics;  Laterality: Right;  . CATARACT EXTRACTION W/ INTRAOCULAR LENS  IMPLANT, BILATERAL Bilateral 2011  . CESAREAN SECTION     2  . COLONOSCOPY WITH PROPOFOL N/A 03/03/2018   Procedure: COLONOSCOPY WITH PROPOFOL;  Surgeon: Jonathon Bellows, MD;  Location: [facility] ENDOSCOPY;  Service: Gastroenterology;  Laterality: N/A;  . ESOPHAGOGASTRODUODENOSCOPY (EGD) WITH PROPOFOL N/A 03/03/2018   Procedure: ESOPHAGOGASTRODUODENOSCOPY (EGD) WITH PROPOFOL;  Surgeon: Jonathon Bellows, MD;  Location: [facility] ENDOSCOPY;  Service: Gastroenterology;  Laterality: N/A;  . ESOPHAGOGASTRODUODENOSCOPY (EGD) WITH PROPOFOL N/A 04/26/2018   Procedure: ESOPHAGOGASTRODUODENOSCOPY (EGD) WITH PROPOFOL;  Surgeon: Jonathon Bellows, MD;  Location: [facility] ENDOSCOPY;  Service: Gastroenterology;  Laterality: N/A;  . JOINT REPLACEMENT    . REVERSE SHOULDER ARTHROPLASTY Right 11/20/2017   Procedure: REVERSE SHOULDER ARTHROPLASTY;  Surgeon: Leim Fabry, MD;  Location: [facility];  Service:  Orthopedics;  Laterality: Right;  . ROTATOR CUFF REPAIR Right    right shoulder   10 YRS  . TOTAL HIP ARTHROPLASTY Bilateral 2005, 2006   Bilateral, [place]   Family History  Problem Relation Age of Onset  . Arthritis Mother   . Heart disease Mother   . Arthritis Father   . Stroke Brother   . Heart disease Brother   . Cancer Neg Hx    Social History   Socioeconomic History  . Marital status: Married    Spouse name: Not on file  . Number of children: 2  . Years of education: Not on file  . Highest education level: Not on file  Occupational History  . Occupation: Retired - Product/process development scientist: RETIRED  Social Needs  . Financial resource strain: Not hard at all  . Food insecurity:    Worry: Never true    Inability: Never true  . Transportation needs:    Medical: No    Non-medical: No  Tobacco Use  . Smoking status: Former Smoker    Packs/day: 0.75    Years: 20.00    Pack years: 15.00    Types: Cigarettes    Last attempt to quit: 09/02/1983    Years since quitting: 34.9  . Smokeless tobacco: Never Used  Substance and Sexual Activity  . Alcohol use: Yes    Alcohol/week: 0.0 standard drinks    Comment: Occasional wine,none last 24hrs  . Drug use: No  . Sexual activity: Yes    Birth control/protection: Post-menopausal, Surgical  Lifestyle  . Physical activity:    Days per week: Not on file    Minutes per session: Not on file  . Stress: Not at all  Relationships  . Social connections:    Talks on phone: Patient refused    Gets together: Patient refused    Attends religious service: Patient refused    Active member of club or organization: Patient refused    Attends meetings of clubs or organizations: Patient refused    Relationship status: Patient refused  Other Topics Concern  . Not on file  Social History Narrative   Regular Exercise -  Active but no regular exercise routine   Daily Caffeine Use:  NO          Outpatient Encounter Medications as of  07/23/2018  Medication Sig  . Acetaminophen (TYLENOL ARTHRITIS PAIN PO) Take by mouth 2 (two) times daily as needed.  Marland Kitchen amLODipine-benazepril (LOTREL) 5-20 MG capsule TAKE 1 CAPSULE BY MOUTH  DAILY  . cholecalciferol (VITAMIN D) 1000 units tablet Take 1,000 Units by mouth daily.  Marland Kitchen docusate sodium (COLACE) 100 MG capsule Take 100 mg by mouth daily.  . ferrous sulfate 325 (65 FE) MG EC tablet TAKE 1 TABLET (325 MG TOTAL) BY MOUTH 2 (TWO) TIMES DAILY WITH A MEAL.  . Multiple Vitamin (MULTIVITAMIN WITH MINERALS) TABS tablet Take 1 tablet by mouth daily. CENTRUM SILVER  .  Omega-3 Fatty Acids (FISH OIL) 1000 MG CAPS Take 1,000-2,000 mg by mouth daily. TAKE 2 CAPSULES IN THE MORNING & 1 CAPSULE AT NIGHT  . omeprazole (PRILOSEC) 20 MG capsule Take 1 capsule (20 mg total) by mouth daily.  Marland Kitchen omeprazole (PRILOSEC) 40 MG capsule Take 1 capsule (40 mg total) by mouth 2 (two) times daily.  . simvastatin (ZOCOR) 20 MG tablet TAKE 1 TABLET BY MOUTH  EVERY MORNING (Patient taking differently: TAKE 1 TABLET BY MOUTH  EVERY EVENING)   Facility-Administered Encounter Medications as of 07/23/2018  Medication  . albuterol (PROVENTIL) (2.5 MG/3ML) 0.083% nebulizer solution 2.5 mg    Activities of Daily Living In your present state of health, do you have any difficulty performing the following activities: 07/23/2018 03/03/2018  Hearing? N -  Vision? N -  Difficulty concentrating or making decisions? N -  Walking or climbing stairs? N -  Dressing or bathing? N -  Comment - -  Doing errands, shopping? N N  Preparing Food and eating ? N -  Using the Toilet? N -  In the past six months, have you accidently leaked urine? N -  Do you have problems with loss of bowel control? N -  Managing your Medications? N -  Managing your Finances? N -  Housekeeping or managing your Housekeeping? Y -  Comment Housekeeping assists -  Some recent data might be hidden    Patient Care Team: Leone Haven, MD as PCP -  General (Family Medicine)    Assessment:   This is a routine wellness examination for [place].  The goal of the wellness visit is to assist the patient how to close the gaps in care and create a preventative care plan for the patient.   The roster of all physicians providing medical care to patient is listed in the Snapshot section of the chart.  States she feels overall good.  Questions if she can stop taking iron.  Last hemoglobin result 12.0. Deferred to pcp for follow up.   Taking calcium VIT D as appropriate/Osteoporosis risk reviewed.    Safety issues reviewed; Smoke and carbon monoxide detectors in the home. No firearms in the home. Wears seatbelts when driving or riding with others. No violence in the home.  They do not have excessive sun exposure.  Discussed the need for sun protection: hats, long sleeves and the use of sunscreen if there is significant sun exposure.  Patient is alert, normal appearance, oriented to person/place/and time.  Correctly identified the president of the [place] and recalls of 2/3 words. Performs simple calculations and can read correct time from watch face.  Displays appropriate judgement.  No new identified risk were noted.  No failures at ADL's or IADL's.  Ambulates with cane.   BMI- discussed the importance of a healthy diet, water intake and the benefits of aerobic exercise. Educational material provided.   24 hour diet recall: Regular diet  Dental- every 6 months.  Sleep patterns- Sleeps without issues.   Health maintenance gaps- closed.  Exercise Activities and Dietary recommendations Current Exercise Habits: Structured exercise class, Type of exercise: calisthenics, Time (Minutes): 30, Frequency (Times/Week): 3, Weekly Exercise (Minutes/Week): 90, Intensity: Mild  Goals    . Maintain healthy Lifestyle     Brain exercise activity; speak a foreign language with grandchildren Stay hydrated Stay active Healthy diet       Fall  Risk Fall Risk  07/23/2018 07/22/2017 01/05/2017 05/23/2016 03/11/2016  Falls in the past year? 0 No  No Yes No  Number falls in past yr: - - - 1 -  Injury with Fall? - - - Yes -  Follow up - - - Education provided;Falls prevention discussed -   Depression Screen PHQ 2/9 Scores 07/23/2018 07/22/2017 01/05/2017 05/23/2016  PHQ - 2 Score 0 0 0 0  PHQ- 9 Score - 0 - -     Cognitive Function MMSE - Mini Mental State Exam 07/22/2017 05/23/2016 05/23/2015  Orientation to time 5 5 5   Orientation to Place 5 5 5   Registration 3 3 3   Attention/ Calculation 5 5 5   Recall 3 3 3   Language- name 2 objects 2 2 2   Language- repeat 1 1 1   Language- follow 3 step command 3 3 3   Language- read & follow direction 1 1 1   Write a sentence 1 1 1   Copy design 1 1 1   Total score 30 30 30      6CIT Screen 07/23/2018  What Year? 0 points  What month? 0 points  What time? 0 points  Count back from 20 0 points  Months in reverse 0 points  Repeat phrase 0 points  Total Score 0    Immunization History  Administered Date(s) Administered  . Influenza Split 07/31/2011, 06/29/2012  . Influenza, High Dose Seasonal PF 05/23/2015, 05/02/2016, 06/17/2017, 07/05/2018  . Influenza,inj,Quad PF,6+ Mos 05/04/2013, 05/15/2014  . Pneumococcal Conjugate-13 11/03/2013  . Pneumococcal Polysaccharide-23 09/01/2009  . Tdap 05/23/2015  . Zoster 07/30/2010   Screening Tests Health Maintenance  Topic Date Due  . TETANUS/TDAP  05/22/2025  . INFLUENZA VACCINE  Completed  . DEXA SCAN  Completed  . PNA vac Low Risk Adult  Completed      Plan:    End of life planning; Advance aging; Advanced directives discussed. Copy of current HCPOA/Living Will on file.    I have personally reviewed and noted the following in the patient's chart:   . Medical and social history . Use of alcohol, tobacco or illicit drugs  . Current medications and supplements . Functional ability and status . Nutritional status . Physical  activity . Advanced directives . List of other physicians . Hospitalizations, surgeries, and ER visits in previous 12 months . Vitals . Screenings to include cognitive, depression, and falls . Referrals and appointments  In addition, I have reviewed and discussed with patient certain preventive protocols, quality metrics, and best practice recommendations. A written personalized care plan for preventive services as well as general preventive health recommendations were provided to patient.     Varney Biles, LPN  [id number]/[id number]

## 2018-07-23 NOTE — Patient Instructions (Addendum)
  Robin Alexander , Thank you for taking time to come for your Medicare Wellness Visit. I appreciate your ongoing commitment to your health goals. Please review the following plan we discussed and let me know if I can assist you in the future.   These are the goals we discussed: Goals    . Maintain healthy Lifestyle     Brain exercise activity; speak a foreign language with grandchildren Stay hydrated Stay active Healthy diet       This is a list of the screening recommended for you and due dates:  Health Maintenance  Topic Date Due  . Tetanus Vaccine  05/22/2025  . Flu Shot  Completed  . DEXA scan (bone density measurement)  Completed  . Pneumonia vaccines  Completed

## 2018-07-23 NOTE — Telephone Encounter (Signed)
I have called patient & made lab appointment for 2 months out. I have also refilled script & sent to [facility] in [place] as requested.

## 2018-07-23 NOTE — Telephone Encounter (Signed)
Should patient stop taking the iron.  Her last hgb was 12.0

## 2018-07-23 NOTE — Progress Notes (Signed)
I have reviewed the above note and agree. Please see phone note regarding iron supplementation.   Tommi Rumps, M.D.

## 2018-08-05 ENCOUNTER — Other Ambulatory Visit: Payer: Self-pay

## 2018-08-05 NOTE — Patient Outreach (Signed)
[facility]) Care Management  08/05/2018  JENI DULING February 22, 1933 [id number]   Medication Adherence call to Mrs. Yasamin Karel spoke with patient she is due on Simvastatin 20 mg she recently received a prescription from mail order [facility]. Mrs. Vizcarrondo is showing past due under [place].   [place] Management Direct Dial [phone]  Fax [phone] [email or web address]

## 2018-08-10 ENCOUNTER — Ambulatory Visit (INDEPENDENT_AMBULATORY_CARE_PROVIDER_SITE_OTHER): Payer: Medicare Other | Admitting: Family Medicine

## 2018-08-10 ENCOUNTER — Ambulatory Visit (INDEPENDENT_AMBULATORY_CARE_PROVIDER_SITE_OTHER): Payer: Medicare Other

## 2018-08-10 ENCOUNTER — Encounter: Payer: Self-pay | Admitting: Family Medicine

## 2018-08-10 VITALS — BP 140/70 | HR 82 | Temp 97.9°F | Ht <= 58 in | Wt 148.6 lb

## 2018-08-10 DIAGNOSIS — S59901A Unspecified injury of right elbow, initial encounter: Secondary | ICD-10-CM | POA: Diagnosis not present

## 2018-08-10 DIAGNOSIS — M25511 Pain in right shoulder: Secondary | ICD-10-CM | POA: Diagnosis not present

## 2018-08-10 DIAGNOSIS — M25521 Pain in right elbow: Secondary | ICD-10-CM | POA: Diagnosis not present

## 2018-08-10 DIAGNOSIS — K922 Gastrointestinal hemorrhage, unspecified: Secondary | ICD-10-CM

## 2018-08-10 DIAGNOSIS — M79601 Pain in right arm: Secondary | ICD-10-CM | POA: Diagnosis not present

## 2018-08-10 DIAGNOSIS — S4991XA Unspecified injury of right shoulder and upper arm, initial encounter: Secondary | ICD-10-CM | POA: Diagnosis not present

## 2018-08-10 DIAGNOSIS — M79621 Pain in right upper arm: Secondary | ICD-10-CM

## 2018-08-10 DIAGNOSIS — I10 Essential (primary) hypertension: Secondary | ICD-10-CM

## 2018-08-10 DIAGNOSIS — M898X2 Other specified disorders of bone, upper arm: Secondary | ICD-10-CM

## 2018-08-10 LAB — BASIC METABOLIC PANEL
BUN: 35 mg/dL — ABNORMAL HIGH (ref 6–23)
CHLORIDE: 103 meq/L (ref 96–112)
CO2: 27 meq/L (ref 19–32)
Calcium: 10.4 mg/dL (ref 8.4–10.5)
Creatinine, Ser: 1.14 mg/dL (ref 0.40–1.20)
GFR: 48.3 mL/min — ABNORMAL LOW (ref >60.00)
GLUCOSE: 121 mg/dL — AB (ref 70–99)
POTASSIUM: 4.5 meq/L (ref 3.5–5.1)
SODIUM: 139 meq/L (ref 135–145)

## 2018-08-10 LAB — CBC
HCT: 35.4 % — ABNORMAL LOW (ref 36.0–46.0)
Hemoglobin: 11.9 g/dL — ABNORMAL LOW (ref 12.0–15.0)
MCHC: 33.6 g/dL (ref 30.0–36.0)
MCV: 96.7 fl (ref 78.0–100.0)
PLATELETS: 205 10*3/uL (ref 150.0–400.0)
RBC: 3.66 Mil/uL — AB (ref 3.87–5.11)
RDW: 13.3 % (ref 11.5–15.5)
WBC: 9.2 10*3/uL (ref 4.0–10.5)

## 2018-08-10 NOTE — Assessment & Plan Note (Signed)
Patient has followed up with GI.  She continues on Prilosec.  She continues on iron.  We will recheck her iron studies today.  If they are in the adequate range and her anemia is resolved we could consider discontinuing iron supplementation.

## 2018-08-10 NOTE — Assessment & Plan Note (Signed)
Adequately controlled.  Continue current regimen. 

## 2018-08-10 NOTE — Assessment & Plan Note (Signed)
Patient is status post mechanical fall.  Still has discomfort in her shoulder and upper arm.  We will obtain x-rays to rule out fracture.  More likely this is related to soft tissue injury.  We will determine the neck step in management based off of x-ray results.

## 2018-08-10 NOTE — Patient Instructions (Signed)
Nice to see you. We will get x-rays today and lab work and contact you with the results.

## 2018-08-10 NOTE — Progress Notes (Signed)
Tommi Rumps, MD Phone: [phone]  Robin Alexander is a 84 y.o. female who presents today for follow-up.  CC: Fall, right arm pain, hypertension, anemia  Fall: Patient reports a fall 5 days ago where her feet got tangled in a shopping cart.  She fell onto her right upper arm, elbow, and shoulder.  She notes no head injury or loss of consciousness.  She has been taking Tylenol arthritis.  She does note her lateral aspect right shoulder and upper and midportion of her right humerus still hurt particularly with abduction.  She does have good range of motion.  Hypertension: She is not checking at home.  She is taking amlodipine and benazepril.  No chest pain, shortness breath, or edema.  GI bleed: She notes no bright red blood per rectum.  No melena.  She does note her stools are black related to iron though they are not tarry.  She continues on iron supplementation.  She recently underwent an EGD which revealed no evidence of bleeding.  She continues on Prilosec and iron supplementation.  No abdominal pain.  Social History   Tobacco Use  Smoking Status Former Smoker  . Packs/day: 0.75  . Years: 20.00  . Pack years: 15.00  . Types: Cigarettes  . Last attempt to quit: 09/02/1983  . Years since quitting: 34.9  Smokeless Tobacco Never Used     ROS see history of present illness  Objective  Physical Exam Vitals:   08/10/18 1033  BP: 140/70  Pulse: 82  Temp: 97.9 F (36.6 C)  SpO2: 95%    BP Readings from Last 3 Encounters:  08/10/18 140/70  07/23/18 122/70  04/28/18 137/71   Wt Readings from Last 3 Encounters:  08/10/18 148 lb 9.6 oz (67.4 kg)  07/23/18 148 lb 6.4 oz (67.3 kg)  04/28/18 147 lb 9.6 oz (67 kg)    Physical Exam  Constitutional: No distress.  Cardiovascular: Normal rate and regular rhythm.  Murmur (2/6 systolic ejection murmur) heard. Pulmonary/Chest: Effort normal and breath sounds normal.  Musculoskeletal: She exhibits no edema.  Shoulders are  symmetric, slight tenderness of her right deltoid muscle and proximal and midportion humerus, no apparent skin changes or bruising in this area, no other shoulder tenderness, no elbow tenderness, no lower arm or hand tenderness, no palpable bony defects, slight decreased range of motion on external rotation, discomfort on abduction passively and actively, no tenderness of the left shoulder, full range of motion left shoulder actively and passively without discomfort, negative empty can, discomfort on speeds testing on the right  Neurological: She is alert.  Skin: Skin is warm and dry. She is not diaphoretic.     Assessment/Plan: Please see individual problem list.  Hypertension Adequately controlled.  Continue current regimen.  Acute pain of right shoulder Patient is status post mechanical fall.  Still has discomfort in her shoulder and upper arm.  We will obtain x-rays to rule out fracture.  More likely this is related to soft tissue injury.  We will determine the neck step in management based off of x-ray results.  GI bleed Patient has followed up with GI.  She continues on Prilosec.  She continues on iron.  We will recheck her iron studies today.  If they are in the adequate range and her anemia is resolved we could consider discontinuing iron supplementation.   Orders Placed This Encounter  Procedures  . DG Shoulder Right    Standing Status:   Future    Number of Occurrences:  1    Standing Expiration Date:   10/12/2019    Order Specific Question:   Reason for Exam (SYMPTOM  OR DIAGNOSIS REQUIRED)    Answer:   fall on to right elbow, humerus, and shoulder with persistent pain at shoulder and humerus upper and mid portion    Order Specific Question:   Preferred imaging location?    Answer:   [facility] Specific Question:   Radiology Contrast Protocol - do NOT remove file path    Answer:   \\[email or web address]s.pdf  . DG  Humerus Right    Standing Status:   Future    Number of Occurrences:   1    Standing Expiration Date:   10/12/2019    Order Specific Question:   Reason for Exam (SYMPTOM  OR DIAGNOSIS REQUIRED)    Answer:   fall on to right elbow, humerus, and shoulder with persistent pain at shoulder and humerus upper and mid portion    Order Specific Question:   Preferred imaging location?    Answer:   [facility] Specific Question:   Radiology Contrast Protocol - do NOT remove file path    Answer:   \\[email or web address]s.pdf  . DG Elbow Complete Right    Standing Status:   Future    Number of Occurrences:   1    Standing Expiration Date:   10/12/2019    Order Specific Question:   Reason for Exam (SYMPTOM  OR DIAGNOSIS REQUIRED)    Answer:   fall on to right elbow, humerus, and shoulder with persistent pain at shoulder and humerus upper and mid portion    Order Specific Question:   Preferred imaging location?    Answer:   [facility] Specific Question:   Radiology Contrast Protocol - do NOT remove file path    Answer:   \\[email or web address]\[email or web address]s.pdf  . CBC  . Iron, TIBC and Ferritin Panel  . Basic Metabolic Panel (BMET)    No orders of the defined types were placed in this encounter.    Tommi Rumps, MD [place]

## 2018-08-11 ENCOUNTER — Telehealth: Payer: Self-pay | Admitting: Family Medicine

## 2018-08-11 LAB — IRON,TIBC AND FERRITIN PANEL
%SAT: 36 % (ref 16–45)
Ferritin: 122 ng/mL (ref 16–288)
Iron: 103 ug/dL (ref 45–160)
TIBC: 289 mcg/dL (calc) (ref 250–450)

## 2018-08-11 NOTE — Telephone Encounter (Signed)
Called and spoke with patient to go over lab results. Pt advised and voiced understanding.

## 2018-08-11 NOTE — Telephone Encounter (Signed)
Copied from [place] [id number]. Topic: General - Other >> Aug 11, 2018 11:03 AM Yvette Rack wrote: Reason for CRM: pt calling about xray results  Call pt today on home number if not today call cell number tomorrow

## 2018-08-13 ENCOUNTER — Telehealth: Payer: Self-pay | Admitting: Family Medicine

## 2018-08-13 NOTE — Telephone Encounter (Signed)
Sent to [place]

## 2018-08-13 NOTE — Telephone Encounter (Signed)
The report of the actual films, If X-rays done in office let [place] know you need a Disc burned. Patient will have to sign a release.

## 2018-08-13 NOTE — Telephone Encounter (Signed)
Please advise 

## 2018-08-13 NOTE — Telephone Encounter (Signed)
Are we able to print off x-ray results?  Sent to [facility]

## 2018-08-13 NOTE — Telephone Encounter (Signed)
Copied from [place] [id number]. Topic: General - Other >> Aug 13, 2018  3:18 PM Keene Breath wrote: Reason for CRM: Patient called to request a copy of her recent xrays so she can bring them to her ortho appointment next week.  Please advise and call back [phone]

## 2018-08-16 ENCOUNTER — Encounter: Payer: Self-pay | Admitting: Gastroenterology

## 2018-08-16 ENCOUNTER — Other Ambulatory Visit: Payer: Self-pay

## 2018-08-16 ENCOUNTER — Ambulatory Visit: Payer: Medicare Other | Admitting: Gastroenterology

## 2018-08-16 VITALS — BP 132/66 | HR 78 | Ht <= 58 in | Wt 145.8 lb

## 2018-08-16 DIAGNOSIS — D509 Iron deficiency anemia, unspecified: Secondary | ICD-10-CM

## 2018-08-16 NOTE — Telephone Encounter (Signed)
Called and spoke with patient. Pt advised and voiced understanding.  

## 2018-08-16 NOTE — Telephone Encounter (Signed)
Pt copy of CD placed up front in folder for pt to pick up.

## 2018-08-16 NOTE — Progress Notes (Signed)
Robin Bellows MD, MRCP(U.K) [address]  [place], [place] [address]  Main: [phone]  Fax: [phone]   Primary Care Physician: Leone Haven, MD  Primary Gastroenterologist:  Dr. Jonathon Alexander   Chief Complaint  Patient presents with  . Follow-up    Duodenal Ulcer, Iron Deficiency Anemia    HPI: Robin Alexander is a 84 y.o. female   Summary of history :  She is here today to see me for a follow up for iron deficiency anemia.   She was admitted on 03/02/2018 when  found to be anemic incidentally on routine lab work.  She denies any NSAID use, abdominal pain.  Hemoglobin on admission was 6.9 g a few months prior it was 9.1 g.  Vitamin B12, folate levels were normal.  Very low iron levels with a ferritin of 10. Performed an upper endoscopy and colonoscopy on 03/03/2018.  A large AVM in the cecum which I ablated with APC.  The internal hemorrhoids were noted.  The upper endoscopy demonstrated a large duodenal diverticulum multiple nonbleeding ulcers in the duodenum.  This showed erosive and reactive duodenitis.  Noted with erosive gastritis.  6 weeks later on 04/26/2018.  Upper endoscopy showed the duodenum had healed completely.  5 cm hiatal hernia noted.03/16/2018: H. pylori stool antigen negative.      Interval history 04/28/2018-08/16/18   08/2018: Hb 11.9 grams   03/30/2018 2019 hemoglobin 10.6 g.Iron studies have normalized. Denies use of NSAID's, on iron pills, doing well.      Current Outpatient Medications  Medication Sig Dispense Refill  . Acetaminophen (TYLENOL ARTHRITIS PAIN PO) Take by mouth 2 (two) times daily as needed.    Marland Kitchen amLODipine-benazepril (LOTREL) 5-20 MG capsule TAKE 1 CAPSULE BY MOUTH  DAILY 90 capsule 3  . cholecalciferol (VITAMIN D) 1000 units tablet Take 1,000 Units by mouth daily.    Marland Kitchen docusate sodium (COLACE) 100 MG capsule Take 100 mg by mouth daily.    . ferrous sulfate 325 (65 FE) MG EC tablet Take 1 tablet (325 mg total)  by mouth 2 (two) times daily with a meal. 180 tablet 1  . Multiple Vitamin (MULTIVITAMIN WITH MINERALS) TABS tablet Take 1 tablet by mouth daily. CENTRUM SILVER    . Omega-3 Fatty Acids (FISH OIL) 1000 MG CAPS Take 1,000-2,000 mg by mouth daily. TAKE 2 CAPSULES IN THE MORNING & 1 CAPSULE AT NIGHT    . omeprazole (PRILOSEC) 20 MG capsule Take 1 capsule (20 mg total) by mouth daily. 90 capsule 3  . simvastatin (ZOCOR) 20 MG tablet TAKE 1 TABLET BY MOUTH  EVERY MORNING (Patient taking differently: TAKE 1 TABLET BY MOUTH  EVERY EVENING) 90 tablet 3  . omeprazole (PRILOSEC) 40 MG capsule Take 1 capsule (40 mg total) by mouth 2 (two) times daily. (Patient not taking: Reported on 08/16/2018) 60 capsule 1   Current Facility-Administered Medications  Medication Dose Route Frequency Provider Last Rate Last Dose  . albuterol (PROVENTIL) (2.5 MG/3ML) 0.083% nebulizer solution 2.5 mg  2.5 mg Nebulization Once Burnard Hawthorne, FNP        Allergies as of 08/16/2018 - Review Complete 08/16/2018  Allergen Reaction Noted  . Adhesive [tape]  11/17/2017  . Codeine Itching 12/26/2011    ROS:  General: Negative for anorexia, weight loss, fever, chills, fatigue, weakness. ENT: Negative for hoarseness, difficulty swallowing , nasal congestion. CV: Negative for chest pain, angina, palpitations, dyspnea on exertion, peripheral edema.  Respiratory: Negative for dyspnea  at rest, dyspnea on exertion, cough, sputum, wheezing.  GI: See history of present illness. GU:  Negative for dysuria, hematuria, urinary incontinence, urinary frequency, nocturnal urination.  Endo: Negative for unusual weight change.    Physical Examination:   BP 132/66   Pulse 78   Ht 4\' 10"  (1.473 m)   Wt 145 lb 12.8 oz (66.1 kg)   BMI 30.47 kg/m   General: Well-nourished, well-developed in no acute distress.  Eyes: No icterus. Conjunctivae pink. Mouth: Oropharyngeal mucosa moist and pink , no lesions erythema or exudate. Lungs:  Clear to auscultation bilaterally. Non-labored. Heart: Regular rate and rhythm, systolic murmur in aortic area, no rubs or gallops.  Abdomen: Bowel sounds are normal, nontender, nondistended, no hepatosplenomegaly or masses, no abdominal bruits or hernia , no rebound or guarding.   Extremities: No lower extremity edema. No clubbing or deformities. Neuro: Alert and oriented x 3.  Grossly intact. Skin: Warm and dry, no jaundice.   Psych: Alert and cooperative, normal mood and affect.   Imaging Studies: Dg Shoulder Right  Result Date: 08/10/2018 CLINICAL DATA:  Fall. EXAM: RIGHT SHOULDER - 2+ VIEW COMPARISON:  11/22/2017. FINDINGS: Total right shoulder replacement. Hardware intact. Anatomic alignment. Acromioclavicular degenerative change. No acute abnormality. No evidence of fracture, dislocation, or separation. IMPRESSION: Total right shoulder replacement with anatomic alignment. Acromioclavicular degenerative change. No acute abnormality. Electronically Signed   By: Marcello Moores  Register   On: 08/10/2018 15:27   Dg Elbow Complete Right  Result Date: 08/10/2018 CLINICAL DATA:  Recent fall with right elbow pain, initial encounter EXAM: RIGHT ELBOW - COMPLETE 3+ VIEW COMPARISON:  None. FINDINGS: There is no evidence of fracture, dislocation, or joint effusion. There is no evidence of arthropathy or other focal bone abnormality. Soft tissues are unremarkable. IMPRESSION: No acute abnormality noted. Electronically Signed   By: Inez Catalina M.D.   On: 08/10/2018 15:26   Dg Humerus Right  Result Date: 08/10/2018 CLINICAL DATA:  Recent fall with right arm pain, initial encounter EXAM: RIGHT HUMERUS - 2+ VIEW COMPARISON:  11/20/2017 FINDINGS: There are changes consistent with right shoulder replacement. The prosthesis is well seated. No fracture or dislocation is seen. Some degenerative changes about the acromioclavicular joint are noted. IMPRESSION: Postsurgical change without acute abnormality.  Electronically Signed   By: Inez Catalina M.D.   On: 08/10/2018 15:25    Assessment and Plan:   Robin Alexander is a 84 y.o. y/o femalehere to follow-up for anemia when she was seen at the hospital on 03/02/2018.  He had a iron deficiency anemia.  EGD plus colonoscopy in July 2019  Showed 1  cecal AVM that was treated and multiple bleeding nonbleeding ulcers in the duodenum.  Repeat EGD 6 weeks later showed that the duodenum completely healed.  she has a history of known aortic stenosis. Iron studies are now normal with an inmproved Hb.   Plan  1.  Repeat hemoglobin in 3-4 months  2. No NSAIDs. 3.  Continue on 20 mg once a day long term due to large hiatal hernia  4.  If there is a further drop in hemoglobin then at that point we would consider capsule study of the small bowel as aortic stenosis is associated with small bowel AVM's and anemia. .   Dr Robin Bellows  MD,MRCP [facility]) Follow up in 4 months

## 2018-08-17 DIAGNOSIS — M25511 Pain in right shoulder: Secondary | ICD-10-CM | POA: Diagnosis not present

## 2018-09-02 ENCOUNTER — Encounter: Payer: Self-pay | Admitting: Cardiovascular Disease

## 2018-09-02 ENCOUNTER — Ambulatory Visit: Payer: Medicare Other | Admitting: Cardiovascular Disease

## 2018-09-02 VITALS — BP 135/86 | HR 72 | Ht 60.0 in | Wt 145.4 lb

## 2018-09-02 DIAGNOSIS — I10 Essential (primary) hypertension: Secondary | ICD-10-CM

## 2018-09-02 DIAGNOSIS — I059 Rheumatic mitral valve disease, unspecified: Secondary | ICD-10-CM | POA: Diagnosis not present

## 2018-09-02 DIAGNOSIS — I350 Nonrheumatic aortic (valve) stenosis: Secondary | ICD-10-CM

## 2018-09-02 DIAGNOSIS — E785 Hyperlipidemia, unspecified: Secondary | ICD-10-CM | POA: Diagnosis not present

## 2018-09-02 NOTE — Progress Notes (Signed)
Cardiology Office Note   Date:  09/02/2018   ID:  Robin Alexander, DOB [birth year], MRN [id number]  PCP:  Robin Haven, MD  Cardiologist:   Robin Sacramento, MD   Chief Complaint  Patient presents with  . other    6 month follow up. Meds reviewed by the pt. verbally. "doing well."       History of Present Illness: Robin Alexander is a 85 y.o. female who presents for a follow up visit regarding aortic stenosis.  She has known history of hypertension, hyperlipidemia and gastroesophageal reflux disease. She has right bundle branch block on her EKG.  Treadmill stress test in October of 2014 showed no evidence of ischemia.  Most recent echocardiogram in March 2019 showed normal LV systolic function, stable moderate aortic stenosis with a mean gradient of 28, mild to moderate mitral stenosis and mild mitral regurgitation. The patient underwent shoulder surgery last year.  She was hospitalized after that due to weakness thought to be due to medications.  She had GI bleed in July that required hospitalization and transfusion.  GI evaluation revealed a bleeding gastric ulcer that was cauterized.  Her anemia has almost completely resolved since then. She has been doing well with no recent chest pain, shortness of breath, palpitations or dizziness.  Past Medical History:  Diagnosis Date  . Abnormal CT scan, chest 03/20/2014  . Anemia   . Arthritis    shoulders  . Chicken pox   . Complication of anesthesia 2016   slow to wake up after anesthesia  . Dyspnea    uses inhaler when she gets bronchitis  . GERD (gastroesophageal reflux disease)   . Heart murmur   . High cholesterol   . HTN (hypertension)   . Multiple duodenal ulcers   . Pneumonia    spring 2015    Past Surgical History:  Procedure Laterality Date  . ABDOMINAL HYSTERECTOMY     in 50's  . ANTERIOR LAT LUMBAR FUSION Right 02/28/2015   Procedure: ANTERIOR LATERAL LUMBAR FUSION 1 LEVEL;  Surgeon: Robin Bob, MD;   Location: [facility];  Service: Orthopedics;  Laterality: Right;  Right sided lumbar 3-4 lateral interbody fusion  . APPENDECTOMY  1950  . BACK SURGERY     rod in back  . BICEPT TENODESIS Right 11/20/2017   Procedure: BICEPS TENODESIS;  Surgeon: Leim Fabry, MD;  Location: [facility];  Service: Orthopedics;  Laterality: Right;  . CATARACT EXTRACTION W/ INTRAOCULAR LENS  IMPLANT, BILATERAL Bilateral 2011  . CESAREAN SECTION     2  . COLONOSCOPY WITH PROPOFOL N/A 03/03/2018   Procedure: COLONOSCOPY WITH PROPOFOL;  Surgeon: Jonathon Bellows, MD;  Location: [facility];  Service: Gastroenterology;  Laterality: N/A;  . ESOPHAGOGASTRODUODENOSCOPY (EGD) WITH PROPOFOL N/A 03/03/2018   Procedure: ESOPHAGOGASTRODUODENOSCOPY (EGD) WITH PROPOFOL;  Surgeon: Jonathon Bellows, MD;  Location: [facility];  Service: Gastroenterology;  Laterality: N/A;  . ESOPHAGOGASTRODUODENOSCOPY (EGD) WITH PROPOFOL N/A 04/26/2018   Procedure: ESOPHAGOGASTRODUODENOSCOPY (EGD) WITH PROPOFOL;  Surgeon: Jonathon Bellows, MD;  Location: [facility];  Service: Gastroenterology;  Laterality: N/A;  . JOINT REPLACEMENT    . REVERSE SHOULDER ARTHROPLASTY Right 11/20/2017   Procedure: REVERSE SHOULDER ARTHROPLASTY;  Surgeon: Leim Fabry, MD;  Location: [facility];  Service: Orthopedics;  Laterality: Right;  . ROTATOR CUFF REPAIR Right    right shoulder   10 YRS  . TOTAL HIP ARTHROPLASTY Bilateral 2005, 2006   Bilateral, [place]     Current Outpatient Medications  Medication Sig Dispense  Refill  . Acetaminophen (TYLENOL ARTHRITIS PAIN PO) Take by mouth 2 (two) times daily as needed.    Marland Kitchen amLODipine-benazepril (LOTREL) 5-20 MG capsule TAKE 1 CAPSULE BY MOUTH  DAILY 90 capsule 3  . cholecalciferol (VITAMIN D) 1000 units tablet Take 1,000 Units by mouth daily.    Marland Kitchen docusate sodium (COLACE) 100 MG capsule Take 100 mg by mouth daily.    . ferrous sulfate 325 (65 FE) MG EC tablet Take 1 tablet (325 mg total) by mouth 2 (two) times daily with a meal.  180 tablet 1  . Multiple Vitamin (MULTIVITAMIN WITH MINERALS) TABS tablet Take 1 tablet by mouth daily. CENTRUM SILVER    . Omega-3 Fatty Acids (FISH OIL) 1000 MG CAPS Take 1,000-2,000 mg by mouth daily. TAKE 2 CAPSULES IN THE MORNING & 1 CAPSULE AT NIGHT    . omeprazole (PRILOSEC) 20 MG capsule Take 1 capsule (20 mg total) by mouth daily. 90 capsule 3  . simvastatin (ZOCOR) 20 MG tablet TAKE 1 TABLET BY MOUTH  EVERY MORNING (Patient taking differently: TAKE 1 TABLET BY MOUTH  EVERY EVENING) 90 tablet 3   Current Facility-Administered Medications  Medication Dose Route Frequency Provider Last Rate Last Dose  . albuterol (PROVENTIL) (2.5 MG/3ML) 0.083% nebulizer solution 2.5 mg  2.5 mg Nebulization Once Burnard Hawthorne, FNP        Allergies:   Adhesive [tape] and Codeine    Social History:  The patient  reports that she quit smoking about 35 years ago. Her smoking use included cigarettes. She has a 15.00 pack-year smoking history. She has never used smokeless tobacco. She reports current alcohol use. She reports that she does not use drugs.   Family History:  The patient's family history includes Arthritis in her father and mother; Heart disease in her brother and mother; Stroke in her brother.    ROS:  Please see the history of present illness.   Otherwise, review of systems are positive for none.   All other systems are reviewed and negative.    PHYSICAL EXAM: VS:  BP 135/86 (BP Location: Left Arm, Patient Position: Sitting, Cuff Size: Normal)   Pulse 72   Ht 5' (1.524 m)   Wt 145 lb 6.4 oz (66 kg)   BMI 28.40 kg/m  , BMI Body mass index is 28.4 kg/m. GEN: Well nourished, well developed, in no acute distress  HEENT: normal  Neck: no JVD, carotid bruits, or masses Cardiac: RRR; no  rubs, or gallops,no edema . 3/6 crescendo decrescendo systolic murmur in the aortic area which is mid peaking.  S2 is mildly diminished. Respiratory:  clear to auscultation bilaterally, normal work of  breathing GI: soft, nontender, nondistended, + BS MS: no deformity or atrophy  Skin: warm and dry, no rash Neuro:  Strength and sensation are intact Psych: euthymic mood, full affect   EKG:  EKG is ordered today. The ekg ordered today demonstrates normal sinus rhythm with right bundle branch block.   Recent Labs: 03/02/2018: ALT 17 08/10/2018: BUN 35; Creatinine, Ser 1.14; Hemoglobin 11.9; Platelets 205.0; Potassium 4.5; Sodium 139    Lipid Panel    Component Value Date/Time   CHOL 123 11/22/2017 2010   TRIG 92 11/22/2017 2010   HDL 36 (L) 11/22/2017 2010   CHOLHDL 3.4 11/22/2017 2010   VLDL 18 11/22/2017 2010   LDLCALC 69 11/22/2017 2010      Wt Readings from Last 3 Encounters:  09/02/18 145 lb 6.4 oz (66 kg)  08/16/18  145 lb 12.8 oz (66.1 kg)  08/10/18 148 lb 9.6 oz (67.4 kg)       No flowsheet data found.    ASSESSMENT AND PLAN:   1.  Moderate aortic stenosis: She continues to be asymptomatic.  Her exam is not different from before.  She is due for follow-up echocardiogram in March of this year which was requested.  2.  Mild to moderate mitral stenosis with mild regurgitation: Monitor with next echocardiogram.  3. Hyperlipidemia: Currently on simvastatin 20 mg daily.  Most recent LDL was 69.  4. Essential hypertension: Blood pressure is controlled on current medications.   Disposition:   FU with me in 6 months.  Signed,  Robin Sacramento, MD  09/02/2018 11:53 AM    [place]

## 2018-09-02 NOTE — Patient Instructions (Signed)
Medication Instructions:  No changes If you need a refill on your cardiac medications before your next appointment, please call your pharmacy.   Lab work: None ordered.  Testing/Procedures: Your physician has requested that you have an echocardiogram in March. Echocardiography is a painless test that uses sound waves to create images of your heart. It provides your doctor with information about the size and shape of your heart and how well your heart's chambers and valves are working. You may receive an ultrasound enhancing agent through an IV if needed to better visualize your heart during the echo.This procedure takes approximately one hour. There are no restrictions for this procedure. This will take place at the [facility] clinic.    Follow-Up: At [facility], you and your health needs are our priority.  As part of our continuing mission to provide you with exceptional heart care, we have created designated Provider Care Teams.  These Care Teams include your primary Cardiologist (physician) and Advanced Practice Providers (APPs -  Physician Assistants and Nurse Practitioners) who all work together to provide you with the care you need, when you need it. You will need a follow up appointment in 6 months.  Please call our office 2 months in advance to schedule this appointment.  You may see Dr. Fletcher Anon or one of the following Advanced Practice Providers on your designated Care Team:   Murray Hodgkins, NP Christell Faith, PA-C

## 2018-09-08 ENCOUNTER — Telehealth: Payer: Self-pay | Admitting: Gastroenterology

## 2018-09-08 NOTE — Telephone Encounter (Signed)
Pt left vm for Dr. Vicente Males she was Given rx Meloxicam and has a history of ulcers and wants to make sure its ok to take this rx

## 2018-09-09 NOTE — Telephone Encounter (Signed)
Robin Alexander inform - she is at very high risk for ulcers as she has had them in the past, advise is to avoid if possible. If taking for a short period <1 week then suggest daily pepcid 20 mg BID along with meloxicam

## 2018-09-09 NOTE — Telephone Encounter (Signed)
Called pt in to inform her of Dr. Georgeann Oppenheim suggestions regarding pt taking Meloxicam.  Unable to contact, LVM to return call

## 2018-09-10 ENCOUNTER — Telehealth: Payer: Self-pay | Admitting: Gastroenterology

## 2018-09-10 NOTE — Telephone Encounter (Signed)
Patient called to ask Dr Vicente Males if it's safe to take Meloxicam (another physician prescribed) with her hx of a bleeding ulcer?

## 2018-09-10 NOTE — Telephone Encounter (Signed)
Spoke with pt and informed her of Dr. Georgeann Oppenheim directions regarding pt taking the Meloxicam.

## 2018-09-24 ENCOUNTER — Telehealth: Payer: Self-pay | Admitting: Radiology

## 2018-09-24 DIAGNOSIS — D500 Iron deficiency anemia secondary to blood loss (chronic): Secondary | ICD-10-CM

## 2018-09-24 NOTE — Telephone Encounter (Signed)
Pt coming in for labs Monday, please place future orders. Thank you 

## 2018-09-24 NOTE — Addendum Note (Signed)
Addended by: Leone Haven on: 09/24/2018 05:42 PM   Modules accepted: Orders

## 2018-09-27 ENCOUNTER — Other Ambulatory Visit (INDEPENDENT_AMBULATORY_CARE_PROVIDER_SITE_OTHER): Payer: Medicare Other

## 2018-09-27 DIAGNOSIS — D500 Iron deficiency anemia secondary to blood loss (chronic): Secondary | ICD-10-CM | POA: Diagnosis not present

## 2018-09-27 LAB — CBC
HEMATOCRIT: 36.6 % (ref 36.0–46.0)
HEMOGLOBIN: 12.4 g/dL (ref 12.0–15.0)
MCHC: 33.9 g/dL (ref 30.0–36.0)
MCV: 97.2 fl (ref 78.0–100.0)
PLATELETS: 202 10*3/uL (ref 150.0–400.0)
RBC: 3.77 Mil/uL — ABNORMAL LOW (ref 3.87–5.11)
RDW: 13.3 % (ref 11.5–15.5)
WBC: 7.9 10*3/uL (ref 4.0–10.5)

## 2018-09-28 LAB — IRON,TIBC AND FERRITIN PANEL
%SAT: 38 % (calc) (ref 16–45)
FERRITIN: 170 ng/mL (ref 16–288)
Iron: 121 ug/dL (ref 45–160)
TIBC: 316 mcg/dL (calc) (ref 250–450)

## 2018-09-30 ENCOUNTER — Other Ambulatory Visit: Payer: Self-pay | Admitting: Family Medicine

## 2018-09-30 DIAGNOSIS — D500 Iron deficiency anemia secondary to blood loss (chronic): Secondary | ICD-10-CM

## 2018-10-16 ENCOUNTER — Other Ambulatory Visit: Payer: Self-pay | Admitting: Family

## 2018-10-16 DIAGNOSIS — L299 Pruritus, unspecified: Secondary | ICD-10-CM

## 2018-10-28 ENCOUNTER — Other Ambulatory Visit (INDEPENDENT_AMBULATORY_CARE_PROVIDER_SITE_OTHER): Payer: Medicare Other

## 2018-10-28 DIAGNOSIS — D500 Iron deficiency anemia secondary to blood loss (chronic): Secondary | ICD-10-CM

## 2018-10-28 LAB — CBC
HEMATOCRIT: 35 % — AB (ref 36.0–46.0)
Hemoglobin: 12.4 g/dL (ref 12.0–15.0)
MCHC: 35.4 g/dL (ref 30.0–36.0)
MCV: 98.1 fl (ref 78.0–100.0)
Platelets: 186 10*3/uL (ref 150.0–400.0)
RBC: 3.57 Mil/uL — ABNORMAL LOW (ref 3.87–5.11)
RDW: 12.8 % (ref 11.5–15.5)
WBC: 7.5 10*3/uL (ref 4.0–10.5)

## 2018-10-29 LAB — IRON,TIBC AND FERRITIN PANEL
%SAT: 42 % (ref 16–45)
FERRITIN: 148 ng/mL (ref 16–288)
IRON: 135 ug/dL (ref 45–160)
TIBC: 318 ug/dL (ref 250–450)

## 2018-11-04 ENCOUNTER — Other Ambulatory Visit: Payer: Medicare Other

## 2018-11-18 ENCOUNTER — Ambulatory Visit: Payer: Medicare Other | Admitting: Gastroenterology

## 2018-12-02 ENCOUNTER — Other Ambulatory Visit: Payer: Medicare Other

## 2018-12-03 ENCOUNTER — Other Ambulatory Visit: Payer: Medicare Other

## 2018-12-22 ENCOUNTER — Ambulatory Visit (INDEPENDENT_AMBULATORY_CARE_PROVIDER_SITE_OTHER): Payer: Medicare Other | Admitting: Gastroenterology

## 2018-12-22 DIAGNOSIS — D509 Iron deficiency anemia, unspecified: Secondary | ICD-10-CM

## 2018-12-22 NOTE — Progress Notes (Signed)
Robin Alexander , MD [address]  [place]  [place], [place] [address]  Main: [phone]  Fax: [phone]   Primary Care Physician: Leone Haven, MD  Virtual Visit via Video Note  I connected with patient on 12/22/18 at 10:30 AM EDT by video and verified that I am speaking with the correct person using two identifiers.   I discussed the limitations, risks, security and privacy concerns of performing an evaluation and management service by video  and the availability of in person appointments. I also discussed with the patient that there may be a patient responsible charge related to this service. The patient expressed understanding and agreed to proceed.  Location of Patient: Home Location of Provider: Home Persons involved: Patient and provider only   History of Present Illness: Chief Complaint  Patient presents with  . Follow-up    Iron deficiency anemia    HPI: Robin Alexander is a 85 y.o. female   Summary of history :  She is here today to see me for a follow up for iron deficiency anemia.  She was admitted on 03/02/2018 when  found to have iron deficiency anemia incidentally on routine lab work. She denies any NSAID use, abdominal pain. Hemoglobin on admission was 6.9 g a few months prior it was 9.1 g. .  Performed an upper endoscopy and colonoscopy on 03/03/2018. A large AVM in the cecum which I ablated with APC. The internal hemorrhoids were noted. The upper endoscopy demonstrated a large duodenal diverticulum multiple nonbleeding ulcers in the duodenum. This showed erosive and reactive duodenitis. Noted with erosive gastritis. 6weeks later on 04/26/2018. Upper endoscopy showed the duodenum had healed completely. 5 cm hiatal hernia noted.03/16/2018: H. pylori stool antigen negative.    08/2018: Hb 11.9 grams   Interval history12/16/19 -12/22/2018  10/28/2018: HB 12.4 grams, MCV 98,  Iron studies normalized in 09/2018   Stopped taking  her iron tablets   No complaints. Doing well   Current Outpatient Medications  Medication Sig Dispense Refill  . Acetaminophen (TYLENOL ARTHRITIS PAIN PO) Take by mouth 2 (two) times daily as needed.    Marland Kitchen amLODipine-benazepril (LOTREL) 5-20 MG capsule TAKE 1 CAPSULE BY MOUTH  DAILY 90 capsule 3  . cholecalciferol (VITAMIN D) 1000 units tablet Take 1,000 Units by mouth daily.    Marland Kitchen docusate sodium (COLACE) 100 MG capsule Take 100 mg by mouth daily.    Marland Kitchen loratadine (CLARITIN) 10 MG tablet TAKE 1 TABLET BY MOUTH EVERY DAY 30 tablet 1  . Multiple Vitamin (MULTIVITAMIN WITH MINERALS) TABS tablet Take 1 tablet by mouth daily. CENTRUM SILVER    . Omega-3 Fatty Acids (FISH OIL) 1000 MG CAPS Take 1,000-2,000 mg by mouth daily. TAKE 2 CAPSULES IN THE MORNING & 1 CAPSULE AT NIGHT    . omeprazole (PRILOSEC) 20 MG capsule Take 1 capsule (20 mg total) by mouth daily. 90 capsule 3  . simvastatin (ZOCOR) 20 MG tablet TAKE 1 TABLET BY MOUTH  EVERY MORNING (Patient taking differently: TAKE 1 TABLET BY MOUTH  EVERY EVENING) 90 tablet 3  . ferrous sulfate 325 (65 FE) MG EC tablet Take 1 tablet (325 mg total) by mouth 2 (two) times daily with a meal. 180 tablet 1   Current Facility-Administered Medications  Medication Dose Route Frequency Provider Last Rate Last Dose  . albuterol (PROVENTIL) (2.5 MG/3ML) 0.083% nebulizer solution 2.5 mg  2.5 mg Nebulization Once Burnard Hawthorne, FNP        Allergies  as of 12/22/2018 - Review Complete 09/02/2018  Allergen Reaction Noted  . Adhesive [tape]  11/17/2017  . Codeine Itching 12/26/2011    Review of Systems:    All systems reviewed and negative except where noted in HPI.  General Appearance:    Alert, cooperative, no distress, appears stated age  Head:    Normocephalic, without obvious abnormality, atraumatic  Eyes:    PERRL, conjunctiva/corneas clear,  Ears:    Grossly normal hearing    Neurologic:  Grossly normal    Observations/Objective:  Labs: CMP      Component Value Date/Time   NA 139 08/10/2018 1100   K 4.5 08/10/2018 1100   CL 103 08/10/2018 1100   CO2 27 08/10/2018 1100   GLUCOSE 121 (H) 08/10/2018 1100   BUN 35 (H) 08/10/2018 1100   CREATININE 1.14 08/10/2018 1100   CREATININE 1.30 12/27/2013 1114   CALCIUM 10.4 08/10/2018 1100   PROT 7.9 03/02/2018 1233   ALBUMIN 4.2 03/02/2018 1233   AST 22 03/02/2018 1233   ALT 17 03/02/2018 1233   ALKPHOS 74 03/02/2018 1233   BILITOT 0.7 03/02/2018 1233   GFRNONAA 48 (L) 03/03/2018 0454   GFRNONAA 39 (L) 12/27/2013 1114   GFRAA 55 (L) 03/03/2018 0454   GFRAA 45 (L) 12/27/2013 1114   Lab Results  Component Value Date   WBC 7.5 10/28/2018   HGB 12.4 10/28/2018   HCT 35.0 (L) 10/28/2018   MCV 98.1 10/28/2018   PLT 186.0 10/28/2018    Imaging Studies: No results found.  Assessment and Plan:   Robin Alexander is a 85 y.o. y/o female ehere to follow-up for anemia when she was seen at the hospital on 03/02/2018. She had a iron deficiency anemia. EGD plus colonoscopy in July 2019  Showed 1 cecal AVM that was treated and multiple bleeding nonbleeding ulcers in the duodenum. Repeat EGD 6 weeks later showed that the duodenum completely healed. she has a history of known aortic stenosis.Iron studies and Hb  are now normal  Plan  1. No NSAIDs. 2. Continue on 20 mg once a day long term due to large hiatal hernia 3.If there is a further drop in hemoglobin then at that point we would consider capsule study of the small bowel as aortic stenosis is associated with small bowel AVM's and anemia. .   F/u as needed    I discussed the assessment and treatment plan with the patient. The patient was provided an opportunity to ask questions and all were answered. The patient agreed with the plan and demonstrated an understanding of the instructions.   The patient was advised to call back or seek an in-person evaluation if the symptoms worsen or if the condition fails to improve as  anticipated.   Dr Robin Bellows MD,MRCP [facility]) Gastroenterology/Hepatology Pager: [phone]   Speech recognition software was used to dictate this note.

## 2018-12-28 DIAGNOSIS — N183 Chronic kidney disease, stage 3 (moderate): Secondary | ICD-10-CM | POA: Diagnosis not present

## 2018-12-28 DIAGNOSIS — I129 Hypertensive chronic kidney disease with stage 1 through stage 4 chronic kidney disease, or unspecified chronic kidney disease: Secondary | ICD-10-CM | POA: Diagnosis not present

## 2018-12-29 ENCOUNTER — Ambulatory Visit: Payer: Medicare Other | Admitting: Gastroenterology

## 2019-01-21 ENCOUNTER — Other Ambulatory Visit: Payer: Self-pay

## 2019-01-21 ENCOUNTER — Ambulatory Visit: Payer: Self-pay | Admitting: *Deleted

## 2019-01-21 ENCOUNTER — Ambulatory Visit: Payer: Medicare Other

## 2019-01-21 DIAGNOSIS — K922 Gastrointestinal hemorrhage, unspecified: Secondary | ICD-10-CM

## 2019-01-21 DIAGNOSIS — E785 Hyperlipidemia, unspecified: Secondary | ICD-10-CM

## 2019-01-21 NOTE — Telephone Encounter (Signed)
Patient has been sneezing but usually sneezes through the spring, X 3 weeks fever today at Echocardiogram was screened and temp was 100.8, staff then check temp with  Second thermometer and reading was 99.4 , patient was advised to go home and call office. I advised patient to check temp and I will call her back to see what her temp is on home thermometer. Patient temperature at home is 97.7 patient staed she feels fine. Advised I had spoken with physician in office and was agreed to have her monitor at home and if fever spikes or symptoms develop she will need to be evaluated.

## 2019-01-21 NOTE — Telephone Encounter (Signed)
Patient went for her ECHO today and was told she has fever- and to call PCP.Patient screening for COVID done and office contacted per protocol.  Reason for Disposition . HIGH RISK patient (e.g., age > 41 years, diabetes, heart or lung disease, weak immune system)  Answer Assessment - Initial Assessment Questions 1. COVID-19 DIAGNOSIS: "Who made your Coronavirus (COVID-19) diagnosis?" "Was it confirmed by a positive lab test?" If not diagnosed by a HCP, ask "Are there lots of cases (community spread) where you live?" (See public health department website, if unsure)   * MAJOR community spread: high number of cases; numbers of cases are increasing; many people hospitalized.   * MINOR community spread: low number of cases; not increasing; few or no people hospitalized     minor 2. ONSET: "When did the COVID-19 symptoms start?"      today 3. WORST SYMPTOM: "What is your worst symptom?" (e.g., cough, fever, shortness of breath, muscle aches)     Fever, rare cough or sneeze 4. COUGH: "Do you have a cough?" If so, ask: "How bad is the cough?"       Coughs- not bothersone 5. FEVER: "Do you have a fever?" If so, ask: "What is your temperature, how was it measured, and when did it start?"     Taken today- 99.4,100.9- forehead 6. RESPIRATORY STATUS: "Describe your breathing?" (e.g., shortness of breath, wheezing, unable to speak)      No breathing problems- she does have history os bronchitis- but no flare this year 7. BETTER-SAME-WORSE: "Are you getting better, staying the same or getting worse compared to yesterday?"  If getting worse, ask, "In what way?"     Stay same 8. HIGH RISK DISEASE: "Do you have any chronic medical problems?" (e.g., asthma, heart or lung disease, weak immune system, etc.)     Age, heart murmer 9. PREGNANCY: "Is there any chance you are pregnant?" "When was your last menstrual period?"     n/a 10. OTHER SYMPTOMS: "Do you have any other symptoms?"  (e.g., runny nose, headache,  sore throat, loss of smell)       sneezing  Protocols used: CORONAVIRUS (COVID-19) DIAGNOSED OR SUSPECTED-A-AH

## 2019-01-22 NOTE — Telephone Encounter (Signed)
Please follow-up with the patient to see how she is feeling when we are back in the office. Thanks.

## 2019-01-25 NOTE — Addendum Note (Signed)
Addended by: Leone Haven on: 01/25/2019 05:10 PM   Modules accepted: Orders

## 2019-01-25 NOTE — Telephone Encounter (Signed)
Orders placed.  Okay for her to be scheduled for labs.

## 2019-01-25 NOTE — Telephone Encounter (Signed)
Called and spoke to pt.  Pt said that she was feeling well.  Denied having a fever and no other symptoms.  Pt requested to have her hemoglobin checked and labs drawn prior to her appt on 02/09/19.  Pt will wait to hear back if PCP puts in orders to schedule a lab appt.

## 2019-02-02 ENCOUNTER — Other Ambulatory Visit: Payer: Medicare Other

## 2019-02-08 ENCOUNTER — Telehealth: Payer: Self-pay

## 2019-02-08 NOTE — Telephone Encounter (Signed)
Copied from [place] [id number]. Topic: Appointment Scheduling - Scheduling Inquiry for Clinic >> Feb 07, 2019  4:02 PM Oneta Rack wrote: Patient returning call regarding her 02/09/2019 appointment, please advise

## 2019-02-09 ENCOUNTER — Other Ambulatory Visit: Payer: Self-pay

## 2019-02-09 ENCOUNTER — Ambulatory Visit (INDEPENDENT_AMBULATORY_CARE_PROVIDER_SITE_OTHER): Payer: Medicare Other | Admitting: Family Medicine

## 2019-02-09 ENCOUNTER — Encounter: Payer: Self-pay | Admitting: Family Medicine

## 2019-02-09 DIAGNOSIS — I10 Essential (primary) hypertension: Secondary | ICD-10-CM

## 2019-02-09 DIAGNOSIS — R262 Difficulty in walking, not elsewhere classified: Secondary | ICD-10-CM | POA: Diagnosis not present

## 2019-02-09 DIAGNOSIS — E785 Hyperlipidemia, unspecified: Secondary | ICD-10-CM

## 2019-02-09 NOTE — Assessment & Plan Note (Signed)
Adequately controlled.  Continue current medication.  We will check lab work next week when she comes to the office.

## 2019-02-09 NOTE — Assessment & Plan Note (Signed)
Continue simvastatin.  Check lab work next week.

## 2019-02-09 NOTE — Progress Notes (Signed)
Virtual Visit via video Note  This visit type was conducted due to national recommendations for restrictions regarding the COVID-19 pandemic (e.g. social distancing).  This format is felt to be most appropriate for this patient at this time.  All issues noted in this document were discussed and addressed.  No physical exam was performed (except for noted visual exam findings with Video Visits).   I connected with Robin Alexander today at 10:00 AM EDT by a video enabled telemedicine application and verified that I am speaking with the correct person using two identifiers. Location patient: home Location provider: work  Persons participating in the virtual visit: patient, provider, Jaimarie Rapozo (husband)  I discussed the limitations, risks, security and privacy concerns of performing an evaluation and management service by telephone and the availability of in person appointments. I also discussed with the patient that there may be a patient responsible charge related to this service. The patient expressed understanding and agreed to proceed.   Alexander for visit: follow-up  HPI: HYPERTENSION  Disease Monitoring  Home BP Monitoring 139/66 today Chest pain- no    Dyspnea- no Medications  Compliance-  Taking amlodipine, HCTZ.  Edema- left foot swelling is chronic and unchanged  HYPERLIPIDEMIA Symptoms Chest pain on exertion:  no    Medications: Compliance- taking simvastatin Right upper quadrant pain- no  Muscle aches- no  Difficulty walking: Patient notes this has been going on for a year or so.  She notes she is unsure if it is a balance issue or if it is related to her joints.  She does note some chronic back, leg, and knee pain as well as hip pain.  She notes no recent falls.  She is not shuffling her feet.  She does note when she is sitting down that she has difficulty flexing her hips to lift her legs up.  She has some chronic numbness in her left toes.  No focal weakness in her legs.   No weakness in arms.  No dizziness or lightheadedness.  She is able to stand well.   ROS: See pertinent positives and negatives per HPI.  Past Medical History:  Diagnosis Date   Abnormal CT scan, chest 03/20/2014   Anemia    Arthritis    shoulders   Chicken pox    Complication of anesthesia 2016   slow to wake up after anesthesia   Dyspnea    uses inhaler when she gets bronchitis   GERD (gastroesophageal reflux disease)    Heart murmur    High cholesterol    HTN (hypertension)    Multiple duodenal ulcers    Pneumonia    spring 2015    Past Surgical History:  Procedure Laterality Date   ABDOMINAL HYSTERECTOMY     in 50's   ANTERIOR LAT LUMBAR FUSION Right 02/28/2015   Procedure: ANTERIOR LATERAL LUMBAR FUSION 1 LEVEL;  Surgeon: Phylliss Bob, MD;  Location: [place];  Service: Orthopedics;  Laterality: Right;  Right sided lumbar 3-4 lateral interbody fusion   APPENDECTOMY  1950   BACK SURGERY     rod in back   BICEPT TENODESIS Right 11/20/2017   Procedure: BICEPS TENODESIS;  Surgeon: Leim Fabry, MD;  Location: [facility];  Service: Orthopedics;  Laterality: Right;   CATARACT EXTRACTION W/ INTRAOCULAR LENS  IMPLANT, BILATERAL Bilateral 2011   CESAREAN SECTION     2   COLONOSCOPY WITH PROPOFOL N/A 03/03/2018   Procedure: COLONOSCOPY WITH PROPOFOL;  Surgeon: Jonathon Bellows, MD;  Location: [facility]  ENDOSCOPY;  Service: Gastroenterology;  Laterality: N/A;   ESOPHAGOGASTRODUODENOSCOPY (EGD) WITH PROPOFOL N/A 03/03/2018   Procedure: ESOPHAGOGASTRODUODENOSCOPY (EGD) WITH PROPOFOL;  Surgeon: Jonathon Bellows, MD;  Location: [facility] ENDOSCOPY;  Service: Gastroenterology;  Laterality: N/A;   ESOPHAGOGASTRODUODENOSCOPY (EGD) WITH PROPOFOL N/A 04/26/2018   Procedure: ESOPHAGOGASTRODUODENOSCOPY (EGD) WITH PROPOFOL;  Surgeon: Jonathon Bellows, MD;  Location: [facility] ENDOSCOPY;  Service: Gastroenterology;  Laterality: N/A;   JOINT REPLACEMENT     REVERSE SHOULDER ARTHROPLASTY Right 11/20/2017    Procedure: REVERSE SHOULDER ARTHROPLASTY;  Surgeon: Leim Fabry, MD;  Location: [facility];  Service: Orthopedics;  Laterality: Right;   ROTATOR CUFF REPAIR Right    right shoulder   10 YRS   TOTAL HIP ARTHROPLASTY Bilateral 2005, 2006   Bilateral, [place]    Family History  Problem Relation Age of Onset   Arthritis Mother    Heart disease Mother    Arthritis Father    Stroke Brother    Heart disease Brother    Cancer Neg Hx     SOCIAL HX: Former smoker   Current Outpatient Medications:    Acetaminophen (TYLENOL ARTHRITIS PAIN PO), Take by mouth 2 (two) times daily as needed., Disp: , Rfl:    amLODipine-benazepril (LOTREL) 5-20 MG capsule, TAKE 1 CAPSULE BY MOUTH  DAILY, Disp: 90 capsule, Rfl: 3   cholecalciferol (VITAMIN D) 1000 units tablet, Take 1,000 Units by mouth daily., Disp: , Rfl:    docusate sodium (COLACE) 100 MG capsule, Take 100 mg by mouth daily., Disp: , Rfl:    loratadine (CLARITIN) 10 MG tablet, TAKE 1 TABLET BY MOUTH EVERY DAY, Disp: 30 tablet, Rfl: 1   Multiple Vitamin (MULTIVITAMIN WITH MINERALS) TABS tablet, Take 1 tablet by mouth daily. CENTRUM SILVER, Disp: , Rfl:    Omega-3 Fatty Acids (FISH OIL) 1000 MG CAPS, Take 1,000-2,000 mg by mouth daily. TAKE 2 CAPSULES IN THE MORNING & 1 CAPSULE AT NIGHT, Disp: , Rfl:    omeprazole (PRILOSEC) 20 MG capsule, Take 1 capsule (20 mg total) by mouth daily., Disp: 90 capsule, Rfl: 3   simvastatin (ZOCOR) 20 MG tablet, TAKE 1 TABLET BY MOUTH  EVERY MORNING (Patient taking differently: TAKE 1 TABLET BY MOUTH  EVERY EVENING), Disp: 90 tablet, Rfl: 3   Turmeric 450 MG CAPS, Take by mouth. Taken daily., Disp: , Rfl:    ferrous sulfate 325 (65 FE) MG EC tablet, Take 1 tablet (325 mg total) by mouth 2 (two) times daily with a meal., Disp: 180 tablet, Rfl: 1  Current Facility-Administered Medications:    albuterol (PROVENTIL) (2.5 MG/3ML) 0.083% nebulizer solution 2.5 mg, 2.5 mg, Nebulization, Once, Arnett,  Yvetta Coder, FNP  EXAM:  VITALS per patient if applicable: None.  GENERAL: alert, oriented, appears well and in no acute distress  HEENT: atraumatic, conjunttiva clear, no obvious abnormalities on inspection of external nose and ears  NECK: normal movements of the head and neck  LUNGS: on inspection no signs of respiratory distress, breathing rate appears normal, no obvious gross SOB, gasping or wheezing  CV: no obvious cyanosis  MS: moves all visible extremities without noticeable abnormality  PSYCH/NEURO: pleasant and cooperative, no obvious depression or anxiety, speech and thought processing grossly intact  ASSESSMENT AND PLAN:  Discussed the following assessment and plan:  Essential hypertension  Hyperlipidemia, unspecified hyperlipidemia type  Difficulty walking  Hypertension Adequately controlled.  Continue current medication.  We will check lab work next week when she comes to the office.  Hyperlipidemia Continue simvastatin.  Check lab work  next week.  Difficulty walking Undetermined cause.  This could be a balance issue versus an arthritic issue versus some other issue.  We will have the patient come into the office next week for physical exam to help determine further evaluation.  Patient was scheduled for follow-up next Tuesday.  Social distancing precautions and sick precautions given regarding COVID-19.   I discussed the assessment and treatment plan with the patient. The patient was provided an opportunity to ask questions and all were answered. The patient agreed with the plan and demonstrated an understanding of the instructions.   The patient was advised to call back or seek an in-person evaluation if the symptoms worsen or if the condition fails to improve as anticipated.  Tommi Rumps, MD

## 2019-02-09 NOTE — Assessment & Plan Note (Signed)
Undetermined cause.  This could be a balance issue versus an arthritic issue versus some other issue.  We will have the patient come into the office next week for physical exam to help determine further evaluation.

## 2019-02-11 ENCOUNTER — Other Ambulatory Visit: Payer: Self-pay

## 2019-02-15 ENCOUNTER — Other Ambulatory Visit: Payer: Self-pay

## 2019-02-15 ENCOUNTER — Ambulatory Visit (INDEPENDENT_AMBULATORY_CARE_PROVIDER_SITE_OTHER): Payer: Medicare Other

## 2019-02-15 ENCOUNTER — Encounter: Payer: Self-pay | Admitting: Family Medicine

## 2019-02-15 ENCOUNTER — Ambulatory Visit (INDEPENDENT_AMBULATORY_CARE_PROVIDER_SITE_OTHER): Payer: Medicare Other | Admitting: Family Medicine

## 2019-02-15 VITALS — BP 148/60 | HR 79 | Temp 97.9°F | Ht 59.0 in | Wt 151.2 lb

## 2019-02-15 DIAGNOSIS — M4326 Fusion of spine, lumbar region: Secondary | ICD-10-CM | POA: Diagnosis not present

## 2019-02-15 DIAGNOSIS — M545 Low back pain, unspecified: Secondary | ICD-10-CM

## 2019-02-15 DIAGNOSIS — M5136 Other intervertebral disc degeneration, lumbar region: Secondary | ICD-10-CM | POA: Diagnosis not present

## 2019-02-15 DIAGNOSIS — R262 Difficulty in walking, not elsewhere classified: Secondary | ICD-10-CM | POA: Diagnosis not present

## 2019-02-15 DIAGNOSIS — M47816 Spondylosis without myelopathy or radiculopathy, lumbar region: Secondary | ICD-10-CM | POA: Diagnosis not present

## 2019-02-15 DIAGNOSIS — K922 Gastrointestinal hemorrhage, unspecified: Secondary | ICD-10-CM

## 2019-02-15 DIAGNOSIS — E785 Hyperlipidemia, unspecified: Secondary | ICD-10-CM | POA: Diagnosis not present

## 2019-02-15 DIAGNOSIS — I350 Nonrheumatic aortic (valve) stenosis: Secondary | ICD-10-CM

## 2019-02-15 DIAGNOSIS — G8929 Other chronic pain: Secondary | ICD-10-CM | POA: Insufficient documentation

## 2019-02-15 DIAGNOSIS — M4316 Spondylolisthesis, lumbar region: Secondary | ICD-10-CM | POA: Diagnosis not present

## 2019-02-15 NOTE — Assessment & Plan Note (Signed)
Gait does appear to be slow and favoring the left side.  This could be related to leg length discrepancy versus muscular weakness versus related to her back.  I discussed that less likely it could be a neurological issue.  We will get her in to see orthopedics.  Will obtain an x-ray of her back.

## 2019-02-15 NOTE — Assessment & Plan Note (Signed)
Likely related to underlying DDD versus arthritis.  Some measure of muscular spasm may be present as well.  We will obtain an x-ray.  She will continue her current treatment.

## 2019-02-15 NOTE — Assessment & Plan Note (Signed)
She will continue to follow with cardiology.

## 2019-02-15 NOTE — Progress Notes (Signed)
Tommi Rumps, MD Phone: [phone]  Robin Alexander is a 85 y.o. female who presents today for follow-up.  Chronic low back pain: Patient notes she fell 2 years ago and landed on her buttocks and has had pain in her low back since then.  Notes some numbness in her left toes though no numbness elsewhere.  Feels as though both of her legs are weak as it is hard to get up out of a chair.  No radiation of the pain.  No saddle anesthesia.  No incontinence.  She takes Tylenol arthritis and that does help with her back.  Aspercreme helps as well.  She does have a history of lumbar spine fusion.  She had an MRI about 2 years ago though no recent imaging.  No recent injuries.  Difficulty walking: The patient notes she has had issues with her walking for the last several years as well.  She feels as though she is throwing her left hip out to the side.  She feels as though her gait is getting worse.  She did see orthopedics 2 years ago and had physical therapy for about a year per her report though she notes this was not beneficial.  She does note after having a hip surgery her left leg is shorter than the right and she does wear a heel lift.  On review of the orthopedic note it appears they felt as though her symptoms were related to generalized weakness of the left hip abductors with resultant IT band tightness.  They felt as though she had left trochanteric bursitis at that time.  Aortic stenosis: Patient continues to follow with cardiology for this.  She notes she has had a heart murmur for some time.  She has an echo scheduled for July 1.  Social History   Tobacco Use  Smoking Status Former Smoker  . Packs/day: 0.75  . Years: 20.00  . Pack years: 15.00  . Types: Cigarettes  . Quit date: 09/02/1983  . Years since quitting: 35.4  Smokeless Tobacco Never Used     ROS see history of present illness  Objective  Physical Exam Vitals:   02/15/19 1443  BP: (!) 148/60  Pulse: 79  Temp: 97.9  F (36.6 C)  SpO2: 96%    BP Readings from Last 3 Encounters:  02/15/19 (!) 148/60  09/02/18 135/86  08/16/18 132/66   Wt Readings from Last 3 Encounters:  02/15/19 151 lb 3.2 oz (68.6 kg)  09/02/18 145 lb 6.4 oz (66 kg)  08/16/18 145 lb 12.8 oz (66.1 kg)    Physical Exam Constitutional:      General: She is not in acute distress.    Appearance: She is not diaphoretic.  Cardiovascular:     Rate and Rhythm: Normal rate and regular rhythm.     Heart sounds: Murmur (3/6 systolic ejection murmur) present.  Pulmonary:     Effort: Pulmonary effort is normal.     Breath sounds: Normal breath sounds.  Musculoskeletal:     Comments: No midline spine tenderness, no midline spine step-off, there is some tenderness in the right lumbar paraspinous musculature, no overlying skin changes, no other muscular tenderness  Skin:    General: Skin is warm and dry.  Neurological:     Mental Status: She is alert.     Comments: CN 3-12 intact, 5/5 strength in bilateral biceps, triceps, grip, quads, hamstrings, plantar and dorsiflexion, sensation to light touch slightly decreased in the left toes though otherwise intact in  bilateral UE and LE, slow gait aided by a cane use with the right hand, gait seems to favor the left leg, 2+ patellar and Achilles reflexes      Assessment/Plan: Please see individual problem list.  Moderate aortic stenosis She will continue to follow with cardiology.  Chronic low back pain Likely related to underlying DDD versus arthritis.  Some measure of muscular spasm may be present as well.  We will obtain an x-ray.  She will continue her current treatment.  Difficulty walking Gait does appear to be slow and favoring the left side.  This could be related to leg length discrepancy versus muscular weakness versus related to her back.  I discussed that less likely it could be a neurological issue.  We will get her in to see orthopedics.  Will obtain an x-ray of her back.      Orders Placed This Encounter  Procedures  . DG Lumbar Spine Complete    Standing Status:   Future    Number of Occurrences:   1    Standing Expiration Date:   04/16/2020    Order Specific Question:   Reason for Exam (SYMPTOM  OR DIAGNOSIS REQUIRED)    Answer:   low back pain, gait instability    Order Specific Question:   Preferred imaging location?    Answer:   [facility] Specific Question:   Radiology Contrast Protocol - do NOT remove file path    Answer:   [email or web address]  . CBC  . Ambulatory referral to Orthopedic Surgery    Referral Priority:   Routine    Referral Type:   Surgical    Referral Reason:   Specialty Services Required    Requested Specialty:   Orthopedic Surgery    Number of Visits Requested:   1    No orders of the defined types were placed in this encounter.    Tommi Rumps, MD [place]

## 2019-02-15 NOTE — Patient Instructions (Signed)
Nice to see. We will get an x-ray today.  We will also do your lab work. When your x-ray returns we will contact you to let you know what the next step is.

## 2019-02-16 LAB — CBC
HCT: 37.9 % (ref 35.0–45.0)
Hemoglobin: 13 g/dL (ref 11.7–15.5)
MCH: 33.1 pg — ABNORMAL HIGH (ref 27.0–33.0)
MCHC: 34.3 g/dL (ref 32.0–36.0)
MCV: 96.4 fL (ref 80.0–100.0)
MPV: 12.3 fL (ref 7.5–12.5)
Platelets: 194 10*3/uL (ref 140–400)
RBC: 3.93 10*6/uL (ref 3.80–5.10)
RDW: 12.4 % (ref 11.0–15.0)
WBC: 8.3 10*3/uL (ref 3.8–10.8)

## 2019-02-16 LAB — LIPID PANEL
Cholesterol: 141 mg/dL (ref 0–200)
HDL: 50.4 mg/dL (ref >39.00)
LDL Cholesterol: 64 mg/dL (ref 0–99)
NonHDL: 90.2
Total CHOL/HDL Ratio: 3
Triglycerides: 130 mg/dL (ref 0.0–149.0)
VLDL: 26 mg/dL (ref 0.0–40.0)

## 2019-02-16 LAB — COMPREHENSIVE METABOLIC PANEL
ALT: 19 U/L (ref 0–35)
AST: 21 U/L (ref 0–37)
Albumin: 5.1 g/dL (ref 3.5–5.2)
Alkaline Phosphatase: 69 U/L (ref 39–117)
BUN: 28 mg/dL — ABNORMAL HIGH (ref 6–23)
CO2: 29 mEq/L (ref 19–32)
Calcium: 10.4 mg/dL (ref 8.4–10.5)
Chloride: 102 mEq/L (ref 96–112)
Creatinine, Ser: 1.08 mg/dL (ref 0.40–1.20)
GFR: 48.31 mL/min — ABNORMAL LOW (ref >60.00)
Glucose, Bld: 104 mg/dL — ABNORMAL HIGH (ref 70–99)
Potassium: 4.6 mEq/L (ref 3.5–5.1)
Sodium: 139 mEq/L (ref 135–145)
Total Bilirubin: 0.5 mg/dL (ref 0.2–1.2)
Total Protein: 7.9 g/dL (ref 6.0–8.3)

## 2019-02-17 ENCOUNTER — Other Ambulatory Visit: Payer: Self-pay | Admitting: Family Medicine

## 2019-02-17 DIAGNOSIS — R7989 Other specified abnormal findings of blood chemistry: Secondary | ICD-10-CM

## 2019-02-25 ENCOUNTER — Other Ambulatory Visit: Payer: Self-pay | Admitting: Family Medicine

## 2019-03-01 DIAGNOSIS — M5416 Radiculopathy, lumbar region: Secondary | ICD-10-CM | POA: Diagnosis not present

## 2019-03-01 DIAGNOSIS — M25552 Pain in left hip: Secondary | ICD-10-CM | POA: Diagnosis not present

## 2019-03-07 ENCOUNTER — Telehealth: Payer: Self-pay

## 2019-03-07 DIAGNOSIS — Z961 Presence of intraocular lens: Secondary | ICD-10-CM | POA: Diagnosis not present

## 2019-03-07 NOTE — Telephone Encounter (Signed)

## 2019-03-08 ENCOUNTER — Ambulatory Visit (INDEPENDENT_AMBULATORY_CARE_PROVIDER_SITE_OTHER): Payer: Medicare Other

## 2019-03-08 ENCOUNTER — Other Ambulatory Visit: Payer: Self-pay

## 2019-03-08 DIAGNOSIS — I350 Nonrheumatic aortic (valve) stenosis: Secondary | ICD-10-CM | POA: Diagnosis not present

## 2019-03-10 ENCOUNTER — Other Ambulatory Visit: Payer: Self-pay

## 2019-03-10 ENCOUNTER — Inpatient Hospital Stay: Payer: Medicare Other

## 2019-03-10 ENCOUNTER — Inpatient Hospital Stay: Payer: Medicare Other | Attending: Oncology | Admitting: Oncology

## 2019-03-10 ENCOUNTER — Encounter: Payer: Self-pay | Admitting: Oncology

## 2019-03-10 VITALS — BP 142/66 | HR 73 | Temp 97.9°F | Resp 18 | Ht 63.0 in | Wt 152.8 lb

## 2019-03-10 DIAGNOSIS — D891 Cryoglobulinemia: Secondary | ICD-10-CM | POA: Diagnosis not present

## 2019-03-10 DIAGNOSIS — Z8719 Personal history of other diseases of the digestive system: Secondary | ICD-10-CM

## 2019-03-10 DIAGNOSIS — D591 Other autoimmune hemolytic anemias: Secondary | ICD-10-CM

## 2019-03-10 DIAGNOSIS — I10 Essential (primary) hypertension: Secondary | ICD-10-CM

## 2019-03-10 DIAGNOSIS — Z87891 Personal history of nicotine dependence: Secondary | ICD-10-CM | POA: Diagnosis not present

## 2019-03-10 DIAGNOSIS — I350 Nonrheumatic aortic (valve) stenosis: Secondary | ICD-10-CM

## 2019-03-10 DIAGNOSIS — R5383 Other fatigue: Secondary | ICD-10-CM | POA: Diagnosis not present

## 2019-03-10 DIAGNOSIS — R011 Cardiac murmur, unspecified: Secondary | ICD-10-CM | POA: Diagnosis not present

## 2019-03-10 DIAGNOSIS — M1990 Unspecified osteoarthritis, unspecified site: Secondary | ICD-10-CM

## 2019-03-10 DIAGNOSIS — Z79899 Other long term (current) drug therapy: Secondary | ICD-10-CM

## 2019-03-10 DIAGNOSIS — K219 Gastro-esophageal reflux disease without esophagitis: Secondary | ICD-10-CM

## 2019-03-10 DIAGNOSIS — E7800 Pure hypercholesterolemia, unspecified: Secondary | ICD-10-CM | POA: Diagnosis not present

## 2019-03-10 DIAGNOSIS — R5381 Other malaise: Secondary | ICD-10-CM | POA: Diagnosis not present

## 2019-03-10 DIAGNOSIS — D5912 Cold autoimmune hemolytic anemia: Secondary | ICD-10-CM

## 2019-03-10 LAB — RETICULOCYTES
Immature Retic Fract: 7.1 % (ref 2.3–15.9)
RBC.: 3.82 MIL/uL — ABNORMAL LOW (ref 3.87–5.11)
Retic Count, Absolute: 78.3 10*3/uL (ref 19.0–186.0)
Retic Ct Pct: 2.1 % (ref 0.4–3.1)

## 2019-03-10 LAB — CBC WITH DIFFERENTIAL/PLATELET
Abs Immature Granulocytes: 0.07 10*3/uL (ref 0.00–0.07)
Basophils Absolute: 0 10*3/uL (ref 0.0–0.1)
Basophils Relative: 0 %
Eosinophils Absolute: 0 10*3/uL (ref 0.0–0.5)
Eosinophils Relative: 0 %
HCT: 36.5 % (ref 36.0–46.0)
Hemoglobin: 12.5 g/dL (ref 12.0–15.0)
Immature Granulocytes: 1 %
Lymphocytes Relative: 9 %
Lymphs Abs: 1 10*3/uL (ref 0.7–4.0)
MCH: 32.7 pg (ref 26.0–34.0)
MCHC: 34.2 g/dL (ref 30.0–36.0)
MCV: 95.5 fL (ref 80.0–100.0)
Monocytes Absolute: 0.3 10*3/uL (ref 0.1–1.0)
Monocytes Relative: 3 %
Neutro Abs: 9.5 10*3/uL — ABNORMAL HIGH (ref 1.7–7.7)
Neutrophils Relative %: 87 %
Platelets: 196 10*3/uL (ref 150–400)
RBC: 3.82 MIL/uL — ABNORMAL LOW (ref 3.87–5.11)
RDW: 12.5 % (ref 11.5–15.5)
WBC: 11 10*3/uL — ABNORMAL HIGH (ref 4.0–10.5)
nRBC: 0 % (ref 0.0–0.2)

## 2019-03-10 LAB — SEDIMENTATION RATE: Sed Rate: 22 mm/hr (ref 0–30)

## 2019-03-10 NOTE — Progress Notes (Signed)
New patient referred by Dr Caryl Bis.

## 2019-03-10 NOTE — Progress Notes (Signed)
Hematology/Oncology Consult note [facility] Telephone:([phone] [phone]  Patient Care Team: Leone Haven, MD as PCP - General (Family Medicine)   Name of the patient: Robin Alexander  [id number]  1933/06/03    Reason for referral-cryoglobulin/cold agglutinin seen on CBC   Referring physician-Dr. Caryl Bis  Date of visit: 03/10/19   History of presenting illness-patient is a 85 year old female with a past medical history significant for aortic stenosis, hypertension, GERD among other medical problems.  She is doing relatively well for her age and is independent of her ADLs.  She has been referred to [place] for cryoglobulin/cold agglutinins that were incidentally seen on her CBC.Most recent CBC from 02/15/2019 showed white count of 8.3, H&H of 13/37.9 with an MCV of 97.4 and a platelet count of 194.  Cold agglutinin/cryoglobulin was suspected.  She has therefore been referred to me.  Patient has chronic osteoarthritis but denies any new skin rash or joint swelling.  Her appetite and weight are good and she denies any unintentional weight loss or drenching night sweats.  She denies any recent infections or hospitalizations.  ECOG PS- 1  Pain scale- 3   Review of systems- Review of Systems  Constitutional: Positive for malaise/fatigue. Negative for chills, fever and weight loss.  HENT: Negative for congestion, ear discharge and nosebleeds.   Eyes: Negative for blurred vision.  Respiratory: Negative for cough, hemoptysis, sputum production, shortness of breath and wheezing.   Cardiovascular: Negative for chest pain, palpitations, orthopnea and claudication.  Gastrointestinal: Negative for abdominal pain, blood in stool, constipation, diarrhea, heartburn, melena, nausea and vomiting.  Genitourinary: Negative for dysuria, flank pain, frequency, hematuria and urgency.  Musculoskeletal: Negative for back pain, joint pain and myalgias.       Hip pain.   Skin: Negative for rash.  Neurological: Negative for dizziness, tingling, focal weakness, seizures, weakness and headaches.  Endo/Heme/Allergies: Does not bruise/bleed easily.  Psychiatric/Behavioral: Negative for depression and suicidal ideas. The patient does not have insomnia.     Allergies  Allergen Reactions  . Adhesive [Tape]     Paper tape okay  . Codeine Itching    Patient Active Problem List   Diagnosis Date Noted  . Chronic low back pain 02/15/2019  . Difficulty walking 02/09/2019  . Acute pain of right shoulder 08/10/2018  . GI bleed 03/16/2018  . Foot drop 11/23/2017  . Status post shoulder replacement 11/20/2017  . Postmenopausal estrogen deficiency 10/15/2017  . Localized osteoarthritis of right shoulder 10/15/2017  . Difficulty sleeping 10/15/2017  . Bilateral knee pain 06/23/2014  . Lumbar radiculopathy, chronic 06/23/2014  . Moderate aortic stenosis 11/03/2013  . Right bundle branch block 05/04/2013  . Hyperlipidemia 06/29/2012  . GERD (gastroesophageal reflux disease) 07/31/2011  . Hypertension 07/31/2011     Past Medical History:  Diagnosis Date  . Abnormal CT scan, chest 03/20/2014  . Anemia   . Arthritis    shoulders  . Chicken pox   . Complication of anesthesia 2016   slow to wake up after anesthesia  . Dyspnea    uses inhaler when she gets bronchitis  . GERD (gastroesophageal reflux disease)   . Heart murmur   . High cholesterol   . HTN (hypertension)   . Multiple duodenal ulcers   . Pneumonia    spring 2015     Past Surgical History:  Procedure Laterality Date  . ABDOMINAL HYSTERECTOMY     in 50's  . ANTERIOR LAT LUMBAR FUSION Right 02/28/2015   Procedure:  ANTERIOR LATERAL LUMBAR FUSION 1 LEVEL;  Surgeon: Phylliss Bob, MD;  Location: [place];  Service: Orthopedics;  Laterality: Right;  Right sided lumbar 3-4 lateral interbody fusion  . APPENDECTOMY  1950  . BACK SURGERY     rod in back  . BICEPT TENODESIS Right 11/20/2017    Procedure: BICEPS TENODESIS;  Surgeon: Leim Fabry, MD;  Location: [facility];  Service: Orthopedics;  Laterality: Right;  . CATARACT EXTRACTION W/ INTRAOCULAR LENS  IMPLANT, BILATERAL Bilateral 2011  . CESAREAN SECTION     2  . COLONOSCOPY WITH PROPOFOL N/A 03/03/2018   Procedure: COLONOSCOPY WITH PROPOFOL;  Surgeon: Jonathon Bellows, MD;  Location: [facility];  Service: Gastroenterology;  Laterality: N/A;  . ESOPHAGOGASTRODUODENOSCOPY (EGD) WITH PROPOFOL N/A 03/03/2018   Procedure: ESOPHAGOGASTRODUODENOSCOPY (EGD) WITH PROPOFOL;  Surgeon: Jonathon Bellows, MD;  Location: [facility];  Service: Gastroenterology;  Laterality: N/A;  . ESOPHAGOGASTRODUODENOSCOPY (EGD) WITH PROPOFOL N/A 04/26/2018   Procedure: ESOPHAGOGASTRODUODENOSCOPY (EGD) WITH PROPOFOL;  Surgeon: Jonathon Bellows, MD;  Location: [facility] ENDOSCOPY;  Service: Gastroenterology;  Laterality: N/A;  . JOINT REPLACEMENT    . REVERSE SHOULDER ARTHROPLASTY Right 11/20/2017   Procedure: REVERSE SHOULDER ARTHROPLASTY;  Surgeon: Leim Fabry, MD;  Location: [facility];  Service: Orthopedics;  Laterality: Right;  . ROTATOR CUFF REPAIR Right    right shoulder   10 YRS  . TOTAL HIP ARTHROPLASTY Bilateral 2005, 2006   Bilateral, [place]    Social History   Socioeconomic History  . Marital status: Married    Spouse name: Not on file  . Number of children: 2  . Years of education: Not on file  . Highest education level: Not on file  Occupational History  . Occupation: Retired - Product/process development scientist: RETIRED  Social Needs  . Financial resource strain: Not hard at all  . Food insecurity    Worry: Never true    Inability: Never true  . Transportation needs    Medical: No    Non-medical: No  Tobacco Use  . Smoking status: Former Smoker    Packs/day: 0.75    Years: 20.00    Pack years: 15.00    Types: Cigarettes    Quit date: 09/02/1983    Years since quitting: 35.5  . Smokeless tobacco: Never Used  Substance and Sexual Activity  . Alcohol  use: Yes    Alcohol/week: 0.0 standard drinks    Comment: Occasional wine,none last 24hrs  . Drug use: No  . Sexual activity: Yes    Birth control/protection: Post-menopausal, Surgical  Lifestyle  . Physical activity    Days per week: Not on file    Minutes per session: Not on file  . Stress: Not at all  Relationships  . Social Herbalist on phone: Patient refused    Gets together: Patient refused    Attends religious service: Patient refused    Active member of club or organization: Patient refused    Attends meetings of clubs or organizations: Patient refused    Relationship status: Patient refused  . Intimate partner violence    Fear of current or ex partner: No    Emotionally abused: No    Physically abused: No    Forced sexual activity: No  Other Topics Concern  . Not on file  Social History Narrative   Regular Exercise -  Active but no regular exercise routine   Daily Caffeine Use:  NO           Family History  Problem Relation Age of Onset  . Arthritis Mother   . Heart disease Mother   . Arthritis Father   . Stroke Brother   . Heart disease Brother   . Cancer Neg Hx      Current Outpatient Medications:  .  Acetaminophen (TYLENOL ARTHRITIS PAIN PO), Take by mouth 2 (two) times daily as needed., Disp: , Rfl:  .  amLODipine-benazepril (LOTREL) 5-20 MG capsule, TAKE 1 CAPSULE BY MOUTH  DAILY, Disp: 90 capsule, Rfl: 3 .  cholecalciferol (VITAMIN D) 1000 units tablet, Take 1,000 Units by mouth daily., Disp: , Rfl:  .  docusate sodium (COLACE) 100 MG capsule, Take 100 mg by mouth daily., Disp: , Rfl:  .  Multiple Vitamin (MULTIVITAMIN WITH MINERALS) TABS tablet, Take 1 tablet by mouth daily. CENTRUM SILVER, Disp: , Rfl:  .  Omega-3 Fatty Acids (FISH OIL) 1000 MG CAPS, Take 1,000-2,000 mg by mouth daily. TAKE 2 CAPSULES IN THE MORNING & 1 CAPSULE AT NIGHT, Disp: , Rfl:  .  omeprazole (PRILOSEC) 20 MG capsule, Take 1 capsule (20 mg total) by mouth daily.,  Disp: 90 capsule, Rfl: 3 .  simvastatin (ZOCOR) 20 MG tablet, Take 1 tablet (20 mg total) by mouth every evening., Disp: 90 tablet, Rfl: 1 .  Turmeric 450 MG CAPS, Take by mouth. Taken daily., Disp: , Rfl:  .  ferrous sulfate 325 (65 FE) MG EC tablet, Take 1 tablet (325 mg total) by mouth 2 (two) times daily with a meal. (Patient not taking: Reported on 03/10/2019), Disp: 180 tablet, Rfl: 1 .  loratadine (CLARITIN) 10 MG tablet, TAKE 1 TABLET BY MOUTH EVERY DAY (Patient not taking: Reported on 03/10/2019), Disp: 30 tablet, Rfl: 1  Current Facility-Administered Medications:  .  albuterol (PROVENTIL) (2.5 MG/3ML) 0.083% nebulizer solution 2.5 mg, 2.5 mg, Nebulization, Once, Burnard Hawthorne, FNP   Physical exam:  Vitals:   03/10/19 1350  BP: (!) 142/66  Pulse: 73  Resp: 18  Temp: 97.9 F (36.6 C)  TempSrc: Tympanic  SpO2: 97%  Weight: 152 lb 12.8 oz (69.3 kg)  Height: '5\' 3"'  (1.6 m)   Physical Exam HENT:     Head: Normocephalic and atraumatic.  Eyes:     Pupils: Pupils are equal, round, and reactive to light.  Neck:     Musculoskeletal: Normal range of motion.  Cardiovascular:     Rate and Rhythm: Normal rate and regular rhythm.     Heart sounds: Murmur (Systolic) present.  Pulmonary:     Effort: Pulmonary effort is normal.     Breath sounds: Normal breath sounds.  Abdominal:     General: Bowel sounds are normal.     Palpations: Abdomen is soft.  Skin:    General: Skin is warm and dry.  Neurological:     Mental Status: She is alert and oriented to person, place, and time.        CMP Latest Ref Rng & Units 02/15/2019  Glucose 70 - 99 mg/dL 104(H)  BUN 6 - 23 mg/dL 28(H)  Creatinine 0.40 - 1.20 mg/dL 1.08  Sodium 135 - 145 mEq/L 139  Potassium 3.5 - 5.1 mEq/L 4.6  Chloride 96 - 112 mEq/L 102  CO2 19 - 32 mEq/L 29  Calcium 8.4 - 10.5 mg/dL 10.4  Total Protein 6.0 - 8.3 g/dL 7.9  Total Bilirubin 0.2 - 1.2 mg/dL 0.5  Alkaline Phos 39 - 117 U/L 69  AST 0 - 37 U/L 21   ALT 0 - 35  U/L 19   CBC Latest Ref Rng & Units 02/15/2019  WBC 3.8 - 10.8 Thousand/uL 8.3  Hemoglobin 11.7 - 15.5 g/dL 13.0  Hematocrit 35.0 - 45.0 % 37.9  Platelets 140 - 400 Thousand/uL 194    No images are attached to the encounter.  Dg Lumbar Spine Complete  Result Date: 02/16/2019 CLINICAL DATA:  Chronic right-sided low back pain. EXAM: LUMBAR SPINE - COMPLETE 4+ VIEW COMPARISON:  MRI lumbar spine dated November 23, 2017. FINDINGS: Five lumbar type vertebral bodies. Prior L3-L4 right lateral vertebral body and posterior pedicle fusion. No acute fracture or subluxation. Unchanged chronic mild L1 superior endplate compression deformity. Remaining vertebral body heights are preserved. Unchanged grade 1 anterolisthesis at L4-L5. Unchanged severe disc height loss at L2-L3 and L4-L5. Unchanged L5-S1 ankylosis. Degenerative changes of the bilateral sacroiliac joints and pubic symphysis. Prior bilateral total hip arthroplasties. IMPRESSION: 1. Prior L3-L4 fusion with similar appearing severe adjacent segment degenerative disc disease at L2-L3 and L4-L5. Electronically Signed   By: Titus Dubin M.D.   On: 02/16/2019 10:28    Assessment and plan- Patient is a 85 y.o. female referred for incidentally found cryoglobulin/cold agglutinin in her CBC  Cryoglobulins are immunoglobulins which can precipitate in organs and can cause vasculitis kind of picture.  Patient does not have any active acute joint pain or inflammation.  I will check cryoglobulin level today.  There are 3 types of cryoglobulin like vasculitis and type I can be associated with condition such as myeloma.  I will therefore be checking a myeloma panel as well as serum free light chains as well as HIV hepatitis B and hepatitis C testing.  Also check ANA comprehensive panel and ESR.  Cold agglutinins are immunoglobulins that attack RBCs and can cause cold agglutinin hemolytic anemia.  Patient is not presently anemic.  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 Cryoglobulins present ([place])     Dr. Randa Evens, MD, MPH [facility] [phone] 03/10/2019 1:18 PM

## 2019-03-11 ENCOUNTER — Encounter: Payer: Self-pay | Admitting: Oncology

## 2019-03-11 LAB — ANA COMPREHENSIVE PANEL
Anti JO-1: <0.2 AI (ref 0.0–0.9)
Centromere Ab Screen: <0.2 AI (ref 0.0–0.9)
Chromatin Ab SerPl-aCnc: <0.2 AI (ref 0.0–0.9)
ENA SM Ab Ser-aCnc: <0.2 AI (ref 0.0–0.9)
Ribonucleic Protein: <0.2 AI (ref 0.0–0.9)
SSA (Ro) (ENA) Antibody, IgG: <0.2 AI (ref 0.0–0.9)
SSB (La) (ENA) Antibody, IgG: <0.2 AI (ref 0.0–0.9)
Scleroderma (Scl-70) (ENA) Antibody, IgG: 0.5 AI (ref 0.0–0.9)
ds DNA Ab: <1 IU/mL (ref 0–9)

## 2019-03-11 LAB — HEPATITIS B SURFACE ANTIGEN: Hepatitis B Surface Ag: NEGATIVE

## 2019-03-11 LAB — HAPTOGLOBIN: Haptoglobin: 209 mg/dL (ref 41–333)

## 2019-03-11 LAB — COLD AGGLUTININ TITER: Cold Agglutinin Titer: 1:8 {titer}

## 2019-03-11 LAB — KAPPA/LAMBDA LIGHT CHAINS
Kappa free light chain: 18.4 mg/L (ref 3.3–19.4)
Kappa, lambda light chain ratio: 1.32 (ref 0.26–1.65)
Lambda free light chains: 13.9 mg/L (ref 5.7–26.3)

## 2019-03-11 LAB — HEPATITIS B CORE ANTIBODY, TOTAL: Hep B Core Total Ab: NEGATIVE

## 2019-03-11 LAB — HIV ANTIBODY (ROUTINE TESTING W REFLEX): HIV Screen 4th Generation wRfx: NONREACTIVE

## 2019-03-11 LAB — HEPATITIS C ANTIBODY: HCV Ab: <0.1 s/co ratio (ref 0.0–0.9)
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
## 2019-03-15 ENCOUNTER — Encounter: Payer: Self-pay | Admitting: Physician Assistant

## 2019-03-15 ENCOUNTER — Ambulatory Visit: Payer: Medicare Other | Admitting: Physician Assistant

## 2019-03-15 ENCOUNTER — Other Ambulatory Visit: Payer: Self-pay

## 2019-03-15 VITALS — BP 140/56 | HR 80 | Temp 97.3°F | Ht 60.0 in | Wt 147.2 lb

## 2019-03-15 DIAGNOSIS — I350 Nonrheumatic aortic (valve) stenosis: Secondary | ICD-10-CM | POA: Diagnosis not present

## 2019-03-15 DIAGNOSIS — M545 Low back pain: Secondary | ICD-10-CM

## 2019-03-15 DIAGNOSIS — I10 Essential (primary) hypertension: Secondary | ICD-10-CM

## 2019-03-15 DIAGNOSIS — G8929 Other chronic pain: Secondary | ICD-10-CM

## 2019-03-15 DIAGNOSIS — E785 Hyperlipidemia, unspecified: Secondary | ICD-10-CM

## 2019-03-15 DIAGNOSIS — I059 Rheumatic mitral valve disease, unspecified: Secondary | ICD-10-CM

## 2019-03-15 DIAGNOSIS — Z01810 Encounter for preprocedural cardiovascular examination: Secondary | ICD-10-CM

## 2019-03-15 LAB — MULTIPLE MYELOMA PANEL, SERUM
Albumin SerPl Elph-Mcnc: 4.6 g/dL — ABNORMAL HIGH (ref 2.9–4.4)
Albumin/Glob SerPl: 1.8 — ABNORMAL HIGH (ref 0.7–1.7)
Alpha 1: 0.2 g/dL (ref 0.0–0.4)
Alpha2 Glob SerPl Elph-Mcnc: 0.8 g/dL (ref 0.4–1.0)
B-Globulin SerPl Elph-Mcnc: 0.8 g/dL (ref 0.7–1.3)
Gamma Glob SerPl Elph-Mcnc: 0.9 g/dL (ref 0.4–1.8)
Globulin, Total: 2.7 g/dL (ref 2.2–3.9)
IgA: 176 mg/dL (ref 64–422)
IgG (Immunoglobin G), Serum: 896 mg/dL (ref 586–1602)
IgM (Immunoglobulin M), Srm: 277 mg/dL — ABNORMAL HIGH (ref 26–217)
Total Protein ELP: 7.3 g/dL (ref 6.0–8.5)

## 2019-03-15 LAB — CRYOGLOBULIN

## 2019-03-15 NOTE — Patient Instructions (Signed)
Medication Instructions:  Your physician recommends that you continue on your current medications as directed. Please refer to the Current Medication list given to you today.  If you need a refill on your cardiac medications before your next appointment, please call your pharmacy.   Lab work: None  If you have labs (blood work) drawn today and your tests are completely normal, you will receive your results only by: Marland Kitchen MyChart Message (if you have MyChart) OR . A paper copy in the mail If you have any lab test that is abnormal or we need to change your treatment, we will call you to review the results.  Testing/Procedures: Your physician has requested that you have an echocardiogram in July 2021. Echocardiography is a painless test that uses sound waves to create images of your heart. It provides your doctor with information about the size and shape of your heart and how well your heart's chambers and valves are working. This procedure takes approximately one hour. There are no restrictions for this procedure.    Follow-Up: At [facility], you and your health needs are our priority.  As part of our continuing mission to provide you with exceptional heart care, we have created designated Provider Care Teams.  These Care Teams include your primary Cardiologist (physician) and Advanced Practice Providers (APPs -  Physician Assistants and Nurse Practitioners) who all work together to provide you with the care you need, when you need it. You will need a follow up appointment in 12 months.  Please call our office 2 months in advance to schedule this appointment.  You may see Dr. Fletcher Anon or one of the following Advanced Practice Providers on your designated Care Team:   Murray Hodgkins, NP Christell Faith, PA-C . Marrianne Mood, PA-C  Any Other Special Instructions Will Be Listed Below (If Applicable).

## 2019-03-17 ENCOUNTER — Other Ambulatory Visit: Payer: Self-pay | Admitting: Student

## 2019-03-17 DIAGNOSIS — M48062 Spinal stenosis, lumbar region with neurogenic claudication: Secondary | ICD-10-CM | POA: Diagnosis not present

## 2019-03-17 DIAGNOSIS — M5416 Radiculopathy, lumbar region: Secondary | ICD-10-CM | POA: Diagnosis not present

## 2019-03-17 DIAGNOSIS — M4807 Spinal stenosis, lumbosacral region: Secondary | ICD-10-CM | POA: Diagnosis not present

## 2019-03-21 ENCOUNTER — Inpatient Hospital Stay (HOSPITAL_BASED_OUTPATIENT_CLINIC_OR_DEPARTMENT_OTHER): Payer: Medicare Other | Admitting: Oncology

## 2019-03-21 ENCOUNTER — Encounter: Payer: Self-pay | Admitting: Oncology

## 2019-03-21 DIAGNOSIS — D591 Other autoimmune hemolytic anemias: Secondary | ICD-10-CM

## 2019-03-21 DIAGNOSIS — D5912 Cold autoimmune hemolytic anemia: Secondary | ICD-10-CM

## 2019-03-21 NOTE — Progress Notes (Signed)
md to discuss results of labs done at last visit.

## 2019-03-22 ENCOUNTER — Encounter: Payer: Self-pay | Admitting: Oncology

## 2019-03-22 NOTE — Progress Notes (Signed)
I connected with Robin Alexander on 03/22/19 at 10:30 AM EDT by video enabled telemedicine visit and verified that I am speaking with the correct person using two identifiers.   I discussed the limitations, risks, security and privacy concerns of performing an evaluation and management service by telemedicine and the availability of in-person appointments. I also discussed with the patient that there may be a patient responsible charge related to this service. The patient expressed understanding and agreed to proceed.  Other persons participating in the visit and their role in the encounter:  None  There were problems during the video connection and the visit have to be switched to a telephone call  Patient's location:  home Provider's location:  work  Risk analyst Complaint:  Discuss results of bloodwork  Diagnosis: incidentally detected cold agglutinin  History of present illness: patient is a 85 year old female with a past medical history significant for aortic stenosis, hypertension, GERD among other medical problems.  She is doing relatively well for her age and is independent of her ADLs.  She has been referred to [place] for cryoglobulin/cold agglutinins that were incidentally seen on her CBC.Most recent CBC from 02/15/2019 showed white count of 8.3, H&H of 13/37.9 with an MCV of 97.4 and a platelet count of 194.  Cold agglutinin/cryoglobulin was suspected.  She has therefore been referred to me.  Patient has chronic osteoarthritis but denies any new skin rash or joint swelling.  Her appetite and weight are good and she denies any unintentional weight loss or drenching night sweats.  She denies any recent infections or hospitalizations.  Results of blood work from 03/10/2019 were as follows: CBC showed white count of 11 with mild neutrophilia, H&H of 12.5/36.5 and a platelet count of 196.  Reticulocyte count, haptoglobin, ESR, ANA comprehensive panel, myeloma panel, light chains were unremarkable.  HIV hep B  and hep C testing was negative.  Cryoglobulins were not detected.  Cold agglutinin titer was mildly positive at 1:8  Interval history overall she feels well and denies any complaints at this time.   Review of Systems  Constitutional: Negative for chills, fever, malaise/fatigue and weight loss.  HENT: Negative for congestion, ear discharge and nosebleeds.   Eyes: Negative for blurred vision.  Respiratory: Negative for cough, hemoptysis, sputum production, shortness of breath and wheezing.   Cardiovascular: Negative for chest pain, palpitations, orthopnea and claudication.  Gastrointestinal: Negative for abdominal pain, blood in stool, constipation, diarrhea, heartburn, melena, nausea and vomiting.  Genitourinary: Negative for dysuria, flank pain, frequency, hematuria and urgency.  Musculoskeletal: Negative for back pain, joint pain and myalgias.  Skin: Negative for rash.  Neurological: Negative for dizziness, tingling, focal weakness, seizures, weakness and headaches.  Endo/Heme/Allergies: Does not bruise/bleed easily.  Psychiatric/Behavioral: Negative for depression and suicidal ideas. The patient does not have insomnia.     Allergies  Allergen Reactions  . Adhesive [Tape]     Paper tape okay  . Codeine Itching    Past Medical History:  Diagnosis Date  . Abnormal CT scan, chest 03/20/2014  . Anemia   . Arthritis    shoulders  . Chicken pox   . 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 Complication of anesthesia 2016   slow to wake up after anesthesia  . Dyspnea    uses inhaler when she gets bronchitis  . GERD (gastroesophageal reflux disease)   . Heart murmur   . High cholesterol   . HTN (hypertension)   . Multiple duodenal ulcers   . Pneumonia  spring 2015    Past Surgical History:  Procedure Laterality Date  . ABDOMINAL HYSTERECTOMY     in 50's  . ANTERIOR LAT LUMBAR FUSION Right 02/28/2015   Procedure: ANTERIOR LATERAL LUMBAR FUSION 1 LEVEL;  Surgeon: Phylliss Bob,  MD;  Location: [place];  Service: Orthopedics;  Laterality: Right;  Right sided lumbar 3-4 lateral interbody fusion  . APPENDECTOMY  1950  . BACK SURGERY     rod in back  . BICEPT TENODESIS Right 11/20/2017   Procedure: BICEPS TENODESIS;  Surgeon: Leim Fabry, MD;  Location: [facility];  Service: Orthopedics;  Laterality: Right;  . CATARACT EXTRACTION W/ INTRAOCULAR LENS  IMPLANT, BILATERAL Bilateral 2011  . CESAREAN SECTION     2  . COLONOSCOPY WITH PROPOFOL N/A 03/03/2018   Procedure: COLONOSCOPY WITH PROPOFOL;  Surgeon: Jonathon Bellows, MD;  Location[facility] ENDOS[facility];  Service: Gastroenterology;  Laterality: N/A;  . ESOPHAGOGASTRODUODENOSCOPY (EGD) WITH PROPOFOL N/A 03/03/2018   Procedure: ESOPHAGOGASTRODUODENOSCOPY (EGD) WITH PROPOFOL;  Surgeon: Jonathon Bellows, MD;  Location: [facility] ENDOSCOPY;  Service: Gastroenterology;  Laterality: N/A;  . ESOPHAGOGASTRODUODENOSCOPY (EGD) WITH PROPOFOL N/A 04/26/2018   Procedure: ESOPHAGOGASTRODUODENOSCOPY (EGD) WITH PROPOFOL;  Surgeon: Jonathon Bellows, MD;  Location: [facility] ENDOSCOPY;  Service: Gastroenterology;  Laterality: N/A;  . JOINT REPLACEMENT    . REVERSE SHOULDER ARTHROPLASTY Right 11/20/2017   Procedure: REVERSE SHOULDER ARTHROPLASTY;  Surgeon: Leim Fabry, MD;  Location: [facility];  Service: Orthopedics;  Laterality: Right;  . ROTATOR CUFF REPAIR Right    right shoulder   10 YRS  . TOTAL HIP ARTHROPLASTY Bilateral 2005, 2006   Bilateral, [place]    Social History   Socioeconomic History  . Marital status: Married    Spouse name: Not on file  . Number of children: 2  . Years of education: Not on file  . Highest education level: Not on file  Occupational History  . Occupation: Retired - Product/process development scientist: RETIRED  Social Needs  . Financial resource strain: Not hard at all  . Food insecurity    Worry: Never true    Inability: Never true  . Transportation needs    Medical: No    Non-medical: No  Tobacco Use  . Smoking status: Former Smoker     Packs/day: 0.75    Years: 20.00    Pack years: 15.00    Types: Cigarettes    Quit date: 09/02/1983    Years since quitting: 35.5  . Smokeless tobacco: Never Used  Substance and Sexual Activity  . Alcohol use: Yes    Alcohol/week: 0.0 standard drinks    Comment: Occasional wine,none last 24hrs  . Drug use: No  . Sexual activity: Yes    Birth control/protection: Post-menopausal, Surgical  Lifestyle  . Physical activity    Days per week: Not on file    Minutes per session: Not on file  . Stress: Not at all  Relationships  . Social Herbalist on phone: Patient refused    Gets together: Patient refused    Attends religious service: Patient refused    Active member of club or organization: Patient refused    Attends meetings of clubs or organizations: Patient refused    Relationship status: Patient refused  . Intimate partner violence    Fear of current or ex partner: No    Emotionally abused: No    Physically abused: No    Forced sexual activity: No  Other Topics Concern  . Not on file  Social  History Narrative   Regular Exercise -  Active but no regular exercise routine   Daily Caffeine Use:  NO          Family History  Problem Relation Age of Onset  . Arthritis Mother   . Heart disease Mother   . Arthritis Father   . Stroke Brother   . Heart disease Brother   . Cancer Neg Hx      Current Outpatient Medications:  .  Acetaminophen (TYLENOL ARTHRITIS PAIN PO), Take by mouth 2 (two) times daily as needed., Disp: , Rfl:  .  amLODipine-benazepril (LOTREL) 5-20 MG capsule, TAKE 1 CAPSULE BY MOUTH  DAILY, Disp: 90 capsule, Rfl: 3 .  cholecalciferol (VITAMIN D) 1000 units tablet, Take 1,000 Units by mouth daily., Disp: , Rfl:  .  docusate sodium (COLACE) 100 MG capsule, Take 100 mg by mouth daily., Disp: , Rfl:  .  loratadine (CLARITIN) 10 MG tablet, TAKE 1 TABLET BY MOUTH EVERY DAY (Patient taking differently: Take 10 mg by mouth daily as needed. ), Disp: 30  tablet, Rfl: 1 .  Multiple Vitamin (MULTIVITAMIN WITH MINERALS) TABS tablet, Take 1 tablet by mouth daily. CENTRUM SILVER, Disp: , Rfl:  .  Omega-3 Fatty Acids (FISH OIL) 1000 MG CAPS, Take 1,000-2,000 mg by mouth daily. TAKE 2 CAPSULES IN THE MORNING & 1 CAPSULE AT NIGHT, Disp: , Rfl:  .  omeprazole (PRILOSEC) 20 MG capsule, Take 1 capsule (20 mg total) by mouth daily., Disp: 90 capsule, Rfl: 3 .  simvastatin (ZOCOR) 20 MG tablet, Take 1 tablet (20 mg total) by mouth every evening., Disp: 90 tablet, Rfl: 1 .  Turmeric 450 MG CAPS, Take by mouth. Taken daily., Disp: , Rfl:   Current Facility-Administered Medications:  .  albuterol (PROVENTIL) (2.5 MG/3ML) 0.083% nebulizer solution 2.5 mg, 2.5 mg, Nebulization, Once, Arnett, Yvetta Coder, FNP  No results found.  No images are attached to the encounter.   CMP Latest Ref Rng & Units 02/15/2019  Glucose 70 - 99 mg/dL 104(H)  BUN 6 - 23 mg/dL 28(H)  Creatinine 0.40 - 1.20 mg/dL 1.08  Sodium 135 - 145 mEq/L 139  Potassium 3.5 - 5.1 mEq/L 4.6  Chloride 96 - 112 mEq/L 102  CO2 19 - 32 mEq/L 29  Calcium 8.4 - 10.5 mg/dL 10.4  Total Protein 6.0 - 8.3 g/dL 7.9  Total Bilirubin 0.2 - 1.2 mg/dL 0.5  Alkaline Phos 39 - 117 U/L 69  AST 0 - 37 U/L 21  ALT 0 - 35 U/L 19   CBC Latest Ref Rng & Units 03/10/2019  WBC 4.0 - 10.5 K/uL 11.0(H)  Hemoglobin 12.0 - 15.0 g/dL 12.5  Hematocrit 36.0 - 46.0 % 36.5  Platelets 150 - 400 K/uL 196    Assessment and plan: Patient is a 85 year old female who was referred to [place] for incidentally found cryoglobulin/cold agglutinin on her blood work  I discussed the results of the blood work with the patient in detail.  Patient has a small amount of cold agglutinin titer of 1:8.  No cryoglobulins were detected.  She is not anemic at this time an extensive work-up for anemia/cryoglobulins was negative.  Cold agglutinins can cause hemolytic anemia but presently patient has no evidence of hemolysis or hemolytic anemia.  Cold  agglutinins can be detected incidentally following viral infections and as such she does not need any further work-up since she is not overtly anemic.  I gave her the option to follow-up with me with a  repeat CBC with differential in 6 months versus continue to follow-up with Dr. Caryl Bis.  Patient wishes to follow-up with Dr. Caryl Bis at this time and we will inform him as well.  She can be referred to [place] in the future if there is evidence of worsening anemia  Follow-up instructions: No follow-up needed  I discussed the assessment and treatment plan with the patient. The patient was provided an opportunity to ask questions and all were answered. The patient agreed with the plan and demonstrated an understanding of the instructions.   The patient was advised to call back or seek an in-person evaluation if the symptoms worsen or if the condition fails to improve as anticipated.    Visit Diagnosis: 1. 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 Randa Evens, MD, MPH [facility] [phone] 03/22/2019 8:28 AM

## 2019-03-29 DIAGNOSIS — M48062 Spinal stenosis, lumbar region with neurogenic claudication: Secondary | ICD-10-CM | POA: Diagnosis not present

## 2019-03-29 DIAGNOSIS — M6281 Muscle weakness (generalized): Secondary | ICD-10-CM | POA: Diagnosis not present

## 2019-03-30 ENCOUNTER — Other Ambulatory Visit: Payer: Self-pay

## 2019-03-30 ENCOUNTER — Ambulatory Visit
Admission: RE | Admit: 2019-03-30 | Discharge: 2019-03-30 | Disposition: A | Discharge status: Home / Self Care | Payer: Medicare Other | Source: Ambulatory Visit | Attending: Student | Admitting: Student

## 2019-03-30 DIAGNOSIS — M4807 Spinal stenosis, lumbosacral region: Secondary | ICD-10-CM | POA: Diagnosis not present

## 2019-03-30 DIAGNOSIS — M5136 Other intervertebral disc degeneration, lumbar region: Secondary | ICD-10-CM | POA: Diagnosis not present

## 2019-03-30 DIAGNOSIS — M48061 Spinal stenosis, lumbar region without neurogenic claudication: Secondary | ICD-10-CM | POA: Diagnosis not present

## 2019-03-30 DIAGNOSIS — M5125 Other intervertebral disc displacement, thoracolumbar region: Secondary | ICD-10-CM | POA: Diagnosis not present

## 2019-03-30 DIAGNOSIS — M47815 Spondylosis without myelopathy or radiculopathy, thoracolumbar region: Secondary | ICD-10-CM | POA: Diagnosis not present

## 2019-03-30 DIAGNOSIS — M4316 Spondylolisthesis, lumbar region: Secondary | ICD-10-CM | POA: Diagnosis not present

## 2019-04-12 DIAGNOSIS — M6281 Muscle weakness (generalized): Secondary | ICD-10-CM | POA: Diagnosis not present

## 2019-04-18 DIAGNOSIS — Z96611 Presence of right artificial shoulder joint: Secondary | ICD-10-CM | POA: Diagnosis not present

## 2019-04-18 DIAGNOSIS — M25611 Stiffness of right shoulder, not elsewhere classified: Secondary | ICD-10-CM | POA: Diagnosis not present

## 2019-04-18 DIAGNOSIS — M6281 Muscle weakness (generalized): Secondary | ICD-10-CM | POA: Diagnosis not present

## 2019-04-18 DIAGNOSIS — M48062 Spinal stenosis, lumbar region with neurogenic claudication: Secondary | ICD-10-CM | POA: Diagnosis not present

## 2019-04-21 DIAGNOSIS — M25611 Stiffness of right shoulder, not elsewhere classified: Secondary | ICD-10-CM | POA: Diagnosis not present

## 2019-04-21 DIAGNOSIS — M6281 Muscle weakness (generalized): Secondary | ICD-10-CM | POA: Diagnosis not present

## 2019-04-21 DIAGNOSIS — M48062 Spinal stenosis, lumbar region with neurogenic claudication: Secondary | ICD-10-CM | POA: Diagnosis not present

## 2019-04-21 DIAGNOSIS — Z96611 Presence of right artificial shoulder joint: Secondary | ICD-10-CM | POA: Diagnosis not present

## 2019-04-29 DIAGNOSIS — M6281 Muscle weakness (generalized): Secondary | ICD-10-CM | POA: Diagnosis not present

## 2019-04-29 DIAGNOSIS — M48062 Spinal stenosis, lumbar region with neurogenic claudication: Secondary | ICD-10-CM | POA: Diagnosis not present

## 2019-05-05 DIAGNOSIS — M6281 Muscle weakness (generalized): Secondary | ICD-10-CM | POA: Diagnosis not present

## 2019-05-06 ENCOUNTER — Other Ambulatory Visit: Payer: Self-pay | Admitting: Student

## 2019-05-06 DIAGNOSIS — Z981 Arthrodesis status: Secondary | ICD-10-CM

## 2019-05-06 DIAGNOSIS — M5416 Radiculopathy, lumbar region: Secondary | ICD-10-CM

## 2019-05-12 ENCOUNTER — Other Ambulatory Visit: Payer: Self-pay

## 2019-05-12 ENCOUNTER — Ambulatory Visit
Admission: RE | Admit: 2019-05-12 | Discharge: 2019-05-12 | Disposition: A | Discharge status: Home / Self Care | Payer: Medicare Other | Source: Ambulatory Visit | Attending: Student | Admitting: Student

## 2019-05-12 DIAGNOSIS — Z981 Arthrodesis status: Secondary | ICD-10-CM

## 2019-05-12 DIAGNOSIS — M48061 Spinal stenosis, lumbar region without neurogenic claudication: Secondary | ICD-10-CM | POA: Diagnosis not present

## 2019-05-12 DIAGNOSIS — M47816 Spondylosis without myelopathy or radiculopathy, lumbar region: Secondary | ICD-10-CM | POA: Diagnosis not present

## 2019-05-12 DIAGNOSIS — M5416 Radiculopathy, lumbar region: Secondary | ICD-10-CM | POA: Diagnosis not present

## 2019-05-24 DIAGNOSIS — Z981 Arthrodesis status: Secondary | ICD-10-CM | POA: Diagnosis not present

## 2019-05-24 DIAGNOSIS — M48062 Spinal stenosis, lumbar region with neurogenic claudication: Secondary | ICD-10-CM | POA: Diagnosis not present

## 2019-05-31 DIAGNOSIS — M5136 Other intervertebral disc degeneration, lumbar region: Secondary | ICD-10-CM | POA: Diagnosis not present

## 2019-05-31 DIAGNOSIS — M5416 Radiculopathy, lumbar region: Secondary | ICD-10-CM | POA: Diagnosis not present

## 2019-05-31 DIAGNOSIS — M48062 Spinal stenosis, lumbar region with neurogenic claudication: Secondary | ICD-10-CM | POA: Diagnosis not present

## 2019-06-15 DIAGNOSIS — N183 Chronic kidney disease, stage 3 unspecified: Secondary | ICD-10-CM | POA: Diagnosis not present

## 2019-06-15 DIAGNOSIS — D649 Anemia, unspecified: Secondary | ICD-10-CM | POA: Diagnosis not present

## 2019-06-15 DIAGNOSIS — R809 Proteinuria, unspecified: Secondary | ICD-10-CM | POA: Diagnosis not present

## 2019-06-15 DIAGNOSIS — N390 Urinary tract infection, site not specified: Secondary | ICD-10-CM | POA: Diagnosis not present

## 2019-06-15 DIAGNOSIS — I10 Essential (primary) hypertension: Secondary | ICD-10-CM | POA: Diagnosis not present

## 2019-06-20 DIAGNOSIS — I10 Essential (primary) hypertension: Secondary | ICD-10-CM | POA: Diagnosis not present

## 2019-06-20 DIAGNOSIS — R809 Proteinuria, unspecified: Secondary | ICD-10-CM | POA: Diagnosis not present

## 2019-06-20 DIAGNOSIS — N183 Chronic kidney disease, stage 3 unspecified: Secondary | ICD-10-CM | POA: Diagnosis not present

## 2019-06-20 DIAGNOSIS — D649 Anemia, unspecified: Secondary | ICD-10-CM | POA: Diagnosis not present

## 2019-06-20 DIAGNOSIS — N390 Urinary tract infection, site not specified: Secondary | ICD-10-CM | POA: Diagnosis not present

## 2019-06-21 DIAGNOSIS — N1832 Chronic kidney disease, stage 3b: Secondary | ICD-10-CM | POA: Diagnosis not present

## 2019-06-21 DIAGNOSIS — I129 Hypertensive chronic kidney disease with stage 1 through stage 4 chronic kidney disease, or unspecified chronic kidney disease: Secondary | ICD-10-CM | POA: Diagnosis not present

## 2019-06-29 DIAGNOSIS — M48062 Spinal stenosis, lumbar region with neurogenic claudication: Secondary | ICD-10-CM | POA: Diagnosis not present

## 2019-06-29 DIAGNOSIS — M5136 Other intervertebral disc degeneration, lumbar region: Secondary | ICD-10-CM | POA: Diagnosis not present

## 2019-06-29 DIAGNOSIS — M5416 Radiculopathy, lumbar region: Secondary | ICD-10-CM | POA: Diagnosis not present

## 2019-07-12 ENCOUNTER — Other Ambulatory Visit: Payer: Self-pay

## 2019-07-12 ENCOUNTER — Telehealth: Payer: Self-pay | Admitting: Gastroenterology

## 2019-07-12 DIAGNOSIS — K269 Duodenal ulcer, unspecified as acute or chronic, without hemorrhage or perforation: Secondary | ICD-10-CM

## 2019-07-12 MED ORDER — OMEPRAZOLE 20 MG PO CPDR: 20.0000 mg | DELAYED_RELEASE_CAPSULE | Freq: Every day | ORAL | 3 refills | Status: AC

## 2019-07-12 NOTE — Telephone Encounter (Signed)
Pt left vm she states she did not know if Dr. Vicente Males wanted her to have a refill on rx Omeprazole please call pt

## 2019-07-12 NOTE — Telephone Encounter (Signed)
Spoke with pt and informed her that according to Dr. Georgeann Oppenheim plan in pt's last office note, pt should continue the 20 mg omeprazole long term due to large hiatal hernia. Pt agrees and requests refills to be sent to [facility] in [place].

## 2019-07-20 DIAGNOSIS — M5416 Radiculopathy, lumbar region: Secondary | ICD-10-CM | POA: Diagnosis not present

## 2019-07-20 DIAGNOSIS — M48062 Spinal stenosis, lumbar region with neurogenic claudication: Secondary | ICD-10-CM | POA: Diagnosis not present

## 2019-07-20 DIAGNOSIS — M5136 Other intervertebral disc degeneration, lumbar region: Secondary | ICD-10-CM | POA: Diagnosis not present

## 2019-07-26 ENCOUNTER — Other Ambulatory Visit: Payer: Self-pay

## 2019-07-26 ENCOUNTER — Ambulatory Visit: Payer: Medicare Other | Admitting: Family Medicine

## 2019-07-26 ENCOUNTER — Ambulatory Visit (INDEPENDENT_AMBULATORY_CARE_PROVIDER_SITE_OTHER): Payer: Medicare Other

## 2019-07-26 DIAGNOSIS — Z0000 Encounter for general adult medical examination without abnormal findings: Secondary | ICD-10-CM | POA: Diagnosis not present

## 2019-07-26 NOTE — Patient Instructions (Addendum)
  Robin Alexander , Thank you for taking time to come for your Medicare Wellness Visit. I appreciate your ongoing commitment to your health goals. Please review the following plan we discussed and let me know if I can assist you in the future.   These are the goals we discussed: Goals      Patient Stated   . Increase physical activity (pt-stated)     I want to strengthen my legs and start using my stationary bike       This is a list of the screening recommended for you and due dates:  Health Maintenance  Topic Date Due  . Flu Shot  04/02/2019  . Tetanus Vaccine  05/22/2025  . DEXA scan (bone density measurement)  Completed  . Pneumonia vaccines  Completed

## 2019-07-26 NOTE — Progress Notes (Signed)
Subjective:   Robin Alexander is a 85 y.o. female who presents for Medicare Annual (Subsequent) preventive examination.  Review of Systems:  No ROS.  Medicare Wellness Virtual Visit.  Visual/audio telehealth visit, UTA vital signs.   See social history for additional risk factors.   Cardiac Risk Factors include: advanced age (>27men, >14 women);hypertension     Objective:     Vitals: There were no vitals taken for this visit.  There is no height or weight on file to calculate BMI.  Advanced Directives 07/26/2019 03/21/2019 03/10/2019 07/23/2018 04/26/2018 03/03/2018 03/02/2018  Does Patient Have a Medical Advance Directive? Yes Yes Yes Yes Yes Yes Yes  Type of Paramedic of [place];Living will [place];Living will [place];Living will [place];Living will [place];Living will Living will Living will  Does patient want to make changes to medical advance directive? No - Patient declined No - Patient declined - No - Patient declined - - No - Patient declined  Copy of [place] in Chart? Yes - validated most recent copy scanned in chart (See row information) Yes - validated most recent copy scanned in chart (See row information) No - copy requested Yes - validated most recent copy scanned in chart (See row information) Yes Yes -    Tobacco Social History   Tobacco Use  Smoking Status Former Smoker  . Packs/day: 0.75  . Years: 20.00  . Pack years: 15.00  . Types: Cigarettes  . Quit date: 09/02/1983  . Years since quitting: 35.9  Smokeless Tobacco Never Used     Counseling given: Not Answered   Clinical Intake:  Pre-visit preparation completed: Yes        Diabetes: No  How often do you need to have someone help you when you read instructions, pamphlets, or other written materials from your doctor or pharmacy?: 1 - Never  Interpreter Needed?: No      Past Medical History:  Diagnosis Date  . Abnormal CT scan, chest 03/20/2014  . Anemia   . Arthritis    shoulders  . Chicken pox   . Cold agglutinin disease   . Complication of anesthesia 2016   slow to wake up after anesthesia  . Dyspnea    uses inhaler when she gets bronchitis  . GERD (gastroesophageal reflux disease)   . Heart murmur   . High cholesterol   . HTN (hypertension)   . Multiple duodenal ulcers   . Pneumonia    spring 2015   Past Surgical History:  Procedure Laterality Date  . ABDOMINAL HYSTERECTOMY     in 50's  . ANTERIOR LAT LUMBAR FUSION Right 02/28/2015   Procedure: ANTERIOR LATERAL LUMBAR FUSION 1 LEVEL;  Surgeon: Phylliss Bob, MD;  Location: [place];  Service: Orthopedics;  Laterality: Right;  Right sided lumbar 3-4 lateral interbody fusion  . APPENDECTOMY  1950  . BACK SURGERY     rod in back  . BICEPT TENODESIS Right 11/20/2017   Procedure: BICEPS TENODESIS;  Surgeon: Leim Fabry, MD;  Location: [facility];  Service: Orthopedics;  Laterality: Right;  . CATARACT EXTRACTION W/ INTRAOCULAR LENS  IMPLANT, BILATERAL Bilateral 2011  . CESAREAN SECTION     2  . COLONOSCOPY WITH PROPOFOL N/A 03/03/2018   Procedure: COLONOSCOPY WITH PROPOFOL;  Surgeon: Jonathon Bellows, MD;  Location[facility] ENDOSCOPY;  Service: Gastroenterology;  Laterality: N/A;  . ESOPHAGOGASTRODUODENOSCOPY (EGD) WITH PROPOFOL N/A 03/03/2018   Procedure: ESOPHAGOGASTRODUODENOSCOPY (  EGD) WITH PROPOFOL;  Surgeon: Jonathon Bellows, MD;  Location[facility] ENDOSCOPY;  Service: Gastroenterology;  Laterality: N/A;  . ESOPHAGOGASTRODUODENOSCOPY (EGD) WITH PROPOFOL N/A 04/26/2018   Procedure: ESOPHAGOGASTRODUODENOSCOPY (EGD) WITH PROPOFOL;  Surgeon: Jonathon Bellows, MD;  Location[facility] ENDOSCOPY;  Service: Gastroenterology;  Laterality: N/A;  . JOINT REPLACEMENT    . REVERSE SHOULDER ARTHROPLASTY Right 11/20/2017   Procedure: REVERSE SHOULDER ARTHROPLASTY;  Surgeon: Leim Fabry, MD;  Location: [facility];  Service: Orthopedics;   Laterality: Right;  . ROTATOR CUFF REPAIR Right    right shoulder   10 YRS  . TOTAL HIP ARTHROPLASTY Bilateral 2005, 2006   Bilateral, [place]   Family History  Problem Relation Age of Onset  . Arthritis Mother   . Heart disease Mother   . Arthritis Father   . Stroke Brother   . Heart disease Brother   . Cancer Neg Hx    Social History   Socioeconomic History  . Marital status: Married    Spouse name: Not on file  . Number of children: 2  . Years of education: Not on file  . Highest education level: Not on file  Occupational History  . Occupation: Retired - Product/process development scientist: RETIRED  Social Needs  . Financial resource strain: Not hard at all  . Food insecurity    Worry: Never true    Inability: Never true  . Transportation needs    Medical: No    Non-medical: No  Tobacco Use  . Smoking status: Former Smoker    Packs/day: 0.75    Years: 20.00    Pack years: 15.00    Types: Cigarettes    Quit date: 09/02/1983    Years since quitting: 35.9  . Smokeless tobacco: Never Used  Substance and Sexual Activity  . Alcohol use: Yes    Alcohol/week: 0.0 standard drinks    Comment: Occasional wine,none last 24hrs  . Drug use: No  . Sexual activity: Yes    Birth control/protection: Post-menopausal, Surgical  Lifestyle  . Physical activity    Days per week: Not on file    Minutes per session: Not on file  . Stress: Not at all  Relationships  . Social Herbalist on phone: Patient refused    Gets together: Patient refused    Attends religious service: Patient refused    Active member of club or organization: Patient refused    Attends meetings of clubs or organizations: Patient refused    Relationship status: Patient refused  Other Topics Concern  . Not on file  Social History Narrative   Regular Exercise -  Active but no regular exercise routine   Daily Caffeine Use:  NO          Outpatient Encounter Medications as of 07/26/2019  Medication Sig   . Acetaminophen (TYLENOL ARTHRITIS PAIN PO) Take by mouth 2 (two) times daily as needed.  Marland Kitchen amLODipine-benazepril (LOTREL) 5-20 MG capsule TAKE 1 CAPSULE BY MOUTH  DAILY  . cholecalciferol (VITAMIN D) 1000 units tablet Take 1,000 Units by mouth daily.  Marland Kitchen docusate sodium (COLACE) 100 MG capsule Take 100 mg by mouth daily.  Marland Kitchen loratadine (CLARITIN) 10 MG tablet TAKE 1 TABLET BY MOUTH EVERY DAY (Patient taking differently: Take 10 mg by mouth daily as needed. )  . Multiple Vitamin (MULTIVITAMIN WITH MINERALS) TABS tablet Take 1 tablet by mouth daily. CENTRUM SILVER  . Omega-3 Fatty Acids (FISH OIL) 1000 MG CAPS Take 1,000-2,000 mg by mouth daily. TAKE  2 CAPSULES IN THE MORNING & 1 CAPSULE AT NIGHT  . omeprazole (PRILOSEC) 20 MG capsule Take 1 capsule (20 mg total) by mouth daily.  . simvastatin (ZOCOR) 20 MG tablet Take 1 tablet (20 mg total) by mouth every evening.  . Turmeric 450 MG CAPS Take by mouth. Taken daily.   Facility-Administered Encounter Medications as of 07/26/2019  Medication  . albuterol (PROVENTIL) (2.5 MG/3ML) 0.083% nebulizer solution 2.5 mg    Activities of Daily Living In your present state of health, do you have any difficulty performing the following activities: 07/26/2019  Hearing? N  Vision? N  Difficulty concentrating or making decisions? N  Walking or climbing stairs? Y  Comment Unsteady gait. Cane or walker in use.  Dressing or bathing? N  Doing errands, shopping? N  Preparing Food and eating ? N  Using the Toilet? N  In the past six months, have you accidently leaked urine? N  Do you have problems with loss of bowel control? N  Managing your Medications? N  Managing your Finances? N  Housekeeping or managing your Housekeeping? N  Some recent data might be hidden    Patient Care Team: Leone Haven, MD as PCP - General (Family Medicine)    Assessment:   This is a routine wellness examination for [place].  Nurse connected with patient 07/26/19 at  10:00 AM EST by a telephone enabled telemedicine application and verified that I am speaking with the correct person using two identifiers. Patient stated full name and DOB. Patient gave permission to continue with virtual visit. Patient's location was at home and Nurse's location was at [place] office.   Health Maintenance Due: See completed HM at the end of note.   Eye: Visual acuity not assessed. Virtual visit. Wears corrective lenses. Followed by their ophthalmologist every 12 months.   Dental: UTD  Hearing: Demonstrates normal hearing during visit.  Safety:  Patient feels safe at home- yes Patient does have smoke detectors at home- yes Patient does wear sunscreen or protective clothing when in direct sunlight - yes Patient does wear seat belt when in a moving vehicle - yes Patient drives- yes Adequate lighting in walkways free from debris- yes Grab bars and handrails used as appropriate- yes Ambulates with cane/walker assistive device Cell phone on person when ambulating outside of the home- yes  Social: Alcohol intake - yes      Smoking history- former   Smokers in home? none Illicit drug use? none  Depression: PHQ 2 &9 complete. See screening below. Denies irritability, anhedonia, sadness/tearfullness.    Falls: See screening below.    Medication: Taking as directed and without issues.   Covid-19: Precautions and sickness symptoms discussed. Wears mask, social distancing, hand hygiene as appropriate.   Activities of Daily Living Patient denies needing assistance with: household chores, feeding themselves, getting from bed to chair, getting to the toilet, bathing/showering, dressing, managing money, or preparing meals.  Assisted by husband with household chores as needed.   Memory: Patient is alert. Patient denies difficulty focusing or concentrating. Correctly identified the president of the [place], season and recall. Patient likes to read, play cards and  completes puzzles for brain stimulation.   BMI- discussed the importance of a healthy diet, water intake and the benefits of aerobic exercise.  Educational material provided.  Physical activity- walking limited  Diet:  Regular Water: good intake  Other Providers Patient Care Team: Leone Haven, MD as PCP - General (Family Medicine)  Exercise Activities and  Dietary recommendations Current Exercise Habits: Home exercise routine, Intensity: Mild  Goals      Patient Stated   . Increase physical activity (pt-stated)     I want to strengthen my legs and start using my stationary bike       Fall Risk Fall Risk  07/26/2019 08/10/2018 07/23/2018 07/22/2017 01/05/2017  Falls in the past year? 0 1 0 No No  Number falls in past yr: - 0 - - -  Injury with Fall? - 0 - - -  Follow up Falls prevention discussed;Education provided - - - -   Timed Get Up and Go performed: no, virtual visit  Depression Screen PHQ 2/9 Scores 07/26/2019 08/10/2018 07/23/2018 07/22/2017  PHQ - 2 Score 0 0 0 0  PHQ- 9 Score - - - 0     Cognitive Function MMSE - Mini Mental State Exam 07/22/2017 05/23/2016 05/23/2015  Orientation to time 5 5 5   Orientation to Place 5 5 5   Registration 3 3 3   Attention/ Calculation 5 5 5   Recall 3 3 3   Language- name 2 objects 2 2 2   Language- repeat 1 1 1   Language- follow 3 step command 3 3 3   Language- read & follow direction 1 1 1   Write a sentence 1 1 1   Copy design 1 1 1   Total score 30 30 30      6CIT Screen 07/26/2019 07/23/2018  What Year? 0 points 0 points  What month? 0 points 0 points  What time? 0 points 0 points  Count back from 20 0 points 0 points  Months in reverse 0 points 0 points  Repeat phrase 0 points 0 points  Total Score 0 0    Immunization History  Administered Date(s) Administered  . Influenza Split 07/31/2011, 06/29/2012  . Influenza, High Dose Seasonal PF 05/23/2015, 05/02/2016, 06/17/2017, 07/05/2018, 06/28/2019  .  Influenza,inj,Quad PF,6+ Mos 05/04/2013, 05/15/2014  . Pneumococcal Conjugate-13 11/03/2013  . Pneumococcal Polysaccharide-23 09/01/2009  . Tdap 05/23/2015  . Zoster 07/30/2010   Screening Tests Health Maintenance  Topic Date Due  . INFLUENZA VACCINE  04/02/2019  . TETANUS/TDAP  05/22/2025  . DEXA SCAN  Completed  . PNA vac Low Risk Adult  Completed      Plan:   Keep all routine maintenance appointments.   Follow up 08/16/19 @ 12:30  Medicare Attestation I have personally reviewed: The patient's medical and social history Their use of alcohol, tobacco or illicit drugs Their current medications and supplements The patient's functional ability including ADLs,fall risks, home safety risks, cognitive, and hearing and visual impairment Diet and physical activities Evidence for depression   In addition, I have reviewed and discussed with patient certain preventive protocols, quality metrics, and best practice recommendations. A written personalized care plan for preventive services as well as general preventive health recommendations were provided to patient via mail.     Varney Biles, LPN  [phone]

## 2019-08-16 ENCOUNTER — Ambulatory Visit (INDEPENDENT_AMBULATORY_CARE_PROVIDER_SITE_OTHER): Payer: Medicare Other | Admitting: Family Medicine

## 2019-08-16 ENCOUNTER — Encounter: Payer: Self-pay | Admitting: Family Medicine

## 2019-08-16 ENCOUNTER — Other Ambulatory Visit: Payer: Self-pay

## 2019-08-16 VITALS — Ht 63.0 in | Wt 147.0 lb

## 2019-08-16 DIAGNOSIS — E785 Hyperlipidemia, unspecified: Secondary | ICD-10-CM | POA: Diagnosis not present

## 2019-08-16 DIAGNOSIS — N3000 Acute cystitis without hematuria: Secondary | ICD-10-CM | POA: Diagnosis not present

## 2019-08-16 DIAGNOSIS — M545 Low back pain: Secondary | ICD-10-CM | POA: Diagnosis not present

## 2019-08-16 DIAGNOSIS — I10 Essential (primary) hypertension: Secondary | ICD-10-CM | POA: Diagnosis not present

## 2019-08-16 DIAGNOSIS — K219 Gastro-esophageal reflux disease without esophagitis: Secondary | ICD-10-CM | POA: Diagnosis not present

## 2019-08-16 DIAGNOSIS — G8929 Other chronic pain: Secondary | ICD-10-CM

## 2019-08-16 NOTE — Progress Notes (Signed)
Virtual Visit via video Note  This visit type was conducted due to national recommendations for restrictions regarding the COVID-19 pandemic (e.g. social distancing).  This format is felt to be most appropriate for this patient at this time.  All issues noted in this document were discussed and addressed.  No physical exam was performed (except for noted visual exam findings with Video Visits).   I connected with Robin Alexander today at 12:30 PM EST by a video enabled telemedicine application and verified that I am speaking with the correct person using two identifiers. Location patient: home Location provider: work Persons participating in the virtual visit: patient, provider, Kalia Andreen (husband)  I discussed the limitations, risks, security and privacy concerns of performing an evaluation and management service by telephone and the availability of in person appointments. I also discussed with the patient that there may be a patient responsible charge related to this service. The patient expressed understanding and agreed to proceed.  Alexander for visit: f/u  HPI: Hypertension: Typically 130 over 22s.  Taking amlodipine benazepril.  No chest pain or shortness of breath.  She has chronic intermittent pedal edema.  This goes down overnight.  No orthopnea or PND.  Chronic back pain: This continues to be an issue.  She had a series of back injections with minimal benefit.  She wonders about doing physical therapy in January.  Hyperlipidemia: Taking simvastatin.  No right upper quadrant pain or myalgias.  GERD: Continues on omeprazole.  No reflux, abdominal pain, blood in her stool, or dysphagia.  UTI: She was treated for a UTI through nephrology.  She took an antibiotic though still has some mild dysuria at the end of her urinary stream.  She notes no external irritation.  Her urinary frequency did improve though she does still have some urgency.  No blood in her urine.  No vaginal  discharge.  No abdominal pain.  No fevers.   ROS: See pertinent positives and negatives per HPI.  Past Medical History:  Diagnosis Date  . Abnormal CT scan, chest 03/20/2014  . Anemia   . Arthritis    shoulders  . Chicken pox   . Cold agglutinin disease   . Complication of anesthesia 2016   slow to wake up after anesthesia  . Dyspnea    uses inhaler when she gets bronchitis  . GERD (gastroesophageal reflux disease)   . GI bleed 03/16/2018  . Heart murmur   . High cholesterol   . HTN (hypertension)   . Multiple duodenal ulcers   . Pneumonia    spring 2015    Past Surgical History:  Procedure Laterality Date  . ABDOMINAL HYSTERECTOMY     in 50's  . ANTERIOR LAT LUMBAR FUSION Right 02/28/2015   Procedure: ANTERIOR LATERAL LUMBAR FUSION 1 LEVEL;  Surgeon: Phylliss Bob, MD;  Location: [place];  Service: Orthopedics;  Laterality: Right;  Right sided lumbar 3-4 lateral interbody fusion  . APPENDECTOMY  1950  . BACK SURGERY     rod in back  . BICEPT TENODESIS Right 11/20/2017   Procedure: BICEPS TENODESIS;  Surgeon: Leim Fabry, MD;  Location: [facility];  Service: Orthopedics;  Laterality: Right;  . CATARACT EXTRACTION W/ INTRAOCULAR LENS  IMPLANT, BILATERAL Bilateral 2011  . CESAREAN SECTION     2  . COLONOSCOPY WITH PROPOFOL N/A 03/03/2018   Procedure: COLONOSCOPY WITH PROPOFOL;  Surgeon: Jonathon Bellows, MD;  Location: [facility] ENDOSCOPY;  Service: Gastroenterology;  Laterality: N/A;  . ESOPHAGOGASTRODUODENOSCOPY (EGD) WITH PROPOFOL  N/A 03/03/2018   Procedure: ESOPHAGOGASTRODUODENOSCOPY (EGD) WITH PROPOFOL;  Surgeon: Jonathon Bellows, MD;  Location: [facility] ENDOSCOPY;  Service: Gastroenterology;  Laterality: N/A;  . ESOPHAGOGASTRODUODENOSCOPY (EGD) WITH PROPOFOL N/A 04/26/2018   Procedure: ESOPHAGOGASTRODUODENOSCOPY (EGD) WITH PROPOFOL;  Surgeon: Jonathon Bellows, MD;  Location: [facility] ENDOSCOPY;  Service: Gastroenterology;  Laterality: N/A;  . JOINT REPLACEMENT    . REVERSE SHOULDER ARTHROPLASTY Right  11/20/2017   Procedure: REVERSE SHOULDER ARTHROPLASTY;  Surgeon: Leim Fabry, MD;  Location: [facility];  Service: Orthopedics;  Laterality: Right;  . ROTATOR CUFF REPAIR Right    right shoulder   10 YRS  . TOTAL HIP ARTHROPLASTY Bilateral 2005, 2006   Bilateral, [place]    Family History  Problem Relation Age of Onset  . Arthritis Mother   . Heart disease Mother   . Arthritis Father   . Stroke Brother   . Heart disease Brother   . Cancer Neg Hx     SOCIAL HX: Former smoker   Current Outpatient Medications:  .  Acetaminophen (TYLENOL ARTHRITIS PAIN PO), Take by mouth 2 (two) times daily as needed., Disp: , Rfl:  .  amLODipine-benazepril (LOTREL) 5-20 MG capsule, TAKE 1 CAPSULE BY MOUTH  DAILY, Disp: 90 capsule, Rfl: 3 .  cholecalciferol (VITAMIN D) 1000 units tablet, Take 1,000 Units by mouth daily., Disp: , Rfl:  .  docusate sodium (COLACE) 100 MG capsule, Take 100 mg by mouth daily., Disp: , Rfl:  .  loratadine (CLARITIN) 10 MG tablet, TAKE 1 TABLET BY MOUTH EVERY DAY (Patient taking differently: Take 10 mg by mouth daily as needed. ), Disp: 30 tablet, Rfl: 1 .  Multiple Vitamin (MULTIVITAMIN WITH MINERALS) TABS tablet, Take 1 tablet by mouth daily. CENTRUM SILVER, Disp: , Rfl:  .  Omega-3 Fatty Acids (FISH OIL) 1000 MG CAPS, Take 1,000-2,000 mg by mouth daily. TAKE 2 CAPSULES IN THE MORNING & 1 CAPSULE AT NIGHT, Disp: , Rfl:  .  omeprazole (PRILOSEC) 20 MG capsule, Take 1 capsule (20 mg total) by mouth daily., Disp: 90 capsule, Rfl: 3 .  simvastatin (ZOCOR) 20 MG tablet, Take 1 tablet (20 mg total) by mouth every evening., Disp: 90 tablet, Rfl: 1 .  Turmeric 450 MG CAPS, Take by mouth. Taken daily., Disp: , Rfl:   Current Facility-Administered Medications:  .  albuterol (PROVENTIL) (2.5 MG/3ML) 0.083% nebulizer solution 2.5 mg, 2.5 mg, Nebulization, Once, Arnett, Yvetta Coder, FNP  EXAM:  VITALS per patient if applicable: None.  GENERAL: alert, oriented, appears well and in  no acute distress  HEENT: atraumatic, conjunttiva clear, no obvious abnormalities on inspection of external nose and ears  NECK: normal movements of the head and neck  LUNGS: on inspection no signs of respiratory distress, breathing rate appears normal, no obvious gross SOB, gasping or wheezing  CV: no obvious cyanosis  MS: moves all visible extremities without noticeable abnormality  PSYCH/NEURO: pleasant and cooperative, no obvious depression or anxiety, speech and thought processing grossly intact  ASSESSMENT AND PLAN:  Discussed the following assessment and plan:  Hypertension Adequately controlled.  Continue current regimen.  Plan for lab work at next visit.  Metabolic panel reviewed through her nephrology office in care everywhere.  GERD (gastroesophageal reflux disease) Asymptomatic.  Continue omeprazole.  Chronic low back pain Refer for physical therapy.  Acute cystitis without hematuria Recent treatment for this.  She does still have some mild dysuria.  We will have her in for a urinalysis prior to treating again.  Hyperlipidemia Continue simvastatin.  I discussed the assessment and treatment plan with the patient. The patient was provided an opportunity to ask questions and all were answered. The patient agreed with the plan and demonstrated an understanding of the instructions.   The patient was advised to call back or seek an in-person evaluation if the symptoms worsen or if the condition fails to improve as anticipated.    Tommi Rumps, MD

## 2019-08-16 NOTE — Assessment & Plan Note (Signed)
Adequately controlled.  Continue current regimen.  Plan for lab work at next visit.  Metabolic panel reviewed through her nephrology office in care everywhere.

## 2019-08-16 NOTE — Assessment & Plan Note (Signed)
Continue simvastatin. 

## 2019-08-16 NOTE — Assessment & Plan Note (Signed)
Asymptomatic.  Continue omeprazole.

## 2019-08-16 NOTE — Assessment & Plan Note (Signed)
Recent treatment for this.  She does still have some mild dysuria.  We will have her in for a urinalysis prior to treating again.

## 2019-08-16 NOTE — Assessment & Plan Note (Signed)
Refer for physical therapy  

## 2019-08-18 ENCOUNTER — Other Ambulatory Visit: Payer: Self-pay | Admitting: Family Medicine

## 2019-08-18 ENCOUNTER — Other Ambulatory Visit: Payer: Self-pay

## 2019-08-18 ENCOUNTER — Other Ambulatory Visit (INDEPENDENT_AMBULATORY_CARE_PROVIDER_SITE_OTHER): Payer: Medicare Other

## 2019-08-18 DIAGNOSIS — N3000 Acute cystitis without hematuria: Secondary | ICD-10-CM

## 2019-08-18 DIAGNOSIS — R8290 Unspecified abnormal findings in urine: Secondary | ICD-10-CM

## 2019-08-18 LAB — POCT URINALYSIS DIPSTICK
Bilirubin, UA: NEGATIVE
Blood, UA: NEGATIVE
Glucose, UA: NEGATIVE
Leukocytes, UA: NEGATIVE
Nitrite, UA: NEGATIVE
Protein, UA: POSITIVE — AB
Spec Grav, UA: 1.015 (ref 1.010–1.025)
Urobilinogen, UA: 1 E.U./dL
pH, UA: 6 (ref 5.0–8.0)

## 2019-08-19 DIAGNOSIS — R8290 Unspecified abnormal findings in urine: Secondary | ICD-10-CM | POA: Diagnosis not present

## 2019-08-19 LAB — URINALYSIS, MICROSCOPIC ONLY: RBC / HPF: NONE SEEN (ref 0–?)

## 2019-08-19 NOTE — Addendum Note (Signed)
Addended by: Leeanne Rio on: 08/19/2019 08:22 AM   Modules accepted: Orders

## 2019-08-20 LAB — URINE CULTURE
MICRO NUMBER:: 1212710
Result:: NO GROWTH
SPECIMEN QUALITY:: ADEQUATE

## 2019-08-22 ENCOUNTER — Ambulatory Visit: Payer: Medicare Other | Attending: Internal Medicine

## 2019-08-22 DIAGNOSIS — Z20822 Contact with and (suspected) exposure to covid-19: Secondary | ICD-10-CM

## 2019-08-23 LAB — NOVEL CORONAVIRUS, NAA: SARS-CoV-2, NAA: NOT DETECTED

## 2019-08-27 ENCOUNTER — Other Ambulatory Visit: Payer: Self-pay | Admitting: Family Medicine

## 2019-08-30 ENCOUNTER — Encounter: Payer: Self-pay | Admitting: Physical Therapy

## 2019-08-30 ENCOUNTER — Ambulatory Visit: Payer: Medicare Other | Attending: Family Medicine | Admitting: Physical Therapy

## 2019-08-30 ENCOUNTER — Other Ambulatory Visit: Payer: Self-pay

## 2019-08-30 DIAGNOSIS — G8929 Other chronic pain: Secondary | ICD-10-CM | POA: Diagnosis not present

## 2019-08-30 DIAGNOSIS — M545 Low back pain, unspecified: Secondary | ICD-10-CM

## 2019-08-30 DIAGNOSIS — R2689 Other abnormalities of gait and mobility: Secondary | ICD-10-CM | POA: Insufficient documentation

## 2019-08-30 DIAGNOSIS — Z9181 History of falling: Secondary | ICD-10-CM | POA: Diagnosis not present

## 2019-08-30 NOTE — Therapy (Signed)
[facility] [address], [place], [address] Phone: [phone]   Fax:  [phone]  Physical Therapy Evaluation  Patient Details  Name: Robin Alexander MRN: [id number] Date of Birth: Feb 04, 1933 No data recorded  Encounter Date: 08/30/2019  PT End of Session - 08/30/19 1428    Visit Number  1    Number of Visits  17    Date for PT Re-Evaluation  10/26/19    PT Start Time  0230    PT Stop Time  0330    PT Time Calculation (min)  60 min    Activity Tolerance  Patient tolerated treatment well    Behavior During Therapy  [facility] for tasks assessed/performed       Past Medical History:  Diagnosis Date  . Abnormal CT scan, chest 03/20/2014  . Anemia   . Arthritis    shoulders  . Chicken pox   . Cold agglutinin disease   . Complication of anesthesia 2016   slow to wake up after anesthesia  . Dyspnea    uses inhaler when she gets bronchitis  . GERD (gastroesophageal reflux disease)   . GI bleed 03/16/2018  . Heart murmur   . High cholesterol   . HTN (hypertension)   . Multiple duodenal ulcers   . Pneumonia    spring 2015    Past Surgical History:  Procedure Laterality Date  . ABDOMINAL HYSTERECTOMY     in 50's  . ANTERIOR LAT LUMBAR FUSION Right 02/28/2015   Procedure: ANTERIOR LATERAL LUMBAR FUSION 1 LEVEL;  Surgeon: Phylliss Bob, MD;  Location: [facility];  Service: Orthopedics;  Laterality: Right;  Right sided lumbar 3-4 lateral interbody fusion  . APPENDECTOMY  1950  . BACK SURGERY     rod in back  . BICEPT TENODESIS Right 11/20/2017   Procedure: BICEPS TENODESIS;  Surgeon: Leim Fabry, MD;  Location: [facility];  Service: Orthopedics;  Laterality: Right;  . CATARACT EXTRACTION W/ INTRAOCULAR LENS  IMPLANT, BILATERAL Bilateral 2011  . CESAREAN SECTION     2  . COLONOSCOPY WITH PROPOFOL N/A 03/03/2018   Procedure: COLONOSCOPY WITH PROPOFOL;  Surgeon: Jonathon Bellows, MD;  Location: [facility];   Service: Gastroenterology;  Laterality: N/A;  . ESOPHAGOGASTRODUODENOSCOPY (EGD) WITH PROPOFOL N/A 03/03/2018   Procedure: ESOPHAGOGASTRODUODENOSCOPY (EGD) WITH PROPOFOL;  Surgeon: Jonathon Bellows, MD;  Location: [facility]COPY;  Service: Gastroenterology;  Laterality: N/A;  . ESOPHAGOGASTRODUODENOSCOPY (EGD) WITH PROPOFOL N/A 04/26/2018   Procedure: ESOPHAGOGASTRODUODENOSCOPY (EGD) WITH PROPOFOL;  Surgeon: Jonathon Bellows, MD;  [facility];  Service: Gastroenterology;  Laterality: N/A;  . JOINT REPLACEMENT    . REVERSE SHOULDER ARTHROPLASTY Right 11/20/2017   Procedure: REVERSE SHOULDER ARTHROPLASTY;  Surgeon: Leim Fabry, MD;  Location: [facility];  Service: Orthopedics;  Laterality: Right;  . ROTATOR CUFF REPAIR Right    right shoulder   10 YRS  . TOTAL HIP ARTHROPLASTY Bilateral 2005, 2006   Bilateral, [place]    There were no vitals filed for this visit.   Subjective Assessment - 08/30/19 1435    Pertinent History  Patient is an 85 year old female presenting with chronic LBP. Patient reports 2 years ago she had a fall at church that started her LBP and bilat LE weakness.Patient had a fusion of L4-S1 2016. Has had injections for pain over the past 2 years and they "used to work" but are no longer helping her pain. Reports LBP is midline without redicular symptoms into LEs;  denies numbness/tingling. Reports pain is sharp in the middle of middle of the spine following prolonged ambulation and standing. Rest helps her pain. Worst: 8/10 best: 0/10. Patient reports she started using SPC about 6 months ago when she noticed she had a "limp" when she walks. She lives with her husband who is physically independent, reports indpendence with ADLs, does have grab bars and shower seat in bathroom but does not use them. Has has 3 floors to her home, but stays on 1 level. Does have stairs in/out of garage (6 steps) with bilat handrails that she reports no issues using. Patient has a rail on the side of her  bed that she says assists her in supine<> sit transfers.  She can drives but prefers to have her husband. She enjoys working in her church and visit her friends, and wants to be able to feel comfortable walking without her cane to complete these community activies. Pt denies N/V, B&B changes, unexplained weight fluctuation, saddle paresthesia, fever, night sweats, or unrelenting night pain at this time.    Limitations  House hold activities;Lifting;Standing;Walking    How long can you sit comfortably?  unlimited    How long can you stand comfortably?  5-38mins    How long can you walk comfortably?  3mins    Diagnostic tests  MRI July 2020: Diffuse lumbar spine spondylosis as described above most severe at L2-3. At L2-3 there is a broad-based disc bulge. Moderate bilateral facet arthropathy. Moderate-severe spinal stenosis and bilateral ateral recess stenosis. Mild bilateral foraminal stenosis. CT September 2020 Moderate spinal stenosis at L2-3 as demonstrated on the priorMRI.    Patient Stated Goals  Walk without a cane    Currently in Pain?  Yes    Pain Score  0-No pain    Pain Location  Back    Pain Orientation  Lower    Pain Descriptors / Indicators  Sharp;Cramping    Pain Type  Chronic pain    Pain Radiating Towards  does not radiate    Pain Onset  More than a month ago    Pain Frequency  Intermittent    Aggravating Factors   walking, standing,    Pain Relieving Factors  rest    Effect of Pain on Daily Activities  unable to ambulate community distances       OBJECTIVE  Mental Status Patient is oriented to person, place and time.  Recent memory is intact.  Remote memory is intact.  Attention span and concentration are intact.  Expressive speech is intact.  Patient's fund of knowledge is within normal limits for educational level.  SENSATION: Grossly intact to light touch bilateral LEs as determined by testing dermatomes L2-S2 Proprioception and hot/cold testing deferred on this  date   MUSCULOSKELETAL: Tremor: None Bulk: Normal Tone: Normal No visible step-off along spinal column  Posture Lumbar lordosis: WNL Iliac crest height: equal bilaterally Lumbar lateral shift: negative Lower crossed syndrome (tight hip flexors and erector spinae; weak gluts and abs): negative  Gait SPC in RUE, R anterolateral trendelenberg, decreased L stance time, decreased heel strike and foot clearance bilat LLE cm shorter than RLE     Palpation TTP at R lumbar paraspinals, QL, and superior glute fibers   Strength (out of 5) R/L 4+/3+ Hip flexion 4/3+ Hip ER 4+/4 Hip IR 4-/3 Hip abduction 4/4 Hip adduction 3+/3- Hip extension 5/5 Knee extension 5/5 Knee flexion 4/4 Ankle dorsiflexion 5/5 Ankle plantarflexion 5 Trunk flexion 2+ Trunk extension 4/4 Trunk rotation  *  Indicates pain   AROM (degrees) R/L (all movements include overpressure unless otherwise stated) Lumbar forward flexion (65 degrees):  WNL Lumbar extension (30 degrees): 10d Lumbar lateral flexion (25 degrees): WNL bilat Thoracic and Lumbar rotation (30 degrees): approx  20d bilat Hip IR (0-45): 50% limited bilat Hip ER (0-45):WNL bilat Hip Flexion (0-125): WNL bilat Hip Abduction (0-40): WNL bilat Hip extension (0-15): 8d bilat Ankle DF R: -7d L: -10d *Indicates pain  PROM (degrees) PROM = AROM R/L (all movements include overpressure unless otherwise stated)  Repeated Movements No centralization or peripheralization of symptoms with repeated lumbar extension or flexion.    Muscle Length Hamstrings: approx 110d bilat E[place]: Positive bilat Thomas: Positive bilat Ober:  Positive bilat   Passive Accessory Intervertebral Motion (PAIVM) Pt denies reproduction of back pain with CPA L1-L5 and UPA bilaterally L1-L5. Generally hypomobile throughout  Passive Physiological Intervertebral Motion (PPIVM) Normal flexion and extension with PPIVM testing   SPECIAL TESTS Lumbar Radiculopathy and  Discogenic: Centralization and Peripheralization (SN 92, -LR 0.12): Negative Slump (SN 83, -LR 0.32): Negative SLR Negative bilat  Hip: FABER (SN 81): Positive bilat FADIR (SN 94): Positive bilat Hip scour (SN 50): Negative bilat  Functional Tasks Sit > Supine minA with HHA to mimic rail and minA at BLE, patient reports sometimes husband has to help her with this Supine> sit minA with patient reporting she does need help from her husband often for this, mostly assistance for trunk initiation  STS use of BUE at armrests (unable without) and LEs pressed against back of chair  5xSTS 1: 18.6sec 2: 18sec  6MWT 216ft SPC in RUE, 2 seated rest breaks (see gait notes)  10MWT 0.30m/s  Ther-Ex Demonstration from patient of the following therex with education on frequency, rep/set range, and hold with good verbalized understanding. Education on restoration of proper length/tension relationship to improve neutral hip/spine posture for optimal function.  Exercises [id number]  Modified Thomas Stretch - 65min hold - 2x daily - 7x weekly  Sit to Stand with Armchair - 5 reps - 3 sets - 1x daily - 7x weekly  Bridge - 10 reps - 3 sets - 1x daily - 7x weekly  PT educated patient on posture for carry over of muscle activation and lengthening with ambulation with good understanding   Cane adjustment to proper height       Objective measurements completed on examination: See above findings.              PT Education - 08/30/19 1427    Education Details  Patient was educated on diagnosis, anatomy and pathology involved, prognosis, role of PT, and was given an HEP, demonstrating exercise with proper form following verbal and tactile cues, and was given a paper hand out to continue exercise at home. Pt was educated on and agreed to plan of care.    Person(s) Educated  Patient    Methods  Explanation;Demonstration;Tactile cues;Verbal cues;Handout    Comprehension  Verbalized  understanding;Returned demonstration;Verbal cues required;Tactile cues required;Need further instruction       PT Short Term Goals - 08/30/19 1430      PT SHORT TERM GOAL #1   Title  Pt will be independent with HEP in order to improve strength and decrease back pain in order to improve pain-free function at home and work.    Baseline  08/30/19 HEP given    Time  4    Period  Weeks    Status  New  PT Long Term Goals - 08/30/19 1430      PT LONG TERM GOAL #1   Title  Patient will increase FOTO score to 51 demonstrate predicted increase in functional mobility to complete ADLs    Baseline  08/30/19 37    Time  12    Period  Weeks    Status  New      PT LONG TERM GOAL #2   Title  Pt will decrease worst back pain as reported on NPRS by at least 2 points in order to demonstrate clinically significant reduction in back pain.    Baseline  08/30/19 8/10    Time  6    Period  Weeks    Status  New      PT LONG TERM GOAL #3   Title  Pt will decrease 5TSTS by at least 3 seconds, without UE support, in order to demonstrate clinically significant improvement in LE strength    Baseline  08/30/19 18.3sec with armrests    Time  6    Period  Weeks    Status  New      PT LONG TERM GOAL #4   Title  Pt will increase 6MWT by at least 68m (1103ft) in order to demonstrate clinically significant improvement in cardiopulmonary endurance and community ambulation    Baseline  08/30/19 264ft SPC in RUE, 2 seated rest breaks (see gait notes)    Time  6    Period  Weeks    Status  New      PT LONG TERM GOAL #5   Title  Pt will increase 10MWT by at least 0.13 m/s in order to demonstrate clinically significant improvement in community ambulation.    Baseline  08/30/19 0.67m/s    Time  6    Period  Weeks    Status  New             Plan - 08/30/19 1550    Clinical Impression Statement  Patient is a 85 year old female presenting with chronic LBP that has been exacerbated over the past 6  months. Patient reports 6 months ago she started realized she was walking with a limp and started using a SPC and her endurance has declined since COVID began and she has been going less places; further exacerbating her LBP. Impairments in LBP, bilat ankle, hip, and trunk mobility, bilat hip and core strength, decreased endurance, and postural and gait deficits. Activity limitations in transfers (supine <> sit, and STS), walking, squatting/lifting, and standing inhibiting full participation in ind, safe ADLs.Would benefit from skilled PT to address above deficits and promote optimal return to PLOF.    Personal Factors and Comorbidities  Comorbidity 1;Age;Comorbidity 2;Fitness;Past/Current Experience;Time since onset of injury/illness/exacerbation;Behavior Pattern    Comorbidities  HTN; bilat THA    Examination-Activity Limitations  Bathing;Squat;Lift;Stairs;Locomotion Level;Carry;Transfers    Examination-Participation Restrictions  Church;Cleaning;Community Activity;Laundry    Stability/Clinical Decision Making  Evolving/Moderate complexity    Clinical Decision Making  Moderate    Rehab Potential  Good    PT Frequency  2x / week    PT Duration  8 weeks    PT Treatment/Interventions  ADLs/Self Care Home Management;Electrical Stimulation;Therapeutic activities;Patient/family education;Taping;Therapeutic exercise;DME Instruction;Aquatic Therapy;Moist Heat;Stair training;Traction;Ultrasound;Cryotherapy;Functional mobility training;Neuromuscular re-education;Manual techniques;Dry needling;Passive range of motion    PT Next Visit Plan  balance testing, stair training    PT Home Exercise Plan  [id number]    Consulted and Agree with Plan of Care  Patient  Patient will benefit from skilled therapeutic intervention in order to improve the following deficits and impairments:  Pain, Improper body mechanics, Increased fascial restricitons, Abnormal gait, Decreased mobility, Increased muscle spasms, Postural  dysfunction, Decreased activity tolerance, Decreased endurance, Decreased range of motion, Decreased strength, Decreased balance, Impaired flexibility, Difficulty walking  Visit Diagnosis: Chronic midline low back pain without sciatica  Other abnormalities of gait and mobility  History of falling     Problem List Patient Active Problem List   Diagnosis Date Noted  . Chronic low back pain 02/15/2019  . Difficulty walking 02/09/2019  . Acute pain of right shoulder 08/10/2018  . Foot drop 11/23/2017  . Status post shoulder replacement 11/20/2017  . Postmenopausal estrogen deficiency 10/15/2017  . Localized osteoarthritis of right shoulder 10/15/2017  . Difficulty sleeping 10/15/2017  . Acute cystitis without hematuria 07/20/2015  . Bilateral knee pain 06/23/2014  . Lumbar radiculopathy, chronic 06/23/2014  . Moderate aortic stenosis 11/03/2013  . Right bundle branch block 05/04/2013  . Hyperlipidemia 06/29/2012  . GERD (gastroesophageal reflux disease) 07/31/2011  . Hypertension 07/31/2011   Shelton Silvas PT, DPT Shelton Silvas 08/30/2019, 4:03 PM  [facility] [address], [place], [address] Phone: [phone]   Fax:  [phone]  Name: LANNAH MESSINEO MRN: [id number] Date of Birth: March 15, 1933

## 2019-09-01 ENCOUNTER — Ambulatory Visit: Payer: Medicare Other | Admitting: Physical Therapy

## 2019-09-01 ENCOUNTER — Encounter: Payer: Self-pay | Admitting: Physical Therapy

## 2019-09-01 ENCOUNTER — Other Ambulatory Visit: Payer: Self-pay

## 2019-09-01 DIAGNOSIS — Z9181 History of falling: Secondary | ICD-10-CM | POA: Diagnosis not present

## 2019-09-01 DIAGNOSIS — M545 Low back pain: Secondary | ICD-10-CM | POA: Diagnosis not present

## 2019-09-01 DIAGNOSIS — R2689 Other abnormalities of gait and mobility: Secondary | ICD-10-CM | POA: Diagnosis not present

## 2019-09-01 DIAGNOSIS — G8929 Other chronic pain: Secondary | ICD-10-CM

## 2019-09-01 NOTE — Therapy (Signed)
[place] [facility] [address], [place], [address] Phone: [phone]   Fax:  [phone]  Physical Therapy Treatment  Patient Details  Name: Robin Alexander MRN: [id number] Date of Birth: 02/23/1933 No data recorded  Encounter Date: 09/01/2019  PT End of Session - 09/01/19 1409    Visit Number  2    Number of Visits  17    Date for PT Re-Evaluation  10/26/19    PT Start Time  0147    PT Stop Time  0230    PT Time Calculation (min)  43 min    Activity Tolerance  Patient tolerated treatment well    Behavior During Therapy  [facility] for tasks assessed/performed       Past Medical History:  Diagnosis Date  . Abnormal CT scan, chest 03/20/2014  . Anemia   . Arthritis    shoulders  . Chicken pox   . Cold agglutinin disease   . Complication of anesthesia 2016   slow to wake up after anesthesia  . Dyspnea    uses inhaler when she gets bronchitis  . GERD (gastroesophageal reflux disease)   . GI bleed 03/16/2018  . Heart murmur   . High cholesterol   . HTN (hypertension)   . Multiple duodenal ulcers   . Pneumonia    spring 2015    Past Surgical History:  Procedure Laterality Date  . ABDOMINAL HYSTERECTOMY     in 50's  . ANTERIOR LAT LUMBAR FUSION Right 02/28/2015   Procedure: ANTERIOR LATERAL LUMBAR FUSION 1 LEVEL;  Surgeon: Phylliss Bob, MD;  Location: [facility];  Service: Orthopedics;  Laterality: Right;  Right sided lumbar 3-4 lateral interbody fusion  . APPENDECTOMY  1950  . BACK SURGERY     rod in back  . BICEPT TENODESIS Right 11/20/2017   Procedure: BICEPS TENODESIS;  Surgeon: Leim Fabry, MD;  Location: [facility];  Service: Orthopedics;  Laterality: Right;  . CATARACT EXTRACTION W/ INTRAOCULAR LENS  IMPLANT, BILATERAL Bilateral 2011  . CESAREAN SECTION     2  . COLONOSCOPY WITH PROPOFOL N/A 03/03/2018   Procedure: COLONOSCOPY WITH PROPOFOL;  Surgeon: Jonathon Bellows, MD;  Location: [facility];  Service:  Gastroenterology;  Laterality: N/A;  . ESOPHAGOGASTRODUODENOSCOPY (EGD) WITH PROPOFOL N/A 03/03/2018   Procedure: ESOPHAGOGASTRODUODENOSCOPY (EGD) WITH PROPOFOL;  Surgeon: Jonathon Bellows, MD;  Location: [facility];  Service: Gastroenterology;  Laterality: N/A;  . ESOPHAGOGASTRODUODENOSCOPY (EGD) WITH PROPOFOL N/A 04/26/2018   Procedure: ESOPHAGOGASTRODUODENOSCOPY (EGD) WITH PROPOFOL;  Surgeon: Jonathon Bellows, MD;  Location: [facility] END[facility]COPY;  Service: Gastroenterology;  Laterality: N/A;  . JOINT REPLACEMENT    . REVERSE SHOULDER ARTHROPLASTY Right 11/20/2017   Procedure: REVERSE SHOULDER ARTHROPLASTY;  Surgeon: Leim Fabry, MD;  Location: [facility];  Service: Orthopedics;  Laterality: Right;  . ROTATOR CUFF REPAIR Right    right shoulder   10 YRS  . TOTAL HIP ARTHROPLASTY Bilateral 2005, 2006   Bilateral, [place]    There were no vitals filed for this visit.  Subjective Assessment - 09/01/19 1351    Subjective  Patient reports she is unable to complete her hip flexor stretch (off edge of bed) d/t the way her bed is. Other than that, doing well, completing HEP.    Pertinent History  Patient is an 85 year old female presenting with chronic LBP. Patient reports 2 years ago she had a fall at church that started her LBP and bilat LE weakness.Patient had a fusion of L4-S1  2016. Has had injections for pain over the past 2 years and they "used to work" but are no longer helping her pain. Reports LBP is midline without redicular symptoms into LEs; denies numbness/tingling. Reports pain is sharp in the middle of middle of the spine following prolonged ambulation and standing. Rest helps her pain. Worst: 8/10 best: 0/10. Patient reports she started using SPC about 6 months ago when she noticed she had a "limp" when she walks. She lives with her husband who is physically independent, reports indpendence with ADLs, does have grab bars and shower seat in bathroom but does not use them. Has has 3 floors to her home,  but stays on 1 level. Does have stairs in/out of garage (6 steps) with bilat handrails that she reports no issues using. Patient has a rail on the side of her bed that she says assists her in supine<> sit transfers.  She can drives but prefers to have her husband. She enjoys working in her church and visit her friends, and wants to be able to feel comfortable walking without her cane to complete these community activies. Pt denies N/V, B&B changes, unexplained weight fluctuation, saddle paresthesia, fever, night sweats, or unrelenting night pain at this time.    Limitations  House hold activities;Lifting;Standing;Walking    How long can you sit comfortably?  unlimited    How long can you stand comfortably?  5-9mins    How long can you walk comfortably?  50mins    Diagnostic tests  MRI July 2020: Diffuse lumbar spine spondylosis as described above most severe at L2-3. At L2-3 there is a broad-based disc bulge. Moderate bilateral facet arthropathy. Moderate-severe spinal stenosis and bilateral ateral recess stenosis. Mild bilateral foraminal stenosis. CT September 2020 Moderate spinal stenosis at L2-3 as demonstrated on the priorMRI.    Patient Stated Goals  Walk without a cane    Pain Onset  More than a month ago          Ther-Ex Nustep L4 54mins with cuing to maintain SPM over 60, able to maintain fairly well - Stair hip flexor stretch 19min hold - STS without UE from in table x10 - Mini squat with pipe over head with mat table for TC 2x 10 - Standing hip abd 2x 10 bilat with heavy postural cuing and demonstration with decent carry over - Standing BUE flex to 90 holding yellow theraball rotations x10; with narrow BOS x10; on foam normal BOS; on foam narrow BOS x5 with increased time needed to establish balance on foam, post LOB with guarding and cuing for controlled sit - Seated alt marching 2x 10 with cuing for posture and core contractionwith good carry over                  PT  Education - 09/01/19 1407    Education Details  therex form/technique, HEP update    Person(s) Educated  Patient    Methods  Explanation;Demonstration;Verbal cues    Comprehension  Verbalized understanding;Returned demonstration;Verbal cues required       PT Short Term Goals - 08/30/19 1430      PT SHORT TERM GOAL #1   Title  Pt will be independent with HEP in order to improve strength and decrease back pain in order to improve pain-free function at home and work.    Baseline  08/30/19 HEP given    Time  4    Period  Weeks    Status  New  PT Long Term Goals - 08/30/19 1430      PT LONG TERM GOAL #1   Title  Patient will increase FOTO score to 51 demonstrate predicted increase in functional mobility to complete ADLs    Baseline  08/30/19 37    Time  12    Period  Weeks    Status  New      PT LONG TERM GOAL #2   Title  Pt will decrease worst back pain as reported on NPRS by at least 2 points in order to demonstrate clinically significant reduction in back pain.    Baseline  08/30/19 8/10    Time  6    Period  Weeks    Status  New      PT LONG TERM GOAL #3   Title  Pt will decrease 5TSTS by at least 3 seconds, without UE support, in order to demonstrate clinically significant improvement in LE strength    Baseline  08/30/19 18.3sec with armrests    Time  6    Period  Weeks    Status  New      PT LONG TERM GOAL #4   Title  Pt will increase 6MWT by at least 67m (133ft) in order to demonstrate clinically significant improvement in cardiopulmonary endurance and community ambulation    Baseline  08/30/19 233ft SPC in RUE, 2 seated rest breaks (see gait notes)    Time  6    Period  Weeks    Status  New      PT LONG TERM GOAL #5   Title  Pt will increase 10MWT by at least 0.13 m/s in order to demonstrate clinically significant improvement in community ambulation.    Baseline  08/30/19 0.79m/s    Time  6    Period  Weeks    Status  New            Plan -  09/01/19 1426    Clinical Impression Statement  PT led patient through therex for LE and core activation/strengthening and balance with good success. Patient requires cuing for proper technique, and gaurding for safety, but is able to complete all therex with proper form and is motivated throughout session. PT will continue progression as able.    Personal Factors and Comorbidities  Comorbidity 1;Age;Comorbidity 2;Fitness;Past/Current Experience;Time since onset of injury/illness/exacerbation;Behavior Pattern    Comorbidities  HTN; bilat THA    Examination-Activity Limitations  Bathing;Squat;Lift;Stairs;Locomotion Level;Carry;Transfers    Examination-Participation Restrictions  Church;Cleaning;Community Activity;Laundry    Stability/Clinical Decision Making  Evolving/Moderate complexity    Clinical Decision Making  Moderate    Rehab Potential  Good    PT Frequency  2x / week    PT Duration  8 weeks    PT Treatment/Interventions  ADLs/Self Care Home Management;Electrical Stimulation;Therapeutic activities;Patient/family education;Taping;Therapeutic exercise;DME Instruction;Aquatic Therapy;Moist Heat;Stair training;Traction;Ultrasound;Cryotherapy;Functional mobility training;Neuromuscular re-education;Manual techniques;Dry needling;Passive range of motion    PT Next Visit Plan  balance testing, stair training    PT Home Exercise Plan  [id number]    Consulted and Agree with Plan of Care  Patient       Patient will benefit from skilled therapeutic intervention in order to improve the following deficits and impairments:  Pain, Improper body mechanics, Increased fascial restricitons, Abnormal gait, Decreased mobility, Increased muscle spasms, Postural dysfunction, Decreased activity tolerance, Decreased endurance, Decreased range of motion, Decreased strength, Decreased balance, Impaired flexibility, Difficulty walking  Visit Diagnosis: Chronic midline low back pain without sciatica  Other  abnormalities of gait  and mobility  History of falling     Problem List Patient Active Problem List   Diagnosis Date Noted  . Chronic low back pain 02/15/2019  . Difficulty walking 02/09/2019  . Acute pain of right shoulder 08/10/2018  . Foot drop 11/23/2017  . Status post shoulder replacement 11/20/2017  . Postmenopausal estrogen deficiency 10/15/2017  . Localized osteoarthritis of right shoulder 10/15/2017  . Difficulty sleeping 10/15/2017  . Acute cystitis without hematuria 07/20/2015  . Bilateral knee pain 06/23/2014  . Lumbar radiculopathy, chronic 06/23/2014  . Moderate aortic stenosis 11/03/2013  . Right bundle branch block 05/04/2013  . Hyperlipidemia 06/29/2012  . GERD (gastroesophageal reflux disease) 07/31/2011  . Hypertension 07/31/2011   Shelton Silvas PT, DPT Shelton Silvas 09/01/2019, 2:35 PM  [place] [facility] [address] [place], [address] Phone: [phone]   Fax:  [phone]  Name: MURLEAN STRITE MRN: [id number] Date of Birth: 02-24-1933

## 2019-09-06 ENCOUNTER — Ambulatory Visit: Payer: PPO | Admitting: Physical Therapy

## 2019-09-07 ENCOUNTER — Encounter: Payer: Self-pay | Admitting: Physical Therapy

## 2019-09-07 ENCOUNTER — Ambulatory Visit: Payer: PPO | Attending: Family Medicine | Admitting: Physical Therapy

## 2019-09-07 ENCOUNTER — Other Ambulatory Visit: Payer: Self-pay

## 2019-09-07 DIAGNOSIS — G8929 Other chronic pain: Secondary | ICD-10-CM | POA: Diagnosis not present

## 2019-09-07 DIAGNOSIS — R2689 Other abnormalities of gait and mobility: Secondary | ICD-10-CM | POA: Insufficient documentation

## 2019-09-07 DIAGNOSIS — M545 Low back pain: Secondary | ICD-10-CM | POA: Diagnosis not present

## 2019-09-07 DIAGNOSIS — Z9181 History of falling: Secondary | ICD-10-CM | POA: Insufficient documentation

## 2019-09-07 NOTE — Therapy (Signed)
[place] [facility] [address], [place], [address] Phone: [phone]   Fax:  [phone]  Physical Therapy Treatment  Patient Details  Name: Robin Alexander MRN: [id number] Date of Birth: 1933-01-31 No data recorded  Encounter Date: 09/07/2019  PT End of Session - 09/07/19 1418    Visit Number  3    Number of Visits  17    Date for PT Re-Evaluation  10/26/19    PT Start Time  0145    PT Stop Time  0225    PT Time Calculation (min)  40 min    Activity Tolerance  Patient tolerated treatment well    Behavior During Therapy  [facility] for tasks assessed/performed       Past Medical History:  Diagnosis Date  . Abnormal CT scan, chest 03/20/2014  . Anemia   . Arthritis    shoulders  . Chicken pox   . Cold agglutinin disease   . Complication of anesthesia 2016   slow to wake up after anesthesia  . Dyspnea    uses inhaler when she gets bronchitis  . GERD (gastroesophageal reflux disease)   . GI bleed 03/16/2018  . Heart murmur   . High cholesterol   . HTN (hypertension)   . Multiple duodenal ulcers   . Pneumonia    spring 2015    Past Surgical History:  Procedure Laterality Date  . ABDOMINAL HYSTERECTOMY     in 50's  . ANTERIOR LAT LUMBAR FUSION Right 02/28/2015   Procedure: ANTERIOR LATERAL LUMBAR FUSION 1 LEVEL;  Surgeon: Phylliss Bob, MD;  Location: [facility];  Service: Orthopedics;  Laterality: Right;  Right sided lumbar 3-4 lateral interbody fusion  . APPENDECTOMY  1950  . BACK SURGERY     rod in back  . BICEPT TENODESIS Right 11/20/2017   Procedure: BICEPS TENODESIS;  Surgeon: Leim Fabry, MD;  Location: [facility];  Service: Orthopedics;  Laterality: Right;  . CATARACT EXTRACTION W/ INTRAOCULAR LENS  IMPLANT, BILATERAL Bilateral 2011  . CESAREAN SECTION     2  . COLONOSCOPY WITH PROPOFOL N/A 03/03/2018   Procedure: COLONOSCOPY WITH PROPOFOL;  Surgeon: Jonathon Bellows, MD;  Location: [facility] ENDOSCOPY;  Service:  Gastroenterology;  Laterality: N/A;  . ESOPHAGOGASTRODUODENOSCOPY (EGD) WITH PROPOFOL N/A 03/03/2018   Procedure: ESOPHAGOGASTRODUODENOSCOPY (EGD) WITH PROPOFOL;  Surgeon: Jonathon Bellows, MD;  Location: [facility] ENDOSCOPY;  Service: Gastroenterology;  Laterality: N/A;  . ESOPHAGOGASTRODUODENOSCOPY (EGD) WITH PROPOFOL N/A 04/26/2018   Procedure: ESOPHAGOGASTRODUODENOSCOPY (EGD) WITH PROPOFOL;  Surgeon: Jonathon Bellows, MD;  Location: [facility] ENDOSCOPY;  Service: Gastroenterology;  Laterality: N/A;  . JOINT REPLACEMENT    . REVERSE SHOULDER ARTHROPLASTY Right 11/20/2017   Procedure: REVERSE SHOULDER ARTHROPLASTY;  Surgeon: Leim Fabry, MD;  Location: [facility];  Service: Orthopedics;  Laterality: Right;  . ROTATOR CUFF REPAIR Right    right shoulder   10 YRS  . TOTAL HIP ARTHROPLASTY Bilateral 2005, 2006   Bilateral, [place]    There were no vitals filed for this visit.  Subjective Assessment - 09/07/19 1348    Subjective  Patient  reports no LBP, that legs continue to feel weak, but that she is very motivated with HEP.    Pertinent History  Patient is an 86 year old female presenting with chronic LBP. Patient reports 2 years ago she had a fall at church that started her LBP and bilat LE weakness.Patient had a fusion of L4-S1 2016. Has had injections for pain over the past  2 years and they "used to work" but are no longer helping her pain. Reports LBP is midline without redicular symptoms into LEs; denies numbness/tingling. Reports pain is sharp in the middle of middle of the spine following prolonged ambulation and standing. Rest helps her pain. Worst: 8/10 best: 0/10. Patient reports she started using SPC about 6 months ago when she noticed she had a "limp" when she walks. She lives with her husband who is physically independent, reports indpendence with ADLs, does have grab bars and shower seat in bathroom but does not use them. Has has 3 floors to her home, but stays on 1 level. Does have stairs in/out of  garage (6 steps) with bilat handrails that she reports no issues using. Patient has a rail on the side of her bed that she says assists her in supine<> sit transfers.  She can drives but prefers to have her husband. She enjoys working in her church and visit her friends, and wants to be able to feel comfortable walking without her cane to complete these community activies. Pt denies N/V, B&B changes, unexplained weight fluctuation, saddle paresthesia, fever, night sweats, or unrelenting night pain at this time.    Limitations  House hold activities;Lifting;Standing;Walking    How long can you sit comfortably?  unlimited    How long can you stand comfortably?  5-19mins    How long can you walk comfortably?  26mins    Diagnostic tests  MRI July 2020: Diffuse lumbar spine spondylosis as described above most severe at L2-3. At L2-3 there is a broad-based disc bulge. Moderate bilateral facet arthropathy. Moderate-severe spinal stenosis and bilateral ateral recess stenosis. Mild bilateral foraminal stenosis. CT September 2020 Moderate spinal stenosis at L2-3 as demonstrated on the priorMRI.    Pain Onset  More than a month ago       Ther-Ex Nustep L4 53mins with cuing to maintain SPM over 60, able to maintain fairly well - Step up onto 4in step x10 each LE; 7in 2x 10 each LE with cuing for full hip and knee ext with good carry over following, some continued cuing needed - Side stepping with BUE support for balance only RTB 3x 11ft (5 down and 5 back)  - Mini squat with pipe over head with mat table for TC 2x 10 - Bridge with hip abd with YTB 3x 5 with cuing to maintain bridge height with decent carry over, decreased height available d/t ROM restrictions - Prone hip ext 3x 8 with minimal height available (10d approx on L very minimal on R) cuing for glute activation with good carry over - Tandem walking 55ft x2 with CGA with occasional minA to prevent LOB - Stair hip flexor stretch 34min  hold                       PT Education - 09/07/19 1359    Education Details  therex form    Person(s) Educated  Patient    Methods  Explanation;Demonstration;Verbal cues    Comprehension  Verbalized understanding;Returned demonstration;Verbal cues required       PT Short Term Goals - 08/30/19 1430      PT SHORT TERM GOAL #1   Title  Pt will be independent with HEP in order to improve strength and decrease back pain in order to improve pain-free function at home and work.    Baseline  08/30/19 HEP given    Time  4    Period  Weeks  Status  New        PT Long Term Goals - 08/30/19 1430      PT LONG TERM GOAL #1   Title  Patient will increase FOTO score to 51 demonstrate predicted increase in functional mobility to complete ADLs    Baseline  08/30/19 37    Time  12    Period  Weeks    Status  New      PT LONG TERM GOAL #2   Title  Pt will decrease worst back pain as reported on NPRS by at least 2 points in order to demonstrate clinically significant reduction in back pain.    Baseline  08/30/19 8/10    Time  6    Period  Weeks    Status  New      PT LONG TERM GOAL #3   Title  Pt will decrease 5TSTS by at least 3 seconds, without UE support, in order to demonstrate clinically significant improvement in LE strength    Baseline  08/30/19 18.3sec with armrests    Time  6    Period  Weeks    Status  New      PT LONG TERM GOAL #4   Title  Pt will increase 6MWT by at least 18m (190ft) in order to demonstrate clinically significant improvement in cardiopulmonary endurance and community ambulation    Baseline  08/30/19 226ft SPC in RUE, 2 seated rest breaks (see gait notes)    Time  6    Period  Weeks    Status  New      PT LONG TERM GOAL #5   Title  Pt will increase 10MWT by at least 0.13 m/s in order to demonstrate clinically significant improvement in community ambulation.    Baseline  08/30/19 0.68m/s    Time  6    Period  Weeks    Status  New             Plan - 09/07/19 1425    Clinical Impression Statement  PT continued therex progression for increased hip mobility and strengthening with balance training. Patient requires gaurding for safety with occassional minA to prevent LOB with high level balance activity, and cuing for technique with good carry over following. Patient motivated throughout session with good carry over of all cuing. PT will continue progression as able.    Personal Factors and Comorbidities  Comorbidity 1;Age;Comorbidity 2;Fitness;Past/Current Experience;Time since onset of injury/illness/exacerbation;Behavior Pattern    Comorbidities  HTN; bilat THA    Examination-Activity Limitations  Bathing;Squat;Lift;Stairs;Locomotion Level;Carry;Transfers    Examination-Participation Restrictions  Church;Cleaning;Community Activity;Laundry    Stability/Clinical Decision Making  Evolving/Moderate complexity    Clinical Decision Making  Moderate    Rehab Potential  Good    PT Frequency  2x / week    PT Duration  8 weeks    PT Treatment/Interventions  ADLs/Self Care Home Management;Electrical Stimulation;Therapeutic activities;Patient/family education;Taping;Therapeutic exercise;DME Instruction;Aquatic Therapy;Moist Heat;Stair training;Traction;Ultrasound;Cryotherapy;Functional mobility training;Neuromuscular re-education;Manual techniques;Dry needling;Passive range of motion    PT Next Visit Plan  balance testing, stair training    PT Home Exercise Plan  [id number]    Consulted and Agree with Plan of Care  Patient       Patient will benefit from skilled therapeutic intervention in order to improve the following deficits and impairments:  Pain, Improper body mechanics, Increased fascial restricitons, Abnormal gait, Decreased mobility, Increased muscle spasms, Postural dysfunction, Decreased activity tolerance, Decreased endurance, Decreased range of motion, Decreased strength, Decreased balance, Impaired  flexibility,  Difficulty walking  Visit Diagnosis: Chronic midline low back pain without sciatica  Other abnormalities of gait and mobility  History of falling     Problem List Patient Active Problem List   Diagnosis Date Noted  . Chronic low back pain 02/15/2019  . Difficulty walking 02/09/2019  . Acute pain of right shoulder 08/10/2018  . Foot drop 11/23/2017  . Status post shoulder replacement 11/20/2017  . Postmenopausal estrogen deficiency 10/15/2017  . Localized osteoarthritis of right shoulder 10/15/2017  . Difficulty sleeping 10/15/2017  . Acute cystitis without hematuria 07/20/2015  . Bilateral knee pain 06/23/2014  . Lumbar radiculopathy, chronic 06/23/2014  . Moderate aortic stenosis 11/03/2013  . Right bundle branch block 05/04/2013  . Hyperlipidemia 06/29/2012  . GERD (gastroesophageal reflux disease) 07/31/2011  . Hypertension 07/31/2011   Shelton Silvas PT, DPT Shelton Silvas 09/07/2019, 2:29 PM  [place] [facility] [address], [place], [address] Phone: [phone]   Fax:  [phone]  Name: Robin Alexander MRN: [id number] Date of Birth: 1932/11/20

## 2019-09-08 ENCOUNTER — Ambulatory Visit: Payer: PPO | Admitting: Physical Therapy

## 2019-09-08 ENCOUNTER — Telehealth: Payer: Self-pay

## 2019-09-08 DIAGNOSIS — R809 Proteinuria, unspecified: Secondary | ICD-10-CM

## 2019-09-08 NOTE — Telephone Encounter (Signed)
-----   Message from Leone Haven, MD sent at 08/25/2019 10:02 AM EST ----- Please let the patient know that her urine did not reveal any infection.  There was some protein on her urine dipstick and I would like to complete further urine studies on that to help determine the level of protein in her urine.  It appears she just had a Covid test so she will have to have her urine studies completed when she is able to come back in the office.  Please place a future order for a urine protein creatinine ratio for a diagnosis of proteinuria.

## 2019-09-13 ENCOUNTER — Ambulatory Visit: Payer: PPO | Admitting: Physical Therapy

## 2019-09-14 ENCOUNTER — Other Ambulatory Visit: Payer: Self-pay

## 2019-09-14 ENCOUNTER — Other Ambulatory Visit (INDEPENDENT_AMBULATORY_CARE_PROVIDER_SITE_OTHER): Payer: PPO

## 2019-09-14 DIAGNOSIS — R809 Proteinuria, unspecified: Secondary | ICD-10-CM | POA: Diagnosis not present

## 2019-09-14 LAB — MICROALBUMIN / CREATININE URINE RATIO
Creatinine,U: 141.1 mg/dL
Microalb Creat Ratio: 18.2 mg/g (ref 0.0–30.0)
Microalb, Ur: 25.7 mg/dL — ABNORMAL HIGH (ref 0.0–1.9)

## 2019-09-15 ENCOUNTER — Ambulatory Visit: Payer: PPO | Admitting: Physical Therapy

## 2019-09-15 ENCOUNTER — Encounter: Payer: Self-pay | Admitting: Physical Therapy

## 2019-09-15 DIAGNOSIS — Z9181 History of falling: Secondary | ICD-10-CM

## 2019-09-15 DIAGNOSIS — R2689 Other abnormalities of gait and mobility: Secondary | ICD-10-CM

## 2019-09-15 DIAGNOSIS — G8929 Other chronic pain: Secondary | ICD-10-CM

## 2019-09-15 DIAGNOSIS — M545 Low back pain: Secondary | ICD-10-CM | POA: Diagnosis not present

## 2019-09-15 NOTE — Therapy (Signed)
[place] [facility] [address] [place], [address] Phone: [phone]   Fax:  [phone]  Physical Therapy Treatment  Patient Details  Name: Robin Alexander MRN: [id number] Date of Birth: [birth year] No data recorded  Encounter Date: 09/15/2019  PT End of Session - 09/15/19 1340    Visit Number  4    Number of Visits  17    Date for PT Re-Evaluation  10/26/19    PT Start Time  0132    PT Stop Time  0210    PT Time Calculation (min)  38 min    Activity Tolerance  Patient tolerated treatment well    Behavior During Therapy  [facility] for tasks assessed/performed       Past Medical History:  Diagnosis Date  . Abnormal CT scan, chest 03/20/2014  . Anemia   . Arthritis    shoulders  . Chicken pox   . Cold agglutinin disease   . Complication of anesthesia 2016   slow to wake up after anesthesia  . Dyspnea    uses inhaler when she gets bronchitis  . GERD (gastroesophageal reflux disease)   . GI bleed 03/16/2018  . Heart murmur   . High cholesterol   . HTN (hypertension)   . Multiple duodenal ulcers   . Pneumonia    spring 2015    Past Surgical History:  Procedure Laterality Date  . ABDOMINAL HYSTERECTOMY     in 50's  . ANTERIOR LAT LUMBAR FUSION Right 02/28/2015   Procedure: ANTERIOR LATERAL LUMBAR FUSION 1 LEVEL;  Surgeon: Phylliss Bob, MD;  Location: [place];  Service: Orthopedics;  Laterality: Right;  Right sided lumbar 3-4 lateral interbody fusion  . APPENDECTOMY  1950  . BACK SURGERY     rod in back  . BICEPT TENODESIS Right 11/20/2017   Procedure: BICEPS TENODESIS;  Surgeon: Leim Fabry, MD;  Location: [facility];  Service: Orthopedics;  Laterality: Right;  . CATARACT EXTRACTION W/ INTRAOCULAR LENS  IMPLANT, BILATERAL Bilateral 2011  . CESAREAN SECTION     2  . COLONOSCOPY WITH PROPOFOL N/A 03/03/2018   Procedure: COLONOSCOPY WITH PROPOFOL;  Surgeon: Jonathon Bellows, MD;  Location: [facility];  Service:  Gastroenterology;  Laterality: N/A;  . ESOPHAGOGASTRODUODENOSCOPY (EGD) WITH PROPOFOL N/A 03/03/2018   Procedure: ESOPHAGOGASTRODUODENOSCOPY (EGD) WITH PROPOFOL;  Surgeon: Jonathon Bellows, MD;  Location: [facility] ENDOSCOPY;  Service: Gastroenterology;  Laterality: N/A;  . ESOPHAGOGASTRODUODENOSCOPY (EGD) WITH PROPOFOL N/A 04/26/2018   Procedure: ESOPHAGOGASTRODUODENOSCOPY (EGD) WITH PROPOFOL;  Surgeon: Jonathon Bellows, MD;  Location: [facility]COPY;  Service: Gastroenterology;  Laterality: N/A;  . JOINT REPLACEMENT    . REVERSE SHOULDER ARTHROPLASTY Right 11/20/2017   Procedure: REVERSE SHOULDER ARTHROPLASTY;  Surgeon: Leim Fabry, MD;  Location: [facility];  Service: Orthopedics;  Laterality: Right;  . ROTATOR CUFF REPAIR Right    right shoulder   10 YRS  . TOTAL HIP ARTHROPLASTY Bilateral 2005, 2006   Bilateral, [place]    There were no vitals filed for this visit.  Subjective Assessment - 09/15/19 1336    Subjective  Patient reports she has been using a pain patch for her LBP, which has been helpful. Patinet reports she would like some more LE strengthening exercises on her HEP.    Pertinent History  Patient is an 86 year old female presenting with chronic LBP. Patient reports 2 years ago she had a fall at church that started her LBP and bilat LE weakness.Patient had a fusion of  L4-S1 2016. Has had injections for pain over the past 2 years and they "used to work" but are no longer helping her pain. Reports LBP is midline without redicular symptoms into LEs; denies numbness/tingling. Reports pain is sharp in the middle of middle of the spine following prolonged ambulation and standing. Rest helps her pain. Worst: 8/10 best: 0/10. Patient reports she started using SPC about 6 months ago when she noticed she had a "limp" when she walks. She lives with her husband who is physically independent, reports indpendence with ADLs, does have grab bars and shower seat in bathroom but does not use them. Has has 3 floors  to her home, but stays on 1 level. Does have stairs in/out of garage (6 steps) with bilat handrails that she reports no issues using. Patient has a rail on the side of her bed that she says assists her in supine<> sit transfers.  She can drives but prefers to have her husband. She enjoys working in her church and visit her friends, and wants to be able to feel comfortable walking without her cane to complete these community activies. Pt denies N/V, B&B changes, unexplained weight fluctuation, saddle paresthesia, fever, night sweats, or unrelenting night pain at this time.    Limitations  House hold activities;Lifting;Standing;Walking    How long can you sit comfortably?  unlimited    How long can you stand comfortably?  5-72mins    How long can you walk comfortably?  36mins    Diagnostic tests  MRI July 2020: Diffuse lumbar spine spondylosis as described above most severe at L2-3. At L2-3 there is a broad-based disc bulge. Moderate bilateral facet arthropathy. Moderate-severe spinal stenosis and bilateral ateral recess stenosis. Mild bilateral foraminal stenosis. CT September 2020 Moderate spinal stenosis at L2-3 as demonstrated on the priorMRI.    Patient Stated Goals  Walk without a cane      Ther-Ex Nustep seat setting 4, UE 8; L4 54mins with cuing to maintain SPM over 60, able to maintain fairly well - Mini squat at treadmill bar 2x 10 with heavy cuing initially for full hip ext with stand with good carry over - Standing hip abd x10 bilat; with RTB at ankles x10 bilat with cuing to maintain hip ext with good carry over following - Standing heel raise x10; SL heel raise x10 bilat with cuing to prevent "trunk rock" with good carry over - Reverse mini lunge to hip flex 3x 8 bilat with demo and heavy cuing initially with good carry over following for proper technique HEP update to include this sessions therex on same code, good understanding  Gait Training Over 211ft total: amb with horizontal head  turns with some mild change in speed, horizontal head turns with good speed maintenance, fast/slow speed changes, 5 cone negotiation and 5 6in hurdles. Overall very good balance with all dynamic balance tasks, with CGA for safety. Some instances of hitting foot on hurdle, but patient able to continue forward propulsion without stepping strategy.                          PT Education - 09/15/19 1339    Education Details  therex form, HEP update    Person(s) Educated  Patient    Methods  Explanation;Demonstration;Tactile cues;Handout    Comprehension  Verbalized understanding;Returned demonstration;Verbal cues required       PT Short Term Goals - 08/30/19 1430      PT SHORT TERM GOAL #  1   Title  Pt will be independent with HEP in order to improve strength and decrease back pain in order to improve pain-free function at home and work.    Baseline  08/30/19 HEP given    Time  4    Period  Weeks    Status  New        PT Long Term Goals - 08/30/19 1430      PT LONG TERM GOAL #1   Title  Patient will increase FOTO score to 51 demonstrate predicted increase in functional mobility to complete ADLs    Baseline  08/30/19 37    Time  12    Period  Weeks    Status  New      PT LONG TERM GOAL #2   Title  Pt will decrease worst back pain as reported on NPRS by at least 2 points in order to demonstrate clinically significant reduction in back pain.    Baseline  08/30/19 8/10    Time  6    Period  Weeks    Status  New      PT LONG TERM GOAL #3   Title  Pt will decrease 5TSTS by at least 3 seconds, without UE support, in order to demonstrate clinically significant improvement in LE strength    Baseline  08/30/19 18.3sec with armrests    Time  6    Period  Weeks    Status  New      PT LONG TERM GOAL #4   Title  Pt will increase 6MWT by at least 2m (163ft) in order to demonstrate clinically significant improvement in cardiopulmonary endurance and community ambulation     Baseline  08/30/19 292ft SPC in RUE, 2 seated rest breaks (see gait notes)    Time  6    Period  Weeks    Status  New      PT LONG TERM GOAL #5   Title  Pt will increase 10MWT by at least 0.13 m/s in order to demonstrate clinically significant improvement in community ambulation.    Baseline  08/30/19 0.42m/s    Time  6    Period  Weeks    Status  New            Plan - 09/15/19 1448    Clinical Impression Statement  PT continued therex progression for LE strengthening, with progression toward dynamic balance tasks, which patient is able to complete with CGA for safety and complywith all cuing for technique and to maintain balance. HEP updated this session per patient request to progress strengthening. PT will continue progression as able.    Personal Factors and Comorbidities  Comorbidity 1;Age;Comorbidity 2;Fitness;Past/Current Experience;Time since onset of injury/illness/exacerbation;Behavior Pattern    Comorbidities  HTN; bilat THA    Examination-Activity Limitations  Bathing;Squat;Lift;Stairs;Locomotion Level;Carry;Transfers    Examination-Participation Restrictions  Church;Cleaning;Community Activity;Laundry    Stability/Clinical Decision Making  Evolving/Moderate complexity    Clinical Decision Making  Moderate    Rehab Potential  Good    PT Frequency  2x / week    PT Duration  8 weeks    PT Treatment/Interventions  ADLs/Self Care Home Management;Electrical Stimulation;Therapeutic activities;Patient/family education;Taping;Therapeutic exercise;DME Instruction;Aquatic Therapy;Moist Heat;Stair training;Traction;Ultrasound;Cryotherapy;Functional mobility training;Neuromuscular re-education;Manual techniques;Dry needling;Passive range of motion    PT Next Visit Plan  balance testing, stair training    PT Home Exercise Plan  [id number]    Consulted and Agree with Plan of Care  Patient  Patient will benefit from skilled therapeutic intervention in order to improve the  following deficits and impairments:  Pain, Improper body mechanics, Increased fascial restricitons, Abnormal gait, Decreased mobility, Increased muscle spasms, Postural dysfunction, Decreased activity tolerance, Decreased endurance, Decreased range of motion, Decreased strength, Decreased balance, Impaired flexibility, Difficulty walking  Visit Diagnosis: Chronic midline low back pain without sciatica  Other abnormalities of gait and mobility  History of falling     Problem List Patient Active Problem List   Diagnosis Date Noted  . Chronic low back pain 02/15/2019  . Difficulty walking 02/09/2019  . Acute pain of right shoulder 08/10/2018  . Foot drop 11/23/2017  . Status post shoulder replacement 11/20/2017  . Postmenopausal estrogen deficiency 10/15/2017  . Localized osteoarthritis of right shoulder 10/15/2017  . Difficulty sleeping 10/15/2017  . Acute cystitis without hematuria 07/20/2015  . Bilateral knee pain 06/23/2014  . Lumbar radiculopathy, chronic 06/23/2014  . Moderate aortic stenosis 11/03/2013  . Right bundle branch block 05/04/2013  . Hyperlipidemia 06/29/2012  . GERD (gastroesophageal reflux disease) 07/31/2011  . Hypertension 07/31/2011   Shelton Silvas PT, DPT Shelton Silvas 09/15/2019, 2:51 PM  [place] [facility] [address], [place], [address] Phone: [phone]   Fax:  [phone]  Name: Robin Alexander MRN: [id number] Date of Birth: [birth year]

## 2019-09-16 DIAGNOSIS — M5416 Radiculopathy, lumbar region: Secondary | ICD-10-CM | POA: Diagnosis not present

## 2019-09-16 DIAGNOSIS — M48062 Spinal stenosis, lumbar region with neurogenic claudication: Secondary | ICD-10-CM | POA: Diagnosis not present

## 2019-09-16 DIAGNOSIS — M5136 Other intervertebral disc degeneration, lumbar region: Secondary | ICD-10-CM | POA: Diagnosis not present

## 2019-09-20 ENCOUNTER — Ambulatory Visit: Payer: PPO | Admitting: Physical Therapy

## 2019-09-21 ENCOUNTER — Encounter: Payer: Self-pay | Admitting: Physical Therapy

## 2019-09-21 ENCOUNTER — Other Ambulatory Visit: Payer: Self-pay

## 2019-09-21 ENCOUNTER — Ambulatory Visit: Payer: PPO | Admitting: Physical Therapy

## 2019-09-21 DIAGNOSIS — R2689 Other abnormalities of gait and mobility: Secondary | ICD-10-CM

## 2019-09-21 DIAGNOSIS — M545 Low back pain, unspecified: Secondary | ICD-10-CM

## 2019-09-21 DIAGNOSIS — G8929 Other chronic pain: Secondary | ICD-10-CM

## 2019-09-21 DIAGNOSIS — Z9181 History of falling: Secondary | ICD-10-CM

## 2019-09-21 NOTE — Therapy (Signed)
[place] [facility] [address] [place], [address] Phone: [phone]   Fax:  [phone]  Physical Therapy Treatment  Patient Details  Name: Robin Alexander MRN: [id number] Date of Birth: 06/21/1933 No data recorded  Encounter Date: 09/21/2019  PT End of Session - 09/21/19 1008    Visit Number  5    Number of Visits  17    Date for PT Re-Evaluation  10/26/19    PT Start Time  [id number]    PT Stop Time  1045    PT Time Calculation (min)  43 min    Activity Tolerance  Patient tolerated treatment well    Behavior During Therapy  [facility] for tasks assessed/performed       Past Medical History:  Diagnosis Date  . Abnormal CT scan, chest 03/20/2014  . Anemia   . Arthritis    shoulders  . Chicken pox   . Cold agglutinin disease   . Complication of anesthesia 2016   slow to wake up after anesthesia  . Dyspnea    uses inhaler when she gets bronchitis  . GERD (gastroesophageal reflux disease)   . GI bleed 03/16/2018  . Heart murmur   . High cholesterol   . HTN (hypertension)   . Multiple duodenal ulcers   . Pneumonia    spring 2015    Past Surgical History:  Procedure Laterality Date  . ABDOMINAL HYSTERECTOMY     in 50's  . ANTERIOR LAT LUMBAR FUSION Right 02/28/2015   Procedure: ANTERIOR LATERAL LUMBAR FUSION 1 LEVEL;  Surgeon: Phylliss Bob, MD;  Location: [place];  Service: Orthopedics;  Laterality: Right;  Right sided lumbar 3-4 lateral interbody fusion  . APPENDECTOMY  1950  . BACK SURGERY     rod in back  . BICEPT TENODESIS Right 11/20/2017   Procedure: BICEPS TENODESIS;  Surgeon: Leim Fabry, MD;  Location: [facility];  Service: Orthopedics;  Laterality: Right;  . CATARACT EXTRACTION W/ INTRAOCULAR LENS  IMPLANT, BILATERAL Bilateral 2011  . CESAREAN SECTION     2  . COLONOSCOPY WITH PROPOFOL N/A 03/03/2018   Procedure: COLONOSCOPY WITH PROPOFOL;  Surgeon: Jonathon Bellows, MD;  Location: [facility]COPY;  Service:  Gastroenterology;  Laterality: N/A;  . ESOPHAGOGASTRODUODENOSCOPY (EGD) WITH PROPOFOL N/A 03/03/2018   Procedure: ESOPHAGOGASTRODUODENOSCOPY (EGD) WITH PROPOFOL;  Surgeon: Jonathon Bellows, MD;  Location: [facility] ENDOSCOPY;  Service: Gastroenterology;  Laterality: N/A;  . ESOPHAGOGASTRODUODENOSCOPY (EGD) WITH PROPOFOL N/A 04/26/2018   Procedure: ESOPHAGOGASTRODUODENOSCOPY (EGD) WITH PROPOFOL;  Surgeon: Jonathon Bellows, MD;  Location: [facility]COPY;  Service: Gastroenterology;  Laterality: N/A;  . JOINT REPLACEMENT    . REVERSE SHOULDER ARTHROPLASTY Right 11/20/2017   Procedure: REVERSE SHOULDER ARTHROPLASTY;  Surgeon: Leim Fabry, MD;  Location: [facility];  Service: Orthopedics;  Laterality: Right;  . ROTATOR CUFF REPAIR Right    right shoulder   10 YRS  . TOTAL HIP ARTHROPLASTY Bilateral 2005, 2006   Bilateral, [place]    There were no vitals filed for this visit.  Subjective Assessment - 09/21/19 1006    Subjective  Patient reports no pain this am, and no falls since last visit. Reports she is completing HEP without pain patch.    Pertinent History  Patient is an 86 year old female presenting with chronic LBP. Patient reports 2 years ago she had a fall at church that started her LBP and bilat LE weakness.Patient had a fusion of L4-S1 2016. Has had injections for pain over the  past 2 years and they "used to work" but are no longer helping her pain. Reports LBP is midline without redicular symptoms into LEs; denies numbness/tingling. Reports pain is sharp in the middle of middle of the spine following prolonged ambulation and standing. Rest helps her pain. Worst: 8/10 best: 0/10. Patient reports she started using SPC about 6 months ago when she noticed she had a "limp" when she walks. She lives with her husband who is physically independent, reports indpendence with ADLs, does have grab bars and shower seat in bathroom but does not use them. Has has 3 floors to her home, but stays on 1 level. Does have stairs  in/out of garage (6 steps) with bilat handrails that she reports no issues using. Patient has a rail on the side of her bed that she says assists her in supine<> sit transfers.  She can drives but prefers to have her husband. She enjoys working in her church and visit her friends, and wants to be able to feel comfortable walking without her cane to complete these community activies. Pt denies N/V, B&B changes, unexplained weight fluctuation, saddle paresthesia, fever, night sweats, or unrelenting night pain at this time.    Limitations  House hold activities;Lifting;Standing;Walking    How long can you sit comfortably?  unlimited    How long can you stand comfortably?  5-13mins    How long can you walk comfortably?  41mins    Diagnostic tests  MRI July 2020: Diffuse lumbar spine spondylosis as described above most severe at L2-3. At L2-3 there is a broad-based disc bulge. Moderate bilateral facet arthropathy. Moderate-severe spinal stenosis and bilateral ateral recess stenosis. Mild bilateral foraminal stenosis. CT September 2020 Moderate spinal stenosis at L2-3 as demonstrated on the priorMRI.    Patient Stated Goals  Walk without a cane        Ther-Ex Nustep seat setting 4, UE 8; L4 32mins with cuing to maintain SPM over 60, able to maintain fairly well - Side stepping over 6in hurdle 2x 8 each direction with cuing for large step and DF on RLE to clear foot with good carry over - Standing on foam drawing large alphabet with bilat UE with cuing for trunk rotation  - Box heel taps 6in box 2x 10 each LE with cuing for R foot clearance from box for increased DF and hip flex with good carry over - Slider abd in mini squat 2x 10 bilat with min cuing to maintain mini squat and eccentric control with good carry over - Reverse mini lunge to hip flex 3x 8 bilat with demo and heavy cuing initially with good carry over following for proper technique                           PT  Education - 09/21/19 1007    Education Details  therex form    Person(s) Educated  Patient    Methods  Explanation;Demonstration;Verbal cues    Comprehension  Verbalized understanding;Returned demonstration;Verbal cues required       PT Short Term Goals - 08/30/19 1430      PT SHORT TERM GOAL #1   Title  Pt will be independent with HEP in order to improve strength and decrease back pain in order to improve pain-free function at home and work.    Baseline  08/30/19 HEP given    Time  4    Period  Weeks    Status  New  PT Long Term Goals - 08/30/19 1430      PT LONG TERM GOAL #1   Title  Patient will increase FOTO score to 51 demonstrate predicted increase in functional mobility to complete ADLs    Baseline  08/30/19 37    Time  12    Period  Weeks    Status  New      PT LONG TERM GOAL #2   Title  Pt will decrease worst back pain as reported on NPRS by at least 2 points in order to demonstrate clinically significant reduction in back pain.    Baseline  08/30/19 8/10    Time  6    Period  Weeks    Status  New      PT LONG TERM GOAL #3   Title  Pt will decrease 5TSTS by at least 3 seconds, without UE support, in order to demonstrate clinically significant improvement in LE strength    Baseline  08/30/19 18.3sec with armrests    Time  6    Period  Weeks    Status  New      PT LONG TERM GOAL #4   Title  Pt will increase 6MWT by at least 58m (16ft) in order to demonstrate clinically significant improvement in cardiopulmonary endurance and community ambulation    Baseline  08/30/19 244ft SPC in RUE, 2 seated rest breaks (see gait notes)    Time  6    Period  Weeks    Status  New      PT LONG TERM GOAL #5   Title  Pt will increase 10MWT by at least 0.13 m/s in order to demonstrate clinically significant improvement in community ambulation.    Baseline  08/30/19 0.1m/s    Time  6    Period  Weeks    Status  New            Plan - 09/21/19 1036    Clinical  Impression Statement  PT continued therex progression with dynamic balance tasks for LE strenghtening. Patient is able to complete all therex with proper technique following multi-modal cuing. Patient motivated throughout session with good understanding of all provided corrections. PT will continue progression as able.    Personal Factors and Comorbidities  Comorbidity 1;Age;Comorbidity 2;Fitness;Past/Current Experience;Time since onset of injury/illness/exacerbation;Behavior Pattern    Comorbidities  HTN; bilat THA    Examination-Activity Limitations  Bathing;Squat;Lift;Stairs;Locomotion Level;Carry;Transfers    Examination-Participation Restrictions  Church;Cleaning;Community Activity;Laundry    Stability/Clinical Decision Making  Evolving/Moderate complexity    Clinical Decision Making  Moderate    Rehab Potential  Good    PT Frequency  2x / week    PT Duration  8 weeks    PT Treatment/Interventions  ADLs/Self Care Home Management;Electrical Stimulation;Therapeutic activities;Patient/family education;Taping;Therapeutic exercise;DME Instruction;Aquatic Therapy;Moist Heat;Stair training;Traction;Ultrasound;Cryotherapy;Functional mobility training;Neuromuscular re-education;Manual techniques;Dry needling;Passive range of motion    PT Next Visit Plan  dynamic balance, hip/core strengthening    PT Home Exercise Plan  [id number]    Consulted and Agree with Plan of Care  Patient       Patient will benefit from skilled therapeutic intervention in order to improve the following deficits and impairments:  Pain, Improper body mechanics, Increased fascial restricitons, Abnormal gait, Decreased mobility, Increased muscle spasms, Postural dysfunction, Decreased activity tolerance, Decreased endurance, Decreased range of motion, Decreased strength, Decreased balance, Impaired flexibility, Difficulty walking  Visit Diagnosis: Chronic midline low back pain without sciatica  Other abnormalities of gait and  mobility  History of  falling     Problem List Patient Active Problem List   Diagnosis Date Noted  . Chronic low back pain 02/15/2019  . Difficulty walking 02/09/2019  . Acute pain of right shoulder 08/10/2018  . Foot drop 11/23/2017  . Status post shoulder replacement 11/20/2017  . Postmenopausal estrogen deficiency 10/15/2017  . Localized osteoarthritis of right shoulder 10/15/2017  . Difficulty sleeping 10/15/2017  . Acute cystitis without hematuria 07/20/2015  . Bilateral knee pain 06/23/2014  . Lumbar radiculopathy, chronic 06/23/2014  . Moderate aortic stenosis 11/03/2013  . Right bundle branch block 05/04/2013  . Hyperlipidemia 06/29/2012  . GERD (gastroesophageal reflux disease) 07/31/2011  . Hypertension 07/31/2011   Shelton Silvas PT, DPT Shelton Silvas 09/21/2019, 10:40 AM  [place] [facility] [address], [place], [address] Phone: [phone]   Fax:  [phone]  Name: Robin Alexander MRN: [id number] Date of Birth: 27-May-1933

## 2019-09-22 ENCOUNTER — Telehealth: Payer: Self-pay | Admitting: Family Medicine

## 2019-09-22 ENCOUNTER — Ambulatory Visit: Payer: PPO | Admitting: Physical Therapy

## 2019-09-22 ENCOUNTER — Encounter: Payer: Self-pay | Admitting: Physical Therapy

## 2019-09-22 DIAGNOSIS — Z9181 History of falling: Secondary | ICD-10-CM

## 2019-09-22 DIAGNOSIS — M545 Low back pain: Secondary | ICD-10-CM | POA: Diagnosis not present

## 2019-09-22 DIAGNOSIS — R2689 Other abnormalities of gait and mobility: Secondary | ICD-10-CM

## 2019-09-22 DIAGNOSIS — G8929 Other chronic pain: Secondary | ICD-10-CM

## 2019-09-22 NOTE — Telephone Encounter (Signed)
Pt needs a refill on amLODipine-benazepril (LOTREL) 5-20 MG capsule. [facility]

## 2019-09-22 NOTE — Therapy (Addendum)
[facility] [address], [place], [address] Phone: [phone]   Fax:  [phone]  Physical Therapy Treatment  Patient Details  Name: Robin Alexander MRN: [id number] Date of Birth: Jun 02, 1933 No data recorded  Encounter Date: 09/22/2019  PT End of Session - 09/22/19 1735    Visit Number  6    Number of Visits  17    Date for PT Re-Evaluation  10/26/19    PT Start Time  1600    PT Stop Time  1645    PT Time Calculation (min)  45 min    Equipment Utilized During Treatment  Gait belt    Activity Tolerance  Patient tolerated treatment well    Behavior During Therapy  WFL for tasks assessed/performed       Past Medical History:  Diagnosis Date  . Abnormal CT scan, chest 03/20/2014  . Anemia   . Arthritis    shoulders  . Chicken pox   . Cold agglutinin disease   . Complication of anesthesia 2016   slow to wake up after anesthesia  . Dyspnea    uses inhaler when she gets bronchitis  . GERD (gastroesophageal reflux disease)   . GI bleed 03/16/2018  . Heart murmur   . High cholesterol   . HTN (hypertension)   . Multiple duodenal ulcers   . Pneumonia    spring 2015    Past Surgical History:  Procedure Laterality Date  . ABDOMINAL HYSTERECTOMY     in 50's  . ANTERIOR LAT LUMBAR FUSION Right 02/28/2015   Procedure: ANTERIOR LATERAL LUMBAR FUSION 1 LEVEL;  Surgeon: Phylliss Bob, MD;  Location: [facility];  Service: Orthopedics;  Laterality: Right;  Right sided lumbar 3-4 lateral interbody fusion  . APPENDECTOMY  1950  . BACK SURGERY     rod in back  . BICEPT TENODESIS Right 11/20/2017   Procedure: BICEPS TENODESIS;  Surgeon: Leim Fabry, MD;  Location: [facility];  Service: Orthopedics;  Laterality: Right;  . CATARACT EXTRACTION W/ INTRAOCULAR LENS  IMPLANT, BILATERAL Bilateral 2011  . CESAREAN SECTION     2  . COLONOSCOPY WITH PROPOFOL N/A 03/03/2018   Procedure: COLONOSCOPY WITH PROPOFOL;  Surgeon:  Jonathon Bellows, MD;  Location: [facility];  Service: Gastroenterology;  Laterality: N/A;  . ESOPHAGOGASTRODUODENOSCOPY (EGD) WITH PROPOFOL N/A 03/03/2018   Procedure: ESOPHAGOGASTRODUODENOSCOPY (EGD) WITH PROPOFOL;  Surgeon: Jonathon Bellows, MD;  Location: [facility];  Service: Gastroenterology;  Laterality: N/A;  . ESOPHAGOGASTRODUODENOSCOPY (EGD) WITH PROPOFOL N/A 04/26/2018   Procedure: ESOPHAGOGASTRODUODENOSCOPY (EGD) WITH PROPOFOL;  Surgeon: Jonathon Bellows, MD;  Location: [facility]COPY;  Service: Gastroenterology;  Laterality: N/A;  . JOINT REPLACEMENT    . REVERSE SHOULDER ARTHROPLASTY Right 11/20/2017   Procedure: REVERSE SHOULDER ARTHROPLASTY;  Surgeon: Leim Fabry, MD;  Location: [facility];  Service: Orthopedics;  Laterality: Right;  . ROTATOR CUFF REPAIR Right    right shoulder   10 YRS  . TOTAL HIP ARTHROPLASTY Bilateral 2005, 2006   Bilateral, [place]    There were no vitals filed for this visit.  Subjective Assessment - 09/22/19 1602    Subjective  Pt reports soreness that lasted 24 hours with good compliance with HEP. No falls since last visit. Knee pain bilat 5/10.    Pertinent History  Patient is an 86 year old female presenting with chronic LBP. Patient reports 2 years ago she had a fall at church that started her LBP and bilat LE weakness.Patient had a  fusion of L4-S1 2016. Has had injections for pain over the past 2 years and they "used to work" but are no longer helping her pain. Reports LBP is midline without redicular symptoms into LEs; denies numbness/tingling. Reports pain is sharp in the middle of middle of the spine following prolonged ambulation and standing. Rest helps her pain. Worst: 8/10 best: 0/10. Patient reports she started using SPC about 6 months ago when she noticed she had a "limp" when she walks. She lives with her husband who is physically independent, reports indpendence with ADLs, does have grab bars and shower seat in bathroom but does not use them. Has has  3 floors to her home, but stays on 1 level. Does have stairs in/out of garage (6 steps) with bilat handrails that she reports no issues using. Patient has a rail on the side of her bed that she says assists her in supine<> sit transfers.  She can drives but prefers to have her husband. She enjoys working in her church and visit her friends, and wants to be able to feel comfortable walking without her cane to complete these community activies. Pt denies N/V, B&B changes, unexplained weight fluctuation, saddle paresthesia, fever, night sweats, or unrelenting night pain at this time.    Limitations  House hold activities;Lifting;Standing;Walking    How long can you sit comfortably?  unlimited    How long can you stand comfortably?  5-60mins    How long can you walk comfortably?  76mins    Diagnostic tests  MRI July 2020: Diffuse lumbar spine spondylosis as described above most severe at L2-3. At L2-3 there is a broad-based disc bulge. Moderate bilateral facet arthropathy. Moderate-severe spinal stenosis and bilateral ateral recess stenosis. Mild bilateral foraminal stenosis. CT September 2020 Moderate spinal stenosis at L2-3 as demonstrated on the priorMRI.    Patient Stated Goals  Walk without a cane     THEREX  -Nustepseat setting 4,;L4 87mins with cuing to maintain SPM over 60, able to maintain fairly well -Mini squat with hands on bar 2x10 with cueing to keep hips back; too easy for pt -Step down on box with one riser from 6" step 2x10  -side stepping on airex balance beam; x2 each direction with good compliance from pt; PT added balance stones as visual cues to step over spaced a foot apart; pt was able to complete with good success despite some hesitation  -Monster walks with red band and light support with R hand on bar x6 steps forwards and backwards; PT cues for increasing stride length  -Ladder with diagonal stepping forwards 2x2; pt demonstrates  good balance and endurance with  stepping                         PT Education - 09/22/19 1735    Education Details  therex form    Person(s) Educated  Patient    Methods  Explanation;Demonstration;Verbal cues    Comprehension  Verbalized understanding       PT Short Term Goals - 08/30/19 1430      PT SHORT TERM GOAL #1   Title  Pt will be independent with HEP in order to improve strength and decrease back pain in order to improve pain-free function at home and work.    Baseline  08/30/19 HEP given    Time  4    Period  Weeks    Status  New        PT  Long Term Goals - 08/30/19 1430      PT LONG TERM GOAL #1   Title  Patient will increase FOTO score to 51 demonstrate predicted increase in functional mobility to complete ADLs    Baseline  08/30/19 37    Time  12    Period  Weeks    Status  New      PT LONG TERM GOAL #2   Title  Pt will decrease worst back pain as reported on NPRS by at least 2 points in order to demonstrate clinically significant reduction in back pain.    Baseline  08/30/19 8/10    Time  6    Period  Weeks    Status  New      PT LONG TERM GOAL #3   Title  Pt will decrease 5TSTS by at least 3 seconds, without UE support, in order to demonstrate clinically significant improvement in LE strength    Baseline  08/30/19 18.3sec with armrests    Time  6    Period  Weeks    Status  New      PT LONG TERM GOAL #4   Title  Pt will increase 6MWT by at least 54m (178ft) in order to demonstrate clinically significant improvement in cardiopulmonary endurance and community ambulation    Baseline  08/30/19 250ft SPC in RUE, 2 seated rest breaks (see gait notes)    Time  6    Period  Weeks    Status  New      PT LONG TERM GOAL #5   Title  Pt will increase 10MWT by at least 0.13 m/s in order to demonstrate clinically significant improvement in community ambulation.    Baseline  08/30/19 0.15m/s    Time  6    Period  Weeks    Status  New            Plan - 09/22/19  1737    Clinical Impression Statement  PT progressed strength and balance therex for increasing muscular endurance. Pt responded very well to exercises and PT had to progress exercises within the session to challenge pt. Pt still demonstrates some lack of confidence with balance, but continues to make progress each day. PT will continue to progress as appropriate.    Personal Factors and Comorbidities  Comorbidity 1;Age;Comorbidity 2;Fitness;Past/Current Experience;Time since onset of injury/illness/exacerbation;Behavior Pattern    Comorbidities  HTN; bilat THA    Examination-Activity Limitations  Bathing;Squat;Lift;Stairs;Locomotion Level;Carry;Transfers    Examination-Participation Restrictions  Church;Cleaning;Community Activity;Laundry    Stability/Clinical Decision Making  Evolving/Moderate complexity    Clinical Decision Making  Moderate    Rehab Potential  Good    PT Frequency  2x / week    PT Duration  8 weeks    PT Treatment/Interventions  ADLs/Self Care Home Management;Electrical Stimulation;Therapeutic activities;Patient/family education;Taping;Therapeutic exercise;DME Instruction;Aquatic Therapy;Moist Heat;Stair training;Traction;Ultrasound;Cryotherapy;Functional mobility training;Neuromuscular re-education;Manual techniques;Dry needling;Passive range of motion    PT Next Visit Plan  dynamic balance, hip/core strengthening    PT Home Exercise Plan  [id number]    Consulted and Agree with Plan of Care  Patient       Patient will benefit from skilled therapeutic intervention in order to improve the following deficits and impairments:  Pain, Improper body mechanics, Increased fascial restricitons, Abnormal gait, Decreased mobility, Increased muscle spasms, Postural dysfunction, Decreased activity tolerance, Decreased endurance, Decreased range of motion, Decreased strength, Decreased balance, Impaired flexibility, Difficulty walking  Visit Diagnosis: Other abnormalities of gait and  mobility  Chronic  midline low back pain without sciatica  History of falling     Problem List Patient Active Problem List   Diagnosis Date Noted  . Chronic low back pain 02/15/2019  . Difficulty walking 02/09/2019  . Acute pain of right shoulder 08/10/2018  . Foot drop 11/23/2017  . Status post shoulder replacement 11/20/2017  . Postmenopausal estrogen deficiency 10/15/2017  . Localized osteoarthritis of right shoulder 10/15/2017  . Difficulty sleeping 10/15/2017  . Acute cystitis without hematuria 07/20/2015  . Bilateral knee pain 06/23/2014  . Lumbar radiculopathy, chronic 06/23/2014  . Moderate aortic stenosis 11/03/2013  . Right bundle branch block 05/04/2013  . Hyperlipidemia 06/29/2012  . GERD (gastroesophageal reflux disease) 07/31/2011  . Hypertension 07/31/2011   Shelton Silvas PT, DPT Ivin Booty, SPT Shelton Silvas 09/22/2019, 5:57 PM  [facility] [address], [place], [address] Phone: [phone]   Fax:  [phone]  Name: Robin Alexander MRN: [id number] Date of Birth: [birth year]

## 2019-09-23 ENCOUNTER — Telehealth: Payer: Self-pay

## 2019-09-23 MED ORDER — AMLODIPINE BESY-BENAZEPRIL HCL 5-20 MG PO CAPS: 1.0000 | ORAL_CAPSULE | Freq: Every day | ORAL | 3 refills | Status: AC

## 2019-09-23 NOTE — Telephone Encounter (Signed)
Requested  LOTREL and sent to pharmacy per patient request. Timmie Calix,cma

## 2019-09-23 NOTE — Telephone Encounter (Signed)
Sent to pharmacy.  Robin Alexander,cma

## 2019-09-26 DIAGNOSIS — I10 Essential (primary) hypertension: Secondary | ICD-10-CM | POA: Diagnosis not present

## 2019-09-26 DIAGNOSIS — N1832 Chronic kidney disease, stage 3b: Secondary | ICD-10-CM | POA: Diagnosis not present

## 2019-09-27 ENCOUNTER — Ambulatory Visit: Payer: PPO | Admitting: Physical Therapy

## 2019-09-29 ENCOUNTER — Ambulatory Visit: Payer: PPO | Admitting: Physical Therapy

## 2019-09-29 ENCOUNTER — Encounter: Payer: Self-pay | Admitting: Physical Therapy

## 2019-09-29 ENCOUNTER — Other Ambulatory Visit: Payer: Self-pay

## 2019-09-29 DIAGNOSIS — M545 Low back pain: Secondary | ICD-10-CM | POA: Diagnosis not present

## 2019-09-29 DIAGNOSIS — Z9181 History of falling: Secondary | ICD-10-CM

## 2019-09-29 DIAGNOSIS — R2689 Other abnormalities of gait and mobility: Secondary | ICD-10-CM

## 2019-09-29 DIAGNOSIS — G8929 Other chronic pain: Secondary | ICD-10-CM

## 2019-09-29 NOTE — Therapy (Addendum)
[facility] [address], [place], [address] Phone: [phone]   Fax:  [phone]  Physical Therapy Treatment  Patient Details  Name: Robin Alexander MRN: [id number] Date of Birth: 20-Mar-1933 No data recorded  Encounter Date: 09/29/2019  PT End of Session - 09/29/19 1526    Visit Number  7    Number of Visits  17    Date for PT Re-Evaluation  10/26/19    PT Start Time  0226    PT Stop Time  0310    PT Time Calculation (min)  44 min    Equipment Utilized During Treatment  Gait belt    Activity Tolerance  Patient tolerated treatment well    Behavior During Therapy  [facility] for tasks assessed/performed       Past Medical History:  Diagnosis Date  . Abnormal CT scan, chest 03/20/2014  . Anemia   . Arthritis    shoulders  . Chicken pox   . Cold agglutinin disease   . Complication of anesthesia 2016   slow to wake up after anesthesia  . Dyspnea    uses inhaler when she gets bronchitis  . GERD (gastroesophageal reflux disease)   . GI bleed 03/16/2018  . Heart murmur   . High cholesterol   . HTN (hypertension)   . Multiple duodenal ulcers   . Pneumonia    spring 2015    Past Surgical History:  Procedure Laterality Date  . ABDOMINAL HYSTERECTOMY     in 50's  . ANTERIOR LAT LUMBAR FUSION Right 02/28/2015   Procedure: ANTERIOR LATERAL LUMBAR FUSION 1 LEVEL;  Surgeon: Phylliss Bob, MD;  Location: [facility];  Service: Orthopedics;  Laterality: Right;  Right sided lumbar 3-4 lateral interbody fusion  . APPENDECTOMY  1950  . BACK SURGERY     rod in back  . BICEPT TENODESIS Right 11/20/2017   Procedure: BICEPS TENODESIS;  Surgeon: Leim Fabry, MD;  Location: [facility];  Service: Orthopedics;  Laterality: Right;  . CATARACT EXTRACTION W/ INTRAOCULAR LENS  IMPLANT, BILATERAL Bilateral 2011  . CESAREAN SECTION     2  . COLONOSCOPY WITH PROPOFOL N/A 03/03/2018   Procedure: COLONOSCOPY WITH PROPOFOL;  Surgeon:  Jonathon Bellows, MD;  Location: [facility]COPY;  Service: Gastroenterology;  Laterality: N/A;  . ESOPHAGOGASTRODUODENOSCOPY (EGD) WITH PROPOFOL N/A 03/03/2018   Procedure: ESOPHAGOGASTRODUODENOSCOPY (EGD) WITH PROPOFOL;  Surgeon: Jonathon Bellows, MD;  Location: [facility];  Service: Gastroenterology;  Laterality: N/A;  . ESOPHAGOGASTRODUODENOSCOPY (EGD) WITH PROPOFOL N/A 04/26/2018   Procedure: ESOPHAGOGASTRODUODENOSCOPY (EGD) WITH PROPOFOL;  Surgeon: Jonathon Bellows, MD;  Location: [facility];  Service: Gastroenterology;  Laterality: N/A;  . JOINT REPLACEMENT    . REVERSE SHOULDER ARTHROPLASTY Right 11/20/2017   Procedure: REVERSE SHOULDER ARTHROPLASTY;  Surgeon: Leim Fabry, MD;  Location: [facility];  Service: Orthopedics;  Laterality: Right;  . ROTATOR CUFF REPAIR Right    right shoulder   10 YRS  . TOTAL HIP ARTHROPLASTY Bilateral 2005, 2006   Bilateral, [place]    There were no vitals filed for this visit.  Subjective Assessment - 09/29/19 1428    Subjective  Pt reports no soreness from last session. No falls since last session. No knee pain today.    Pertinent History  Patient is an 86 year old female presenting with chronic LBP. Patient reports 2 years ago she had a fall at church that started her LBP and bilat LE weakness.Patient had a fusion of L4-S1 2016. Has  had injections for pain over the past 2 years and they "used to work" but are no longer helping her pain. Reports LBP is midline without redicular symptoms into LEs; denies numbness/tingling. Reports pain is sharp in the middle of middle of the spine following prolonged ambulation and standing. Rest helps her pain. Worst: 8/10 best: 0/10. Patient reports she started using SPC about 6 months ago when she noticed she had a "limp" when she walks. She lives with her husband who is physically independent, reports indpendence with ADLs, does have grab bars and shower seat in bathroom but does not use them. Has has 3 floors to her home, but  stays on 1 level. Does have stairs in/out of garage (6 steps) with bilat handrails that she reports no issues using. Patient has a rail on the side of her bed that she says assists her in supine<> sit transfers.  She can drives but prefers to have her husband. She enjoys working in her church and visit her friends, and wants to be able to feel comfortable walking without her cane to complete these community activies. Pt denies N/V, B&B changes, unexplained weight fluctuation, saddle paresthesia, fever, night sweats, or unrelenting night pain at this time.    Limitations  House hold activities;Lifting;Standing;Walking    How long can you sit comfortably?  unlimited    How long can you stand comfortably?  5-39mins    How long can you walk comfortably?  75mins    Diagnostic tests  MRI July 2020: Diffuse lumbar spine spondylosis as described above most severe at L2-3. At L2-3 there is a broad-based disc bulge. Moderate bilateral facet arthropathy. Moderate-severe spinal stenosis and bilateral ateral recess stenosis. Mild bilateral foraminal stenosis. CT September 2020 Moderate spinal stenosis at L2-3 as demonstrated on the priorMRI.    Patient Stated Goals  Walk without a cane           THEREX -Nustep seat setting 4; L4 2 mins and 3 mins L3 keeping above 50 spm for 5 mins -Standing balance on airex with catch  x10 inside base with PT contact guard assist guarding; good ability to adjust to small throws outside base -Standing on airex pad with eyes closed x15 sec x30 sec x1 min with great balance -Standing mini lunges with 3x10 ea leg using 2 fingers on fist set then no hands on last set; hard with R foot pushing back, but able to balance with no hands    Gait Training -300 feet with alternating speed and pivot turns with good compliance  -Pt reports feeling tired after walk and had to rest                         PT Education - 09/29/19 1427    Education Details  therex  form    Person(s) Educated  Patient    Methods  Explanation;Demonstration;Verbal cues    Comprehension  Verbalized understanding;Returned demonstration       PT Short Term Goals - 08/30/19 1430      PT SHORT TERM GOAL #1   Title  Pt will be independent with HEP in order to improve strength and decrease back pain in order to improve pain-free function at home and work.    Baseline  08/30/19 HEP given    Time  4    Period  Weeks    Status  New        PT Long Term Goals - 08/30/19 1430  PT LONG TERM GOAL #1   Title  Patient will increase FOTO score to 51 demonstrate predicted increase in functional mobility to complete ADLs    Baseline  08/30/19 37    Time  12    Period  Weeks    Status  New      PT LONG TERM GOAL #2   Title  Pt will decrease worst back pain as reported on NPRS by at least 2 points in order to demonstrate clinically significant reduction in back pain.    Baseline  08/30/19 8/10    Time  6    Period  Weeks    Status  New      PT LONG TERM GOAL #3   Title  Pt will decrease 5TSTS by at least 3 seconds, without UE support, in order to demonstrate clinically significant improvement in LE strength    Baseline  08/30/19 18.3sec with armrests    Time  6    Period  Weeks    Status  New      PT LONG TERM GOAL #4   Title  Pt will increase 6MWT by at least 86m (12ft) in order to demonstrate clinically significant improvement in cardiopulmonary endurance and community ambulation    Baseline  08/30/19 219ft SPC in RUE, 2 seated rest breaks (see gait notes)    Time  6    Period  Weeks    Status  New      PT LONG TERM GOAL #5   Title  Pt will increase 10MWT by at least 0.13 m/s in order to demonstrate clinically significant improvement in community ambulation.    Baseline  08/30/19 0.61m/s    Time  6    Period  Weeks    Status  New            Plan - 09/29/19 1510    Clinical Impression Statement  PT progressed strength and balance therex. Pt performs  exercises with ease and often surprises yourself with what she can accomplish, but still lacks some confidence. Patient is able to comply with cuing given from PT to complete therex with proper technique. PT will focus on confidence and increasing balance training as appropriate.    Personal Factors and Comorbidities  Comorbidity 1;Age;Comorbidity 2;Fitness;Past/Current Experience;Time since onset of injury/illness/exacerbation;Behavior Pattern    Comorbidities  HTN; bilat THA    Examination-Activity Limitations  Bathing;Squat;Lift;Stairs;Locomotion Level;Carry;Transfers    Examination-Participation Restrictions  Church;Cleaning;Community Activity;Laundry    Stability/Clinical Decision Making  Evolving/Moderate complexity    Clinical Decision Making  Moderate    Rehab Potential  Good    PT Frequency  2x / week    PT Duration  8 weeks    PT Treatment/Interventions  ADLs/Self Care Home Management;Electrical Stimulation;Therapeutic activities;Patient/family education;Taping;Therapeutic exercise;DME Instruction;Aquatic Therapy;Moist Heat;Stair training;Traction;Ultrasound;Cryotherapy;Functional mobility training;Neuromuscular re-education;Manual techniques;Dry needling;Passive range of motion    PT Next Visit Plan  dynamic balance, hip/core strengthening    PT Home Exercise Plan  [id number]    Consulted and Agree with Plan of Care  Patient       Patient will benefit from skilled therapeutic intervention in order to improve the following deficits and impairments:  Pain, Improper body mechanics, Increased fascial restricitons, Abnormal gait, Decreased mobility, Increased muscle spasms, Postural dysfunction, Decreased activity tolerance, Decreased endurance, Decreased range of motion, Decreased strength, Decreased balance, Impaired flexibility, Difficulty walking  Visit Diagnosis: Other abnormalities of gait and mobility  Chronic midline low back pain without sciatica  History of  falling  Problem List Patient Active Problem List   Diagnosis Date Noted  . Chronic low back pain 02/15/2019  . Difficulty walking 02/09/2019  . Acute pain of right shoulder 08/10/2018  . Foot drop 11/23/2017  . Status post shoulder replacement 11/20/2017  . Postmenopausal estrogen deficiency 10/15/2017  . Localized osteoarthritis of right shoulder 10/15/2017  . Difficulty sleeping 10/15/2017  . Acute cystitis without hematuria 07/20/2015  . Bilateral knee pain 06/23/2014  . Lumbar radiculopathy, chronic 06/23/2014  . Moderate aortic stenosis 11/03/2013  . Right bundle branch block 05/04/2013  . Hyperlipidemia 06/29/2012  . GERD (gastroesophageal reflux disease) 07/31/2011  . Hypertension 07/31/2011    Shelton Silvas PT, DPT Ivin Booty, SPT Shelton Silvas 09/29/2019, 3:36 PM  [facility] [address] [place], [address] Phone: [phone]   Fax:  [phone]  Name: JSSICA MELESIO MRN: [id number] Date of Birth: February 12, 1933

## 2019-10-04 ENCOUNTER — Ambulatory Visit: Payer: PPO | Attending: Family Medicine | Admitting: Physical Therapy

## 2019-10-04 ENCOUNTER — Encounter: Payer: Self-pay | Admitting: Physical Therapy

## 2019-10-04 ENCOUNTER — Other Ambulatory Visit: Payer: Self-pay

## 2019-10-04 DIAGNOSIS — Z9181 History of falling: Secondary | ICD-10-CM | POA: Diagnosis not present

## 2019-10-04 DIAGNOSIS — N1832 Chronic kidney disease, stage 3b: Secondary | ICD-10-CM | POA: Diagnosis not present

## 2019-10-04 DIAGNOSIS — M545 Low back pain: Secondary | ICD-10-CM | POA: Insufficient documentation

## 2019-10-04 DIAGNOSIS — R2689 Other abnormalities of gait and mobility: Secondary | ICD-10-CM

## 2019-10-04 DIAGNOSIS — G8929 Other chronic pain: Secondary | ICD-10-CM | POA: Diagnosis not present

## 2019-10-04 NOTE — Therapy (Addendum)
[facility] [address], [place], [address] Phone: [phone]   Fax:  [phone]  Physical Therapy Treatment  Patient Details  Name: Robin Alexander MRN: [id number] Date of Birth: 1932-10-22 No data recorded  Encounter Date: 10/04/2019  PT End of Session - 10/04/19 1028    Visit Number  8    Number of Visits  17    Date for PT Re-Evaluation  10/26/19    PT Start Time  0942    PT Stop Time  1023    PT Time Calculation (min)  41 min    Equipment Utilized During Treatment  Gait belt    Activity Tolerance  Patient tolerated treatment well    Behavior During Therapy  [facility] for tasks assessed/performed       Past Medical History:  Diagnosis Date  . Abnormal CT scan, chest 03/20/2014  . Anemia   . Arthritis    shoulders  . Chicken pox   . Cold agglutinin disease   . Complication of anesthesia 2016   slow to wake up after anesthesia  . Dyspnea    uses inhaler when she gets bronchitis  . GERD (gastroesophageal reflux disease)   . GI bleed 03/16/2018  . Heart murmur   . High cholesterol   . HTN (hypertension)   . Multiple duodenal ulcers   . Pneumonia    spring 2015    Past Surgical History:  Procedure Laterality Date  . ABDOMINAL HYSTERECTOMY     in 50's  . ANTERIOR LAT LUMBAR FUSION Right 02/28/2015   Procedure: ANTERIOR LATERAL LUMBAR FUSION 1 LEVEL;  Surgeon: Phylliss Bob, MD;  Location: [place];  Service: Orthopedics;  Laterality: Right;  Right sided lumbar 3-4 lateral interbody fusion  . APPENDECTOMY  1950  . BACK SURGERY     rod in back  . BICEPT TENODESIS Right 11/20/2017   Procedure: BICEPS TENODESIS;  Surgeon: Leim Fabry, MD;  Location: [facility];  Service: Orthopedics;  Laterality: Right;  . CATARACT EXTRACTION W/ INTRAOCULAR LENS  IMPLANT, BILATERAL Bilateral 2011  . CESAREAN SECTION     2  . COLONOSCOPY WITH PROPOFOL N/A 03/03/2018   Procedure: COLONOSCOPY WITH PROPOFOL;  Surgeon:  Jonathon Bellows, MD;  Location: [facility];  Service: Gastroenterology;  Laterality: N/A;  . ESOPHAGOGASTRODUODENOSCOPY (EGD) WITH PROPOFOL N/A 03/03/2018   Procedure: ESOPHAGOGASTRODUODENOSCOPY (EGD) WITH PROPOFOL;  Surgeon: Jonathon Bellows, MD;  Location: [facility];  Service: Gastroenterology;  Laterality: N/A;  . ESOPHAGOGASTRODUODENOSCOPY (EGD) WITH PROPOFOL N/A 04/26/2018   Procedure: ESOPHAGOGASTRODUODENOSCOPY (EGD) WITH PROPOFOL;  Surgeon: Jonathon Bellows, MD;  Location: [facility]COPY;  Service: Gastroenterology;  Laterality: N/A;  . JOINT REPLACEMENT    . REVERSE SHOULDER ARTHROPLASTY Right 11/20/2017   Procedure: REVERSE SHOULDER ARTHROPLASTY;  Surgeon: Leim Fabry, MD;  Location: [facility];  Service: Orthopedics;  Laterality: Right;  . ROTATOR CUFF REPAIR Right    right shoulder   10 YRS  . TOTAL HIP ARTHROPLASTY Bilateral 2005, 2006   Bilateral, [place]    There were no vitals filed for this visit.  Subjective Assessment - 10/04/19 0940    Subjective  Pt reports feeling a little tired since last session. No knee pain today, just reports feeling a little weakness today. No falls since last time.    Pertinent History  Patient is an 86 year old female presenting with chronic LBP. Patient reports 2 years ago she had a fall at church that started her LBP and bilat  LE weakness.Patient had a fusion of L4-S1 2016. Has had injections for pain over the past 2 years and they "used to work" but are no longer helping her pain. Reports LBP is midline without redicular symptoms into LEs; denies numbness/tingling. Reports pain is sharp in the middle of middle of the spine following prolonged ambulation and standing. Rest helps her pain. Worst: 8/10 best: 0/10. Patient reports she started using SPC about 6 months ago when she noticed she had a "limp" when she walks. She lives with her husband who is physically independent, reports indpendence with ADLs, does have grab bars and shower seat in bathroom but  does not use them. Has has 3 floors to her home, but stays on 1 level. Does have stairs in/out of garage (6 steps) with bilat handrails that she reports no issues using. Patient has a rail on the side of her bed that she says assists her in supine<> sit transfers.  She can drives but prefers to have her husband. She enjoys working in her church and visit her friends, and wants to be able to feel comfortable walking without her cane to complete these community activies. Pt denies N/V, B&B changes, unexplained weight fluctuation, saddle paresthesia, fever, night sweats, or unrelenting night pain at this time.    Limitations  House hold activities;Lifting;Standing;Walking    How long can you sit comfortably?  unlimited    How long can you stand comfortably?  5-66mins    How long can you walk comfortably?  27mins    Diagnostic tests  MRI July 2020: Diffuse lumbar spine spondylosis as described above most severe at L2-3. At L2-3 there is a broad-based disc bulge. Moderate bilateral facet arthropathy. Moderate-severe spinal stenosis and bilateral ateral recess stenosis. Mild bilateral foraminal stenosis. CT September 2020 Moderate spinal stenosis at L2-3 as demonstrated on the priorMRI.    Patient Stated Goals  Walk without a cane        THEREX -Nustep seat setting 4; L4 2 mins and 3 mins L3 keeping above 60 spm for 5 mins -Bridge in supine 2x10, bridge in supine with marches 1x4 -Sit to stand to mat table without hands x12; pt states it was easy; standing squat x15 too easy for pt -Squat on airex pad 1x15; will add weights to next session; too easy for pt  -SL balance on small dyna disc 3x10 with red band at ankles standing ABD with PT cues to keep hips in neutral when standing on R side, some difficulty keeping hip extension -Lateral lunge 2x5 ea direction alternating directions with cueing to step out wide, keep shoulders neutral, and picking up foot to come to standing; great balance and  coordination                       PT Education - 10/04/19 1033    Education Details  therex form    Person(s) Educated  Patient    Methods  Demonstration;Tactile cues;Verbal cues    Comprehension  Verbalized understanding;Returned demonstration       PT Short Term Goals - 08/30/19 1430      PT SHORT TERM GOAL #1   Title  Pt will be independent with HEP in order to improve strength and decrease back pain in order to improve pain-free function at home and work.    Baseline  08/30/19 HEP given    Time  4    Period  Weeks    Status  New  PT Long Term Goals - 08/30/19 1430      PT LONG TERM GOAL #1   Title  Patient will increase FOTO score to 51 demonstrate predicted increase in functional mobility to complete ADLs    Baseline  08/30/19 37    Time  12    Period  Weeks    Status  New      PT LONG TERM GOAL #2   Title  Pt will decrease worst back pain as reported on NPRS by at least 2 points in order to demonstrate clinically significant reduction in back pain.    Baseline  08/30/19 8/10    Time  6    Period  Weeks    Status  New      PT LONG TERM GOAL #3   Title  Pt will decrease 5TSTS by at least 3 seconds, without UE support, in order to demonstrate clinically significant improvement in LE strength    Baseline  08/30/19 18.3sec with armrests    Time  6    Period  Weeks    Status  New      PT LONG TERM GOAL #4   Title  Pt will increase 6MWT by at least 66m (117ft) in order to demonstrate clinically significant improvement in cardiopulmonary endurance and community ambulation    Baseline  08/30/19 228ft SPC in RUE, 2 seated rest breaks (see gait notes)    Time  6    Period  Weeks    Status  New      PT LONG TERM GOAL #5   Title  Pt will increase 10MWT by at least 0.13 m/s in order to demonstrate clinically significant improvement in community ambulation.    Baseline  08/30/19 0.30m/s    Time  6    Period  Weeks    Status  New             Plan - 10/04/19 1025    Clinical Impression Statement  PT progressed strength and balance therex with focus on dynamic SL exercises. Pt struggles with confidence and believing that she is making progress. PT continually has to add progressions to planned exercises becuase pt exceeds expectaions. Pt is able to correct motor patterns after a few cues from PT. Next session, PT will reasses goals and make changes as necessary.    Personal Factors and Comorbidities  Comorbidity 1;Age;Comorbidity 2;Fitness;Past/Current Experience;Time since onset of injury/illness/exacerbation;Behavior Pattern    Comorbidities  HTN; bilat THA    Examination-Activity Limitations  Bathing;Squat;Lift;Stairs;Locomotion Level;Carry;Transfers    Examination-Participation Restrictions  Church;Cleaning;Community Activity;Laundry    Stability/Clinical Decision Making  Evolving/Moderate complexity    Clinical Decision Making  Moderate    Rehab Potential  Good    PT Frequency  2x / week    PT Duration  8 weeks    PT Treatment/Interventions  ADLs/Self Care Home Management;Electrical Stimulation;Therapeutic activities;Patient/family education;Taping;Therapeutic exercise;DME Instruction;Aquatic Therapy;Moist Heat;Stair training;Traction;Ultrasound;Cryotherapy;Functional mobility training;Neuromuscular re-education;Manual techniques;Dry needling;Passive range of motion    PT Next Visit Plan  dynamic balance, hip/core strengthening    PT Home Exercise Plan  [id number]    Consulted and Agree with Plan of Care  Patient       Patient will benefit from skilled therapeutic intervention in order to improve the following deficits and impairments:  Pain, Improper body mechanics, Increased fascial restricitons, Abnormal gait, Decreased mobility, Increased muscle spasms, Postural dysfunction, Decreased activity tolerance, Decreased endurance, Decreased range of motion, Decreased strength, Decreased balance, Impaired flexibility,  Difficulty walking  Visit Diagnosis: Other abnormalities of gait and mobility  History of falling     Problem List Patient Active Problem List   Diagnosis Date Noted  . Chronic low back pain 02/15/2019  . Difficulty walking 02/09/2019  . Acute pain of right shoulder 08/10/2018  . Foot drop 11/23/2017  . Status post shoulder replacement 11/20/2017  . Postmenopausal estrogen deficiency 10/15/2017  . Localized osteoarthritis of right shoulder 10/15/2017  . Difficulty sleeping 10/15/2017  . Acute cystitis without hematuria 07/20/2015  . Bilateral knee pain 06/23/2014  . Lumbar radiculopathy, chronic 06/23/2014  . Moderate aortic stenosis 11/03/2013  . Right bundle branch block 05/04/2013  . Hyperlipidemia 06/29/2012  . GERD (gastroesophageal reflux disease) 07/31/2011  . Hypertension 07/31/2011   Shelton Silvas PT, DPT Ivin Booty, SPT Shelton Silvas 10/04/2019, 11:46 AM  [facility] [address] [place], [address] Phone: [phone]   Fax:  [phone]  Name: Robin Alexander MRN: [id number] Date of Birth: 1932-10-25

## 2019-10-06 ENCOUNTER — Ambulatory Visit: Payer: PPO | Admitting: Physical Therapy

## 2019-10-06 ENCOUNTER — Other Ambulatory Visit: Payer: Self-pay

## 2019-10-06 ENCOUNTER — Encounter: Payer: Self-pay | Admitting: Physical Therapy

## 2019-10-06 DIAGNOSIS — Z9181 History of falling: Secondary | ICD-10-CM

## 2019-10-06 DIAGNOSIS — R2689 Other abnormalities of gait and mobility: Secondary | ICD-10-CM | POA: Diagnosis not present

## 2019-10-06 NOTE — Therapy (Addendum)
[facility] [address], [place], [address] Phone: [phone]   Fax:  [phone]  Physical Therapy Treatment  Patient Details  Name: Robin Alexander MRN: [id number] Date of Birth: [birth year] No data recorded  Encounter Date: 10/06/2019  PT End of Session - 10/06/19 1202    Visit Number  9    Number of Visits  17    Date for PT Re-Evaluation  10/26/19    PT Start Time  1114    PT Stop Time  1155    PT Time Calculation (min)  41 min    Equipment Utilized During Treatment  Gait belt    Activity Tolerance  Patient tolerated treatment well    Behavior During Therapy  [facility] for tasks assessed/performed       Past Medical History:  Diagnosis Date  . Abnormal CT scan, chest 03/20/2014  . Anemia   . Arthritis    shoulders  . Chicken pox   . Cold agglutinin disease   . Complication of anesthesia 2016   slow to wake up after anesthesia  . Dyspnea    uses inhaler when she gets bronchitis  . GERD (gastroesophageal reflux disease)   . GI bleed 03/16/2018  . Heart murmur   . High cholesterol   . HTN (hypertension)   . Multiple duodenal ulcers   . Pneumonia    spring 2015    Past Surgical History:  Procedure Laterality Date  . ABDOMINAL HYSTERECTOMY     in 50's  . ANTERIOR LAT LUMBAR FUSION Right 02/28/2015   Procedure: ANTERIOR LATERAL LUMBAR FUSION 1 LEVEL;  Surgeon: Phylliss Bob, MD;  Location: [place];  Service: Orthopedics;  Laterality: Right;  Right sided lumbar 3-4 lateral interbody fusion  . APPENDECTOMY  1950  . BACK SURGERY     rod in back  . BICEPT TENODESIS Right 11/20/2017   Procedure: BICEPS TENODESIS;  Surgeon: Leim Fabry, MD;  Location: [facility];  Service: Orthopedics;  Laterality: Right;  . CATARACT EXTRACTION W/ INTRAOCULAR LENS  IMPLANT, BILATERAL Bilateral 2011  . CESAREAN SECTION     2  . COLONOSCOPY WITH PROPOFOL N/A 03/03/2018   Procedure: COLONOSCOPY WITH PROPOFOL;  Surgeon:  Jonathon Bellows, MD;  Location: [facility]COPY;  Service: Gastroenterology;  Laterality: N/A;  . ESOPHAGOGASTRODUODENOSCOPY (EGD) WITH PROPOFOL N/A 03/03/2018   Procedure: ESOPHAGOGASTRODUODENOSCOPY (EGD) WITH PROPOFOL;  Surgeon: Jonathon Bellows, MD;  Location: [facility] ENDOSCOPY;  Service: Gastroenterology;  Laterality: N/A;  . ESOPHAGOGASTRODUODENOSCOPY (EGD) WITH PROPOFOL N/A 04/26/2018   Procedure: ESOPHAGOGASTRODUODENOSCOPY (EGD) WITH PROPOFOL;  Surgeon: Jonathon Bellows, MD;  Location: [facility] ENDOSCOPY;  Service: Gastroenterology;  Laterality: N/A;  . JOINT REPLACEMENT    . REVERSE SHOULDER ARTHROPLASTY Right 11/20/2017   Procedure: REVERSE SHOULDER ARTHROPLASTY;  Surgeon: Leim Fabry, MD;  Location: [facility];  Service: Orthopedics;  Laterality: Right;  . ROTATOR CUFF REPAIR Right    right shoulder   10 YRS  . TOTAL HIP ARTHROPLASTY Bilateral 2005, 2006   Bilateral, [place]    There were no vitals filed for this visit.  Subjective Assessment - 10/06/19 1114    Subjective  Pt reports some knee soreness after last session. She says she "doesn't walk as well in the mornings". HEP has been going well.    Pertinent History  Patient is an 86 year old female presenting with chronic LBP. Patient reports 2 years ago she had a fall at church that started her LBP and bilat LE weakness.Patient  had a fusion of L4-S1 2016. Has had injections for pain over the past 2 years and they "used to work" but are no longer helping her pain. Reports LBP is midline without redicular symptoms into LEs; denies numbness/tingling. Reports pain is sharp in the middle of middle of the spine following prolonged ambulation and standing. Rest helps her pain. Worst: 8/10 best: 0/10. Patient reports she started using SPC about 6 months ago when she noticed she had a "limp" when she walks. She lives with her husband who is physically independent, reports indpendence with ADLs, does have grab bars and shower seat in bathroom but does not use them.  Has has 3 floors to her home, but stays on 1 level. Does have stairs in/out of garage (6 steps) with bilat handrails that she reports no issues using. Patient has a rail on the side of her bed that she says assists her in supine<> sit transfers.  She can drives but prefers to have her husband. She enjoys working in her church and visit her friends, and wants to be able to feel comfortable walking without her cane to complete these community activies. Pt denies N/V, B&B changes, unexplained weight fluctuation, saddle paresthesia, fever, night sweats, or unrelenting night pain at this time.    Limitations  House hold activities;Lifting;Standing;Walking    How long can you sit comfortably?  unlimited    How long can you stand comfortably?  5-17mins    How long can you walk comfortably?  79mins    Diagnostic tests  MRI July 2020: Diffuse lumbar spine spondylosis as described above most severe at L2-3. At L2-3 there is a broad-based disc bulge. Moderate bilateral facet arthropathy. Moderate-severe spinal stenosis and bilateral ateral recess stenosis. Mild bilateral foraminal stenosis. CT September 2020 Moderate spinal stenosis at L2-3 as demonstrated on the priorMRI.    Patient Stated Goals  Walk without a cane       THEREX -Nustep L2 keeping spm above 60 for 4 minutes -Squat 5# x10; too easy; 10# 3x10, 12 PT cueing for slow tempo on way down and increasing depth with decent carryover -March on swiss ball 3x8 ea leg with some difficulty initiating hip flexion and maintaining balance on ball; has major apprehension sitting on ball, but was able to complete exercise -Standing SL taps to 3 balance stones in R/L/Forward 3x3,5,5  -Tandem walk forwards and backwards x3 ea direction -Tandem walk forwards and backwards eyes closed and one hand light support on handrail x3 able to perform with good success                          PT Education - 10/06/19 1159    Education Details  therex  form    Person(s) Educated  Patient    Methods  Explanation;Tactile cues;Verbal cues    Comprehension  Verbalized understanding       PT Short Term Goals - 08/30/19 1430      PT SHORT TERM GOAL #1   Title  Pt will be independent with HEP in order to improve strength and decrease back pain in order to improve pain-free function at home and work.    Baseline  08/30/19 HEP given    Time  4    Period  Weeks    Status  New        PT Long Term Goals - 08/30/19 1430      PT LONG TERM GOAL #1   Title  Patient will increase FOTO score to 51 demonstrate predicted increase in functional mobility to complete ADLs    Baseline  08/30/19 37    Time  12    Period  Weeks    Status  New      PT LONG TERM GOAL #2   Title  Pt will decrease worst back pain as reported on NPRS by at least 2 points in order to demonstrate clinically significant reduction in back pain.    Baseline  08/30/19 8/10    Time  6    Period  Weeks    Status  New      PT LONG TERM GOAL #3   Title  Pt will decrease 5TSTS by at least 3 seconds, without UE support, in order to demonstrate clinically significant improvement in LE strength    Baseline  08/30/19 18.3sec with armrests    Time  6    Period  Weeks    Status  New      PT LONG TERM GOAL #4   Title  Pt will increase 6MWT by at least 53m (133ft) in order to demonstrate clinically significant improvement in cardiopulmonary endurance and community ambulation    Baseline  08/30/19 213ft SPC in RUE, 2 seated rest breaks (see gait notes)    Time  6    Period  Weeks    Status  New      PT LONG TERM GOAL #5   Title  Pt will increase 10MWT by at least 0.13 m/s in order to demonstrate clinically significant improvement in community ambulation.    Baseline  08/30/19 0.46m/s    Time  6    Period  Weeks    Status  New            Plan - 10/06/19 1203    Clinical Impression Statement  PT progressed balance therex with swiss ball and strength therex by increasing  load in order to increase pt confidence and improve balance. Pt is making progress with balance and is continually rising to challenges. Pt makes corrections to form with good carryover in each set. PT will continue with progressions as needed and plans to reasses goals next session.    Personal Factors and Comorbidities  Comorbidity 1;Age;Comorbidity 2;Fitness;Past/Current Experience;Time since onset of injury/illness/exacerbation;Behavior Pattern    Comorbidities  HTN; bilat THA    Examination-Activity Limitations  Bathing;Squat;Lift;Stairs;Locomotion Level;Carry;Transfers    Examination-Participation Restrictions  Church;Cleaning;Community Activity;Laundry    Stability/Clinical Decision Making  Evolving/Moderate complexity    Clinical Decision Making  Moderate    Rehab Potential  Good    PT Frequency  2x / week    PT Duration  8 weeks    PT Treatment/Interventions  ADLs/Self Care Home Management;Electrical Stimulation;Therapeutic activities;Patient/family education;Taping;Therapeutic exercise;DME Instruction;Aquatic Therapy;Moist Heat;Stair training;Traction;Ultrasound;Cryotherapy;Functional mobility training;Neuromuscular re-education;Manual techniques;Dry needling;Passive range of motion    PT Next Visit Plan  reassess goals    PT Home Exercise Plan  [id number]    Consulted and Agree with Plan of Care  Patient       Patient will benefit from skilled therapeutic intervention in order to improve the following deficits and impairments:  Pain, Improper body mechanics, Increased fascial restricitons, Abnormal gait, Decreased mobility, Increased muscle spasms, Postural dysfunction, Decreased activity tolerance, Decreased endurance, Decreased range of motion, Decreased strength, Decreased balance, Impaired flexibility, Difficulty walking  Visit Diagnosis: Other abnormalities of gait and mobility  History of falling     Problem List Patient Active Problem List   Diagnosis  Date Noted  .  Chronic low back pain 02/15/2019  . Difficulty walking 02/09/2019  . Acute pain of right shoulder 08/10/2018  . Foot drop 11/23/2017  . Status post shoulder replacement 11/20/2017  . Postmenopausal estrogen deficiency 10/15/2017  . Localized osteoarthritis of right shoulder 10/15/2017  . Difficulty sleeping 10/15/2017  . Acute cystitis without hematuria 07/20/2015  . Bilateral knee pain 06/23/2014  . Lumbar radiculopathy, chronic 06/23/2014  . Moderate aortic stenosis 11/03/2013  . Right bundle branch block 05/04/2013  . Hyperlipidemia 06/29/2012  . GERD (gastroesophageal reflux disease) 07/31/2011  . Hypertension 07/31/2011   Shelton Silvas PT, DPT Ivin Booty, SPT Shelton Silvas 10/06/2019, 12:19 PM  [place] [address] [place], [address] Phone: [phone]   Fax:  [phone]  Name: Robin Alexander MRN: [id number] Date of Birth: Jul 26, 1933

## 2019-10-07 ENCOUNTER — Telehealth: Payer: Self-pay | Admitting: Family Medicine

## 2019-10-07 DIAGNOSIS — E785 Hyperlipidemia, unspecified: Secondary | ICD-10-CM

## 2019-10-07 MED ORDER — SIMVASTATIN 20 MG PO TABS: 20.0000 mg | ORAL_TABLET | Freq: Every evening | ORAL | 3 refills | Status: AC

## 2019-10-07 NOTE — Telephone Encounter (Signed)
Pt needs a new refill on simvastatin (ZOCOR) 20 MG tablet sent to [facility]

## 2019-10-07 NOTE — Telephone Encounter (Signed)
Patients insurance changed and she needed a new RX for her Zocor this was sent to [facility].  Robin Alexander,cma

## 2019-10-11 ENCOUNTER — Other Ambulatory Visit: Payer: Self-pay

## 2019-10-11 ENCOUNTER — Encounter: Payer: Self-pay | Admitting: Physical Therapy

## 2019-10-11 ENCOUNTER — Ambulatory Visit: Payer: PPO | Admitting: Physical Therapy

## 2019-10-11 DIAGNOSIS — Z9181 History of falling: Secondary | ICD-10-CM

## 2019-10-11 DIAGNOSIS — R2689 Other abnormalities of gait and mobility: Secondary | ICD-10-CM

## 2019-10-11 DIAGNOSIS — G8929 Other chronic pain: Secondary | ICD-10-CM

## 2019-10-11 NOTE — Therapy (Cosign Needed Addendum)
[facility] [address] [place], [address] Phone: [phone]   Fax:  [phone]  Physical Therapy Treatment/Progress Note Reporting Period 08/29/20 - 10/11/19  Patient Details  Name: Robin Alexander MRN: [id number] Date of Birth: [birth year] No data recorded  Encounter Date: 10/11/2019  PT End of Session - 10/11/19 1436    Visit Number  10    Number of Visits  17    Date for PT Re-Evaluation  10/26/19    PT Start Time  [id number]    PT Stop Time  1428    PT Time Calculation (min)  43 min    Equipment Utilized During Treatment  Gait belt    Activity Tolerance  Patient tolerated treatment well    Behavior During Therapy  [facility] for tasks assessed/performed       Past Medical History:  Diagnosis Date  . Abnormal CT scan, chest 03/20/2014  . Anemia   . Arthritis    shoulders  . Chicken pox   . Cold agglutinin disease   . Complication of anesthesia 2016   slow to wake up after anesthesia  . Dyspnea    uses inhaler when she gets bronchitis  . GERD (gastroesophageal reflux disease)   . GI bleed 03/16/2018  . Heart murmur   . High cholesterol   . HTN (hypertension)   . Multiple duodenal ulcers   . Pneumonia    spring 2015    Past Surgical History:  Procedure Laterality Date  . ABDOMINAL HYSTERECTOMY     in 50's  . ANTERIOR LAT LUMBAR FUSION Right 02/28/2015   Procedure: ANTERIOR LATERAL LUMBAR FUSION 1 LEVEL;  Surgeon: Phylliss Bob, MD;  Location: [place];  Service: Orthopedics;  Laterality: Right;  Right sided lumbar 3-4 lateral interbody fusion  . APPENDECTOMY  1950  . BACK SURGERY     rod in back  . BICEPT TENODESIS Right 11/20/2017   Procedure: BICEPS TENODESIS;  Surgeon: Leim Fabry, MD;  Location: [facility];  Service: Orthopedics;  Laterality: Right;  . CATARACT EXTRACTION W/ INTRAOCULAR LENS  IMPLANT, BILATERAL Bilateral 2011  . CESAREAN SECTION     2  . COLONOSCOPY WITH PROPOFOL N/A 03/03/2018    Procedure: COLONOSCOPY WITH PROPOFOL;  Surgeon: Jonathon Bellows, MD;  Location: [facility];  Service: Gastroenterology;  Laterality: N/A;  . ESOPHAGOGASTRODUODENOSCOPY (EGD) WITH PROPOFOL N/A 03/03/2018   Procedure: ESOPHAGOGASTRODUODENOSCOPY (EGD) WITH PROPOFOL;  Surgeon: Jonathon Bellows, MD;  Location: [facility];  Service: Gastroenterology;  Laterality: N/A;  . ESOPHAGOGASTRODUODENOSCOPY (EGD) WITH PROPOFOL N/A 04/26/2018   Procedure: ESOPHAGOGASTRODUODENOSCOPY (EGD) WITH PROPOFOL;  Surgeon: Jonathon Bellows, MD;  Location: [facility]COPY;  Service: Gastroenterology;  Laterality: N/A;  . JOINT REPLACEMENT    . REVERSE SHOULDER ARTHROPLASTY Right 11/20/2017   Procedure: REVERSE SHOULDER ARTHROPLASTY;  Surgeon: Leim Fabry, MD;  Location: [facility];  Service: Orthopedics;  Laterality: Right;  . ROTATOR CUFF REPAIR Right    right shoulder   10 YRS  . TOTAL HIP ARTHROPLASTY Bilateral 2005, 2006   Bilateral, [place]    There were no vitals filed for this visit.  Subjective Assessment - 10/11/19 1347    Subjective  Pt notes she "doesn't feel like she is walking good today". Pt HEP is going well and has good adherence. Pt stated goal of feeling more steady on her feet.    Pertinent History  Patient is an 86 year old female presenting with chronic LBP. Patient reports 2 years ago she  had a fall at church that started her LBP and bilat LE weakness.Patient had a fusion of L4-S1 2016. Has had injections for pain over the past 2 years and they "used to work" but are no longer helping her pain. Reports LBP is midline without redicular symptoms into LEs; denies numbness/tingling. Reports pain is sharp in the middle of middle of the spine following prolonged ambulation and standing. Rest helps her pain. Worst: 8/10 best: 0/10. Patient reports she started using SPC about 6 months ago when she noticed she had a "limp" when she walks. She lives with her husband who is physically independent, reports indpendence with  ADLs, does have grab bars and shower seat in bathroom but does not use them. Has has 3 floors to her home, but stays on 1 level. Does have stairs in/out of garage (6 steps) with bilat handrails that she reports no issues using. Patient has a rail on the side of her bed that she says assists her in supine<> sit transfers.  She can drives but prefers to have her husband. She enjoys working in her church and visit her friends, and wants to be able to feel comfortable walking without her cane to complete these community activies. Pt denies N/V, B&B changes, unexplained weight fluctuation, saddle paresthesia, fever, night sweats, or unrelenting night pain at this time.    Limitations  House hold activities;Lifting;Standing;Walking    How long can you sit comfortably?  unlimited    How long can you stand comfortably?  5-21mins    How long can you walk comfortably?  75mins    Diagnostic tests  MRI July 2020: Diffuse lumbar spine spondylosis as described above most severe at L2-3. At L2-3 there is a broad-based disc bulge. Moderate bilateral facet arthropathy. Moderate-severe spinal stenosis and bilateral ateral recess stenosis. Mild bilateral foraminal stenosis. CT September 2020 Moderate spinal stenosis at L2-3 as demonstrated on the priorMRI.    Patient Stated Goals  Walk without a cane; feel more steady on feet        THEREX -5TSTS x2 trials, best time = 16 sec; attempted without UE support, patinet unable to complete without UEs -Lateral step-up on 6" box x10 ea leg; too easy for pt  -Added one riser to green box 2x8 ea direction with seated rest breaks between; pt struggles to keep foot neutral with L step up -Standing airex pad hip ABD with eyes closed and light touch on bar 2x10; no hands on bar less motor control on SL stance for ABD -Split squat 1x10 ea leg with cueing for increasing depth and standing with wider base of support   Unbilled:-6 minute walk test: 400 ft needing to stop at 5:30 min  with 3 standing rest breaks using cane for support                      PT Education - 10/11/19 1435    Education Details  therex form, goal progress    Person(s) Educated  Patient    Methods  Explanation;Verbal cues    Comprehension  Verbalized understanding;Returned demonstration       PT Short Term Goals - 10/11/19 1355      PT SHORT TERM GOAL #1   Title  Pt will be independent with HEP in order to improve strength and decrease back pain in order to improve pain-free function at home and work.    Baseline  08/30/19 HEP given; 10/11/2019 HEP great adherence    Time  4    Period  Weeks    Status  Achieved        PT Long Term Goals - 10/11/19 1356      PT LONG TERM GOAL #1   Title  Patient will increase FOTO score to 51 demonstrate predicted increase in functional mobility to complete ADLs    Baseline  08/30/19 37    Time  12    Period  Weeks    Status  Deferred      PT LONG TERM GOAL #2   Title  Pt will decrease worst back pain as reported on NPRS by at least 2 points in order to demonstrate clinically significant reduction in back pain.    Baseline  08/30/19 8/10; 10/11/2019 seated 0/10, standing 8/10 using pain patch    Time  6    Period  Weeks    Status  On-going      PT LONG TERM GOAL #3   Title  Pt will decrease 5TSTS by at least 3 seconds, without UE support, in order to demonstrate clinically significant improvement in LE strength    Baseline  08/30/19 18.3sec with armrests; 10/11/2019 16.3 second with armrests    Time  6    Period  Weeks    Status  On-going      PT LONG TERM GOAL #4   Title  Pt will increase 6MWT by at least 35m (110ft) in order to demonstrate clinically significant improvement in cardiopulmonary endurance and community ambulation    Baseline  08/30/19 226ft SPC in RUE, 2 seated rest breaks (see gait notes); 10/11/2019 400 ft with cane needed to stop at 5:30 with 3 standing rest breaks    Time  6    Period  Weeks    Status   Achieved      PT LONG TERM GOAL #5   Title  Pt will increase 10MWT by at least 0.13 m/s in order to demonstrate clinically significant improvement in community ambulation.    Baseline  08/30/19 0.72m/s    Time  6    Period  Weeks    Status  Deferred            Plan - 10/11/19 1437    Clinical Impression Statement  PT reassessed goals today and pt making good progress with all goals. Pt reports feeling tired today and not performing her best. Pt increased distance for 6 minute walk, improved time for 5TSTS, but did not meet goal time/distances. Pt at 8/10 pain when in standing, instead of 8/10 pain all the time which is improved, but not optimal. Pt will continue to benefit from PT in order to continue to improve endurance, balance, and power for ADLs. Pt stated goal to "feel more steady on her feet" among goal to discontinue use of cane. PT progressed strength and balance therex with good carryover from pt. PT will continue with LE strengthening and incorporate balance training to work towards achieving goals.    Personal Factors and Comorbidities  Comorbidity 1;Age;Comorbidity 2;Fitness;Past/Current Experience;Time since onset of injury/illness/exacerbation;Behavior Pattern    Comorbidities  HTN; bilat THA    Examination-Activity Limitations  Bathing;Squat;Lift;Stairs;Locomotion Level;Carry;Transfers    Examination-Participation Restrictions  Church;Cleaning;Community Activity;Laundry    Stability/Clinical Decision Making  Evolving/Moderate complexity    Clinical Decision Making  Moderate    Rehab Potential  Good    PT Frequency  2x / week    PT Duration  8 weeks    PT Treatment/Interventions  ADLs/Self Care  Home Management;Electrical Stimulation;Therapeutic activities;Patient/family education;Taping;Therapeutic exercise;DME Instruction;Aquatic Therapy;Moist Heat;Stair training;Traction;Ultrasound;Cryotherapy;Functional mobility training;Neuromuscular re-education;Manual techniques;Dry  needling;Passive range of motion    PT Next Visit Plan  balance and SL strength    PT Home Exercise Plan  [id number]    Consulted and Agree with Plan of Care  Patient       Patient will benefit from skilled therapeutic intervention in order to improve the following deficits and impairments:  Pain, Improper body mechanics, Increased fascial restricitons, Abnormal gait, Decreased mobility, Increased muscle spasms, Postural dysfunction, Decreased activity tolerance, Decreased endurance, Decreased range of motion, Decreased strength, Decreased balance, Impaired flexibility, Difficulty walking  Visit Diagnosis: Other abnormalities of gait and mobility  History of falling  Chronic midline low back pain without sciatica     Problem List Patient Active Problem List   Diagnosis Date Noted  . Chronic low back pain 02/15/2019  . Difficulty walking 02/09/2019  . Acute pain of right shoulder 08/10/2018  . Foot drop 11/23/2017  . Status post shoulder replacement 11/20/2017  . Postmenopausal estrogen deficiency 10/15/2017  . Localized osteoarthritis of right shoulder 10/15/2017  . Difficulty sleeping 10/15/2017  . Acute cystitis without hematuria 07/20/2015  . Bilateral knee pain 06/23/2014  . Lumbar radiculopathy, chronic 06/23/2014  . Moderate aortic stenosis 11/03/2013  . Right bundle branch block 05/04/2013  . Hyperlipidemia 06/29/2012  . GERD (gastroesophageal reflux disease) 07/31/2011  . Hypertension 07/31/2011   Shelton Silvas PT, DPT Ivin Booty, SPT Shelton Silvas 10/11/2019, 4:11 PM  [facility] [address], [place], [address] Phone: [phone]   Fax:  [phone]  Name: Robin Alexander MRN: [id number] Date of Birth: 1933-05-19

## 2019-10-13 ENCOUNTER — Encounter: Payer: PPO | Admitting: Physical Therapy

## 2019-10-18 ENCOUNTER — Encounter: Payer: Self-pay | Admitting: Physical Therapy

## 2019-10-18 ENCOUNTER — Other Ambulatory Visit: Payer: Self-pay

## 2019-10-18 ENCOUNTER — Ambulatory Visit: Payer: PPO | Admitting: Physical Therapy

## 2019-10-18 DIAGNOSIS — R2689 Other abnormalities of gait and mobility: Secondary | ICD-10-CM

## 2019-10-18 DIAGNOSIS — Z9181 History of falling: Secondary | ICD-10-CM

## 2019-10-18 DIAGNOSIS — G8929 Other chronic pain: Secondary | ICD-10-CM

## 2019-10-18 NOTE — Therapy (Addendum)
[facility] [address] [place], [address] Phone: [phone]   Fax:  [phone]  Physical Therapy Treatment  Patient Details  Name: Robin Alexander MRN: [id number] Date of Birth: 30-Dec-1932 No data recorded  Encounter Date: 10/18/2019  PT End of Session - 10/18/19 1444    Visit Number  11    Number of Visits  17    Date for PT Re-Evaluation  10/26/19    PT Start Time  1350    PT Stop Time  1430    PT Time Calculation (min)  40 min    Equipment Utilized During Treatment  Gait belt    Activity Tolerance  Patient tolerated treatment well    Behavior During Therapy  WFL for tasks assessed/performed       Past Medical History:  Diagnosis Date  . Abnormal CT scan, chest 03/20/2014  . Anemia   . Arthritis    shoulders  . Chicken pox   . Cold agglutinin disease   . Complication of anesthesia 2016   slow to wake up after anesthesia  . Dyspnea    uses inhaler when she gets bronchitis  . GERD (gastroesophageal reflux disease)   . GI bleed 03/16/2018  . Heart murmur   . High cholesterol   . HTN (hypertension)   . Multiple duodenal ulcers   . Pneumonia    spring 2015    Past Surgical History:  Procedure Laterality Date  . ABDOMINAL HYSTERECTOMY     in 50's  . ANTERIOR LAT LUMBAR FUSION Right 02/28/2015   Procedure: ANTERIOR LATERAL LUMBAR FUSION 1 LEVEL;  Surgeon: Phylliss Bob, MD;  Location: [place];  Service: Orthopedics;  Laterality: Right;  Right sided lumbar 3-4 lateral interbody fusion  . APPENDECTOMY  1950  . BACK SURGERY     rod in back  . BICEPT TENODESIS Right 11/20/2017   Procedure: BICEPS TENODESIS;  Surgeon: Leim Fabry, MD;  Location: [facility];  Service: Orthopedics;  Laterality: Right;  . CATARACT EXTRACTION W/ INTRAOCULAR LENS  IMPLANT, BILATERAL Bilateral 2011  . CESAREAN SECTION     2  . COLONOSCOPY WITH PROPOFOL N/A 03/03/2018   Procedure: COLONOSCOPY WITH PROPOFOL;  Surgeon:  Jonathon Bellows, MD;  Location: [facility];  Service: Gastroenterology;  Laterality: N/A;  . ESOPHAGOGASTRODUODENOSCOPY (EGD) WITH PROPOFOL N/A 03/03/2018   Procedure: ESOPHAGOGASTRODUODENOSCOPY (EGD) WITH PROPOFOL;  Surgeon: Jonathon Bellows, MD;  Location: [facility];  Service: Gastroenterology;  Laterality: N/A;  . ESOPHAGOGASTRODUODENOSCOPY (EGD) WITH PROPOFOL N/A 04/26/2018   Procedure: ESOPHAGOGASTRODUODENOSCOPY (EGD) WITH PROPOFOL;  Surgeon: Jonathon Bellows, MD;  Location: [facility] ENDOSCOPY;  Service: Gastroenterology;  Laterality: N/A;  . JOINT REPLACEMENT    . REVERSE SHOULDER ARTHROPLASTY Right 11/20/2017   Procedure: REVERSE SHOULDER ARTHROPLASTY;  Surgeon: Leim Fabry, MD;  Location: [facility];  Service: Orthopedics;  Laterality: Right;  . ROTATOR CUFF REPAIR Right    right shoulder   10 YRS  . TOTAL HIP ARTHROPLASTY Bilateral 2005, 2006   Bilateral,     There were no vitals filed for this visit.  Subjective Assessment - 10/18/19 1351    Subjective  Pt feels more fatigued today and decreased tolerance for standing over the weekend. Pt had some trouble with HEP adherence in the past week.    Pertinent History  Patient is an 86 year old female presenting with chronic LBP. Patient reports 2 years ago she had a fall at church that started her LBP and bilat LE  weakness.Patient had a fusion of L4-S1 2016. Has had injections for pain over the past 2 years and they "used to work" but are no longer helping her pain. Reports LBP is midline without redicular symptoms into LEs; denies numbness/tingling. Reports pain is sharp in the middle of middle of the spine following prolonged ambulation and standing. Rest helps her pain. Worst: 8/10 best: 0/10. Patient reports she started using SPC about 6 months ago when she noticed she had a "limp" when she walks. She lives with her husband who is physically independent, reports indpendence with ADLs, does have grab bars and shower seat in bathroom but does  not use them. Has has 3 floors to her home, but stays on 1 level. Does have stairs in/out of garage (6 steps) with bilat handrails that she reports no issues using. Patient has a rail on the side of her bed that she says assists her in supine<> sit transfers.  She can drives but prefers to have her husband. She enjoys working in her church and visit her friends, and wants to be able to feel comfortable walking without her cane to complete these community activies. Pt denies N/V, B&B changes, unexplained weight fluctuation, saddle paresthesia, fever, night sweats, or unrelenting night pain at this time.    Limitations  House hold activities;Lifting;Standing;Walking    How long can you sit comfortably?  unlimited    How long can you stand comfortably?  5-88mins    How long can you walk comfortably?  11mins    Diagnostic tests  MRI July 2020: Diffuse lumbar spine spondylosis as described above most severe at L2-3. At L2-3 there is a broad-based disc bulge. Moderate bilateral facet arthropathy. Moderate-severe spinal stenosis and bilateral ateral recess stenosis. Mild bilateral foraminal stenosis. CT September 2020 Moderate spinal stenosis at L2-3 as demonstrated on the priorMRI.    Patient Stated Goals  Walk without a cane; feel more steady on feet       THEREX -NuStep L3 seat 4 for gentle strengthening and muscular endurance for 5 minutes -Tandem walking forwards/backwards on airex pad using light touch x3; 2x4 no touch on bar for second set -Mini split squat with front foot on airex pad 2x10; heavy cueing for keeping on forefoot (back foot); pt reports some pain in L knee when that foot is behind Gait Training  -3x100 ft obstacle course, not using cane ; weaving through 3 cones, 3 hurdles, 2 set of stairs: reciprocal ascending, step to descending; second set was able to improve to reciprocal descending: seated breaks between                          PT Education - 10/18/19  1444    Education Details  therex form    Person(s) Educated  Patient    Methods  Explanation;Demonstration;Verbal cues    Comprehension  Verbalized understanding       PT Short Term Goals - 10/11/19 1355      PT SHORT TERM GOAL #1   Title  Pt will be independent with HEP in order to improve strength and decrease back pain in order to improve pain-free function at home and work.    Baseline  08/30/19 HEP given; 10/11/2019 HEP great adherence    Time  4    Period  Weeks    Status  Achieved        PT Long Term Goals - 10/11/19 1356      PT  LONG TERM GOAL #1   Title  Patient will increase FOTO score to 51 demonstrate predicted increase in functional mobility to complete ADLs    Baseline  08/30/19 37    Time  12    Period  Weeks    Status  Deferred      PT LONG TERM GOAL #2   Title  Pt will decrease worst back pain as reported on NPRS by at least 2 points in order to demonstrate clinically significant reduction in back pain.    Baseline  08/30/19 8/10; 10/11/2019 seated 0/10, standing 8/10 using pain patch    Time  6    Period  Weeks    Status  On-going      PT LONG TERM GOAL #3   Title  Pt will decrease 5TSTS by at least 3 seconds, without UE support, in order to demonstrate clinically significant improvement in LE strength    Baseline  08/30/19 18.3sec with armrests; 10/11/2019 16.3 second with armrests    Time  6    Period  Weeks    Status  On-going      PT LONG TERM GOAL #4   Title  Pt will increase 6MWT by at least 75m (147ft) in order to demonstrate clinically significant improvement in cardiopulmonary endurance and community ambulation    Baseline  08/30/19 269ft SPC in RUE, 2 seated rest breaks (see gait notes); 10/11/2019 400 ft with cane needed to stop at 5:30 with 3 standing rest breaks    Time  6    Period  Weeks    Status  Achieved      PT LONG TERM GOAL #5   Title  Pt will increase 10MWT by at least 0.13 m/s in order to demonstrate clinically significant  improvement in community ambulation.    Baseline  08/30/19 0.46m/s    Time  6    Period  Weeks    Status  Deferred            Plan - 10/18/19 1445    Clinical Impression Statement  PT progressed strength and balance therex today with good carryover from pt. Despite feeling tired, pt had good motor control during high level balance activity. Pt continues to doubt abilities despite reassurance from PT, but will continue to work on this. PT plans to continue with progressions as needed.    Personal Factors and Comorbidities  Comorbidity 1;Age;Comorbidity 2;Fitness;Past/Current Experience;Time since onset of injury/illness/exacerbation;Behavior Pattern    Comorbidities  HTN; bilat THA    Examination-Activity Limitations  Bathing;Squat;Lift;Stairs;Locomotion Level;Carry;Transfers    Examination-Participation Restrictions  Church;Cleaning;Community Activity;Laundry    Stability/Clinical Decision Making  Evolving/Moderate complexity    Clinical Decision Making  Moderate    Rehab Potential  Good    PT Frequency  2x / week    PT Duration  8 weeks    PT Treatment/Interventions  ADLs/Self Care Home Management;Electrical Stimulation;Therapeutic activities;Patient/family education;Taping;Therapeutic exercise;DME Instruction;Aquatic Therapy;Moist Heat;Stair training;Traction;Ultrasound;Cryotherapy;Functional mobility training;Neuromuscular re-education;Manual techniques;Dry needling;Passive range of motion    PT Next Visit Plan  SL; STS without hands    PT Home Exercise Plan  [id number]    Consulted and Agree with Plan of Care  Patient       Patient will benefit from skilled therapeutic intervention in order to improve the following deficits and impairments:  Pain, Improper body mechanics, Increased fascial restricitons, Abnormal gait, Decreased mobility, Increased muscle spasms, Postural dysfunction, Decreased activity tolerance, Decreased endurance, Decreased range of motion, Decreased strength,  Decreased balance, Impaired  flexibility, Difficulty walking  Visit Diagnosis: Other abnormalities of gait and mobility  History of falling  Chronic midline low back pain without sciatica     Problem List Patient Active Problem List   Diagnosis Date Noted  . Chronic low back pain 02/15/2019  . Difficulty walking 02/09/2019  . Acute pain of right shoulder 08/10/2018  . Foot drop 11/23/2017  . Status post shoulder replacement 11/20/2017  . Postmenopausal estrogen deficiency 10/15/2017  . Localized osteoarthritis of right shoulder 10/15/2017  . Difficulty sleeping 10/15/2017  . Acute cystitis without hematuria 07/20/2015  . Bilateral knee pain 06/23/2014  . Lumbar radiculopathy, chronic 06/23/2014  . Moderate aortic stenosis 11/03/2013  . Right bundle branch block 05/04/2013  . Hyperlipidemia 06/29/2012  . GERD (gastroesophageal reflux disease) 07/31/2011  . Hypertension 07/31/2011   Shelton Silvas PT, DPT Ivin Booty, SPT Shelton Silvas 10/18/2019, 3:51 PM  [facility] [address] [place], [address] Phone: [phone]   Fax:  [phone]  Name: Robin Alexander MRN: [id number] Date of Birth: 01-Jan-1933

## 2019-10-20 ENCOUNTER — Ambulatory Visit: Payer: PPO | Admitting: Physical Therapy

## 2019-10-24 ENCOUNTER — Encounter: Payer: Self-pay | Admitting: Physical Therapy

## 2019-10-24 ENCOUNTER — Ambulatory Visit: Payer: PPO | Admitting: Physical Therapy

## 2019-10-24 ENCOUNTER — Other Ambulatory Visit: Payer: Self-pay

## 2019-10-24 DIAGNOSIS — G8929 Other chronic pain: Secondary | ICD-10-CM

## 2019-10-24 DIAGNOSIS — Z9181 History of falling: Secondary | ICD-10-CM

## 2019-10-24 DIAGNOSIS — R2689 Other abnormalities of gait and mobility: Secondary | ICD-10-CM | POA: Diagnosis not present

## 2019-10-24 NOTE — Therapy (Addendum)
[facility] [address], [place], [address] Phone: [phone]   Fax:  [phone]  Physical Therapy Treatment  Patient Details  Name: Robin Alexander MRN: [id number] Date of Birth: 1933/02/23 No data recorded  Encounter Date: 10/24/2019  PT End of Session - 10/24/19 1537    Visit Number  12    Number of Visits  17    Date for PT Re-Evaluation  10/26/19    PT Start Time  [id number]    PT Stop Time  1345    PT Time Calculation (min)  40 min    Activity Tolerance  Patient tolerated treatment well    Behavior During Therapy  [facility] for tasks assessed/performed       Past Medical History:  Diagnosis Date  . Abnormal CT scan, chest 03/20/2014  . Anemia   . Arthritis    shoulders  . Chicken pox   . Cold agglutinin disease   . Complication of anesthesia 2016   slow to wake up after anesthesia  . Dyspnea    uses inhaler when she gets bronchitis  . GERD (gastroesophageal reflux disease)   . GI bleed 03/16/2018  . Heart murmur   . High cholesterol   . HTN (hypertension)   . Multiple duodenal ulcers   . Pneumonia    spring 2015    Past Surgical History:  Procedure Laterality Date  . ABDOMINAL HYSTERECTOMY     in 50's  . ANTERIOR LAT LUMBAR FUSION Right 02/28/2015   Procedure: ANTERIOR LATERAL LUMBAR FUSION 1 LEVEL;  Surgeon: Phylliss Bob, MD;  Location: [facility];  Service: Orthopedics;  Laterality: Right;  Right sided lumbar 3-4 lateral interbody fusion  . APPENDECTOMY  1950  . BACK SURGERY     rod in back  . BICEPT TENODESIS Right 11/20/2017   Procedure: BICEPS TENODESIS;  Surgeon: Leim Fabry, MD;  Location: [facility];  Service: Orthopedics;  Laterality: Right;  . CATARACT EXTRACTION W/ INTRAOCULAR LENS  IMPLANT, BILATERAL Bilateral 2011  . CESAREAN SECTION     2  . COLONOSCOPY WITH PROPOFOL N/A 03/03/2018   Procedure: COLONOSCOPY WITH PROPOFOL;  Surgeon: Jonathon Bellows, MD;  Location: [facility];  Service:  Gastroenterology;  Laterality: N/A;  . ESOPHAGOGASTRODUODENOSCOPY (EGD) WITH PROPOFOL N/A 03/03/2018   Procedure: ESOPHAGOGASTRODUODENOSCOPY (EGD) WITH PROPOFOL;  Surgeon: Jonathon Bellows, MD;  Location: [facility];  Service: Gastroenterology;  Laterality: N/A;  . ESOPHAGOGASTRODUODENOSCOPY (EGD) WITH PROPOFOL N/A 04/26/2018   Procedure: ESOPHAGOGASTRODUODENOSCOPY (EGD) WITH PROPOFOL;  Surgeon: Jonathon Bellows, MD;  Location: [facility];  Service: Gastroenterology;  Laterality: N/A;  . JOINT REPLACEMENT    . REVERSE SHOULDER ARTHROPLASTY Right 11/20/2017   Procedure: REVERSE SHOULDER ARTHROPLASTY;  Surgeon: Leim Fabry, MD;  Location: [facility];  Service: Orthopedics;  Laterality: Right;  . ROTATOR CUFF REPAIR Right    right shoulder   10 YRS  . TOTAL HIP ARTHROPLASTY Bilateral 2005, 2006   Bilateral, [place]    There were no vitals filed for this visit.  Subjective Assessment - 10/24/19 1311    Subjective  Pt does not report any soreness from last session. Pt reports 10/10 pain today in standing, but in sitting 0/10 pain. Pt HEP has been going well, with good adherence.    Pertinent History  Patient is an 86 year old female presenting with chronic LBP. Patient reports 2 years ago she had a fall at church that started her LBP and bilat LE weakness.Patient had a fusion  of L4-S1 2016. Has had injections for pain over the past 2 years and they "used to work" but are no longer helping her pain. Reports LBP is midline without redicular symptoms into LEs; denies numbness/tingling. Reports pain is sharp in the middle of middle of the spine following prolonged ambulation and standing. Rest helps her pain. Worst: 8/10 best: 0/10. Patient reports she started using SPC about 6 months ago when she noticed she had a "limp" when she walks. She lives with her husband who is physically independent, reports indpendence with ADLs, does have grab bars and shower seat in bathroom but does not use them. Has has 3 floors  to her home, but stays on 1 level. Does have stairs in/out of garage (6 steps) with bilat handrails that she reports no issues using. Patient has a rail on the side of her bed that she says assists her in supine<> sit transfers.  She can drives but prefers to have her husband. She enjoys working in her church and visit her friends, and wants to be able to feel comfortable walking without her cane to complete these community activies. Pt denies N/V, B&B changes, unexplained weight fluctuation, saddle paresthesia, fever, night sweats, or unrelenting night pain at this time.    Limitations  House hold activities;Lifting;Standing;Walking    How long can you sit comfortably?  unlimited    How long can you stand comfortably?  5-26mins    How long can you walk comfortably?  32mins    Diagnostic tests  MRI July 2020: Diffuse lumbar spine spondylosis as described above most severe at L2-3. At L2-3 there is a broad-based disc bulge. Moderate bilateral facet arthropathy. Moderate-severe spinal stenosis and bilateral ateral recess stenosis. Mild bilateral foraminal stenosis. CT September 2020 Moderate spinal stenosis at L2-3 as demonstrated on the priorMRI.    Patient Stated Goals  Walk without a cane; feel more steady on feet       THEREX -Supine lumbar twist 2x12 with 3 sec hold each direction for gentle mobility and pain control  -Supine bridge 3x8,12 second set with RTB above knees with good carryover for hip ABD -Supine marches with RTB above knees 3x12 ea leg with good carryover for core activation and knee drive  -Supine modified thomas stretch 2x30 sec for hip flexor stretch and LB pain control   -Standing squat to mat table with 10# 3x12 with good carryover for depth; no pain currently reported -Standing SL balance with light bilat support of railing 2x30 sec with good carryover; able to stand ~5 sec  At end of session pt does not have any pain in standing; pt says "it wasn't acting up" by the  end                        PT Education - 10/24/19 1437    Education Details  therex form    Person(s) Educated  Patient    Methods  Explanation;Demonstration;Verbal cues;Tactile cues    Comprehension  Verbalized understanding;Returned demonstration       PT Short Term Goals - 10/11/19 1355      PT SHORT TERM GOAL #1   Title  Pt will be independent with HEP in order to improve strength and decrease back pain in order to improve pain-free function at home and work.    Baseline  08/30/19 HEP given; 10/11/2019 HEP great adherence    Time  4    Period  Weeks    Status  Achieved        PT Long Term Goals - 10/11/19 1356      PT LONG TERM GOAL #1   Title  Patient will increase FOTO score to 51 demonstrate predicted increase in functional mobility to complete ADLs    Baseline  08/30/19 37    Time  12    Period  Weeks    Status  Deferred      PT LONG TERM GOAL #2   Title  Pt will decrease worst back pain as reported on NPRS by at least 2 points in order to demonstrate clinically significant reduction in back pain.    Baseline  08/30/19 8/10; 10/11/2019 seated 0/10, standing 8/10 using pain patch    Time  6    Period  Weeks    Status  On-going      PT LONG TERM GOAL #3   Title  Pt will decrease 5TSTS by at least 3 seconds, without UE support, in order to demonstrate clinically significant improvement in LE strength    Baseline  08/30/19 18.3sec with armrests; 10/11/2019 16.3 second with armrests    Time  6    Period  Weeks    Status  On-going      PT LONG TERM GOAL #4   Title  Pt will increase 6MWT by at least 11m (135ft) in order to demonstrate clinically significant improvement in cardiopulmonary endurance and community ambulation    Baseline  08/30/19 249ft SPC in RUE, 2 seated rest breaks (see gait notes); 10/11/2019 400 ft with cane needed to stop at 5:30 with 3 standing rest breaks    Time  6    Period  Weeks    Status  Achieved      PT LONG TERM  GOAL #5   Title  Pt will increase 10MWT by at least 0.13 m/s in order to demonstrate clinically significant improvement in community ambulation.    Baseline  08/30/19 0.41m/s    Time  6    Period  Weeks    Status  Deferred            Plan - 10/24/19 1538    Clinical Impression Statement  Pt was 5 minutes late to session, so PT adjusted session accordingly. Pt reports higher than normal pain levels today in standing activity, so PT adjusted session to focus more on gentle mobility and strength in supine position. Pt responded well to treatment without any major pain flare-ups. PT suggested pt add the gentle stretches to HEP when back becomes aggravated. Pt would like to make a goal of improving bed mobility independently and continue with PT x2/wk. Pt continues to make good improvment with strength and PT will continue with strength progressions as needed.    Personal Factors and Comorbidities  Comorbidity 1;Age;Comorbidity 2;Fitness;Past/Current Experience;Time since onset of injury/illness/exacerbation;Behavior Pattern    Comorbidities  HTN; bilat THA    Examination-Activity Limitations  Bathing;Squat;Lift;Stairs;Locomotion Level;Carry;Transfers    Examination-Participation Restrictions  Church;Cleaning;Community Activity;Laundry    Stability/Clinical Decision Making  Evolving/Moderate complexity    Clinical Decision Making  Moderate    Rehab Potential  Good    PT Frequency  2x / week    PT Duration  8 weeks    PT Treatment/Interventions  ADLs/Self Care Home Management;Electrical Stimulation;Therapeutic activities;Patient/family education;Taping;Therapeutic exercise;DME Instruction;Aquatic Therapy;Moist Heat;Stair training;Traction;Ultrasound;Cryotherapy;Functional mobility training;Neuromuscular re-education;Manual techniques;Dry needling;Passive range of motion    PT Next Visit Plan  SL; STS without hands    PT Home Exercise Plan  [id number]    Consulted and Agree with Plan of Care   Patient       Patient will benefit from skilled therapeutic intervention in order to improve the following deficits and impairments:  Pain, Improper body mechanics, Increased fascial restricitons, Abnormal gait, Decreased mobility, Increased muscle spasms, Postural dysfunction, Decreased activity tolerance, Decreased endurance, Decreased range of motion, Decreased strength, Decreased balance, Impaired flexibility, Difficulty walking  Visit Diagnosis: Other abnormalities of gait and mobility  History of falling  Chronic midline low back pain without sciatica     Problem List Patient Active Problem List   Diagnosis Date Noted  . Chronic low back pain 02/15/2019  . Difficulty walking 02/09/2019  . Acute pain of right shoulder 08/10/2018  . Foot drop 11/23/2017  . Status post shoulder replacement 11/20/2017  . Postmenopausal estrogen deficiency 10/15/2017  . Localized osteoarthritis of right shoulder 10/15/2017  . Difficulty sleeping 10/15/2017  . Acute cystitis without hematuria 07/20/2015  . Bilateral knee pain 06/23/2014  . Lumbar radiculopathy, chronic 06/23/2014  . Moderate aortic stenosis 11/03/2013  . Right bundle branch block 05/04/2013  . Hyperlipidemia 06/29/2012  . GERD (gastroesophageal reflux disease) 07/31/2011  . Hypertension 07/31/2011   Shelton Silvas PT, DPT Ivin Booty, SPT Shelton Silvas 10/24/2019, 4:32 PM  [facility] [address], [place], [address] Phone: [phone]   Fax:  [phone]  Name: Robin Alexander MRN: [id number] Date of Birth: [birth year]

## 2019-10-26 ENCOUNTER — Encounter: Payer: Self-pay | Admitting: Physical Therapy

## 2019-10-26 ENCOUNTER — Other Ambulatory Visit: Payer: Self-pay

## 2019-10-26 ENCOUNTER — Ambulatory Visit: Payer: PPO | Admitting: Physical Therapy

## 2019-10-26 DIAGNOSIS — M545 Low back pain, unspecified: Secondary | ICD-10-CM

## 2019-10-26 DIAGNOSIS — R2689 Other abnormalities of gait and mobility: Secondary | ICD-10-CM | POA: Diagnosis not present

## 2019-10-26 DIAGNOSIS — Z9181 History of falling: Secondary | ICD-10-CM

## 2019-10-26 DIAGNOSIS — G8929 Other chronic pain: Secondary | ICD-10-CM

## 2019-10-26 NOTE — Therapy (Addendum)
[place] [facility] [address] [place], [address] Phone: [phone]   Fax:  [phone]  Physical Therapy Treatment/Progress Note 08/30/2019-10/26/2019  Patient Details  Name: Robin Alexander MRN: [id number] Date of Birth: [birth year] No data recorded  Encounter Date: 10/26/2019  PT End of Session - 10/26/19 1348    Visit Number  13    Number of Visits  29    Date for PT Re-Evaluation  12/09/19    PT Start Time  1300    PT Stop Time  1340    PT Time Calculation (min)  40 min    Activity Tolerance  Patient tolerated treatment well    Behavior During Therapy  [facility] for tasks assessed/performed       Past Medical History:  Diagnosis Date  . Abnormal CT scan, chest 03/20/2014  . Anemia   . Arthritis    shoulders  . Chicken pox   . Cold agglutinin disease   . Complication of anesthesia 2016   slow to wake up after anesthesia  . Dyspnea    uses inhaler when she gets bronchitis  . GERD (gastroesophageal reflux disease)   . GI bleed 03/16/2018  . Heart murmur   . High cholesterol   . HTN (hypertension)   . Multiple duodenal ulcers   . Pneumonia    spring 2015    Past Surgical History:  Procedure Laterality Date  . ABDOMINAL HYSTERECTOMY     in 50's  . ANTERIOR LAT LUMBAR FUSION Right 02/28/2015   Procedure: ANTERIOR LATERAL LUMBAR FUSION 1 LEVEL;  Surgeon: Phylliss Bob, MD;  Location: [facility];  Service: Orthopedics;  Laterality: Right;  Right sided lumbar 3-4 lateral interbody fusion  . APPENDECTOMY  1950  . BACK SURGERY     rod in back  . BICEPT TENODESIS Right 11/20/2017   Procedure: BICEPS TENODESIS;  Surgeon: Leim Fabry, MD;  Location: [facility];  Service: Orthopedics;  Laterality: Right;  . CATARACT EXTRACTION W/ INTRAOCULAR LENS  IMPLANT, BILATERAL Bilateral 2011  . CESAREAN SECTION     2  . COLONOSCOPY WITH PROPOFOL N/A 03/03/2018   Procedure: COLONOSCOPY WITH PROPOFOL;  Surgeon: Jonathon Bellows, MD;   Location: [facility];  Service: Gastroenterology;  Laterality: N/A;  . ESOPHAGOGASTRODUODENOSCOPY (EGD) WITH PROPOFOL N/A 03/03/2018   Procedure: ESOPHAGOGASTRODUODENOSCOPY (EGD) WITH PROPOFOL;  Surgeon: Jonathon Bellows, MD;  Location: [facility]COPY;  Service: Gastroenterology;  Laterality: N/A;  . ESOPHAGOGASTRODUODENOSCOPY (EGD) WITH PROPOFOL N/A 04/26/2018   Procedure: ESOPHAGOGASTRODUODENOSCOPY (EGD) WITH PROPOFOL;  Surgeon: Jonathon Bellows, MD;  [facility];  Service: Gastroenterology;  Laterality: N/A;  . JOINT REPLACEMENT    . REVERSE SHOULDER ARTHROPLASTY Right 11/20/2017   Procedure: REVERSE SHOULDER ARTHROPLASTY;  Surgeon: Leim Fabry, MD;  Location: [facility];  Service: Orthopedics;  Laterality: Right;  . ROTATOR CUFF REPAIR Right    right shoulder   10 YRS  . TOTAL HIP ARTHROPLASTY Bilateral 2005, 2006   Bilateral, [place]    There were no vitals filed for this visit.  Subjective Assessment - 10/26/19 1346    Subjective  Pt does not report any pain today, but still wearing pain patch. Pt says exercises and stretching really improved her pain and made her feel more steady on her feet last week. She still has issues with balance and feeling confident with walking around house/community. Pt HEP going well.    Pertinent History  Patient is an 86 year old female presenting with chronic LBP. Patient  reports 2 years ago she had a fall at church that started her LBP and bilat LE weakness.Patient had a fusion of L4-S1 2016. Has had injections for pain over the past 2 years and they "used to work" but are no longer helping her pain. Reports LBP is midline without redicular symptoms into LEs; denies numbness/tingling. Reports pain is sharp in the middle of middle of the spine following prolonged ambulation and standing. Rest helps her pain. Worst: 8/10 best: 0/10. Patient reports she started using SPC about 6 months ago when she noticed she had a "limp" when she walks. She lives with her  husband who is physically independent, reports indpendence with ADLs, does have grab bars and shower seat in bathroom but does not use them. Has has 3 floors to her home, but stays on 1 level. Does have stairs in/out of garage (6 steps) with bilat handrails that she reports no issues using. Patient has a rail on the side of her bed that she says assists her in supine<> sit transfers.  She can drives but prefers to have her husband. She enjoys working in her church and visit her friends, and wants to be able to feel comfortable walking without her cane to complete these community activies. Pt denies N/V, B&B changes, unexplained weight fluctuation, saddle paresthesia, fever, night sweats, or unrelenting night pain at this time.    Limitations  House hold activities;Lifting;Standing;Walking    How long can you sit comfortably?  unlimited    How long can you stand comfortably?  5-40mins    How long can you walk comfortably?  73mins    Diagnostic tests  MRI July 2020: Diffuse lumbar spine spondylosis as described above most severe at L2-3. At L2-3 there is a broad-based disc bulge. Moderate bilateral facet arthropathy. Moderate-severe spinal stenosis and bilateral ateral recess stenosis. Mild bilateral foraminal stenosis. CT September 2020 Moderate spinal stenosis at L2-3 as demonstrated on the priorMRI.    Patient Stated Goals  Walk without a cane; feel more steady on feet      THEREX -10 MWT x2 -5TSTS x2  -SLS R 6.5 L 5.41 no hand support -SLS with one hand support 25 sec bilat- able to continue  -Airex pad: tandem stance 30 sec bilat, SLS 25 sec light touch on bar bilat -Standing hip abd with YTB 1x10 ea leg -Rev mini lunge 3x6 with standing breaks; PT cueing for increased depth with good carryover, able to maintain motor pattern better than previous sessions -Standing SL calf raise 3x8  with cueing to extend straight up with some carry over, only ~2" clearance  -Squat to mat table with bilat 5# UE  3x10,9,9 with heavy cueing for hip extension at top of motion; patient fatiguing at end of session and unable to complete   HEP review and update with education on rep/set range/frequency/how and when to increase and decrease intensity as needed with verbalized understanding                     PT Education - 10/26/19 1348    Education Details  therex form, goals debrief, new HEP provided    Person(s) Educated  Patient    Methods  Explanation;Demonstration;Handout    Comprehension  Verbalized understanding;Returned demonstration       PT Short Term Goals - 10/11/19 1355      PT SHORT TERM GOAL #1   Title  Pt will be independent with HEP in order to improve strength and decrease  back pain in order to improve pain-free function at home and work.    Baseline  08/30/19 HEP given; 10/11/2019 HEP great adherence    Time  4    Period  Weeks    Status  Achieved        PT Long Term Goals - 10/26/19 1354      PT LONG TERM GOAL #1   Title  Pt will increase 10MWT to 1.2 m/s in order to be classified as a community ambulation.    Baseline  10/26/2019 .70 m/s    Time  8    Period  Weeks    Status  Revised      PT LONG TERM GOAL #2   Title  In 8 weeks pt will increase SLS to 10.6 sec without UE support in order to show age-related norms for SL balance.    Baseline  10/26/2019 R/L 6.5,5.4 sec    Time  8    Period  Weeks    Status  New      PT LONG TERM GOAL #3   Title  In 8 weeks, pt will independently transfer from supine to sitting in order to improve fuctional bed mobility.    Baseline  10/26/2019 needs min assist from PT with cueing, needs rail for at home    Time  8    Period  Weeks    Status  New      PT LONG TERM GOAL #4   Title  In 8 weeks, pt will increase FOTO score to 51 in order to demonstrate predicted functional improvment for ADLs.    Baseline  10/26/2019 37    Time  8    Period  Weeks    Status  On-going            Plan - 10/26/19 1350     Clinical Impression Statement  PT reassessed goals today and added new HEP. Pt achieved goals for 5TSTS time, gait speed, and pain contorl goals. Pt still has trouble with balance and coordination (specifically SLS with no UE support) along with transferring without assistance in bed. Pt can continue to make improvements with gait speed and confidence. Pt will continue to benefit from skilled therapy to incease independence. PT will continue with progressions as needed.    Personal Factors and Comorbidities  Comorbidity 1;Age;Comorbidity 2;Fitness;Past/Current Experience;Time since onset of injury/illness/exacerbation;Behavior Pattern    Comorbidities  HTN; bilat THA    Examination-Activity Limitations  Bathing;Squat;Lift;Stairs;Locomotion Level;Carry;Transfers    Examination-Participation Restrictions  Church;Cleaning;Community Activity;Laundry    Stability/Clinical Decision Making  Evolving/Moderate complexity    Clinical Decision Making  Moderate    Rehab Potential  Good    PT Frequency  2x / week    PT Duration  8 weeks    PT Treatment/Interventions  ADLs/Self Care Home Management;Electrical Stimulation;Therapeutic activities;Patient/family education;Taping;Therapeutic exercise;DME Instruction;Aquatic Therapy;Moist Heat;Stair training;Traction;Ultrasound;Cryotherapy;Functional mobility training;Neuromuscular re-education;Manual techniques;Dry needling;Passive range of motion    PT Next Visit Plan  SLS    PT Home Exercise Plan  [id number]; updated to add reverse lunge, SL balance at counter    Consulted and Agree with Plan of Care  Patient       Patient will benefit from skilled therapeutic intervention in order to improve the following deficits and impairments:  Pain, Improper body mechanics, Increased fascial restricitons, Abnormal gait, Decreased mobility, Increased muscle spasms, Postural dysfunction, Decreased activity tolerance, Decreased endurance, Decreased range of motion, Decreased  strength, Decreased balance, Impaired flexibility, Difficulty walking  Visit  Diagnosis: Other abnormalities of gait and mobility  History of falling  Chronic midline low back pain without sciatica     Problem List Patient Active Problem List   Diagnosis Date Noted  . Chronic low back pain 02/15/2019  . Difficulty walking 02/09/2019  . Acute pain of right shoulder 08/10/2018  . Foot drop 11/23/2017  . Status post shoulder replacement 11/20/2017  . Postmenopausal estrogen deficiency 10/15/2017  . Localized osteoarthritis of right shoulder 10/15/2017  . Difficulty sleeping 10/15/2017  . Acute cystitis without hematuria 07/20/2015  . Bilateral knee pain 06/23/2014  . Lumbar radiculopathy, chronic 06/23/2014  . Moderate aortic stenosis 11/03/2013  . Right bundle branch block 05/04/2013  . Hyperlipidemia 06/29/2012  . GERD (gastroesophageal reflux disease) 07/31/2011  . Hypertension 07/31/2011   Shelton Silvas PT, DPT Ivin Booty, SPT Shelton Silvas 10/26/2019, 3:19 PM  [place] [facility] [address] [place], [address] Phone: [phone]   Fax:  [phone]  Name: Robin Alexander MRN: [id number] Date of Birth: October 06, 1932

## 2019-11-02 ENCOUNTER — Ambulatory Visit: Payer: PPO | Attending: Family Medicine | Admitting: Physical Therapy

## 2019-11-02 ENCOUNTER — Encounter: Payer: Self-pay | Admitting: Physical Therapy

## 2019-11-02 ENCOUNTER — Other Ambulatory Visit: Payer: Self-pay

## 2019-11-02 DIAGNOSIS — R2689 Other abnormalities of gait and mobility: Secondary | ICD-10-CM | POA: Diagnosis not present

## 2019-11-02 DIAGNOSIS — M545 Low back pain, unspecified: Secondary | ICD-10-CM

## 2019-11-02 DIAGNOSIS — Z9181 History of falling: Secondary | ICD-10-CM

## 2019-11-02 DIAGNOSIS — G8929 Other chronic pain: Secondary | ICD-10-CM

## 2019-11-02 NOTE — Therapy (Signed)
[facility] [address] [place], [address] Phone: [phone]   Fax:  [phone]  Physical Therapy Treatment  Patient Details  Name: Robin Alexander MRN: [id number] Date of Birth: Jan 28, 1933 No data recorded  Encounter Date: 11/02/2019  PT End of Session - 11/02/19 1021    Visit Number  14    Number of Visits  29    Date for PT Re-Evaluation  12/09/19    PT Start Time  1008    PT Stop Time  1050    PT Time Calculation (min)  42 min    Equipment Utilized During Treatment  Gait belt    Activity Tolerance  Patient tolerated treatment well    Behavior During Therapy  WFL for tasks assessed/performed       Past Medical History:  Diagnosis Date  . Abnormal CT scan, chest 03/20/2014  . Anemia   . Arthritis    shoulders  . Chicken pox   . Cold agglutinin disease   . Complication of anesthesia 2016   slow to wake up after anesthesia  . Dyspnea    uses inhaler when she gets bronchitis  . GERD (gastroesophageal reflux disease)   . GI bleed 03/16/2018  . Heart murmur   . High cholesterol   . HTN (hypertension)   . Multiple duodenal ulcers   . Pneumonia    spring 2015    Past Surgical History:  Procedure Laterality Date  . ABDOMINAL HYSTERECTOMY     in 50's  . ANTERIOR LAT LUMBAR FUSION Right 02/28/2015   Procedure: ANTERIOR LATERAL LUMBAR FUSION 1 LEVEL;  Surgeon: Phylliss Bob, MD;  Location: [place];  Service: Orthopedics;  Laterality: Right;  Right sided lumbar 3-4 lateral interbody fusion  . APPENDECTOMY  1950  . BACK SURGERY     rod in back  . BICEPT TENODESIS Right 11/20/2017   Procedure: BICEPS TENODESIS;  Surgeon: Leim Fabry, MD;  Location: [facility];  Service: Orthopedics;  Laterality: Right;  . CATARACT EXTRACTION W/ INTRAOCULAR LENS  IMPLANT, BILATERAL Bilateral 2011  . CESAREAN SECTION     2  . COLONOSCOPY WITH PROPOFOL N/A 03/03/2018   Procedure: COLONOSCOPY WITH PROPOFOL;  Surgeon:  Jonathon Bellows, MD;  Location: [facility];  Service: Gastroenterology;  Laterality: N/A;  . ESOPHAGOGASTRODUODENOSCOPY (EGD) WITH PROPOFOL N/A 03/03/2018   Procedure: ESOPHAGOGASTRODUODENOSCOPY (EGD) WITH PROPOFOL;  Surgeon: Jonathon Bellows, MD;  Location: [facility]OSCOPY;  Service: Gastroenterology;  Laterality: N/A;  . ESOPHAGOGASTRODUODENOSCOPY (EGD) WITH PROPOFOL N/A 04/26/2018   Procedure: ESOPHAGOGASTRODUODENOSCOPY (EGD) WITH PROPOFOL;  Surgeon: Jonathon Bellows, MD;  Location: [facility];  Service: Gastroenterology;  Laterality: N/A;  . JOINT REPLACEMENT    . REVERSE SHOULDER ARTHROPLASTY Right 11/20/2017   Procedure: REVERSE SHOULDER ARTHROPLASTY;  Surgeon: Leim Fabry, MD;  Location: [facility];  Service: Orthopedics;  Laterality: Right;  . ROTATOR CUFF REPAIR Right    right shoulder   10 YRS  . TOTAL HIP ARTHROPLASTY Bilateral 2005, 2006   Bilateral, [place]    There were no vitals filed for this visit.  Subjective Assessment - 11/02/19 1010    Subjective  Patient reports no pain today. Patinet reports she feels like she beginning to plateau, feeling like she has decreased balance (though she denies stumbles/falls), and that her LBP increases with standing for any length of time.    Pertinent History  Patient is an 86 year old female presenting with chronic LBP. Patient reports 2 years ago she  had a fall at church that started her LBP and bilat LE weakness.Patient had a fusion of L4-S1 2016. Has had injections for pain over the past 2 years and they "used to work" but are no longer helping her pain. Reports LBP is midline without redicular symptoms into LEs; denies numbness/tingling. Reports pain is sharp in the middle of middle of the spine following prolonged ambulation and standing. Rest helps her pain. Worst: 8/10 best: 0/10. Patient reports she started using SPC about 6 months ago when she noticed she had a "limp" when she walks. She lives with her husband who is physically independent,  reports indpendence with ADLs, does have grab bars and shower seat in bathroom but does not use them. Has has 3 floors to her home, but stays on 1 level. Does have stairs in/out of garage (6 steps) with bilat handrails that she reports no issues using. Patient has a rail on the side of her bed that she says assists her in supine<> sit transfers.  She can drives but prefers to have her husband. She enjoys working in her church and visit her friends, and wants to be able to feel comfortable walking without her cane to complete these community activies. Pt denies N/V, B&B changes, unexplained weight fluctuation, saddle paresthesia, fever, night sweats, or unrelenting night pain at this time.    Limitations  House hold activities;Lifting;Standing;Walking    How long can you sit comfortably?  unlimited    How long can you stand comfortably?  5-67mins    How long can you walk comfortably?  95mins    Diagnostic tests  MRI July 2020: Diffuse lumbar spine spondylosis as described above most severe at L2-3. At L2-3 there is a broad-based disc bulge. Moderate bilateral facet arthropathy. Moderate-severe spinal stenosis and bilateral ateral recess stenosis. Mild bilateral foraminal stenosis. CT September 2020 Moderate spinal stenosis at L2-3 as demonstrated on the priorMRI.    Patient Stated Goals  Walk without a cane; feel more steady on feet    Pain Onset  More than a month ago         THEREX -NuStep L3 seat 4 for gentle strengthening and muscular endurance for 5 minutes; cuing to keep SPM over 60 -Tandem walking forwards/backwards on airex pad using unilateral HHA x3 with increased difficulty with backward walking - SLS <5sec each LE  Over 3 trials - Narrow BOS trunk rotation with bilat UE flex with yellow tball x10 each direction; in semi tandem x10 with R foot in front and x10 with L foot in front  - Square stepping with 6in hurdles on lines 2x clockwise/ 2x counter clockwise HHA with min cuing for large  side step with good carry over  Gait Training  - 248ft with cognitive tasks during 4 hurdle neogtiation (2x 159ft) and head turns vertical/horizontal (155ft) and speed changes over 190ft. Patient with difficulty maintaining speed and step clearance with cognitive tasks, CGA for safety and rest break following.  -Subsequent 183ft with cognitive tasks through 4 hurdles and 5 cone weaves                        PT Education - 11/02/19 1014    Education Details  therex form    Person(s) Educated  Patient    Methods  Explanation;Demonstration;Tactile cues;Verbal cues    Comprehension  Verbalized understanding;Returned demonstration;Verbal cues required;Tactile cues required       PT Short Term Goals - 10/11/19 1355  PT SHORT TERM GOAL #1   Title  Pt will be independent with HEP in order to improve strength and decrease back pain in order to improve pain-free function at home and work.    Baseline  08/30/19 HEP given; 10/11/2019 HEP great adherence    Time  4    Period  Weeks    Status  Achieved        PT Long Term Goals - 10/26/19 1354      PT LONG TERM GOAL #1   Title  Pt will increase 10MWT to 1.2 m/s in order to be classified as a community ambulation.    Baseline  10/26/2019 .70 m/s    Time  8    Period  Weeks    Status  Revised      PT LONG TERM GOAL #2   Title  In 8 weeks pt will increase SLS to 10.6 sec without UE support in order to show age-related norms for SL balance.    Baseline  10/26/2019 R/L 6.5,5.4 sec    Time  8    Period  Weeks    Status  New      PT LONG TERM GOAL #3   Title  In 8 weeks, pt will independently transfer from supine to sitting in order to improve fuctional bed mobility.    Baseline  10/26/2019 needs min assist from PT with cueing, needs rail for at home    Time  8    Period  Weeks    Status  New      PT LONG TERM GOAL #4   Title  In 8 weeks, pt will increase FOTO score to 51 in order to demonstrate predicted  functional improvment for ADLs.    Baseline  10/26/2019 37    Time  8    Period  Weeks    Status  On-going            Plan - 11/02/19 1024    Clinical Impression Statement  PT continued to progress dynamic balance with gait carry over this session with good success. Patient is able to complete all therex with good motivation, good compliance with cuing for technique, and gaurding needed for safety. Though patient reported at beginning of session that she has LBP with standing, but denies reproduction during standing therex. PT suggested if patient is standing for long periods to complete rotations to prevent LBP, patient verbalized understanding. PT will continue progression as able.    Personal Factors and Comorbidities  Comorbidity 1;Age;Comorbidity 2;Fitness;Past/Current Experience;Time since onset of injury/illness/exacerbation;Behavior Pattern    Comorbidities  HTN; bilat THA    Examination-Activity Limitations  Bathing;Squat;Lift;Stairs;Locomotion Level;Carry;Transfers    Examination-Participation Restrictions  Church;Cleaning;Community Activity;Laundry    Stability/Clinical Decision Making  Evolving/Moderate complexity    Clinical Decision Making  Moderate    Rehab Potential  Good    PT Frequency  2x / week    PT Duration  8 weeks    PT Treatment/Interventions  ADLs/Self Care Home Management;Electrical Stimulation;Therapeutic activities;Patient/family education;Taping;Therapeutic exercise;DME Instruction;Aquatic Therapy;Moist Heat;Stair training;Traction;Ultrasound;Cryotherapy;Functional mobility training;Neuromuscular re-education;Manual techniques;Dry needling;Passive range of motion    PT Next Visit Plan  SLS    PT Home Exercise Plan  [id number]; updated to add reverse lunge, SL balance at counter    Consulted and Agree with Plan of Care  Patient       Patient will benefit from skilled therapeutic intervention in order to improve the following deficits and impairments:  Pain,  Improper body mechanics,  Increased fascial restricitons, Abnormal gait, Decreased mobility, Increased muscle spasms, Postural dysfunction, Decreased activity tolerance, Decreased endurance, Decreased range of motion, Decreased strength, Decreased balance, Impaired flexibility, Difficulty walking  Visit Diagnosis: Other abnormalities of gait and mobility  History of falling  Chronic midline low back pain without sciatica     Problem List Patient Active Problem List   Diagnosis Date Noted  . Chronic low back pain 02/15/2019  . Difficulty walking 02/09/2019  . Acute pain of right shoulder 08/10/2018  . Foot drop 11/23/2017  . Status post shoulder replacement 11/20/2017  . Postmenopausal estrogen deficiency 10/15/2017  . Localized osteoarthritis of right shoulder 10/15/2017  . Difficulty sleeping 10/15/2017  . Acute cystitis without hematuria 07/20/2015  . Bilateral knee pain 06/23/2014  . Lumbar radiculopathy, chronic 06/23/2014  . Moderate aortic stenosis 11/03/2013  . Right bundle branch block 05/04/2013  . Hyperlipidemia 06/29/2012  . GERD (gastroesophageal reflux disease) 07/31/2011  . Hypertension 07/31/2011   Shelton Silvas PT, DPT Shelton Silvas 11/02/2019, 11:55 AM  [facility] [address] [place], [address] Phone: [phone]   Fax:  [phone]  Name: AVI FELVER MRN: [id number] Date of Birth: [birth year]

## 2019-11-03 ENCOUNTER — Encounter: Payer: PPO | Admitting: Physical Therapy

## 2019-11-07 ENCOUNTER — Ambulatory Visit: Payer: PPO | Admitting: Physical Therapy

## 2019-11-08 ENCOUNTER — Ambulatory Visit: Payer: PPO | Admitting: Physical Therapy

## 2019-11-08 ENCOUNTER — Other Ambulatory Visit: Payer: Self-pay

## 2019-11-08 ENCOUNTER — Encounter: Payer: Self-pay | Admitting: Physical Therapy

## 2019-11-08 DIAGNOSIS — G8929 Other chronic pain: Secondary | ICD-10-CM

## 2019-11-08 DIAGNOSIS — M545 Low back pain, unspecified: Secondary | ICD-10-CM

## 2019-11-08 DIAGNOSIS — Z9181 History of falling: Secondary | ICD-10-CM

## 2019-11-08 DIAGNOSIS — R2689 Other abnormalities of gait and mobility: Secondary | ICD-10-CM

## 2019-11-08 NOTE — Therapy (Signed)
[facility] [address] [place], [address] Phone: [phone]   Fax:  [phone]  Physical Therapy Treatment  Patient Details  Name: Robin Alexander MRN: [id number] Date of Birth: 02-20-1933 No data recorded  Encounter Date: 11/08/2019  PT End of Session - 11/08/19 1037    Visit Number  15    Number of Visits  29    Date for PT Re-Evaluation  12/09/19    PT Start Time  1030    PT Stop Time  1110    PT Time Calculation (min)  40 min    Equipment Utilized During Treatment  Gait belt    Activity Tolerance  Patient tolerated treatment well    Behavior During Therapy  WFL for tasks assessed/performed       Past Medical History:  Diagnosis Date  . Abnormal CT scan, chest 03/20/2014  . Anemia   . Arthritis    shoulders  . Chicken pox   . Cold agglutinin disease   . Complication of anesthesia 2016   slow to wake up after anesthesia  . Dyspnea    uses inhaler when she gets bronchitis  . GERD (gastroesophageal reflux disease)   . GI bleed 03/16/2018  . Heart murmur   . High cholesterol   . HTN (hypertension)   . Multiple duodenal ulcers   . Pneumonia    spring 2015    Past Surgical History:  Procedure Laterality Date  . ABDOMINAL HYSTERECTOMY     in 50's  . ANTERIOR LAT LUMBAR FUSION Right 02/28/2015   Procedure: ANTERIOR LATERAL LUMBAR FUSION 1 LEVEL;  Surgeon: Phylliss Bob, MD;  Location: [place];  Service: Orthopedics;  Laterality: Right;  Right sided lumbar 3-4 lateral interbody fusion  . APPENDECTOMY  1950  . BACK SURGERY     rod in back  . BICEPT TENODESIS Right 11/20/2017   Procedure: BICEPS TENODESIS;  Surgeon: Leim Fabry, MD;  Location: [facility];  Service: Orthopedics;  Laterality: Right;  . CATARACT EXTRACTION W/ INTRAOCULAR LENS  IMPLANT, BILATERAL Bilateral 2011  . CESAREAN SECTION     2  . COLONOSCOPY WITH PROPOFOL N/A 03/03/2018   Procedure: COLONOSCOPY WITH PROPOFOL;  Surgeon:  Jonathon Bellows, MD;  Location: [facility] END[facility]COPY;  Service: Gastroenterology;  Laterality: N/A;  . ESOPHAGOGASTRODUODENOSCOPY (EGD) WITH PROPOFOL N/A 03/03/2018   Procedure: ESOPHAGOGASTRODUODENOSCOPY (EGD) WITH PROPOFOL;  Surgeon: Jonathon Bellows, MD;  Location: [facility]COPY;  Service: Gastroenterology;  Laterality: N/A;  . ESOPHAGOGASTRODUODENOSCOPY (EGD) WITH PROPOFOL N/A 04/26/2018   Procedure: ESOPHAGOGASTRODUODENOSCOPY (EGD) WITH PROPOFOL;  Surgeon: Jonathon Bellows, MD;  Location: [facility]COPY;  Service: Gastroenterology;  Laterality: N/A;  . JOINT REPLACEMENT    . REVERSE SHOULDER ARTHROPLASTY Right 11/20/2017   Procedure: REVERSE SHOULDER ARTHROPLASTY;  Surgeon: Leim Fabry, MD;  Location: [facility];  Service: Orthopedics;  Laterality: Right;  . ROTATOR CUFF REPAIR Right    right shoulder   10 YRS  . TOTAL HIP ARTHROPLASTY Bilateral 2005, 2006   Bilateral, [place]    There were no vitals filed for this visit.  Subjective Assessment - 11/08/19 1036    Subjective  Reports she has had no pain or falls. Reports she wants to strengthen her legs more, and that she has been doing her HEP.    Pertinent History  Patient is an 86 year old female presenting with chronic LBP. Patient reports 2 years ago she had a fall at church that started her LBP and bilat LE  weakness.Patient had a fusion of L4-S1 2016. Has had injections for pain over the past 2 years and they "used to work" but are no longer helping her pain. Reports LBP is midline without redicular symptoms into LEs; denies numbness/tingling. Reports pain is sharp in the middle of middle of the spine following prolonged ambulation and standing. Rest helps her pain. Worst: 8/10 best: 0/10. Patient reports she started using SPC about 6 months ago when she noticed she had a "limp" when she walks. She lives with her husband who is physically independent, reports indpendence with ADLs, does have grab bars and shower seat in bathroom but does not use them. Has has  3 floors to her home, but stays on 1 level. Does have stairs in/out of garage (6 steps) with bilat handrails that she reports no issues using. Patient has a rail on the side of her bed that she says assists her in supine<> sit transfers.  She can drives but prefers to have her husband. She enjoys working in her church and visit her friends, and wants to be able to feel comfortable walking without her cane to complete these community activies. Pt denies N/V, B&B changes, unexplained weight fluctuation, saddle paresthesia, fever, night sweats, or unrelenting night pain at this time.    Limitations  House hold activities;Lifting;Standing;Walking    How long can you sit comfortably?  unlimited    How long can you stand comfortably?  5-39mins    How long can you walk comfortably?  72mins    Diagnostic tests  MRI July 2020: Diffuse lumbar spine spondylosis as described above most severe at L2-3. At L2-3 there is a broad-based disc bulge. Moderate bilateral facet arthropathy. Moderate-severe spinal stenosis and bilateral ateral recess stenosis. Mild bilateral foraminal stenosis. CT September 2020 Moderate spinal stenosis at L2-3 as demonstrated on the priorMRI.    Patient Stated Goals  Walk without a cane; feel more steady on feet    Pain Onset  More than a month ago       THEREX -NuStep L4 35mins L3 44mins seat 4  cuing to keep SPM over 60 - Squat with chair behind for safety x10 with cuing to prevent ant tibial translation with good carry over; with 6# DB in front 2x 10 with increased cuing needed to prevent forward lean - MATRIX hip abd 25# 3x 10 bilat with cuing for eccentric control with good carry over  - Reverse lunge to hip flex 2x 10 bilat with visual target for hip flex aiding in increased motion  - Leg press 25# x12; 35# 2x 12 with min cuing for full ROM with good carry over - Heel raise 3x 08/16/19 with cuing for eccentric lower with good carry  over                             PT Education - 11/08/19 1037    Education Details  therex foirm    Person(s) Educated  Patient    Methods  Explanation;Demonstration;Verbal cues    Comprehension  Verbalized understanding;Returned demonstration;Verbal cues required       PT Short Term Goals - 10/11/19 1355      PT SHORT TERM GOAL #1   Title  Pt will be independent with HEP in order to improve strength and decrease back pain in order to improve pain-free function at home and work.    Baseline  08/30/19 HEP given; 10/11/2019 HEP great adherence  Time  4    Period  Weeks    Status  Achieved        PT Long Term Goals - 10/26/19 1354      PT LONG TERM GOAL #1   Title  Pt will increase 10MWT to 1.2 m/s in order to be classified as a community ambulation.    Baseline  10/26/2019 .70 m/s    Time  8    Period  Weeks    Status  Revised      PT LONG TERM GOAL #2   Title  In 8 weeks pt will increase SLS to 10.6 sec without UE support in order to show age-related norms for SL balance.    Baseline  10/26/2019 R/L 6.5,5.4 sec    Time  8    Period  Weeks    Status  New      PT LONG TERM GOAL #3   Title  In 8 weeks, pt will independently transfer from supine to sitting in order to improve fuctional bed mobility.    Baseline  10/26/2019 needs min assist from PT with cueing, needs rail for at home    Time  8    Period  Weeks    Status  New      PT LONG TERM GOAL #4   Title  In 8 weeks, pt will increase FOTO score to 51 in order to demonstrate predicted functional improvment for ADLs.    Baseline  10/26/2019 37    Time  8    Period  Weeks    Status  On-going            Plan - 11/08/19 1105    Clinical Impression Statement  PT continued regression for increased LE strength and endurance with good success. PT continued therex which require balance in varied BOS with continued success. Patient is able to comply with cuing for proper technique and is  motivated throughout session. Through session PT discovered R wrist facciculation, which patient reports she can "stop if she is thinking about it". Tremor does not worsen with intention, patient deies diploplia, tinnitus, other tremors, and short or long term memory.recall changes. PT will continue progression as able, and monitor these symptoms.    Personal Factors and Comorbidities  Comorbidity 1;Age;Comorbidity 2;Fitness;Past/Current Experience;Time since onset of injury/illness/exacerbation;Behavior Pattern    Comorbidities  HTN; bilat THA    Examination-Activity Limitations  Bathing;Squat;Lift;Stairs;Locomotion Level;Carry;Transfers    Examination-Participation Restrictions  Church;Cleaning;Community Activity;Laundry    Stability/Clinical Decision Making  Evolving/Moderate complexity    Clinical Decision Making  Moderate    Rehab Potential  Good    PT Frequency  2x / week    PT Duration  8 weeks    PT Treatment/Interventions  ADLs/Self Care Home Management;Electrical Stimulation;Therapeutic activities;Patient/family education;Taping;Therapeutic exercise;DME Instruction;Aquatic Therapy;Moist Heat;Stair training;Traction;Ultrasound;Cryotherapy;Functional mobility training;Neuromuscular re-education;Manual techniques;Dry needling;Passive range of motion    PT Next Visit Plan  SLS    PT Home Exercise Plan  [id number]; updated to add reverse lunge, SL balance at counter    Consulted and Agree with Plan of Care  Patient       Patient will benefit from skilled therapeutic intervention in order to improve the following deficits and impairments:  Pain, Improper body mechanics, Increased fascial restricitons, Abnormal gait, Decreased mobility, Increased muscle spasms, Postural dysfunction, Decreased activity tolerance, Decreased endurance, Decreased range of motion, Decreased strength, Decreased balance, Impaired flexibility, Difficulty walking  Visit Diagnosis: Other abnormalities of gait and  mobility  History of  falling  Chronic midline low back pain without sciatica     Problem List Patient Active Problem List   Diagnosis Date Noted  . Chronic low back pain 02/15/2019  . Difficulty walking 02/09/2019  . Acute pain of right shoulder 08/10/2018  . Foot drop 11/23/2017  . Status post shoulder replacement 11/20/2017  . Postmenopausal estrogen deficiency 10/15/2017  . Localized osteoarthritis of right shoulder 10/15/2017  . Difficulty sleeping 10/15/2017  . Acute cystitis without hematuria 07/20/2015  . Bilateral knee pain 06/23/2014  . Lumbar radiculopathy, chronic 06/23/2014  . Moderate aortic stenosis 11/03/2013  . Right bundle branch block 05/04/2013  . Hyperlipidemia 06/29/2012  . GERD (gastroesophageal reflux disease) 07/31/2011  . Hypertension 07/31/2011   Shelton Silvas PT, DPT Shelton Silvas 11/08/2019, 11:34 AM  [facility] [address] [place], [address] Phone: [phone]   Fax:  [phone]  Name: Robin Alexander MRN: [id number] Date of Birth: [birth year]

## 2019-11-10 ENCOUNTER — Ambulatory Visit: Payer: PPO | Admitting: Physical Therapy

## 2019-11-11 ENCOUNTER — Encounter: Payer: PPO | Admitting: Physical Therapy

## 2019-11-16 ENCOUNTER — Other Ambulatory Visit: Payer: Self-pay

## 2019-11-16 ENCOUNTER — Encounter: Payer: Self-pay | Admitting: Physical Therapy

## 2019-11-16 ENCOUNTER — Ambulatory Visit: Payer: PPO | Admitting: Physical Therapy

## 2019-11-16 DIAGNOSIS — R2689 Other abnormalities of gait and mobility: Secondary | ICD-10-CM | POA: Diagnosis not present

## 2019-11-16 DIAGNOSIS — Z9181 History of falling: Secondary | ICD-10-CM

## 2019-11-16 DIAGNOSIS — M545 Low back pain, unspecified: Secondary | ICD-10-CM

## 2019-11-16 DIAGNOSIS — G8929 Other chronic pain: Secondary | ICD-10-CM

## 2019-11-16 NOTE — Therapy (Signed)
[facility] [address] [place], [address] Phone: [phone]   Fax:  [phone]  Physical Therapy Treatment  Patient Details  Name: Robin Alexander MRN: [id number] Date of Birth: [birth year] No data recorded  Encounter Date: 11/16/2019  PT End of Session - 11/16/19 1350    Number of Visits  29    Date for PT Re-Evaluation  12/09/19    PT Start Time  0145    PT Stop Time  0230    PT Time Calculation (min)  45 min    Equipment Utilized During Treatment  Gait belt    Activity Tolerance  Patient tolerated treatment well    Behavior During Therapy  WFL for tasks assessed/performed       Past Medical History:  Diagnosis Date  . Abnormal CT scan, chest 03/20/2014  . Anemia   . Arthritis    shoulders  . Chicken pox   . Cold agglutinin disease   . Complication of anesthesia 2016   slow to wake up after anesthesia  . Dyspnea    uses inhaler when she gets bronchitis  . GERD (gastroesophageal reflux disease)   . GI bleed 03/16/2018  . Heart murmur   . High cholesterol   . HTN (hypertension)   . Multiple duodenal ulcers   . Pneumonia    spring 2015    Past Surgical History:  Procedure Laterality Date  . ABDOMINAL HYSTERECTOMY     in 50's  . ANTERIOR LAT LUMBAR FUSION Right 02/28/2015   Procedure: ANTERIOR LATERAL LUMBAR FUSION 1 LEVEL;  Surgeon: Phylliss Bob, MD;  Location: [place];  Service: Orthopedics;  Laterality: Right;  Right sided lumbar 3-4 lateral interbody fusion  . APPENDECTOMY  1950  . BACK SURGERY     rod in back  . BICEPT TENODESIS Right 11/20/2017   Procedure: BICEPS TENODESIS;  Surgeon: Leim Fabry, MD;  Location: [facility];  Service: Orthopedics;  Laterality: Right;  . CATARACT EXTRACTION W/ INTRAOCULAR LENS  IMPLANT, BILATERAL Bilateral 2011  . CESAREAN SECTION     2  . COLONOSCOPY WITH PROPOFOL N/A 03/03/2018   Procedure: COLONOSCOPY WITH PROPOFOL;  Surgeon: Jonathon Bellows, MD;   Location: [facility];  Service: Gastroenterology;  Laterality: N/A;  . ESOPHAGOGASTRODUODENOSCOPY (EGD) WITH PROPOFOL N/A 03/03/2018   Procedure: ESOPHAGOGASTRODUODENOSCOPY (EGD) WITH PROPOFOL;  Surgeon: Jonathon Bellows, MD;  Location: [facility];  Service: Gastroenterology;  Laterality: N/A;  . ESOPHAGOGASTRODUODENOSCOPY (EGD) WITH PROPOFOL N/A 04/26/2018   Procedure: ESOPHAGOGASTRODUODENOSCOPY (EGD) WITH PROPOFOL;  Surgeon: Jonathon Bellows, MD;  Location: [facility];  Service: Gastroenterology;  Laterality: N/A;  . JOINT REPLACEMENT    . REVERSE SHOULDER ARTHROPLASTY Right 11/20/2017   Procedure: REVERSE SHOULDER ARTHROPLASTY;  Surgeon: Leim Fabry, MD;  Location: [facility];  Service: Orthopedics;  Laterality: Right;  . ROTATOR CUFF REPAIR Right    right shoulder   10 YRS  . TOTAL HIP ARTHROPLASTY Bilateral 2005, 2006   Bilateral, [place]    There were no vitals filed for this visit.  Subjective Assessment - 11/16/19 1347    Subjective  Reports she was sore following last week, but felt good overall. She reports no pain today, or falls since last visit. Compliance with HEP    Pertinent History  Patient is an 86 year old female presenting with chronic LBP. Patient reports 2 years ago she had a fall at church that started her LBP and bilat LE weakness.Patient had a fusion of L4-S1 2016.  Has had injections for pain over the past 2 years and they "used to work" but are no longer helping her pain. Reports LBP is midline without redicular symptoms into LEs; denies numbness/tingling. Reports pain is sharp in the middle of middle of the spine following prolonged ambulation and standing. Rest helps her pain. Worst: 8/10 best: 0/10. Patient reports she started using SPC about 6 months ago when she noticed she had a "limp" when she walks. She lives with her husband who is physically independent, reports indpendence with ADLs, does have grab bars and shower seat in bathroom but does not use them. Has has  3 floors to her home, but stays on 1 level. Does have stairs in/out of garage (6 steps) with bilat handrails that she reports no issues using. Patient has a rail on the side of her bed that she says assists her in supine<> sit transfers.  She can drives but prefers to have her husband. She enjoys working in her church and visit her friends, and wants to be able to feel comfortable walking without her cane to complete these community activies. Pt denies N/V, B&B changes, unexplained weight fluctuation, saddle paresthesia, fever, night sweats, or unrelenting night pain at this time.    Limitations  House hold activities;Lifting;Standing;Walking    How long can you sit comfortably?  unlimited    How long can you stand comfortably?  5-70mins    How long can you walk comfortably?  66mins    Diagnostic tests  MRI July 2020: Diffuse lumbar spine spondylosis as described above most severe at L2-3. At L2-3 there is a broad-based disc bulge. Moderate bilateral facet arthropathy. Moderate-severe spinal stenosis and bilateral ateral recess stenosis. Mild bilateral foraminal stenosis. CT September 2020 Moderate spinal stenosis at L2-3 as demonstrated on the priorMRI.    Patient Stated Goals  Walk without a cane; feel more steady on feet    Pain Onset  More than a month ago       THEREX -NuStep L3 75mins seat 4  cuing to keep SPM over 60 - Squat with chair behind for safety with 6# DB in front 3x 10 with increased cuing needed to prevent forward lean - Standing on foam ball toss/catch 3x 10 with patient needing wide BOS to maintain balance, chair behind for safety - MATRIX hip abd 10# 2x 10; 25# x8 each LE bilat with cuing for eccentric control with good carry over  (unable to complete with 25# for first 2 sets) - Leg press 35# 4x 10 with min cuing for full ROM with good carry over                          PT Education - 11/16/19 1350    Education Details  therex form    Person(s)  Educated  Patient    Methods  Explanation;Demonstration;Tactile cues;Verbal cues    Comprehension  Verbalized understanding;Returned demonstration;Verbal cues required;Tactile cues required       PT Short Term Goals - 10/11/19 1355      PT SHORT TERM GOAL #1   Title  Pt will be independent with HEP in order to improve strength and decrease back pain in order to improve pain-free function at home and work.    Baseline  08/30/19 HEP given; 10/11/2019 HEP great adherence    Time  4    Period  Weeks    Status  Achieved        PT  Long Term Goals - 10/26/19 1354      PT LONG TERM GOAL #1   Title  Pt will increase 10MWT to 1.2 m/s in order to be classified as a community ambulation.    Baseline  10/26/2019 .70 m/s    Time  8    Period  Weeks    Status  Revised      PT LONG TERM GOAL #2   Title  In 8 weeks pt will increase SLS to 10.6 sec without UE support in order to show age-related norms for SL balance.    Baseline  10/26/2019 R/L 6.5,5.4 sec    Time  8    Period  Weeks    Status  New      PT LONG TERM GOAL #3   Title  In 8 weeks, pt will independently transfer from supine to sitting in order to improve fuctional bed mobility.    Baseline  10/26/2019 needs min assist from PT with cueing, needs rail for at home    Time  8    Period  Weeks    Status  New      PT LONG TERM GOAL #4   Title  In 8 weeks, pt will increase FOTO score to 51 in order to demonstrate predicted functional improvment for ADLs.    Baseline  10/26/2019 37    Time  8    Period  Weeks    Status  On-going            Plan - 11/16/19 1351    Clinical Impression Statement  PT continued therex progression for increased balance and LE strength with good success. Patient is able to complete all therex with proper technique following some cuing and encouragement. CGA needed for safety. PT will continue progression as able.    Personal Factors and Comorbidities  Comorbidity 1;Age;Comorbidity  2;Fitness;Past/Current Experience;Time since onset of injury/illness/exacerbation;Behavior Pattern    Comorbidities  HTN; bilat THA    Examination-Activity Limitations  Bathing;Squat;Lift;Stairs;Locomotion Level;Carry;Transfers    Examination-Participation Restrictions  Church;Cleaning;Community Activity;Laundry    Stability/Clinical Decision Making  Evolving/Moderate complexity    Clinical Decision Making  Moderate    Rehab Potential  Good    PT Frequency  2x / week    PT Duration  8 weeks    PT Treatment/Interventions  ADLs/Self Care Home Management;Electrical Stimulation;Therapeutic activities;Patient/family education;Taping;Therapeutic exercise;DME Instruction;Aquatic Therapy;Moist Heat;Stair training;Traction;Ultrasound;Cryotherapy;Functional mobility training;Neuromuscular re-education;Manual techniques;Dry needling;Passive range of motion    PT Next Visit Plan  SLS    PT Home Exercise Plan  [email or web address]; updated to add reverse lunge, SL balance at counter    Consulted and Agree with Plan of Care  Patient       Patient will benefit from skilled therapeutic intervention in order to improve the following deficits and impairments:  Pain, Improper body mechanics, Increased fascial restricitons, Abnormal gait, Decreased mobility, Increased muscle spasms, Postural dysfunction, Decreased activity tolerance, Decreased endurance, Decreased range of motion, Decreased strength, Decreased balance, Impaired flexibility, Difficulty walking  Visit Diagnosis: Other abnormalities of gait and mobility  History of falling  Chronic midline low back pain without sciatica     Problem List Patient Active Problem List   Diagnosis Date Noted  . Chronic low back pain 02/15/2019  . Difficulty walking 02/09/2019  . Acute pain of right shoulder 08/10/2018  . Foot drop 11/23/2017  . Status post shoulder replacement 11/20/2017  . Postmenopausal estrogen deficiency 10/15/2017  . Localized osteoarthritis of  right shoulder 10/15/2017  .  Difficulty sleeping 10/15/2017  . Acute cystitis without hematuria 07/20/2015  . Bilateral knee pain 06/23/2014  . Lumbar radiculopathy, chronic 06/23/2014  . Moderate aortic stenosis 11/03/2013  . Right bundle branch block 05/04/2013  . Hyperlipidemia 06/29/2012  . GERD (gastroesophageal reflux disease) 07/31/2011  . Hypertension 07/31/2011   Shelton Silvas PT, DPT Shelton Silvas 11/16/2019, 2:55 PM  [facility] [address] [place], [address] Phone: [phone]   Fax:  [phone]  Name: Robin Alexander MRN: [id number] Date of Birth: 1933/03/14

## 2019-11-23 ENCOUNTER — Ambulatory Visit: Payer: PPO | Admitting: Physical Therapy

## 2019-11-23 ENCOUNTER — Other Ambulatory Visit: Payer: Self-pay

## 2019-11-23 ENCOUNTER — Encounter: Payer: Self-pay | Admitting: Physical Therapy

## 2019-11-23 DIAGNOSIS — R2689 Other abnormalities of gait and mobility: Secondary | ICD-10-CM

## 2019-11-23 DIAGNOSIS — Z9181 History of falling: Secondary | ICD-10-CM

## 2019-11-23 DIAGNOSIS — G8929 Other chronic pain: Secondary | ICD-10-CM

## 2019-11-23 NOTE — Therapy (Signed)
[facility] [address], [place], [address] Phone: [phone]   Fax:  [phone]  Physical Therapy Treatment  Patient Details  Name: Robin Alexander MRN: [id number] Date of Birth: [birth year] No data recorded  Encounter Date: 11/23/2019  PT End of Session - 11/23/19 1442    Visit Number  17    Number of Visits  29    Date for PT Re-Evaluation  12/09/19    PT Start Time  0230    PT Stop Time  0310    PT Time Calculation (min)  40 min    Equipment Utilized During Treatment  Gait belt    Activity Tolerance  Patient tolerated treatment well    Behavior During Therapy  WFL for tasks assessed/performed       Past Medical History:  Diagnosis Date  . Abnormal CT scan, chest 03/20/2014  . Anemia   . Arthritis    shoulders  . Chicken pox   . Cold agglutinin disease   . Complication of anesthesia 2016   slow to wake up after anesthesia  . Dyspnea    uses inhaler when she gets bronchitis  . GERD (gastroesophageal reflux disease)   . GI bleed 03/16/2018  . Heart murmur   . High cholesterol   . HTN (hypertension)   . Multiple duodenal ulcers   . Pneumonia    spring 2015    Past Surgical History:  Procedure Laterality Date  . ABDOMINAL HYSTERECTOMY     in 50's  . ANTERIOR LAT LUMBAR FUSION Right 02/28/2015   Procedure: ANTERIOR LATERAL LUMBAR FUSION 1 LEVEL;  Surgeon: Phylliss Bob, MD;  Location: [facility];  Service: Orthopedics;  Laterality: Right;  Right sided lumbar 3-4 lateral interbody fusion  . APPENDECTOMY  1950  . BACK SURGERY     rod in back  . BICEPT TENODESIS Right 11/20/2017   Procedure: BICEPS TENODESIS;  Surgeon: Leim Fabry, MD;  Location: [facility];  Service: Orthopedics;  Laterality: Right;  . CATARACT EXTRACTION W/ INTRAOCULAR LENS  IMPLANT, BILATERAL Bilateral 2011  . CESAREAN SECTION     2  . COLONOSCOPY WITH PROPOFOL N/A 03/03/2018   Procedure: COLONOSCOPY WITH PROPOFOL;  Surgeon:  Jonathon Bellows, MD;  Location: [facility];  Service: Gastroenterology;  Laterality: N/A;  . ESOPHAGOGASTRODUODENOSCOPY (EGD) WITH PROPOFOL N/A 03/03/2018   Procedure: ESOPHAGOGASTRODUODENOSCOPY (EGD) WITH PROPOFOL;  Surgeon: Jonathon Bellows, MD;  Location: [facility];  Service: Gastroenterology;  Laterality: N/A;  . ESOPHAGOGASTRODUODENOSCOPY (EGD) WITH PROPOFOL N/A 04/26/2018   Procedure: ESOPHAGOGASTRODUODENOSCOPY (EGD) WITH PROPOFOL;  Surgeon: Jonathon Bellows, MD;  Location: [facility]COPY;  Service: Gastroenterology;  Laterality: N/A;  . JOINT REPLACEMENT    . REVERSE SHOULDER ARTHROPLASTY Right 11/20/2017   Procedure: REVERSE SHOULDER ARTHROPLASTY;  Surgeon: Leim Fabry, MD;  Location: [facility];  Service: Orthopedics;  Laterality: Right;  . ROTATOR CUFF REPAIR Right    right shoulder   10 YRS  . TOTAL HIP ARTHROPLASTY Bilateral 2005, 2006   Bilateral, [place]    There were no vitals filed for this visit.  Subjective Assessment - 11/23/19 1433    Subjective  Reports no pain in the hip today. Compliance with HEP    Pertinent History  Patient is an 86 year old female presenting with chronic LBP. Patient reports 2 years ago she had a fall at church that started her LBP and bilat LE weakness.Patient had a fusion of L4-S1 2016. Has had injections for pain over the  past 2 years and they "used to work" but are no longer helping her pain. Reports LBP is midline without redicular symptoms into LEs; denies numbness/tingling. Reports pain is sharp in the middle of middle of the spine following prolonged ambulation and standing. Rest helps her pain. Worst: 8/10 best: 0/10. Patient reports she started using SPC about 6 months ago when she noticed she had a "limp" when she walks. She lives with her husband who is physically independent, reports indpendence with ADLs, does have grab bars and shower seat in bathroom but does not use them. Has has 3 floors to her home, but stays on 1 level. Does have stairs in/out  of garage (6 steps) with bilat handrails that she reports no issues using. Patient has a rail on the side of her bed that she says assists her in supine<> sit transfers.  She can drives but prefers to have her husband. She enjoys working in her church and visit her friends, and wants to be able to feel comfortable walking without her cane to complete these community activies. Pt denies N/V, B&B changes, unexplained weight fluctuation, saddle paresthesia, fever, night sweats, or unrelenting night pain at this time.    Limitations  House hold activities;Lifting;Standing;Walking    How long can you sit comfortably?  unlimited    How long can you stand comfortably?  5-5mins    How long can you walk comfortably?  71mins    Diagnostic tests  MRI July 2020: Diffuse lumbar spine spondylosis as described above most severe at L2-3. At L2-3 there is a broad-based disc bulge. Moderate bilateral facet arthropathy. Moderate-severe spinal stenosis and bilateral ateral recess stenosis. Mild bilateral foraminal stenosis. CT September 2020 Moderate spinal stenosis at L2-3 as demonstrated on the priorMRI.    Patient Stated Goals  Walk without a cane; feel more steady on feet    Pain Onset  More than a month ago       THEREX -NuStep L3 67minsseat 4 cuing to keep SPM over 60 - Squat with chair behind for safety with 7# DB in front 3x 10 with increased cuing needed to prevent forward lean SLS 30sec unilateral UE support bilat; with 91finger support 30sec bilat; unable without UE support; L tandem 30sec with 1x needing to hold rail; R tandem 30sec no UE support, increased postural sway Narrow BOS and semi tandem not challenging - Step up onto 6in 3x 8 bilat with cuing for heel tap to floor for increased eccentric control with good carry over - Leg press 35# 4x 10 with min cuing for full ROM with good carry over  HEP review of the following  Access Code: [email or web address]: [email or web address]:  03/24/2021Prepared by: Eliya Bubar MillerExercises  Sit to Stand with Armchair - 1 x daily - 2 x weekly - 6-10 reps - 3 sets  Mini Squat with Counter Support - 1 x daily - 2 x weekly - 10 reps - 3 sets  Standing Hip Abduction with Resistance at Ankles and Counter Support - 1 x daily - 2 x weekly - 10 reps - 3 sets  Alternating Reverse Lunge - 1 x daily - 2 x weekly - 6 reps - 3 sets  Single Leg Heel Raise with Counter Support - 1 x daily - 2 x weekly - 10 reps - 3 sets SLS with UE support 3x 30sec every day                        PT  Education - 11/23/19 1441    Education Details  Therex form    Person(s) Educated  Patient    Methods  Explanation;Demonstration;Tactile cues;Verbal cues    Comprehension  Verbalized understanding;Returned demonstration;Verbal cues required;Tactile cues required       PT Short Term Goals - 10/11/19 1355      PT SHORT TERM GOAL #1   Title  Pt will be independent with HEP in order to improve strength and decrease back pain in order to improve pain-free function at home and work.    Baseline  08/30/19 HEP given; 10/11/2019 HEP great adherence    Time  4    Period  Weeks    Status  Achieved        PT Long Term Goals - 10/26/19 1354      PT LONG TERM GOAL #1   Title  Pt will increase 10MWT to 1.2 m/s in order to be classified as a community ambulation.    Baseline  10/26/2019 .70 m/s    Time  8    Period  Weeks    Status  Revised      PT LONG TERM GOAL #2   Title  In 8 weeks pt will increase SLS to 10.6 sec without UE support in order to show age-related norms for SL balance.    Baseline  10/26/2019 R/L 6.5,5.4 sec    Time  8    Period  Weeks    Status  New      PT LONG TERM GOAL #3   Title  In 8 weeks, pt will independently transfer from supine to sitting in order to improve fuctional bed mobility.    Baseline  10/26/2019 needs min assist from PT with cueing, needs rail for at home    Time  8    Period  Weeks    Status  New       PT LONG TERM GOAL #4   Title  In 8 weeks, pt will increase FOTO score to 51 in order to demonstrate predicted functional improvment for ADLs.    Baseline  10/26/2019 37    Time  8    Period  Weeks    Status  On-going            Plan - 11/23/19 1513    Clinical Impression Statement  PT continued therex progression for static balance and LE strengthening with good success. Patinet is able to complete all therex with proper technique following cuing and education. PT reviewed HEP with patinet to ensure compliance which patient is able to verbalize understanding of. PT will continue progression as able.    Personal Factors and Comorbidities  Comorbidity 1;Age;Comorbidity 2;Fitness;Past/Current Experience;Time since onset of injury/illness/exacerbation;Behavior Pattern    Comorbidities  HTN; bilat THA    Examination-Activity Limitations  Bathing;Squat;Lift;Stairs;Locomotion Level;Carry;Transfers    Examination-Participation Restrictions  Church;Cleaning;Community Activity;Laundry    Stability/Clinical Decision Making  Evolving/Moderate complexity    Clinical Decision Making  Moderate    Rehab Potential  Good    PT Frequency  2x / week    PT Duration  8 weeks    PT Treatment/Interventions  ADLs/Self Care Home Management;Electrical Stimulation;Therapeutic activities;Patient/family education;Taping;Therapeutic exercise;DME Instruction;Aquatic Therapy;Moist Heat;Stair training;Traction;Ultrasound;Cryotherapy;Functional mobility training;Neuromuscular re-education;Manual techniques;Dry needling;Passive range of motion    PT Next Visit Plan  SLS    PT Home Exercise Plan  [id number]; updated to add reverse lunge, SL balance at counter    Consulted and Agree with Plan of Care  Patient  Patient will benefit from skilled therapeutic intervention in order to improve the following deficits and impairments:  Pain, Improper body mechanics, Increased fascial restricitons, Abnormal gait,  Decreased mobility, Increased muscle spasms, Postural dysfunction, Decreased activity tolerance, Decreased endurance, Decreased range of motion, Decreased strength, Decreased balance, Impaired flexibility, Difficulty walking  Visit Diagnosis: Other abnormalities of gait and mobility  History of falling  Chronic midline low back pain without sciatica     Problem List Patient Active Problem List   Diagnosis Date Noted  . Chronic low back pain 02/15/2019  . Difficulty walking 02/09/2019  . Acute pain of right shoulder 08/10/2018  . Foot drop 11/23/2017  . Status post shoulder replacement 11/20/2017  . Postmenopausal estrogen deficiency 10/15/2017  . Localized osteoarthritis of right shoulder 10/15/2017  . Difficulty sleeping 10/15/2017  . Acute cystitis without hematuria 07/20/2015  . Bilateral knee pain 06/23/2014  . Lumbar radiculopathy, chronic 06/23/2014  . Moderate aortic stenosis 11/03/2013  . Right bundle branch block 05/04/2013  . Hyperlipidemia 06/29/2012  . GERD (gastroesophageal reflux disease) 07/31/2011  . Hypertension 07/31/2011   Shelton Silvas PT, DPT Shelton Silvas 11/23/2019, 3:18 PM  [facility] [address], [place], [address] Phone: [phone]   Fax:  [phone]  Name: Robin Alexander MRN: [id number] Date of Birth: 11/01/1932

## 2019-11-25 ENCOUNTER — Ambulatory Visit: Payer: PPO | Admitting: Physical Therapy

## 2019-11-29 ENCOUNTER — Encounter: Payer: Self-pay | Admitting: Physical Therapy

## 2019-11-29 ENCOUNTER — Ambulatory Visit: Payer: PPO | Admitting: Physical Therapy

## 2019-11-29 ENCOUNTER — Other Ambulatory Visit: Payer: Self-pay

## 2019-11-29 DIAGNOSIS — Z9181 History of falling: Secondary | ICD-10-CM

## 2019-11-29 DIAGNOSIS — R2689 Other abnormalities of gait and mobility: Secondary | ICD-10-CM | POA: Diagnosis not present

## 2019-11-29 DIAGNOSIS — G8929 Other chronic pain: Secondary | ICD-10-CM

## 2019-11-29 NOTE — Therapy (Signed)
[facility] [address] [place], [address] Phone: [phone]   Fax:  [phone]  Physical Therapy Treatment  Patient Details  Name: Robin Alexander MRN: [id number] Date of Birth: 12-19-1932 No data recorded  Encounter Date: 11/29/2019  PT End of Session - 11/29/19 1053    Visit Number  18    Number of Visits  29    Date for PT Re-Evaluation  12/09/19    PT Start Time  [id number]    PT Stop Time  1115    PT Time Calculation (min)  28 min    Activity Tolerance  Patient tolerated treatment well    Behavior During Therapy  [facility] for tasks assessed/performed       Past Medical History:  Diagnosis Date  . Abnormal CT scan, chest 03/20/2014  . Anemia   . Arthritis    shoulders  . Chicken pox   . Cold agglutinin disease   . Complication of anesthesia 2016   slow to wake up after anesthesia  . Dyspnea    uses inhaler when she gets bronchitis  . GERD (gastroesophageal reflux disease)   . GI bleed 03/16/2018  . Heart murmur   . High cholesterol   . HTN (hypertension)   . Multiple duodenal ulcers   . Pneumonia    spring 2015    Past Surgical History:  Procedure Laterality Date  . ABDOMINAL HYSTERECTOMY     in 50's  . ANTERIOR LAT LUMBAR FUSION Right 02/28/2015   Procedure: ANTERIOR LATERAL LUMBAR FUSION 1 LEVEL;  Surgeon: Phylliss Bob, MD;  Location: [facility];  Service: Orthopedics;  Laterality: Right;  Right sided lumbar 3-4 lateral interbody fusion  . APPENDECTOMY  1950  . BACK SURGERY     rod in back  . BICEPT TENODESIS Right 11/20/2017   Procedure: BICEPS TENODESIS;  Surgeon: Leim Fabry, MD;  Location: [facility];  Service: Orthopedics;  Laterality: Right;  . CATARACT EXTRACTION W/ INTRAOCULAR LENS  IMPLANT, BILATERAL Bilateral 2011  . CESAREAN SECTION     2  . COLONOSCOPY WITH PROPOFOL N/A 03/03/2018   Procedure: COLONOSCOPY WITH PROPOFOL;  Surgeon: Jonathon Bellows, MD;  Location: [facility] END[facility]COPY;  Service:  Gastroenterology;  Laterality: N/A;  . ESOPHAGOGASTRODUODENOSCOPY (EGD) WITH PROPOFOL N/A 03/03/2018   Procedure: ESOPHAGOGASTRODUODENOSCOPY (EGD) WITH PROPOFOL;  Surgeon: Jonathon Bellows, MD;  Location[facility];  Service: Gastroenterology;  Laterality: N/A;  . ESOPHAGOGASTRODUODENOSCOPY (EGD) WITH PROPOFOL N/A 04/26/2018   Procedure: ESOPHAGOGASTRODUODENOSCOPY (EGD) WITH PROPOFOL;  Surgeon: Jonathon Bellows, MD;  Location: [facility] ENDOSCOPY;  Service: Gastroenterology;  Laterality: N/A;  . JOINT REPLACEMENT    . REVERSE SHOULDER ARTHROPLASTY Right 11/20/2017   Procedure: REVERSE SHOULDER ARTHROPLASTY;  Surgeon: Leim Fabry, MD;  Location: [facility];  Service: Orthopedics;  Laterality: Right;  . ROTATOR CUFF REPAIR Right    right shoulder   10 YRS  . TOTAL HIP ARTHROPLASTY Bilateral 2005, 2006   Bilateral, [place]    There were no vitals filed for this visit.  Subjective Assessment - 11/29/19 1051    Subjective  Reports no pain, following putting on a pain patch today. Compliance with new HEP.    Pertinent History  Patient is an 86 year old female presenting with chronic LBP. Patient reports 2 years ago she had a fall at church that started her LBP and bilat LE weakness.Patient had a fusion of L4-S1 2016. Has had injections for pain over the past 2 years and they "used  to work" but are no longer helping her pain. Reports LBP is midline without redicular symptoms into LEs; denies numbness/tingling. Reports pain is sharp in the middle of middle of the spine following prolonged ambulation and standing. Rest helps her pain. Worst: 8/10 best: 0/10. Patient reports she started using SPC about 6 months ago when she noticed she had a "limp" when she walks. She lives with her husband who is physically independent, reports indpendence with ADLs, does have grab bars and shower seat in bathroom but does not use them. Has has 3 floors to her home, but stays on 1 level. Does have stairs in/out of garage (6 steps) with  bilat handrails that she reports no issues using. Patient has a rail on the side of her bed that she says assists her in supine<> sit transfers.  She can drives but prefers to have her husband. She enjoys working in her church and visit her friends, and wants to be able to feel comfortable walking without her cane to complete these community activies. Pt denies N/V, B&B changes, unexplained weight fluctuation, saddle paresthesia, fever, night sweats, or unrelenting night pain at this time.    Limitations  House hold activities;Lifting;Standing;Walking    How long can you sit comfortably?  unlimited    How long can you stand comfortably?  5-70mins    How long can you walk comfortably?  66mins    Diagnostic tests  MRI July 2020: Diffuse lumbar spine spondylosis as described above most severe at L2-3. At L2-3 there is a broad-based disc bulge. Moderate bilateral facet arthropathy. Moderate-severe spinal stenosis and bilateral ateral recess stenosis. Mild bilateral foraminal stenosis. CT September 2020 Moderate spinal stenosis at L2-3 as demonstrated on the priorMRI.    Patient Stated Goals  Walk without a cane; feel more steady on feet       THEREX -NuStep L66minsseat 4 cuing to keep SPM over 60, decently able - Squat with chair behind for safety with 7# DB in front3x 10 with increased cuing needed to prevent forward lean SLS 30sec unilateral UE support bilat; with 48finger support 30sec bilat; without UE support RLE: 3sec LLE 4sec supervision for safety - Slider circles in mini squat position 3x 10/9/8 each LE with minimal unilateral support on bar, cuing for large circle with good carry over;l rest breaks in standing  - Leg press 35#3x 10with min cuing for full ROM with good carry over                        PT Education - 11/29/19 1052    Education Details  therex form    Person(s) Educated  Patient    Methods  Explanation;Demonstration;Verbal cues    Comprehension   Verbalized understanding;Returned demonstration;Verbal cues required       PT Short Term Goals - 10/11/19 1355      PT SHORT TERM GOAL #1   Title  Pt will be independent with HEP in order to improve strength and decrease back pain in order to improve pain-free function at home and work.    Baseline  08/30/19 HEP given; 10/11/2019 HEP great adherence    Time  4    Period  Weeks    Status  Achieved        PT Long Term Goals - 10/26/19 1354      PT LONG TERM GOAL #1   Title  Pt will increase 10MWT to 1.2 m/s in order to be classified as  a community ambulation.    Baseline  10/26/2019 .70 m/s    Time  8    Period  Weeks    Status  Revised      PT LONG TERM GOAL #2   Title  In 8 weeks pt will increase SLS to 10.6 sec without UE support in order to show age-related norms for SL balance.    Baseline  10/26/2019 R/L 6.5,5.4 sec    Time  8    Period  Weeks    Status  New      PT LONG TERM GOAL #3   Title  In 8 weeks, pt will independently transfer from supine to sitting in order to improve fuctional bed mobility.    Baseline  10/26/2019 needs min assist from PT with cueing, needs rail for at home    Time  8    Period  Weeks    Status  New      PT LONG TERM GOAL #4   Title  In 8 weeks, pt will increase FOTO score to 51 in order to demonstrate predicted functional improvment for ADLs.    Baseline  10/26/2019 37    Time  8    Period  Weeks    Status  On-going            Plan - 11/29/19 1105    Clinical Impression Statement  PT continued therex progression for increased LE strength and balance, increasing standing tolerance demand, as patient mentions one of the things she would like to do is to be able to stand to bake a cake. Patietn is able to comply with all cuing for proper technique with good carry over. PT will continue progression as able.    Personal Factors and Comorbidities  Comorbidity 1;Age;Comorbidity 2;Fitness;Past/Current Experience;Time since onset of  injury/illness/exacerbation;Behavior Pattern    Comorbidities  HTN; bilat THA    Examination-Activity Limitations  Bathing;Squat;Lift;Stairs;Locomotion Level;Carry;Transfers    Examination-Participation Restrictions  Church;Cleaning;Community Activity;Laundry    Stability/Clinical Decision Making  Evolving/Moderate complexity    Clinical Decision Making  Moderate    Rehab Potential  Good    PT Frequency  2x / week    PT Duration  8 weeks    PT Treatment/Interventions  ADLs/Self Care Home Management;Electrical Stimulation;Therapeutic activities;Patient/family education;Taping;Therapeutic exercise;DME Instruction;Aquatic Therapy;Moist Heat;Stair training;Traction;Ultrasound;Cryotherapy;Functional mobility training;Neuromuscular re-education;Manual techniques;Dry needling;Passive range of motion    PT Next Visit Plan  SLS    PT Home Exercise Plan  [id number]; updated to add reverse lunge, SL balance at counter    Consulted and Agree with Plan of Care  Patient       Patient will benefit from skilled therapeutic intervention in order to improve the following deficits and impairments:  Pain, Improper body mechanics, Increased fascial restricitons, Abnormal gait, Decreased mobility, Increased muscle spasms, Postural dysfunction, Decreased activity tolerance, Decreased endurance, Decreased range of motion, Decreased strength, Decreased balance, Impaired flexibility, Difficulty walking  Visit Diagnosis: Other abnormalities of gait and mobility  History of falling  Chronic midline low back pain without sciatica     Problem List Patient Active Problem List   Diagnosis Date Noted  . Chronic low back pain 02/15/2019  . Difficulty walking 02/09/2019  . Acute pain of right shoulder 08/10/2018  . Foot drop 11/23/2017  . Status post shoulder replacement 11/20/2017  . Postmenopausal estrogen deficiency 10/15/2017  . Localized osteoarthritis of right shoulder 10/15/2017  . Difficulty sleeping  10/15/2017  . Acute cystitis without hematuria 07/20/2015  . Bilateral knee  pain 06/23/2014  . Lumbar radiculopathy, chronic 06/23/2014  . Moderate aortic stenosis 11/03/2013  . Right bundle branch block 05/04/2013  . Hyperlipidemia 06/29/2012  . GERD (gastroesophageal reflux disease) 07/31/2011  . Hypertension 07/31/2011   Shelton Silvas PT, DPT Shelton Silvas 11/29/2019, 11:14 AM  [facility] [address], [place], [address] Phone: [phone]   Fax:  [phone]  Name: SAHIB SHANKLES MRN: [id number] Date of Birth: 12-Jun-1933

## 2019-12-01 ENCOUNTER — Emergency Department: Payer: PPO

## 2019-12-01 ENCOUNTER — Emergency Department
Admission: EM | Admit: 2019-12-01 | Discharge: 2019-12-01 | Disposition: A | Discharge status: Home / Self Care | Payer: PPO | Attending: Emergency Medicine | Admitting: Emergency Medicine

## 2019-12-01 ENCOUNTER — Ambulatory Visit: Payer: PPO | Admitting: Physical Therapy

## 2019-12-01 ENCOUNTER — Encounter: Payer: Self-pay | Admitting: Emergency Medicine

## 2019-12-01 ENCOUNTER — Other Ambulatory Visit: Payer: Self-pay

## 2019-12-01 DIAGNOSIS — R0789 Other chest pain: Secondary | ICD-10-CM | POA: Insufficient documentation

## 2019-12-01 DIAGNOSIS — R0781 Pleurodynia: Secondary | ICD-10-CM

## 2019-12-01 DIAGNOSIS — Z79899 Other long term (current) drug therapy: Secondary | ICD-10-CM | POA: Insufficient documentation

## 2019-12-01 DIAGNOSIS — I10 Essential (primary) hypertension: Secondary | ICD-10-CM | POA: Insufficient documentation

## 2019-12-01 DIAGNOSIS — Z87891 Personal history of nicotine dependence: Secondary | ICD-10-CM | POA: Insufficient documentation

## 2019-12-01 DIAGNOSIS — S299XXA Unspecified injury of thorax, initial encounter: Secondary | ICD-10-CM | POA: Diagnosis not present

## 2019-12-01 MED ORDER — MELOXICAM 7.5 MG PO TABS: 7.5000 mg | ORAL_TABLET | Freq: Every day | ORAL | 0 refills | Status: DC

## 2019-12-01 NOTE — Discharge Instructions (Signed)
Please follow-up with your primary care provider if you are not improving over the week.  I have prescribed an anti-inflammatory that should help with your pain.  You may also take Tylenol.  Return to the emergency department for symptoms of change or worsen if you are unable to schedule appointment.
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
## 2019-12-05 DIAGNOSIS — R0781 Pleurodynia: Secondary | ICD-10-CM | POA: Diagnosis not present

## 2019-12-06 ENCOUNTER — Ambulatory Visit: Payer: PPO | Admitting: Physical Therapy

## 2019-12-08 ENCOUNTER — Ambulatory Visit: Payer: PPO | Admitting: Physical Therapy

## 2019-12-14 ENCOUNTER — Ambulatory Visit: Payer: PPO | Admitting: Physical Therapy

## 2019-12-19 DIAGNOSIS — I10 Essential (primary) hypertension: Secondary | ICD-10-CM | POA: Diagnosis not present

## 2019-12-19 DIAGNOSIS — N1832 Chronic kidney disease, stage 3b: Secondary | ICD-10-CM | POA: Diagnosis not present

## 2020-01-17 DIAGNOSIS — G894 Chronic pain syndrome: Secondary | ICD-10-CM | POA: Diagnosis not present

## 2020-01-18 ENCOUNTER — Other Ambulatory Visit: Payer: Self-pay | Admitting: Neurosurgery

## 2020-01-18 DIAGNOSIS — G894 Chronic pain syndrome: Secondary | ICD-10-CM

## 2020-01-31 ENCOUNTER — Ambulatory Visit
Admission: RE | Admit: 2020-01-31 | Discharge: 2020-01-31 | Disposition: A | Discharge status: Home / Self Care | Payer: PPO | Source: Ambulatory Visit | Attending: Neurosurgery | Admitting: Neurosurgery

## 2020-01-31 ENCOUNTER — Other Ambulatory Visit: Payer: Self-pay

## 2020-01-31 DIAGNOSIS — G894 Chronic pain syndrome: Secondary | ICD-10-CM | POA: Insufficient documentation

## 2020-01-31 DIAGNOSIS — M546 Pain in thoracic spine: Secondary | ICD-10-CM | POA: Diagnosis not present

## 2020-02-08 DIAGNOSIS — G894 Chronic pain syndrome: Secondary | ICD-10-CM | POA: Diagnosis not present

## 2020-02-15 ENCOUNTER — Ambulatory Visit: Payer: Medicare Other | Admitting: Family Medicine

## 2020-02-17 ENCOUNTER — Ambulatory Visit (INDEPENDENT_AMBULATORY_CARE_PROVIDER_SITE_OTHER): Payer: PPO

## 2020-02-17 ENCOUNTER — Other Ambulatory Visit: Payer: Self-pay

## 2020-02-17 DIAGNOSIS — I350 Nonrheumatic aortic (valve) stenosis: Secondary | ICD-10-CM | POA: Diagnosis not present

## 2020-02-20 ENCOUNTER — Encounter: Payer: Self-pay | Admitting: Family

## 2020-02-20 ENCOUNTER — Ambulatory Visit: Payer: PPO | Admitting: Family

## 2020-02-20 ENCOUNTER — Telehealth: Payer: Self-pay

## 2020-02-20 ENCOUNTER — Other Ambulatory Visit: Payer: Self-pay

## 2020-02-20 VITALS — BP 136/56 | HR 73 | Ht 60.0 in | Wt 146.5 lb

## 2020-02-20 DIAGNOSIS — I10 Essential (primary) hypertension: Secondary | ICD-10-CM | POA: Diagnosis not present

## 2020-02-20 NOTE — Patient Instructions (Addendum)
Medication Instructions:  Your physician recommends that you continue on your current medications as directed. Please refer to the Current Medication list given to you today.  *If you need a refill on your cardiac medications before your next appointment, please call your pharmacy*   Lab Work: None Ordered If you have labs (blood work) drawn today and your tests are completely normal, you will receive your results only by:  [facility] (if you have MyChart) OR  A paper copy in the mail If you have any lab test that is abnormal or we need to change your treatment, we will call you to review the results.   Testing/Procedures: None Ordered.   Follow-Up: At [facility], you and your health needs are our priority.  As part of our continuing mission to provide you with exceptional heart care, we have created designated Provider Care Teams.  These Care Teams include your primary Cardiologist (physician) and Advanced Practice Providers (APPs -  Physician Assistants and Nurse Practitioners) who all work together to provide you with the care you need, when you need it.  We recommend signing up for the patient portal called "MyChart".  Sign up information is provided on this After Visit Summary.  MyChart is used to connect with patients for Virtual Visits (Telemedicine).  Patients are able to view lab/test results, encounter notes, upcoming appointments, etc.  Non-urgent messages can be sent to your provider as well.   To learn more about what you can do with MyChart, go to [email or web address].    Your next appointment:   Establish care in [place]   The format for your next appointment:   In Person  Provider:   N/A   Other Instructions N/A

## 2020-02-20 NOTE — Telephone Encounter (Signed)
Results delivered in person with provider. appt today.

## 2020-02-20 NOTE — Progress Notes (Signed)
Office Visit    Patient Name: Robin Alexander Date of Encounter: 02/20/2020  Primary Care Provider:  Leone Haven, MD Primary Cardiologist:  Kathlyn Sacramento, MD Electrophysiologist:  None   Chief Complaint    Robin Alexander is a 86 y.o. female with a hx of AV stenosis (mod-severe by echo 02/17/20), HTN, HLD, GI bleed, incidentally noted cold agglutinin/cryblobulin disease, GERD, RBBB presents today for follow up of AS, HTN   Past Medical History    Past Medical History:  Diagnosis Date  . Abnormal CT scan, chest 03/20/2014  . Anemia   . Arthritis    shoulders  . Chicken pox   . Cold agglutinin disease   . Complication of anesthesia 2016   slow to wake up after anesthesia  . Dyspnea    uses inhaler when she gets bronchitis  . GERD (gastroesophageal reflux disease)   . GI bleed 03/16/2018  . Heart murmur   . High cholesterol   . HTN (hypertension)   . Multiple duodenal ulcers   . Pneumonia    spring 2015   Past Surgical History:  Procedure Laterality Date  . ABDOMINAL HYSTERECTOMY     in 50's  . ANTERIOR LAT LUMBAR FUSION Right 02/28/2015   Procedure: ANTERIOR LATERAL LUMBAR FUSION 1 LEVEL;  Surgeon: Phylliss Bob, MD;  Location: [facility];  Service: Orthopedics;  Laterality: Right;  Right sided lumbar 3-4 lateral interbody fusion  . APPENDECTOMY  1950  . BACK SURGERY     rod in back  . BICEPT TENODESIS Right 11/20/2017   Procedure: BICEPS TENODESIS;  Surgeon: Leim Fabry, MD;  Location: [facility];  Service: Orthopedics;  Laterality: Right;  . CATARACT EXTRACTION W/ INTRAOCULAR LENS  IMPLANT, BILATERAL Bilateral 2011  . CESAREAN SECTION     2  . COLONOSCOPY WITH PROPOFOL N/A 03/03/2018   Procedure: COLONOSCOPY WITH PROPOFOL;  Surgeon: Jonathon Bellows, MD;  Location: [facility];  Service: Gastroenterology;  Laterality: N/A;  . ESOPHAGOGASTRODUODENOSCOPY (EGD) WITH PROPOFOL N/A 03/03/2018   Procedure: ESOPHAGOGASTRODUODENOSCOPY (EGD) WITH PROPOFOL;  Surgeon: Jonathon Bellows, MD;  Location: [facility] END[facility];  Service: Gastroenterology;  Laterality: N/A;  . ESOPHAGOGASTRODUODENOSCOPY (EGD) WITH PROPOFOL N/A 04/26/2018   Procedure: ESOPHAGOGASTRODUODENOSCOPY (EGD) WITH PROPOFOL;  Surgeon: Jonathon Bellows, MD;  Location: [facility] ENDOSCOPY;  Service: Gastroenterology;  Laterality: N/A;  . JOINT REPLACEMENT    . REVERSE SHOULDER ARTHROPLASTY Right 11/20/2017   Procedure: REVERSE SHOULDER ARTHROPLASTY;  Surgeon: Leim Fabry, MD;  Location: [facility];  Service: Orthopedics;  Laterality: Right;  . ROTATOR CUFF REPAIR Right    right shoulder   10 YRS  . TOTAL HIP ARTHROPLASTY Bilateral 2005, 2006   Bilateral, [place]    Allergies  Allergies  Allergen Reactions  . Adhesive [Tape]     Paper tape okay  . Codeine Itching    History of Present Illness    Robin Alexander is a 86 y.o. female with a hx of AV stenosis (mod-severe by echo 02/17/20), HTN, HLD, GI bleed, incidentally noted cold agglutinin/cryblobulin disease, GERD, RBBB. She was last seen 03/15/19 by Christell Faith, PA.  [name] had a previous treadmill stress test in 2014 with no evidence of ischemia.  Echo 10/2017 with normal LVEF, stable moderate AS with mean gradient 20 mmHg, mild to moderate mitral stenosis, mild MR.  Underwent shoulder surgery in 2019 with postoperative complications including GI bleed requiring admission and transfusion.  GI evaluation revealed bleeding gastric ulcer which was cauterized with almost complete resolution of  anemia.  She was seen in clinic 09/2018 doing well from cardiac perspective.  Follow-up echo delayed until 03/2019 due to the COVID-19 pandemic showing LVEF 60-65%, normal wall motion, normal RV systolic function, normal RV cavity size, mildly elevated PASP 37.3 mmHg, severely dilated LA, moderate MR with mild to moderate mitral stenosis, tricuspid aortic valve with moderate stenosis with a mean gradient of 30 mmHg and valve area of 1.25 cm grade, aortic root and ascending aorta  are normal in size and structure.  Seen in clinic 03/2019 with no acute cardiac complaints.  She had an upcoming MRI of her back and was following with neurosurgery. She was recommended for repeat echo in 1 year. Echo 01/2020 with LVEF 60 to 65%, no wall motion abnormalities, indeterminate diastolic parameters, RV normal size and function with moderately elevated PASP 49.9 mmHg, LA moderately dilated, moderate mitral regurgitation, mild to moderate mitral stenosis with mean mitral valve gradient 9 mmHg, tricuspid valve regurgitation mild to moderate, mild AI with moderate to severe aortic valve stenosis with a valve area 0.95 cm, AV mean gradient 35 mmHg.  Reports feeling overall well. Initial BP elevated SBP 150, but improved to 136/56. Does endorse some stress associated with moving and packing up their home of many years. Moving this Saturday to [place] to live with their some.  Reviewed echocardiogram results in depth. In discussing elevated PASP and possible evaluation for sleep apnea, her husband tells me she does not snore and she tells me she feels well rested on waking and rarely needs an afternoon nap.   Reports no shortness of breath nor dyspnea on exertion. Reports no chest pain, pressure, or tightness. No edema, orthopnea, PND. Reports no palpitations. No lightheadedness, dizziness, near syncope, syncope.   EKGs/Labs/Other Studies Reviewed:   The following studies were reviewed today:  Echo 02/17/20  1. Left ventricular ejection fraction, by estimation, is 60 to 65%. The  left ventricle has normal function. The left ventricle has no regional  wall motion abnormalities. Left ventricular diastolic parameters are  indeterminate.   2. Right ventricular systolic function is normal. The right ventricular  size is normal. There is moderately elevated pulmonary artery systolic  pressure. The estimated right ventricular systolic pressure is 39.0 mmHg.   3. Left atrial size was moderately  dilated.   4. The mitral valve is rheumatic. Moderate mitral valve regurgitation.  Mild to moderate mitral stenosis. The mean mitral valve gradient is 9.0  mmHg.   5. Tricuspid valve regurgitation is mild to moderate.   6. The aortic valve is normal in structure. Aortic valve regurgitation is  mild. Moderate to severe aortic valve stenosis. Aortic valve area, by VTI  measures 0.95 cm. Aortic valve mean gradient measures 35.0 mmHg. Aortic  valve Vmax measures 3.77 m/s.  EKG:  EKG is ordered today.  The ekg ordered today demonstrates SR 73 bpm with LA enlargement, RBBB, and LVH. Stable compared to previous.   Recent Labs: 03/10/2019: Hemoglobin 12.5; Platelets 196  Recent Lipid Panel    Component Value Date/Time   CHOL 141 02/15/2019 1533   TRIG 130.0 02/15/2019 1533   HDL 50.40 02/15/2019 1533   CHOLHDL 3 02/15/2019 1533   VLDL 26.0 02/15/2019 1533   LDLCALC 64 02/15/2019 1533    Home Medications   Current Meds  Medication Sig  . Acetaminophen (TYLENOL ARTHRITIS PAIN PO) Take by mouth 2 (two) times daily as needed.  Marland Kitchen amLODipine-benazepril (LOTREL) 5-20 MG capsule Take 1 capsule by mouth daily.  Marland Kitchen  docusate sodium (COLACE) 100 MG capsule Take 100 mg by mouth daily.  . Multiple Vitamin (MULTIVITAMIN WITH MINERALS) TABS tablet Take 1 tablet by mouth daily. CENTRUM SILVER  . Omega-3 Fatty Acids (FISH OIL) 1000 MG CAPS Take 1,000-2,000 mg by mouth daily. TAKE 2 CAPSULES IN THE MORNING & 1 CAPSULE AT NIGHT  . omeprazole (PRILOSEC) 20 MG capsule Take 1 capsule (20 mg total) by mouth daily.  . simvastatin (ZOCOR) 20 MG tablet Take 1 tablet (20 mg total) by mouth every evening.   Current Facility-Administered Medications for the 02/20/20 encounter (Office Visit) with Loel Dubonnet, NP  Medication  . albuterol (PROVENTIL) (2.5 MG/3ML) 0.083% nebulizer solution 2.5 mg     Review of Systems       Review of Systems  Constitutional: Negative for chills, fever and malaise/fatigue.    Cardiovascular: Negative for chest pain, dyspnea on exertion, leg swelling, near-syncope, orthopnea, palpitations and syncope.  Respiratory: Negative for cough, shortness of breath and wheezing.   Gastrointestinal: Negative for nausea and vomiting.  Neurological: Negative for dizziness, light-headedness and weakness.   All other systems reviewed and are otherwise negative except as noted above.  Physical Exam    VS:  BP (!) 136/56   Pulse 73   Ht 5' (1.524 m)   Wt 146 lb 8 oz (66.5 kg)   SpO2 96%   BMI 28.61 kg/m  , BMI Body mass index is 28.61 kg/m. GEN: Well nourished, well developed, in no acute distress. HEENT: normal. Neck: Supple, no JVD, carotid bruits, or masses. Cardiac: RRR, no  rubs, or gallops. No clubbing, cyanosis, edema.  Radials/DP/PT 2+ and equal bilaterally. Gr 3/6 harsh midsystolic murmur at RUSB. High pitched blowing holosystolic murmur 3/6 at apex.  Respiratory:  Respirations regular and unlabored, clear to auscultation bilaterally. GI: Soft, nontender, nondistended, BS + x 4. MS: No deformity or atrophy. Skin: Warm and dry, no rash. Varicose veins noted to bilateral LE>  Neuro:  Strength and sensation are intact. Psych: Normal affect.   Assessment & Plan    1. Moderate to severe aortic stenosis by echo 01/2020 - Asymptomatic with no near syncope, chest pain, shortness of breath. Recommend optimal BP and volume control. She was educated on symptoms to report. Recommend repeat echocardiogram 01/2021.  2. Elevated PASP - Moderately elevated PASP by echo 01/2020 recommend considering sleep study to evaluation for underlying sleep apnea with new cardiologist post move.  3. Mild to moderate mitral stenosis of mild MR - Asymptomatic. Recommend repeat echocardiogram 01/2021. 4. HLD - 01/2019 LDL 64. Continue Simvastatin 20mg  daily.  5. HTN - Has been managed by her PCP. BP mildly elevated though in setting of stress with upcoming move. Encouraged to monitor a few times  per week and take to her upcoming appointment with her new PCP in [place]. Continue Amlodipine-Benazepril 5-20mg . If additional BP control needed, consider uptitration to 10-20mg  tablet.  Disposition: To establish cardiology care in [facility], NP 02/20/2020, 9:21 PM

## 2020-02-23 ENCOUNTER — Ambulatory Visit: Payer: PPO | Admitting: Student in an Organized Health Care Education/Training Program

## 2020-03-14 ENCOUNTER — Other Ambulatory Visit: Payer: PPO

## 2020-03-20 ENCOUNTER — Ambulatory Visit: Payer: PPO | Admitting: Nurse Practitioner

## 2020-07-16 ENCOUNTER — Telehealth: Payer: Self-pay | Admitting: Family Medicine

## 2020-07-16 NOTE — Telephone Encounter (Signed)
-----   Message from Salvatore Marvel sent at 07/16/2020  2:31 PM EST ----- Regarding: REMOVE PCP Pt moved to [place].  Thanks a bunch, [employer]

## 2020-07-30 ENCOUNTER — Ambulatory Visit: Payer: Medicare Other
